# Patient Record
Sex: Male | Born: 1935
Health system: Southern US, Community
[De-identification: ages and names within clinical notes are randomized; demographics above are authoritative.]

## PROBLEM LIST (undated history)

## (undated) DIAGNOSIS — R627 Adult failure to thrive: Secondary | ICD-10-CM

## (undated) DIAGNOSIS — F419 Anxiety disorder, unspecified: Secondary | ICD-10-CM

## (undated) DIAGNOSIS — I471 Supraventricular tachycardia, unspecified: Secondary | ICD-10-CM

## (undated) DIAGNOSIS — E870 Hyperosmolality and hypernatremia: Secondary | ICD-10-CM

## (undated) DIAGNOSIS — N139 Obstructive and reflux uropathy, unspecified: Secondary | ICD-10-CM

## (undated) DIAGNOSIS — Z978 Presence of other specified devices: Secondary | ICD-10-CM

## (undated) DIAGNOSIS — N390 Urinary tract infection, site not specified: Secondary | ICD-10-CM

## (undated) DIAGNOSIS — C801 Malignant (primary) neoplasm, unspecified: Secondary | ICD-10-CM

## (undated) DIAGNOSIS — R6521 Severe sepsis with septic shock: Secondary | ICD-10-CM

## (undated) DIAGNOSIS — R319 Hematuria, unspecified: Secondary | ICD-10-CM

## (undated) DIAGNOSIS — K219 Gastro-esophageal reflux disease without esophagitis: Secondary | ICD-10-CM

## (undated) DIAGNOSIS — F0390 Unspecified dementia without behavioral disturbance: Secondary | ICD-10-CM

## (undated) DIAGNOSIS — I509 Heart failure, unspecified: Secondary | ICD-10-CM

## (undated) DIAGNOSIS — E871 Hypo-osmolality and hyponatremia: Secondary | ICD-10-CM

## (undated) DIAGNOSIS — E46 Unspecified protein-calorie malnutrition: Secondary | ICD-10-CM

## (undated) DIAGNOSIS — R1310 Dysphagia, unspecified: Secondary | ICD-10-CM

## (undated) DIAGNOSIS — R5381 Other malaise: Secondary | ICD-10-CM

## (undated) DIAGNOSIS — F32A Depression, unspecified: Secondary | ICD-10-CM

## (undated) DIAGNOSIS — D649 Anemia, unspecified: Secondary | ICD-10-CM

## (undated) DIAGNOSIS — N368 Other specified disorders of urethra: Secondary | ICD-10-CM

## (undated) DIAGNOSIS — N189 Chronic kidney disease, unspecified: Secondary | ICD-10-CM

## (undated) HISTORY — PX: TONSILECTOMY, ADENOIDECTOMY, BILATERAL MYRINGOTOMY AND TUBES: SHX2538

---

## 2016-06-28 DIAGNOSIS — R030 Elevated blood-pressure reading, without diagnosis of hypertension: Secondary | ICD-10-CM | POA: Diagnosis not present

## 2016-06-28 DIAGNOSIS — E538 Deficiency of other specified B group vitamins: Secondary | ICD-10-CM | POA: Diagnosis not present

## 2016-06-28 DIAGNOSIS — Z6834 Body mass index (BMI) 34.0-34.9, adult: Secondary | ICD-10-CM | POA: Diagnosis not present

## 2016-06-28 DIAGNOSIS — R3915 Urgency of urination: Secondary | ICD-10-CM | POA: Diagnosis not present

## 2016-06-28 DIAGNOSIS — E782 Mixed hyperlipidemia: Secondary | ICD-10-CM | POA: Diagnosis not present

## 2016-10-18 DIAGNOSIS — I10 Essential (primary) hypertension: Secondary | ICD-10-CM | POA: Diagnosis not present

## 2016-10-18 DIAGNOSIS — Z23 Encounter for immunization: Secondary | ICD-10-CM | POA: Diagnosis not present

## 2016-10-18 DIAGNOSIS — Z6834 Body mass index (BMI) 34.0-34.9, adult: Secondary | ICD-10-CM | POA: Diagnosis not present

## 2016-10-18 DIAGNOSIS — E6609 Other obesity due to excess calories: Secondary | ICD-10-CM | POA: Diagnosis not present

## 2018-01-08 DIAGNOSIS — E875 Hyperkalemia: Secondary | ICD-10-CM | POA: Diagnosis present

## 2018-01-08 DIAGNOSIS — E7800 Pure hypercholesterolemia, unspecified: Secondary | ICD-10-CM | POA: Diagnosis present

## 2018-01-08 DIAGNOSIS — R338 Other retention of urine: Secondary | ICD-10-CM | POA: Diagnosis present

## 2018-01-08 DIAGNOSIS — R05 Cough: Secondary | ICD-10-CM | POA: Diagnosis not present

## 2018-01-08 DIAGNOSIS — R339 Retention of urine, unspecified: Secondary | ICD-10-CM | POA: Diagnosis not present

## 2018-01-08 DIAGNOSIS — N320 Bladder-neck obstruction: Secondary | ICD-10-CM | POA: Diagnosis present

## 2018-01-08 DIAGNOSIS — E872 Acidosis: Secondary | ICD-10-CM | POA: Diagnosis present

## 2018-01-08 DIAGNOSIS — R600 Localized edema: Secondary | ICD-10-CM | POA: Diagnosis not present

## 2018-01-08 DIAGNOSIS — G9349 Other encephalopathy: Secondary | ICD-10-CM | POA: Diagnosis present

## 2018-01-08 DIAGNOSIS — R410 Disorientation, unspecified: Secondary | ICD-10-CM | POA: Diagnosis not present

## 2018-01-08 DIAGNOSIS — N179 Acute kidney failure, unspecified: Secondary | ICD-10-CM | POA: Diagnosis not present

## 2018-01-08 DIAGNOSIS — R32 Unspecified urinary incontinence: Secondary | ICD-10-CM | POA: Diagnosis present

## 2018-01-08 DIAGNOSIS — R41841 Cognitive communication deficit: Secondary | ICD-10-CM | POA: Diagnosis not present

## 2018-01-08 DIAGNOSIS — M6281 Muscle weakness (generalized): Secondary | ICD-10-CM | POA: Diagnosis not present

## 2018-01-08 DIAGNOSIS — R5383 Other fatigue: Secondary | ICD-10-CM | POA: Diagnosis not present

## 2018-01-08 DIAGNOSIS — N401 Enlarged prostate with lower urinary tract symptoms: Secondary | ICD-10-CM | POA: Diagnosis present

## 2018-01-08 DIAGNOSIS — E538 Deficiency of other specified B group vitamins: Secondary | ICD-10-CM | POA: Diagnosis not present

## 2018-01-08 DIAGNOSIS — E876 Hypokalemia: Secondary | ICD-10-CM | POA: Diagnosis present

## 2018-01-08 DIAGNOSIS — G9340 Encephalopathy, unspecified: Secondary | ICD-10-CM | POA: Diagnosis not present

## 2018-01-08 DIAGNOSIS — Z9660 Presence of unspecified orthopedic joint implant: Secondary | ICD-10-CM | POA: Diagnosis present

## 2018-01-08 DIAGNOSIS — D649 Anemia, unspecified: Secondary | ICD-10-CM | POA: Diagnosis present

## 2018-01-08 DIAGNOSIS — I10 Essential (primary) hypertension: Secondary | ICD-10-CM | POA: Diagnosis present

## 2018-01-08 DIAGNOSIS — R2689 Other abnormalities of gait and mobility: Secondary | ICD-10-CM | POA: Diagnosis not present

## 2018-01-08 DIAGNOSIS — N400 Enlarged prostate without lower urinary tract symptoms: Secondary | ICD-10-CM | POA: Diagnosis not present

## 2018-01-08 DIAGNOSIS — R278 Other lack of coordination: Secondary | ICD-10-CM | POA: Diagnosis not present

## 2018-01-08 DIAGNOSIS — N1330 Unspecified hydronephrosis: Secondary | ICD-10-CM | POA: Diagnosis present

## 2018-01-08 DIAGNOSIS — E785 Hyperlipidemia, unspecified: Secondary | ICD-10-CM | POA: Diagnosis not present

## 2018-01-08 DIAGNOSIS — D72829 Elevated white blood cell count, unspecified: Secondary | ICD-10-CM | POA: Diagnosis not present

## 2018-01-08 DIAGNOSIS — R4182 Altered mental status, unspecified: Secondary | ICD-10-CM | POA: Diagnosis not present

## 2018-01-08 DIAGNOSIS — R944 Abnormal results of kidney function studies: Secondary | ICD-10-CM | POA: Diagnosis not present

## 2018-01-12 DIAGNOSIS — R5383 Other fatigue: Secondary | ICD-10-CM | POA: Diagnosis not present

## 2018-01-12 DIAGNOSIS — N481 Balanitis: Secondary | ICD-10-CM | POA: Diagnosis not present

## 2018-01-12 DIAGNOSIS — M6281 Muscle weakness (generalized): Secondary | ICD-10-CM | POA: Diagnosis not present

## 2018-01-12 DIAGNOSIS — N472 Paraphimosis: Secondary | ICD-10-CM | POA: Diagnosis not present

## 2018-01-12 DIAGNOSIS — R41841 Cognitive communication deficit: Secondary | ICD-10-CM | POA: Diagnosis not present

## 2018-01-12 DIAGNOSIS — D649 Anemia, unspecified: Secondary | ICD-10-CM | POA: Diagnosis not present

## 2018-01-12 DIAGNOSIS — R2689 Other abnormalities of gait and mobility: Secondary | ICD-10-CM | POA: Diagnosis not present

## 2018-01-12 DIAGNOSIS — N320 Bladder-neck obstruction: Secondary | ICD-10-CM | POA: Diagnosis not present

## 2018-01-12 DIAGNOSIS — Z79899 Other long term (current) drug therapy: Secondary | ICD-10-CM | POA: Diagnosis not present

## 2018-01-12 DIAGNOSIS — E872 Acidosis: Secondary | ICD-10-CM | POA: Diagnosis not present

## 2018-01-12 DIAGNOSIS — N390 Urinary tract infection, site not specified: Secondary | ICD-10-CM | POA: Diagnosis not present

## 2018-01-12 DIAGNOSIS — N19 Unspecified kidney failure: Secondary | ICD-10-CM | POA: Diagnosis not present

## 2018-01-12 DIAGNOSIS — N179 Acute kidney failure, unspecified: Secondary | ICD-10-CM | POA: Diagnosis not present

## 2018-01-12 DIAGNOSIS — H2703 Aphakia, bilateral: Secondary | ICD-10-CM | POA: Diagnosis not present

## 2018-01-12 DIAGNOSIS — T83511A Infection and inflammatory reaction due to indwelling urethral catheter, initial encounter: Secondary | ICD-10-CM | POA: Diagnosis not present

## 2018-01-12 DIAGNOSIS — N139 Obstructive and reflux uropathy, unspecified: Secondary | ICD-10-CM | POA: Diagnosis not present

## 2018-01-12 DIAGNOSIS — R338 Other retention of urine: Secondary | ICD-10-CM | POA: Diagnosis not present

## 2018-01-12 DIAGNOSIS — R5381 Other malaise: Secondary | ICD-10-CM | POA: Diagnosis not present

## 2018-01-12 DIAGNOSIS — M79674 Pain in right toe(s): Secondary | ICD-10-CM | POA: Diagnosis not present

## 2018-01-12 DIAGNOSIS — N401 Enlarged prostate with lower urinary tract symptoms: Secondary | ICD-10-CM | POA: Diagnosis not present

## 2018-01-12 DIAGNOSIS — E7800 Pure hypercholesterolemia, unspecified: Secondary | ICD-10-CM | POA: Diagnosis not present

## 2018-01-12 DIAGNOSIS — M21372 Foot drop, left foot: Secondary | ICD-10-CM | POA: Diagnosis not present

## 2018-01-12 DIAGNOSIS — R8290 Unspecified abnormal findings in urine: Secondary | ICD-10-CM | POA: Diagnosis not present

## 2018-01-12 DIAGNOSIS — R339 Retention of urine, unspecified: Secondary | ICD-10-CM | POA: Diagnosis not present

## 2018-01-12 DIAGNOSIS — M79675 Pain in left toe(s): Secondary | ICD-10-CM | POA: Diagnosis not present

## 2018-01-12 DIAGNOSIS — R531 Weakness: Secondary | ICD-10-CM | POA: Diagnosis not present

## 2018-01-12 DIAGNOSIS — R410 Disorientation, unspecified: Secondary | ICD-10-CM | POA: Diagnosis not present

## 2018-01-12 DIAGNOSIS — Z125 Encounter for screening for malignant neoplasm of prostate: Secondary | ICD-10-CM | POA: Diagnosis not present

## 2018-01-12 DIAGNOSIS — R944 Abnormal results of kidney function studies: Secondary | ICD-10-CM | POA: Diagnosis not present

## 2018-01-12 DIAGNOSIS — R278 Other lack of coordination: Secondary | ICD-10-CM | POA: Diagnosis not present

## 2018-01-12 DIAGNOSIS — N400 Enlarged prostate without lower urinary tract symptoms: Secondary | ICD-10-CM | POA: Diagnosis not present

## 2018-01-12 DIAGNOSIS — B351 Tinea unguium: Secondary | ICD-10-CM | POA: Diagnosis not present

## 2018-01-12 DIAGNOSIS — N1330 Unspecified hydronephrosis: Secondary | ICD-10-CM | POA: Diagnosis not present

## 2018-01-12 DIAGNOSIS — E876 Hypokalemia: Secondary | ICD-10-CM | POA: Diagnosis not present

## 2018-01-12 DIAGNOSIS — I10 Essential (primary) hypertension: Secondary | ICD-10-CM | POA: Diagnosis not present

## 2018-01-15 DIAGNOSIS — R5381 Other malaise: Secondary | ICD-10-CM | POA: Diagnosis not present

## 2018-01-15 DIAGNOSIS — N320 Bladder-neck obstruction: Secondary | ICD-10-CM | POA: Diagnosis not present

## 2018-01-15 DIAGNOSIS — D649 Anemia, unspecified: Secondary | ICD-10-CM | POA: Diagnosis not present

## 2018-01-15 DIAGNOSIS — N400 Enlarged prostate without lower urinary tract symptoms: Secondary | ICD-10-CM | POA: Diagnosis not present

## 2018-01-18 DIAGNOSIS — M79674 Pain in right toe(s): Secondary | ICD-10-CM | POA: Diagnosis not present

## 2018-01-18 DIAGNOSIS — M21372 Foot drop, left foot: Secondary | ICD-10-CM | POA: Diagnosis not present

## 2018-01-18 DIAGNOSIS — B351 Tinea unguium: Secondary | ICD-10-CM | POA: Diagnosis not present

## 2018-01-18 DIAGNOSIS — M79675 Pain in left toe(s): Secondary | ICD-10-CM | POA: Diagnosis not present

## 2018-01-19 DIAGNOSIS — H2703 Aphakia, bilateral: Secondary | ICD-10-CM | POA: Diagnosis not present

## 2018-01-22 DIAGNOSIS — N401 Enlarged prostate with lower urinary tract symptoms: Secondary | ICD-10-CM | POA: Diagnosis not present

## 2018-01-22 DIAGNOSIS — Z79899 Other long term (current) drug therapy: Secondary | ICD-10-CM | POA: Diagnosis not present

## 2018-01-22 DIAGNOSIS — R339 Retention of urine, unspecified: Secondary | ICD-10-CM | POA: Diagnosis not present

## 2018-01-22 DIAGNOSIS — N179 Acute kidney failure, unspecified: Secondary | ICD-10-CM | POA: Diagnosis not present

## 2018-01-26 DIAGNOSIS — N390 Urinary tract infection, site not specified: Secondary | ICD-10-CM | POA: Diagnosis not present

## 2018-01-26 DIAGNOSIS — N19 Unspecified kidney failure: Secondary | ICD-10-CM | POA: Diagnosis not present

## 2018-01-26 DIAGNOSIS — T83511A Infection and inflammatory reaction due to indwelling urethral catheter, initial encounter: Secondary | ICD-10-CM | POA: Diagnosis not present

## 2018-01-26 DIAGNOSIS — N139 Obstructive and reflux uropathy, unspecified: Secondary | ICD-10-CM | POA: Diagnosis not present

## 2018-01-26 DIAGNOSIS — R531 Weakness: Secondary | ICD-10-CM | POA: Diagnosis not present

## 2018-01-31 DIAGNOSIS — R339 Retention of urine, unspecified: Secondary | ICD-10-CM | POA: Diagnosis not present

## 2018-01-31 DIAGNOSIS — N472 Paraphimosis: Secondary | ICD-10-CM | POA: Diagnosis not present

## 2018-01-31 DIAGNOSIS — R8290 Unspecified abnormal findings in urine: Secondary | ICD-10-CM | POA: Diagnosis not present

## 2018-01-31 DIAGNOSIS — N481 Balanitis: Secondary | ICD-10-CM | POA: Diagnosis not present

## 2018-02-14 DIAGNOSIS — N390 Urinary tract infection, site not specified: Secondary | ICD-10-CM | POA: Diagnosis not present

## 2018-03-06 DIAGNOSIS — R339 Retention of urine, unspecified: Secondary | ICD-10-CM | POA: Diagnosis not present

## 2018-03-06 DIAGNOSIS — Z125 Encounter for screening for malignant neoplasm of prostate: Secondary | ICD-10-CM | POA: Diagnosis not present

## 2018-03-06 DIAGNOSIS — N401 Enlarged prostate with lower urinary tract symptoms: Secondary | ICD-10-CM | POA: Diagnosis not present

## 2018-03-09 DIAGNOSIS — Z79899 Other long term (current) drug therapy: Secondary | ICD-10-CM | POA: Diagnosis not present

## 2018-03-14 DIAGNOSIS — R600 Localized edema: Secondary | ICD-10-CM | POA: Diagnosis not present

## 2018-04-02 DIAGNOSIS — C61 Malignant neoplasm of prostate: Secondary | ICD-10-CM | POA: Diagnosis not present

## 2018-04-02 DIAGNOSIS — R339 Retention of urine, unspecified: Secondary | ICD-10-CM | POA: Diagnosis not present

## 2018-04-02 DIAGNOSIS — R972 Elevated prostate specific antigen [PSA]: Secondary | ICD-10-CM | POA: Diagnosis not present

## 2018-04-02 DIAGNOSIS — Z743 Need for continuous supervision: Secondary | ICD-10-CM | POA: Diagnosis not present

## 2018-04-02 DIAGNOSIS — R279 Unspecified lack of coordination: Secondary | ICD-10-CM | POA: Diagnosis not present

## 2018-04-11 DIAGNOSIS — R339 Retention of urine, unspecified: Secondary | ICD-10-CM | POA: Diagnosis not present

## 2018-04-11 DIAGNOSIS — I700 Atherosclerosis of aorta: Secondary | ICD-10-CM | POA: Diagnosis not present

## 2018-04-11 DIAGNOSIS — N3289 Other specified disorders of bladder: Secondary | ICD-10-CM | POA: Diagnosis not present

## 2018-04-11 DIAGNOSIS — C61 Malignant neoplasm of prostate: Secondary | ICD-10-CM | POA: Diagnosis not present

## 2018-04-19 DIAGNOSIS — C61 Malignant neoplasm of prostate: Secondary | ICD-10-CM | POA: Diagnosis not present

## 2018-04-20 DIAGNOSIS — C61 Malignant neoplasm of prostate: Secondary | ICD-10-CM | POA: Diagnosis not present

## 2018-04-20 DIAGNOSIS — R600 Localized edema: Secondary | ICD-10-CM | POA: Diagnosis not present

## 2018-05-09 DIAGNOSIS — R339 Retention of urine, unspecified: Secondary | ICD-10-CM | POA: Diagnosis not present

## 2018-05-09 DIAGNOSIS — N390 Urinary tract infection, site not specified: Secondary | ICD-10-CM | POA: Diagnosis not present

## 2018-05-09 DIAGNOSIS — C61 Malignant neoplasm of prostate: Secondary | ICD-10-CM | POA: Diagnosis not present

## 2018-05-10 DIAGNOSIS — N390 Urinary tract infection, site not specified: Secondary | ICD-10-CM | POA: Diagnosis not present

## 2018-05-24 DIAGNOSIS — R339 Retention of urine, unspecified: Secondary | ICD-10-CM | POA: Diagnosis not present

## 2018-05-24 DIAGNOSIS — C61 Malignant neoplasm of prostate: Secondary | ICD-10-CM | POA: Diagnosis not present

## 2018-05-29 DIAGNOSIS — R339 Retention of urine, unspecified: Secondary | ICD-10-CM | POA: Diagnosis not present

## 2018-05-29 DIAGNOSIS — I10 Essential (primary) hypertension: Secondary | ICD-10-CM | POA: Diagnosis not present

## 2018-05-29 DIAGNOSIS — Z8546 Personal history of malignant neoplasm of prostate: Secondary | ICD-10-CM | POA: Diagnosis not present

## 2018-05-29 DIAGNOSIS — R338 Other retention of urine: Secondary | ICD-10-CM | POA: Diagnosis not present

## 2018-05-29 DIAGNOSIS — Z79899 Other long term (current) drug therapy: Secondary | ICD-10-CM | POA: Diagnosis not present

## 2018-05-29 DIAGNOSIS — E785 Hyperlipidemia, unspecified: Secondary | ICD-10-CM | POA: Diagnosis not present

## 2018-05-29 DIAGNOSIS — C61 Malignant neoplasm of prostate: Secondary | ICD-10-CM | POA: Diagnosis not present

## 2018-05-29 DIAGNOSIS — N401 Enlarged prostate with lower urinary tract symptoms: Secondary | ICD-10-CM | POA: Diagnosis not present

## 2018-05-29 DIAGNOSIS — E876 Hypokalemia: Secondary | ICD-10-CM | POA: Diagnosis not present

## 2018-05-30 DIAGNOSIS — E785 Hyperlipidemia, unspecified: Secondary | ICD-10-CM | POA: Diagnosis not present

## 2018-05-30 DIAGNOSIS — E876 Hypokalemia: Secondary | ICD-10-CM | POA: Diagnosis not present

## 2018-05-30 DIAGNOSIS — R339 Retention of urine, unspecified: Secondary | ICD-10-CM | POA: Diagnosis not present

## 2018-05-30 DIAGNOSIS — N401 Enlarged prostate with lower urinary tract symptoms: Secondary | ICD-10-CM | POA: Diagnosis not present

## 2018-05-30 DIAGNOSIS — I10 Essential (primary) hypertension: Secondary | ICD-10-CM | POA: Diagnosis not present

## 2018-05-30 DIAGNOSIS — C61 Malignant neoplasm of prostate: Secondary | ICD-10-CM | POA: Diagnosis not present

## 2018-05-30 DIAGNOSIS — Z01818 Encounter for other preprocedural examination: Secondary | ICD-10-CM | POA: Diagnosis not present

## 2018-06-04 DIAGNOSIS — Z79899 Other long term (current) drug therapy: Secondary | ICD-10-CM | POA: Diagnosis not present

## 2018-06-07 DIAGNOSIS — C61 Malignant neoplasm of prostate: Secondary | ICD-10-CM | POA: Diagnosis not present

## 2018-06-07 DIAGNOSIS — M25562 Pain in left knee: Secondary | ICD-10-CM | POA: Diagnosis not present

## 2018-06-07 DIAGNOSIS — F419 Anxiety disorder, unspecified: Secondary | ICD-10-CM | POA: Diagnosis not present

## 2018-06-07 DIAGNOSIS — R339 Retention of urine, unspecified: Secondary | ICD-10-CM | POA: Diagnosis not present

## 2018-06-20 DIAGNOSIS — N390 Urinary tract infection, site not specified: Secondary | ICD-10-CM | POA: Diagnosis not present

## 2018-06-21 DIAGNOSIS — Z79899 Other long term (current) drug therapy: Secondary | ICD-10-CM | POA: Diagnosis not present

## 2018-07-12 DIAGNOSIS — E785 Hyperlipidemia, unspecified: Secondary | ICD-10-CM | POA: Diagnosis not present

## 2018-07-27 DIAGNOSIS — R600 Localized edema: Secondary | ICD-10-CM | POA: Diagnosis not present

## 2018-08-01 DIAGNOSIS — R001 Bradycardia, unspecified: Secondary | ICD-10-CM | POA: Diagnosis not present

## 2018-08-01 DIAGNOSIS — I129 Hypertensive chronic kidney disease with stage 1 through stage 4 chronic kidney disease, or unspecified chronic kidney disease: Secondary | ICD-10-CM | POA: Diagnosis present

## 2018-08-01 DIAGNOSIS — N189 Chronic kidney disease, unspecified: Secondary | ICD-10-CM | POA: Diagnosis present

## 2018-08-01 DIAGNOSIS — A419 Sepsis, unspecified organism: Secondary | ICD-10-CM | POA: Diagnosis not present

## 2018-08-01 DIAGNOSIS — N320 Bladder-neck obstruction: Secondary | ICD-10-CM | POA: Diagnosis not present

## 2018-08-01 DIAGNOSIS — N390 Urinary tract infection, site not specified: Secondary | ICD-10-CM | POA: Diagnosis present

## 2018-08-01 DIAGNOSIS — Z8546 Personal history of malignant neoplasm of prostate: Secondary | ICD-10-CM | POA: Diagnosis not present

## 2018-08-01 DIAGNOSIS — G9341 Metabolic encephalopathy: Secondary | ICD-10-CM | POA: Diagnosis present

## 2018-08-01 DIAGNOSIS — R55 Syncope and collapse: Secondary | ICD-10-CM | POA: Diagnosis not present

## 2018-08-01 DIAGNOSIS — J984 Other disorders of lung: Secondary | ICD-10-CM | POA: Diagnosis not present

## 2018-08-01 DIAGNOSIS — F8081 Childhood onset fluency disorder: Secondary | ICD-10-CM | POA: Diagnosis present

## 2018-08-01 DIAGNOSIS — R4182 Altered mental status, unspecified: Secondary | ICD-10-CM | POA: Diagnosis not present

## 2018-08-01 DIAGNOSIS — R0989 Other specified symptoms and signs involving the circulatory and respiratory systems: Secondary | ICD-10-CM | POA: Diagnosis not present

## 2018-08-01 DIAGNOSIS — E785 Hyperlipidemia, unspecified: Secondary | ICD-10-CM | POA: Diagnosis present

## 2018-08-01 DIAGNOSIS — D649 Anemia, unspecified: Secondary | ICD-10-CM | POA: Diagnosis present

## 2018-08-01 DIAGNOSIS — I700 Atherosclerosis of aorta: Secondary | ICD-10-CM | POA: Diagnosis present

## 2018-08-01 DIAGNOSIS — A4159 Other Gram-negative sepsis: Secondary | ICD-10-CM | POA: Diagnosis present

## 2018-08-01 DIAGNOSIS — R0902 Hypoxemia: Secondary | ICD-10-CM | POA: Diagnosis not present

## 2018-08-01 DIAGNOSIS — Z960 Presence of urogenital implants: Secondary | ICD-10-CM | POA: Diagnosis not present

## 2018-08-01 DIAGNOSIS — T83511A Infection and inflammatory reaction due to indwelling urethral catheter, initial encounter: Secondary | ICD-10-CM | POA: Diagnosis present

## 2018-08-01 DIAGNOSIS — I959 Hypotension, unspecified: Secondary | ICD-10-CM | POA: Diagnosis not present

## 2018-08-10 DIAGNOSIS — A419 Sepsis, unspecified organism: Secondary | ICD-10-CM | POA: Diagnosis not present

## 2018-08-13 DIAGNOSIS — R972 Elevated prostate specific antigen [PSA]: Secondary | ICD-10-CM | POA: Diagnosis not present

## 2018-08-13 DIAGNOSIS — C61 Malignant neoplasm of prostate: Secondary | ICD-10-CM | POA: Diagnosis not present

## 2018-08-13 DIAGNOSIS — R339 Retention of urine, unspecified: Secondary | ICD-10-CM | POA: Diagnosis not present

## 2018-09-05 DIAGNOSIS — B351 Tinea unguium: Secondary | ICD-10-CM | POA: Diagnosis not present

## 2018-09-05 DIAGNOSIS — M79675 Pain in left toe(s): Secondary | ICD-10-CM | POA: Diagnosis not present

## 2018-09-05 DIAGNOSIS — M79674 Pain in right toe(s): Secondary | ICD-10-CM | POA: Diagnosis not present

## 2018-09-06 DIAGNOSIS — C61 Malignant neoplasm of prostate: Secondary | ICD-10-CM | POA: Diagnosis not present

## 2018-09-06 DIAGNOSIS — R600 Localized edema: Secondary | ICD-10-CM | POA: Diagnosis not present

## 2018-09-28 DIAGNOSIS — R600 Localized edema: Secondary | ICD-10-CM | POA: Diagnosis not present

## 2018-09-28 DIAGNOSIS — C61 Malignant neoplasm of prostate: Secondary | ICD-10-CM | POA: Diagnosis not present

## 2018-11-08 DIAGNOSIS — R600 Localized edema: Secondary | ICD-10-CM | POA: Diagnosis not present

## 2018-11-08 DIAGNOSIS — C61 Malignant neoplasm of prostate: Secondary | ICD-10-CM | POA: Diagnosis not present

## 2018-11-12 DIAGNOSIS — C61 Malignant neoplasm of prostate: Secondary | ICD-10-CM | POA: Diagnosis not present

## 2018-11-12 DIAGNOSIS — R339 Retention of urine, unspecified: Secondary | ICD-10-CM | POA: Diagnosis not present

## 2018-11-26 DIAGNOSIS — C61 Malignant neoplasm of prostate: Secondary | ICD-10-CM | POA: Diagnosis not present

## 2018-11-29 DIAGNOSIS — M79674 Pain in right toe(s): Secondary | ICD-10-CM | POA: Diagnosis not present

## 2018-11-29 DIAGNOSIS — Z510 Encounter for antineoplastic radiation therapy: Secondary | ICD-10-CM | POA: Diagnosis not present

## 2018-11-29 DIAGNOSIS — B351 Tinea unguium: Secondary | ICD-10-CM | POA: Diagnosis not present

## 2018-11-29 DIAGNOSIS — M79675 Pain in left toe(s): Secondary | ICD-10-CM | POA: Diagnosis not present

## 2018-11-29 DIAGNOSIS — C61 Malignant neoplasm of prostate: Secondary | ICD-10-CM | POA: Diagnosis not present

## 2018-12-13 DIAGNOSIS — C61 Malignant neoplasm of prostate: Secondary | ICD-10-CM | POA: Diagnosis not present

## 2018-12-13 DIAGNOSIS — R600 Localized edema: Secondary | ICD-10-CM | POA: Diagnosis not present

## 2018-12-17 DIAGNOSIS — Z79899 Other long term (current) drug therapy: Secondary | ICD-10-CM | POA: Diagnosis not present

## 2019-01-11 DIAGNOSIS — Z79899 Other long term (current) drug therapy: Secondary | ICD-10-CM | POA: Diagnosis not present

## 2019-01-11 DIAGNOSIS — N183 Chronic kidney disease, stage 3 (moderate): Secondary | ICD-10-CM | POA: Diagnosis not present

## 2019-01-17 DIAGNOSIS — C61 Malignant neoplasm of prostate: Secondary | ICD-10-CM | POA: Diagnosis not present

## 2019-01-17 DIAGNOSIS — R600 Localized edema: Secondary | ICD-10-CM | POA: Diagnosis not present

## 2019-03-13 DIAGNOSIS — Z20828 Contact with and (suspected) exposure to other viral communicable diseases: Secondary | ICD-10-CM | POA: Diagnosis not present

## 2019-03-13 DIAGNOSIS — Z1383 Encounter for screening for respiratory disorder NEC: Secondary | ICD-10-CM | POA: Diagnosis not present

## 2019-04-07 DIAGNOSIS — Z03818 Encounter for observation for suspected exposure to other biological agents ruled out: Secondary | ICD-10-CM | POA: Diagnosis not present

## 2019-04-12 DIAGNOSIS — Z20828 Contact with and (suspected) exposure to other viral communicable diseases: Secondary | ICD-10-CM | POA: Diagnosis not present

## 2019-04-19 DIAGNOSIS — Z20828 Contact with and (suspected) exposure to other viral communicable diseases: Secondary | ICD-10-CM | POA: Diagnosis not present

## 2019-04-29 DIAGNOSIS — R2689 Other abnormalities of gait and mobility: Secondary | ICD-10-CM | POA: Diagnosis not present

## 2019-04-29 DIAGNOSIS — R41841 Cognitive communication deficit: Secondary | ICD-10-CM | POA: Diagnosis not present

## 2019-04-29 DIAGNOSIS — I959 Hypotension, unspecified: Secondary | ICD-10-CM | POA: Diagnosis not present

## 2019-04-29 DIAGNOSIS — R338 Other retention of urine: Secondary | ICD-10-CM | POA: Diagnosis not present

## 2019-04-29 DIAGNOSIS — Z79899 Other long term (current) drug therapy: Secondary | ICD-10-CM | POA: Diagnosis not present

## 2019-04-29 DIAGNOSIS — N189 Chronic kidney disease, unspecified: Secondary | ICD-10-CM | POA: Diagnosis not present

## 2019-04-29 DIAGNOSIS — N139 Obstructive and reflux uropathy, unspecified: Secondary | ICD-10-CM | POA: Diagnosis not present

## 2019-04-29 DIAGNOSIS — Z7401 Bed confinement status: Secondary | ICD-10-CM | POA: Diagnosis not present

## 2019-04-29 DIAGNOSIS — M6281 Muscle weakness (generalized): Secondary | ICD-10-CM | POA: Diagnosis not present

## 2019-04-29 DIAGNOSIS — M2550 Pain in unspecified joint: Secondary | ICD-10-CM | POA: Diagnosis not present

## 2019-04-29 DIAGNOSIS — Z9181 History of falling: Secondary | ICD-10-CM | POA: Diagnosis not present

## 2019-04-29 DIAGNOSIS — J1289 Other viral pneumonia: Secondary | ICD-10-CM | POA: Diagnosis not present

## 2019-04-29 DIAGNOSIS — U071 COVID-19: Secondary | ICD-10-CM | POA: Diagnosis not present

## 2019-04-29 DIAGNOSIS — R4182 Altered mental status, unspecified: Secondary | ICD-10-CM | POA: Diagnosis not present

## 2019-04-29 DIAGNOSIS — N183 Chronic kidney disease, stage 3 (moderate): Secondary | ICD-10-CM | POA: Diagnosis not present

## 2019-04-29 DIAGNOSIS — R0989 Other specified symptoms and signs involving the circulatory and respiratory systems: Secondary | ICD-10-CM | POA: Diagnosis not present

## 2019-04-29 DIAGNOSIS — R404 Transient alteration of awareness: Secondary | ICD-10-CM | POA: Diagnosis not present

## 2019-04-29 DIAGNOSIS — Z960 Presence of urogenital implants: Secondary | ICD-10-CM | POA: Diagnosis not present

## 2019-04-29 DIAGNOSIS — I509 Heart failure, unspecified: Secondary | ICD-10-CM | POA: Diagnosis not present

## 2019-04-29 DIAGNOSIS — R0902 Hypoxemia: Secondary | ICD-10-CM | POA: Diagnosis not present

## 2019-04-29 DIAGNOSIS — Z466 Encounter for fitting and adjustment of urinary device: Secondary | ICD-10-CM | POA: Diagnosis not present

## 2019-04-29 DIAGNOSIS — E785 Hyperlipidemia, unspecified: Secondary | ICD-10-CM | POA: Diagnosis not present

## 2019-04-29 DIAGNOSIS — C61 Malignant neoplasm of prostate: Secondary | ICD-10-CM | POA: Diagnosis not present

## 2019-04-29 DIAGNOSIS — I130 Hypertensive heart and chronic kidney disease with heart failure and stage 1 through stage 4 chronic kidney disease, or unspecified chronic kidney disease: Secondary | ICD-10-CM | POA: Diagnosis not present

## 2019-04-29 DIAGNOSIS — N184 Chronic kidney disease, stage 4 (severe): Secondary | ICD-10-CM | POA: Diagnosis not present

## 2019-04-29 DIAGNOSIS — N390 Urinary tract infection, site not specified: Secondary | ICD-10-CM | POA: Diagnosis not present

## 2019-04-29 DIAGNOSIS — R2681 Unsteadiness on feet: Secondary | ICD-10-CM | POA: Diagnosis not present

## 2019-05-02 DIAGNOSIS — U071 COVID-19: Secondary | ICD-10-CM | POA: Diagnosis not present

## 2019-05-02 DIAGNOSIS — C61 Malignant neoplasm of prostate: Secondary | ICD-10-CM | POA: Diagnosis not present

## 2019-05-02 DIAGNOSIS — I509 Heart failure, unspecified: Secondary | ICD-10-CM | POA: Diagnosis not present

## 2019-05-02 DIAGNOSIS — N183 Chronic kidney disease, stage 3 (moderate): Secondary | ICD-10-CM | POA: Diagnosis not present

## 2019-05-02 DIAGNOSIS — N184 Chronic kidney disease, stage 4 (severe): Secondary | ICD-10-CM | POA: Diagnosis not present

## 2019-05-04 DIAGNOSIS — I130 Hypertensive heart and chronic kidney disease with heart failure and stage 1 through stage 4 chronic kidney disease, or unspecified chronic kidney disease: Secondary | ICD-10-CM | POA: Diagnosis not present

## 2019-05-04 DIAGNOSIS — I509 Heart failure, unspecified: Secondary | ICD-10-CM | POA: Diagnosis not present

## 2019-05-04 DIAGNOSIS — U071 COVID-19: Secondary | ICD-10-CM | POA: Diagnosis not present

## 2019-05-04 DIAGNOSIS — E785 Hyperlipidemia, unspecified: Secondary | ICD-10-CM | POA: Diagnosis not present

## 2019-05-04 DIAGNOSIS — J1289 Other viral pneumonia: Secondary | ICD-10-CM | POA: Diagnosis not present

## 2019-05-04 DIAGNOSIS — N189 Chronic kidney disease, unspecified: Secondary | ICD-10-CM | POA: Diagnosis not present

## 2019-05-07 DIAGNOSIS — J1289 Other viral pneumonia: Secondary | ICD-10-CM | POA: Diagnosis not present

## 2019-05-07 DIAGNOSIS — R0902 Hypoxemia: Secondary | ICD-10-CM | POA: Diagnosis not present

## 2019-05-07 DIAGNOSIS — R4182 Altered mental status, unspecified: Secondary | ICD-10-CM | POA: Diagnosis not present

## 2019-05-07 DIAGNOSIS — U071 COVID-19: Secondary | ICD-10-CM | POA: Diagnosis not present

## 2019-05-16 ENCOUNTER — Other Ambulatory Visit: Payer: Self-pay | Admitting: *Deleted

## 2019-05-16 NOTE — Patient Outreach (Signed)
Made aware by [facility] UM RN that member is currently receiving rehab therapy at [facility].   Member assessed for potential [facility] Care Management needs as a benefit of [place] [employer].  [facility] UM RN indicates that Mrs. Gautreau is a long term resident at [facility] and will likely transition back to long term care after skilled therapy has concluded.  Will plan to sign off for now since disposition plan is for long term care.    Marthenia Rolling, MSN-Ed, RN,BSN [place] Acute Care Coordinator [phone] [facility]) [phone]  (Toll free office)

## 2019-06-11 DIAGNOSIS — M6281 Muscle weakness (generalized): Secondary | ICD-10-CM | POA: Diagnosis not present

## 2019-06-11 DIAGNOSIS — N183 Chronic kidney disease, stage 3 (moderate): Secondary | ICD-10-CM | POA: Diagnosis not present

## 2019-06-11 DIAGNOSIS — N139 Obstructive and reflux uropathy, unspecified: Secondary | ICD-10-CM | POA: Diagnosis not present

## 2019-06-11 DIAGNOSIS — Z466 Encounter for fitting and adjustment of urinary device: Secondary | ICD-10-CM | POA: Diagnosis not present

## 2019-06-11 DIAGNOSIS — I509 Heart failure, unspecified: Secondary | ICD-10-CM | POA: Diagnosis not present

## 2019-06-11 DIAGNOSIS — Z960 Presence of urogenital implants: Secondary | ICD-10-CM | POA: Diagnosis not present

## 2019-06-11 DIAGNOSIS — R2689 Other abnormalities of gait and mobility: Secondary | ICD-10-CM | POA: Diagnosis not present

## 2019-06-11 DIAGNOSIS — R5383 Other fatigue: Secondary | ICD-10-CM | POA: Diagnosis not present

## 2019-06-11 DIAGNOSIS — N390 Urinary tract infection, site not specified: Secondary | ICD-10-CM | POA: Diagnosis not present

## 2019-06-11 DIAGNOSIS — R338 Other retention of urine: Secondary | ICD-10-CM | POA: Diagnosis not present

## 2019-06-11 DIAGNOSIS — C61 Malignant neoplasm of prostate: Secondary | ICD-10-CM | POA: Diagnosis not present

## 2019-06-11 DIAGNOSIS — R41841 Cognitive communication deficit: Secondary | ICD-10-CM | POA: Diagnosis not present

## 2019-06-11 DIAGNOSIS — R2681 Unsteadiness on feet: Secondary | ICD-10-CM | POA: Diagnosis not present

## 2019-06-11 DIAGNOSIS — Z9181 History of falling: Secondary | ICD-10-CM | POA: Diagnosis not present

## 2019-06-11 DIAGNOSIS — Z741 Need for assistance with personal care: Secondary | ICD-10-CM | POA: Diagnosis not present

## 2019-06-13 ENCOUNTER — Other Ambulatory Visit: Payer: Self-pay

## 2019-06-13 DIAGNOSIS — R41841 Cognitive communication deficit: Secondary | ICD-10-CM | POA: Diagnosis not present

## 2019-06-13 DIAGNOSIS — M6281 Muscle weakness (generalized): Secondary | ICD-10-CM | POA: Diagnosis not present

## 2019-06-13 DIAGNOSIS — Z9181 History of falling: Secondary | ICD-10-CM | POA: Diagnosis not present

## 2019-06-13 DIAGNOSIS — R2681 Unsteadiness on feet: Secondary | ICD-10-CM | POA: Diagnosis not present

## 2019-06-13 DIAGNOSIS — R2689 Other abnormalities of gait and mobility: Secondary | ICD-10-CM | POA: Diagnosis not present

## 2019-06-13 DIAGNOSIS — N390 Urinary tract infection, site not specified: Secondary | ICD-10-CM | POA: Diagnosis not present

## 2019-06-17 DIAGNOSIS — Z9181 History of falling: Secondary | ICD-10-CM | POA: Diagnosis not present

## 2019-06-17 DIAGNOSIS — Z960 Presence of urogenital implants: Secondary | ICD-10-CM | POA: Diagnosis not present

## 2019-06-17 DIAGNOSIS — C61 Malignant neoplasm of prostate: Secondary | ICD-10-CM | POA: Diagnosis not present

## 2019-06-17 DIAGNOSIS — R338 Other retention of urine: Secondary | ICD-10-CM | POA: Diagnosis not present

## 2019-06-17 DIAGNOSIS — R2681 Unsteadiness on feet: Secondary | ICD-10-CM | POA: Diagnosis not present

## 2019-06-17 DIAGNOSIS — R41841 Cognitive communication deficit: Secondary | ICD-10-CM | POA: Diagnosis not present

## 2019-06-17 DIAGNOSIS — N139 Obstructive and reflux uropathy, unspecified: Secondary | ICD-10-CM | POA: Diagnosis not present

## 2019-06-17 DIAGNOSIS — I509 Heart failure, unspecified: Secondary | ICD-10-CM | POA: Diagnosis not present

## 2019-06-17 DIAGNOSIS — N183 Chronic kidney disease, stage 3 (moderate): Secondary | ICD-10-CM | POA: Diagnosis not present

## 2019-06-17 DIAGNOSIS — R2689 Other abnormalities of gait and mobility: Secondary | ICD-10-CM | POA: Diagnosis not present

## 2019-06-17 DIAGNOSIS — Z741 Need for assistance with personal care: Secondary | ICD-10-CM | POA: Diagnosis not present

## 2019-06-17 DIAGNOSIS — R5383 Other fatigue: Secondary | ICD-10-CM | POA: Diagnosis not present

## 2019-06-17 DIAGNOSIS — Z466 Encounter for fitting and adjustment of urinary device: Secondary | ICD-10-CM | POA: Diagnosis not present

## 2019-06-17 DIAGNOSIS — M6281 Muscle weakness (generalized): Secondary | ICD-10-CM | POA: Diagnosis not present

## 2019-06-18 DIAGNOSIS — Z79899 Other long term (current) drug therapy: Secondary | ICD-10-CM | POA: Diagnosis not present

## 2019-06-19 DIAGNOSIS — R2689 Other abnormalities of gait and mobility: Secondary | ICD-10-CM | POA: Diagnosis not present

## 2019-06-19 DIAGNOSIS — R2681 Unsteadiness on feet: Secondary | ICD-10-CM | POA: Diagnosis not present

## 2019-06-19 DIAGNOSIS — R41841 Cognitive communication deficit: Secondary | ICD-10-CM | POA: Diagnosis not present

## 2019-06-19 DIAGNOSIS — Z9181 History of falling: Secondary | ICD-10-CM | POA: Diagnosis not present

## 2019-06-19 DIAGNOSIS — Z741 Need for assistance with personal care: Secondary | ICD-10-CM | POA: Diagnosis not present

## 2019-06-19 DIAGNOSIS — M6281 Muscle weakness (generalized): Secondary | ICD-10-CM | POA: Diagnosis not present

## 2019-06-21 DIAGNOSIS — M6281 Muscle weakness (generalized): Secondary | ICD-10-CM | POA: Diagnosis not present

## 2019-06-21 DIAGNOSIS — Z741 Need for assistance with personal care: Secondary | ICD-10-CM | POA: Diagnosis not present

## 2019-06-21 DIAGNOSIS — R2681 Unsteadiness on feet: Secondary | ICD-10-CM | POA: Diagnosis not present

## 2019-06-21 DIAGNOSIS — R2689 Other abnormalities of gait and mobility: Secondary | ICD-10-CM | POA: Diagnosis not present

## 2019-06-21 DIAGNOSIS — Z9181 History of falling: Secondary | ICD-10-CM | POA: Diagnosis not present

## 2019-06-21 DIAGNOSIS — R41841 Cognitive communication deficit: Secondary | ICD-10-CM | POA: Diagnosis not present

## 2019-06-24 DIAGNOSIS — M6281 Muscle weakness (generalized): Secondary | ICD-10-CM | POA: Diagnosis not present

## 2019-06-24 DIAGNOSIS — Z741 Need for assistance with personal care: Secondary | ICD-10-CM | POA: Diagnosis not present

## 2019-06-24 DIAGNOSIS — R2681 Unsteadiness on feet: Secondary | ICD-10-CM | POA: Diagnosis not present

## 2019-06-24 DIAGNOSIS — R41841 Cognitive communication deficit: Secondary | ICD-10-CM | POA: Diagnosis not present

## 2019-06-24 DIAGNOSIS — R2689 Other abnormalities of gait and mobility: Secondary | ICD-10-CM | POA: Diagnosis not present

## 2019-06-24 DIAGNOSIS — Z9181 History of falling: Secondary | ICD-10-CM | POA: Diagnosis not present

## 2019-06-25 DIAGNOSIS — Z741 Need for assistance with personal care: Secondary | ICD-10-CM | POA: Diagnosis not present

## 2019-06-25 DIAGNOSIS — R2681 Unsteadiness on feet: Secondary | ICD-10-CM | POA: Diagnosis not present

## 2019-06-25 DIAGNOSIS — M6281 Muscle weakness (generalized): Secondary | ICD-10-CM | POA: Diagnosis not present

## 2019-06-25 DIAGNOSIS — R41841 Cognitive communication deficit: Secondary | ICD-10-CM | POA: Diagnosis not present

## 2019-06-25 DIAGNOSIS — Z9181 History of falling: Secondary | ICD-10-CM | POA: Diagnosis not present

## 2019-06-25 DIAGNOSIS — R2689 Other abnormalities of gait and mobility: Secondary | ICD-10-CM | POA: Diagnosis not present

## 2019-06-26 DIAGNOSIS — Z741 Need for assistance with personal care: Secondary | ICD-10-CM | POA: Diagnosis not present

## 2019-06-26 DIAGNOSIS — R2689 Other abnormalities of gait and mobility: Secondary | ICD-10-CM | POA: Diagnosis not present

## 2019-06-26 DIAGNOSIS — R2681 Unsteadiness on feet: Secondary | ICD-10-CM | POA: Diagnosis not present

## 2019-06-26 DIAGNOSIS — U071 COVID-19: Secondary | ICD-10-CM | POA: Diagnosis not present

## 2019-06-26 DIAGNOSIS — R41841 Cognitive communication deficit: Secondary | ICD-10-CM | POA: Diagnosis not present

## 2019-06-26 DIAGNOSIS — C61 Malignant neoplasm of prostate: Secondary | ICD-10-CM | POA: Diagnosis not present

## 2019-06-26 DIAGNOSIS — M6281 Muscle weakness (generalized): Secondary | ICD-10-CM | POA: Diagnosis not present

## 2019-06-26 DIAGNOSIS — R339 Retention of urine, unspecified: Secondary | ICD-10-CM | POA: Diagnosis not present

## 2019-06-26 DIAGNOSIS — Z9181 History of falling: Secondary | ICD-10-CM | POA: Diagnosis not present

## 2019-07-01 DIAGNOSIS — Z741 Need for assistance with personal care: Secondary | ICD-10-CM | POA: Diagnosis not present

## 2019-07-01 DIAGNOSIS — Z9181 History of falling: Secondary | ICD-10-CM | POA: Diagnosis not present

## 2019-07-01 DIAGNOSIS — M6281 Muscle weakness (generalized): Secondary | ICD-10-CM | POA: Diagnosis not present

## 2019-07-01 DIAGNOSIS — R2681 Unsteadiness on feet: Secondary | ICD-10-CM | POA: Diagnosis not present

## 2019-07-01 DIAGNOSIS — R41841 Cognitive communication deficit: Secondary | ICD-10-CM | POA: Diagnosis not present

## 2019-07-01 DIAGNOSIS — R2689 Other abnormalities of gait and mobility: Secondary | ICD-10-CM | POA: Diagnosis not present

## 2019-07-02 DIAGNOSIS — M6281 Muscle weakness (generalized): Secondary | ICD-10-CM | POA: Diagnosis not present

## 2019-07-02 DIAGNOSIS — Z741 Need for assistance with personal care: Secondary | ICD-10-CM | POA: Diagnosis not present

## 2019-07-02 DIAGNOSIS — R2689 Other abnormalities of gait and mobility: Secondary | ICD-10-CM | POA: Diagnosis not present

## 2019-07-02 DIAGNOSIS — R41841 Cognitive communication deficit: Secondary | ICD-10-CM | POA: Diagnosis not present

## 2019-07-02 DIAGNOSIS — Z9181 History of falling: Secondary | ICD-10-CM | POA: Diagnosis not present

## 2019-07-02 DIAGNOSIS — R2681 Unsteadiness on feet: Secondary | ICD-10-CM | POA: Diagnosis not present

## 2019-07-15 DIAGNOSIS — C61 Malignant neoplasm of prostate: Secondary | ICD-10-CM | POA: Diagnosis not present

## 2019-08-26 DIAGNOSIS — Z20828 Contact with and (suspected) exposure to other viral communicable diseases: Secondary | ICD-10-CM | POA: Diagnosis not present

## 2019-09-02 DIAGNOSIS — Z20828 Contact with and (suspected) exposure to other viral communicable diseases: Secondary | ICD-10-CM | POA: Diagnosis not present

## 2019-09-09 DIAGNOSIS — Z20828 Contact with and (suspected) exposure to other viral communicable diseases: Secondary | ICD-10-CM | POA: Diagnosis not present

## 2019-09-17 DIAGNOSIS — N390 Urinary tract infection, site not specified: Secondary | ICD-10-CM | POA: Diagnosis not present

## 2019-09-23 DIAGNOSIS — Z20828 Contact with and (suspected) exposure to other viral communicable diseases: Secondary | ICD-10-CM | POA: Diagnosis not present

## 2019-10-03 DIAGNOSIS — A419 Sepsis, unspecified organism: Secondary | ICD-10-CM | POA: Diagnosis not present

## 2019-10-03 DIAGNOSIS — E785 Hyperlipidemia, unspecified: Secondary | ICD-10-CM | POA: Diagnosis present

## 2019-10-03 DIAGNOSIS — R7989 Other specified abnormal findings of blood chemistry: Secondary | ICD-10-CM | POA: Diagnosis not present

## 2019-10-03 DIAGNOSIS — Z8546 Personal history of malignant neoplasm of prostate: Secondary | ICD-10-CM | POA: Diagnosis not present

## 2019-10-03 DIAGNOSIS — D62 Acute posthemorrhagic anemia: Secondary | ICD-10-CM | POA: Diagnosis present

## 2019-10-03 DIAGNOSIS — R0600 Dyspnea, unspecified: Secondary | ICD-10-CM | POA: Diagnosis not present

## 2019-10-03 DIAGNOSIS — R0902 Hypoxemia: Secondary | ICD-10-CM | POA: Diagnosis not present

## 2019-10-03 DIAGNOSIS — I509 Heart failure, unspecified: Secondary | ICD-10-CM | POA: Diagnosis present

## 2019-10-03 DIAGNOSIS — R58 Hemorrhage, not elsewhere classified: Secondary | ICD-10-CM | POA: Diagnosis not present

## 2019-10-03 DIAGNOSIS — Z79899 Other long term (current) drug therapy: Secondary | ICD-10-CM | POA: Diagnosis not present

## 2019-10-03 DIAGNOSIS — R509 Fever, unspecified: Secondary | ICD-10-CM | POA: Diagnosis not present

## 2019-10-03 DIAGNOSIS — F0390 Unspecified dementia without behavioral disturbance: Secondary | ICD-10-CM | POA: Diagnosis present

## 2019-10-03 DIAGNOSIS — Q541 Hypospadias, penile: Secondary | ICD-10-CM | POA: Diagnosis not present

## 2019-10-03 DIAGNOSIS — N390 Urinary tract infection, site not specified: Secondary | ICD-10-CM | POA: Diagnosis present

## 2019-10-03 DIAGNOSIS — R319 Hematuria, unspecified: Secondary | ICD-10-CM | POA: Diagnosis not present

## 2019-10-03 DIAGNOSIS — Z1624 Resistance to multiple antibiotics: Secondary | ICD-10-CM | POA: Diagnosis present

## 2019-10-03 DIAGNOSIS — I272 Pulmonary hypertension, unspecified: Secondary | ICD-10-CM | POA: Diagnosis present

## 2019-10-03 DIAGNOSIS — Z8619 Personal history of other infectious and parasitic diseases: Secondary | ICD-10-CM | POA: Diagnosis not present

## 2019-10-03 DIAGNOSIS — I959 Hypotension, unspecified: Secondary | ICD-10-CM | POA: Diagnosis not present

## 2019-10-03 DIAGNOSIS — Z6829 Body mass index (BMI) 29.0-29.9, adult: Secondary | ICD-10-CM | POA: Diagnosis not present

## 2019-10-03 DIAGNOSIS — I248 Other forms of acute ischemic heart disease: Secondary | ICD-10-CM | POA: Diagnosis present

## 2019-10-03 DIAGNOSIS — N401 Enlarged prostate with lower urinary tract symptoms: Secondary | ICD-10-CM | POA: Diagnosis present

## 2019-10-03 DIAGNOSIS — R404 Transient alteration of awareness: Secondary | ICD-10-CM | POA: Diagnosis not present

## 2019-10-03 DIAGNOSIS — I130 Hypertensive heart and chronic kidney disease with heart failure and stage 1 through stage 4 chronic kidney disease, or unspecified chronic kidney disease: Secondary | ICD-10-CM | POA: Diagnosis present

## 2019-10-03 DIAGNOSIS — T8383XA Hemorrhage of genitourinary prosthetic devices, implants and grafts, initial encounter: Secondary | ICD-10-CM | POA: Diagnosis present

## 2019-10-03 DIAGNOSIS — N183 Chronic kidney disease, stage 3 unspecified: Secondary | ICD-10-CM | POA: Diagnosis present

## 2019-10-03 DIAGNOSIS — Z8545 Personal history of malignant neoplasm of unspecified male genital organ: Secondary | ICD-10-CM | POA: Diagnosis not present

## 2019-10-03 DIAGNOSIS — R6521 Severe sepsis with septic shock: Secondary | ICD-10-CM | POA: Diagnosis present

## 2019-10-03 DIAGNOSIS — N189 Chronic kidney disease, unspecified: Secondary | ICD-10-CM | POA: Diagnosis not present

## 2019-10-03 DIAGNOSIS — T83028A Displacement of other indwelling urethral catheter, initial encounter: Secondary | ICD-10-CM | POA: Diagnosis present

## 2019-10-03 DIAGNOSIS — A4159 Other Gram-negative sepsis: Secondary | ICD-10-CM | POA: Diagnosis present

## 2019-10-03 DIAGNOSIS — Z960 Presence of urogenital implants: Secondary | ICD-10-CM | POA: Diagnosis not present

## 2019-10-09 DIAGNOSIS — Z79899 Other long term (current) drug therapy: Secondary | ICD-10-CM | POA: Diagnosis not present

## 2019-10-09 DIAGNOSIS — N390 Urinary tract infection, site not specified: Secondary | ICD-10-CM | POA: Diagnosis not present

## 2019-10-09 DIAGNOSIS — Z8546 Personal history of malignant neoplasm of prostate: Secondary | ICD-10-CM | POA: Diagnosis not present

## 2019-10-09 DIAGNOSIS — A419 Sepsis, unspecified organism: Secondary | ICD-10-CM | POA: Diagnosis not present

## 2019-10-09 DIAGNOSIS — R6521 Severe sepsis with septic shock: Secondary | ICD-10-CM | POA: Diagnosis not present

## 2019-10-09 DIAGNOSIS — Z960 Presence of urogenital implants: Secondary | ICD-10-CM | POA: Diagnosis not present

## 2019-10-09 DIAGNOSIS — F0390 Unspecified dementia without behavioral disturbance: Secondary | ICD-10-CM | POA: Diagnosis not present

## 2019-10-09 DIAGNOSIS — R319 Hematuria, unspecified: Secondary | ICD-10-CM | POA: Diagnosis not present

## 2019-10-09 DIAGNOSIS — Z8619 Personal history of other infectious and parasitic diseases: Secondary | ICD-10-CM | POA: Diagnosis not present

## 2019-10-09 DIAGNOSIS — I509 Heart failure, unspecified: Secondary | ICD-10-CM | POA: Diagnosis not present

## 2019-10-09 DIAGNOSIS — R262 Difficulty in walking, not elsewhere classified: Secondary | ICD-10-CM | POA: Diagnosis not present

## 2019-10-09 DIAGNOSIS — D649 Anemia, unspecified: Secondary | ICD-10-CM | POA: Diagnosis not present

## 2019-10-09 DIAGNOSIS — R7989 Other specified abnormal findings of blood chemistry: Secondary | ICD-10-CM | POA: Diagnosis not present

## 2019-10-09 DIAGNOSIS — D62 Acute posthemorrhagic anemia: Secondary | ICD-10-CM | POA: Diagnosis not present

## 2019-10-09 DIAGNOSIS — Z8545 Personal history of malignant neoplasm of unspecified male genital organ: Secondary | ICD-10-CM | POA: Diagnosis not present

## 2019-10-09 DIAGNOSIS — N183 Chronic kidney disease, stage 3 unspecified: Secondary | ICD-10-CM | POA: Diagnosis not present

## 2019-10-09 DIAGNOSIS — N189 Chronic kidney disease, unspecified: Secondary | ICD-10-CM | POA: Diagnosis not present

## 2019-10-14 DIAGNOSIS — D649 Anemia, unspecified: Secondary | ICD-10-CM | POA: Diagnosis not present

## 2019-10-14 DIAGNOSIS — R262 Difficulty in walking, not elsewhere classified: Secondary | ICD-10-CM | POA: Diagnosis not present

## 2019-10-14 DIAGNOSIS — N390 Urinary tract infection, site not specified: Secondary | ICD-10-CM | POA: Diagnosis not present

## 2019-10-14 DIAGNOSIS — N183 Chronic kidney disease, stage 3 unspecified: Secondary | ICD-10-CM | POA: Diagnosis not present

## 2019-10-17 DIAGNOSIS — Z20828 Contact with and (suspected) exposure to other viral communicable diseases: Secondary | ICD-10-CM | POA: Diagnosis not present

## 2021-06-09 DIAGNOSIS — A419 Sepsis, unspecified organism: Secondary | ICD-10-CM

## 2021-08-17 ENCOUNTER — Encounter (HOSPITAL_COMMUNITY): Payer: Self-pay

## 2021-08-17 ENCOUNTER — Emergency Department (HOSPITAL_COMMUNITY): Payer: Medicare Other

## 2021-08-17 ENCOUNTER — Inpatient Hospital Stay (HOSPITAL_COMMUNITY)
Admission: EM | Admit: 2021-08-17 | Discharge: 2021-09-06 | DRG: 662 | Disposition: A | Discharge status: Rehabilitation Facility | Payer: Medicare Other | Source: Skilled Nursing Facility | Attending: Internal Medicine | Admitting: Internal Medicine

## 2021-08-17 ENCOUNTER — Other Ambulatory Visit: Payer: Self-pay

## 2021-08-17 DIAGNOSIS — R0602 Shortness of breath: Secondary | ICD-10-CM

## 2021-08-17 DIAGNOSIS — I130 Hypertensive heart and chronic kidney disease with heart failure and stage 1 through stage 4 chronic kidney disease, or unspecified chronic kidney disease: Secondary | ICD-10-CM | POA: Diagnosis present

## 2021-08-17 DIAGNOSIS — N179 Acute kidney failure, unspecified: Secondary | ICD-10-CM | POA: Diagnosis present

## 2021-08-17 DIAGNOSIS — D539 Nutritional anemia, unspecified: Secondary | ICD-10-CM | POA: Diagnosis present

## 2021-08-17 DIAGNOSIS — Z9981 Dependence on supplemental oxygen: Secondary | ICD-10-CM

## 2021-08-17 DIAGNOSIS — Z7401 Bed confinement status: Secondary | ICD-10-CM

## 2021-08-17 DIAGNOSIS — N136 Pyonephrosis: Secondary | ICD-10-CM | POA: Diagnosis present

## 2021-08-17 DIAGNOSIS — N189 Chronic kidney disease, unspecified: Secondary | ICD-10-CM

## 2021-08-17 DIAGNOSIS — T83518A Infection and inflammatory reaction due to other urinary catheter, initial encounter: Secondary | ICD-10-CM | POA: Diagnosis not present

## 2021-08-17 DIAGNOSIS — Z419 Encounter for procedure for purposes other than remedying health state, unspecified: Secondary | ICD-10-CM

## 2021-08-17 DIAGNOSIS — N320 Bladder-neck obstruction: Secondary | ICD-10-CM | POA: Diagnosis present

## 2021-08-17 DIAGNOSIS — I471 Supraventricular tachycardia, unspecified: Secondary | ICD-10-CM | POA: Clinically undetermined

## 2021-08-17 DIAGNOSIS — R0902 Hypoxemia: Secondary | ICD-10-CM

## 2021-08-17 DIAGNOSIS — E870 Hyperosmolality and hypernatremia: Secondary | ICD-10-CM | POA: Diagnosis present

## 2021-08-17 DIAGNOSIS — R578 Other shock: Secondary | ICD-10-CM | POA: Diagnosis present

## 2021-08-17 DIAGNOSIS — Z6827 Body mass index (BMI) 27.0-27.9, adult: Secondary | ICD-10-CM

## 2021-08-17 DIAGNOSIS — D696 Thrombocytopenia, unspecified: Secondary | ICD-10-CM | POA: Diagnosis present

## 2021-08-17 DIAGNOSIS — E538 Deficiency of other specified B group vitamins: Secondary | ICD-10-CM | POA: Diagnosis present

## 2021-08-17 DIAGNOSIS — F0390 Unspecified dementia without behavioral disturbance: Secondary | ICD-10-CM | POA: Diagnosis present

## 2021-08-17 DIAGNOSIS — C61 Malignant neoplasm of prostate: Secondary | ICD-10-CM | POA: Diagnosis present

## 2021-08-17 DIAGNOSIS — Z781 Physical restraint status: Secondary | ICD-10-CM

## 2021-08-17 DIAGNOSIS — N1831 Chronic kidney disease, stage 3a: Secondary | ICD-10-CM | POA: Diagnosis present

## 2021-08-17 DIAGNOSIS — Z8616 Personal history of COVID-19: Secondary | ICD-10-CM

## 2021-08-17 DIAGNOSIS — D62 Acute posthemorrhagic anemia: Secondary | ICD-10-CM | POA: Diagnosis present

## 2021-08-17 DIAGNOSIS — E876 Hypokalemia: Secondary | ICD-10-CM | POA: Diagnosis not present

## 2021-08-17 DIAGNOSIS — N1832 Chronic kidney disease, stage 3b: Secondary | ICD-10-CM | POA: Diagnosis present

## 2021-08-17 DIAGNOSIS — J189 Pneumonia, unspecified organism: Secondary | ICD-10-CM | POA: Diagnosis present

## 2021-08-17 DIAGNOSIS — R627 Adult failure to thrive: Secondary | ICD-10-CM | POA: Diagnosis present

## 2021-08-17 DIAGNOSIS — N390 Urinary tract infection, site not specified: Secondary | ICD-10-CM | POA: Diagnosis present

## 2021-08-17 DIAGNOSIS — R310 Gross hematuria: Secondary | ICD-10-CM | POA: Diagnosis present

## 2021-08-17 DIAGNOSIS — D494 Neoplasm of unspecified behavior of bladder: Secondary | ICD-10-CM | POA: Diagnosis present

## 2021-08-17 DIAGNOSIS — Y846 Urinary catheterization as the cause of abnormal reaction of the patient, or of later complication, without mention of misadventure at the time of the procedure: Secondary | ICD-10-CM | POA: Diagnosis present

## 2021-08-17 DIAGNOSIS — R1310 Dysphagia, unspecified: Secondary | ICD-10-CM

## 2021-08-17 DIAGNOSIS — J9601 Acute respiratory failure with hypoxia: Secondary | ICD-10-CM | POA: Diagnosis present

## 2021-08-17 DIAGNOSIS — R54 Age-related physical debility: Secondary | ICD-10-CM | POA: Diagnosis present

## 2021-08-17 DIAGNOSIS — Z923 Personal history of irradiation: Secondary | ICD-10-CM

## 2021-08-17 DIAGNOSIS — D638 Anemia in other chronic diseases classified elsewhere: Secondary | ICD-10-CM | POA: Diagnosis present

## 2021-08-17 DIAGNOSIS — N135 Crossing vessel and stricture of ureter without hydronephrosis: Secondary | ICD-10-CM | POA: Diagnosis present

## 2021-08-17 DIAGNOSIS — A419 Sepsis, unspecified organism: Secondary | ICD-10-CM | POA: Diagnosis present

## 2021-08-17 DIAGNOSIS — Z0189 Encounter for other specified special examinations: Secondary | ICD-10-CM

## 2021-08-17 DIAGNOSIS — C7951 Secondary malignant neoplasm of bone: Secondary | ICD-10-CM | POA: Diagnosis present

## 2021-08-17 DIAGNOSIS — J159 Unspecified bacterial pneumonia: Secondary | ICD-10-CM | POA: Diagnosis present

## 2021-08-17 DIAGNOSIS — R069 Unspecified abnormalities of breathing: Secondary | ICD-10-CM

## 2021-08-17 DIAGNOSIS — E872 Acidosis, unspecified: Secondary | ICD-10-CM | POA: Diagnosis present

## 2021-08-17 DIAGNOSIS — N368 Other specified disorders of urethra: Secondary | ICD-10-CM | POA: Diagnosis present

## 2021-08-17 DIAGNOSIS — I714 Abdominal aortic aneurysm, without rupture, unspecified: Secondary | ICD-10-CM | POA: Diagnosis present

## 2021-08-17 DIAGNOSIS — E440 Moderate protein-calorie malnutrition: Secondary | ICD-10-CM | POA: Diagnosis present

## 2021-08-17 DIAGNOSIS — D631 Anemia in chronic kidney disease: Secondary | ICD-10-CM | POA: Diagnosis present

## 2021-08-17 DIAGNOSIS — R6520 Severe sepsis without septic shock: Secondary | ICD-10-CM | POA: Diagnosis present

## 2021-08-17 DIAGNOSIS — Z823 Family history of stroke: Secondary | ICD-10-CM

## 2021-08-17 DIAGNOSIS — Z4659 Encounter for fitting and adjustment of other gastrointestinal appliance and device: Secondary | ICD-10-CM

## 2021-08-17 DIAGNOSIS — I5033 Acute on chronic diastolic (congestive) heart failure: Secondary | ICD-10-CM | POA: Diagnosis present

## 2021-08-17 LAB — BASIC METABOLIC PANEL
Anion gap: 13 (ref 5–15)
BUN: 79 mg/dL — ABNORMAL HIGH (ref 8–23)
CO2: 17 mmol/L — ABNORMAL LOW (ref 22–32)
Calcium: 9 mg/dL (ref 8.9–10.3)
Chloride: 110 mmol/L (ref 98–111)
Creatinine, Ser: 5.8 mg/dL — ABNORMAL HIGH (ref 0.61–1.24)
GFR, Estimated: 9 mL/min — ABNORMAL LOW (ref >60)
Glucose, Bld: 148 mg/dL — ABNORMAL HIGH (ref 70–99)
Potassium: 4.3 mmol/L (ref 3.5–5.1)
Sodium: 140 mmol/L (ref 135–145)

## 2021-08-17 LAB — CBC WITH DIFFERENTIAL/PLATELET
Abs Immature Granulocytes: 0.07 10*3/uL (ref 0.00–0.07)
Basophils Absolute: 0 10*3/uL (ref 0.0–0.1)
Basophils Relative: 0 %
Eosinophils Absolute: 0.3 10*3/uL (ref 0.0–0.5)
Eosinophils Relative: 3 %
HCT: 27.6 % — ABNORMAL LOW (ref 39.0–52.0)
Hemoglobin: 8.6 g/dL — ABNORMAL LOW (ref 13.0–17.0)
Immature Granulocytes: 1 %
Lymphocytes Relative: 23 %
Lymphs Abs: 2.7 10*3/uL (ref 0.7–4.0)
MCH: 31.2 pg (ref 26.0–34.0)
MCHC: 31.2 g/dL (ref 30.0–36.0)
MCV: 100 fL (ref 80.0–100.0)
Monocytes Absolute: 0.6 10*3/uL (ref 0.1–1.0)
Monocytes Relative: 6 %
Neutro Abs: 7.7 10*3/uL (ref 1.7–7.7)
Neutrophils Relative %: 67 %
Platelets: 131 10*3/uL — ABNORMAL LOW (ref 150–400)
RBC: 2.76 MIL/uL — ABNORMAL LOW (ref 4.22–5.81)
RDW: 17.2 % — ABNORMAL HIGH (ref 11.5–15.5)
WBC: 11.4 10*3/uL — ABNORMAL HIGH (ref 4.0–10.5)
nRBC: 0 % (ref 0.0–0.2)

## 2021-08-17 MED ORDER — SODIUM CHLORIDE 0.9 % IV BOLUS
500.0000 mL | Freq: Once | INTRAVENOUS | Status: AC
  Administered 2021-08-17: 500 mL via INTRAVENOUS

## 2021-08-17 NOTE — ED Provider Notes (Signed)
[facility] EMERGENCY DEPARTMENT Provider Note   CSN: [id number] Arrival date & time: 08/17/21  1415     History Chief Complaint  Patient presents with   Acute Renal Failure   Abnormal Lab    LEVEL 5 CAVEAT 2/2 DEMENTIA  Anthony Dudley is a 87 y.o. male.  87 y/o male presents from [place] for elevated creatinine.  Patient reportedly with upward trending of creatinine since 07/09/21. Creatinine was 2.59 which went to 5.75 on 08/09/21 >> 6.18 on 08/16/21. Sent to the ED due to concern that patient required initiation of dialysis. He apparently had a drop in his Hgb to 6.6 on 08/10/21 and was transfused with 2 units PRBCs at [facility]. It is unclear if he was admitted and whether his trending creatinine was addressed in some way. The patient today has no complaints and denies pain. He has a history of unspecified heart failure, CKD stage 3, chronic indwelling Foley, dementia. Bedbound status at baseline.  The history is provided by the patient. No language interpreter was used.  Abnormal Lab     History reviewed. No pertinent past medical history.  There are no problems to display for this patient.   History reviewed. No pertinent surgical history.     No family history on file.     Home Medications Prior to Admission medications   Not on File    Allergies    Patient has no allergy information on record.  Review of Systems   Review of Systems  Unable to perform ROS: Dementia    Physical Exam Updated Vital Signs BP 124/76   Pulse (!) 109   Temp 99.5 F (37.5 C) (Rectal)   Resp (!) 27   SpO2 98%   Physical Exam Vitals and nursing note reviewed.  Constitutional:      Appearance: He is not toxic-appearing or diaphoretic.     Comments: Chronically ill appearing, in NAD  HENT:     Head: Normocephalic.     Right Ear: External ear normal.     Left Ear: External ear normal.  Eyes:     Conjunctiva/sclera:  Conjunctivae normal.     Comments: Teardrop pupil on the right, nonreactive. Pupil 82mm on the left, reactive to light.  Cardiovascular:     Rate and Rhythm: Regular rhythm. Tachycardia present.     Pulses: Normal pulses.  Pulmonary:     Effort: No respiratory distress.     Breath sounds: No stridor. No wheezing.     Comments: Respirations even and unlabored Abdominal:     Palpations: Abdomen is soft.     Tenderness: There is no abdominal tenderness.     Comments: Abdomen soft, nondistended, nontender  Genitourinary:    Comments: Chronic foley catheter Musculoskeletal:     Comments: No BLE edema  Neurological:     Mental Status: He is alert.     Comments: Alert and answers questions, but hard to understand due to absent dentition and articulation. Speech is not slurred. Bedbound at baseline.    ED Results / Procedures / Treatments   Labs (all labs ordered are listed, but only abnormal results are displayed) Labs Reviewed  CBC WITH DIFFERENTIAL/PLATELET - Abnormal; Notable for the following components:      Result Value   WBC 11.4 (*)    RBC 2.76 (*)    Hemoglobin 8.6 (*)    HCT 27.6 (*)    RDW 17.2 (*)    Platelets 131 (*)  All other components within normal limits  URINALYSIS, ROUTINE W REFLEX MICROSCOPIC - Abnormal; Notable for the following components:   APPearance CLOUDY (*)    Hgb urine dipstick LARGE (*)    Protein, ur 100 (*)    Leukocytes,Ua LARGE (*)    WBC, UA >50 (*)    Bacteria, UA MANY (*)    Non Squamous Epithelial 0-5 (*)    All other components within normal limits  BASIC METABOLIC PANEL - Abnormal; Notable for the following components:   CO2 17 (*)    Glucose, Bld 148 (*)    BUN 79 (*)    Creatinine, Ser 5.80 (*)    GFR, Estimated 9 (*)    All other components within normal limits  RESP PANEL BY RT-PCR (FLU A&B, COVID) [id number]  URINE CULTURE  TYPE AND SCREEN  ABO/RH    EKG None  Radiology US Renal  Result Date: 08/18/2021 CLINICAL DATA:   Renal failure EXAM: RENAL / URINARY TRACT ULTRASOUND COMPLETE COMPARISON:  06/06/2021 FINDINGS: Right Kidney: Renal measurements: 10.2 x 5.3 x 5.3 cm. = volume: 150 mL. Moderate hydronephrosis is noted. This has progressed slightly in the interval from the prior CT. Previously seen right renal calculus is not well appreciated on today's exam. Left Kidney: Renal measurements: 9.8 x 5.5 x 4.5 cm. = volume: 128 mL. Moderate left-sided hydronephrosis is noted as well stable in appearance from prior CT. Previously seen renal calculus is not well appreciated on today's exam. Bladder: Foley catheter is noted within the bladder. Other: None. IMPRESSION: Bilateral hydronephrosis left slightly greater than right similar to that seen on prior CT examination. Previously noted renal calculi bilaterally are not well appreciated on today's exam. Electronically Signed   By: Inez Catalina M.D.   On: 08/18/2021 00:30   DG Chest Port 1 View  Result Date: 08/17/2021 CLINICAL DATA:  Shortness of breath EXAM: PORTABLE CHEST 1 VIEW COMPARISON:  06/06/2021, CT 06/06/2021 FINDINGS: Chronic scarring in the left upper lobe. No acute airspace disease or pleural effusion. Stable cardiomediastinal silhouette with aortic atherosclerosis. Patchy bilateral skeletal sclerosis consistent with osseous metastatic disease. IMPRESSION: 1. Scarring in the left upper lobe.  No acute airspace disease. 2. Diffuse rib sclerosis consistent with skeletal metastatic disease Electronically Signed   By: Donavan Foil M.D.   On: 08/17/2021 23:41    Procedures Procedures   Medications Ordered in ED Medications  ceFEPIme (MAXIPIME) 1 g in sodium chloride 0.9 % 100 mL IVPB (has no administration in time range)  sodium chloride 0.9 % bolus 500 mL (0 mLs Intravenous Stopped 08/18/21 0037)    ED Course  I have reviewed the triage vital signs and the nursing notes.  Pertinent labs & imaging results that were available during my care of the patient were  reviewed by me and considered in my medical decision making (see chart for details).  Clinical Course as of 08/18/21 0117  Wed Aug 18, 2021  0105 Case discussed with Dr. Marlowe Sax who will admit [KH]    Clinical Course User Index [KH] Beverely Pace   MDM Rules/Calculators/A&P                           87 year old male presents to the emergency department for evaluation of worsening renal function.  His creatinine has trended from 2.59 at the end of August to 5.8 on assessment today.  Noted to have bilateral hydronephrosis which appears chronic since July.  He  has a chronic indwelling Foley secondary to history of metastatic prostate cancer.  While likely degree of chronic colonization on urinalysis, the patient has been tachycardic as well as borderline tachypneic.  He has a leukocytosis of 11.4.  Started on cefepime for coverage of presumed associated UTI.  Will be admitted to the hospitalist service for continued management.   Final Clinical Impression(s) / ED Diagnoses Final diagnoses:  Acute renal failure superimposed on chronic kidney disease, unspecified CKD stage, unspecified acute renal failure type [facility])    Rx / DC Orders ED Discharge Orders     None        Antonietta Breach, PA-C 08/18/21 0134    Drenda Freeze, MD 08/18/21 [phone]

## 2021-08-17 NOTE — ED Notes (Signed)
Pt

## 2021-08-17 NOTE — ED Triage Notes (Signed)
Pt from [place] home with ems, pt sent here due to worsening kidney function, requesting dialysis.  Per notes: 8/26 creatinine 2.59 9/26 creatinine 5.75 10/3 creatinine 6.18  Pt hgb was 6.6 on 9-27, he was taken to [place] hospital and given 2Units RBCs, today hgb 9.0  Pt is bedbound and has chronic foley catheter

## 2021-08-17 NOTE — ED Provider Notes (Signed)
Emergency Medicine Provider Triage Evaluation Note  Anthony Dudley , a 87 y.o. male  was evaluated in triage.  Pt sent from home due to worsening renal function. Dementia and difficulty to assess.  Review of Systems  Positive:  Negative: Pain  Physical Exam  BP 127/77   Pulse 93   Temp 97.9 F (36.6 C) (Oral)   Resp (!) 25   SpO2 97%  Gen:   Awake, no distress   Resp:  Normal effort  MSK:   Moves extremities without difficulty  Other:    Medical Decision Making  Medically screening exam initiated at 3:06 PM.  Appropriate orders placed.  Anthony Dudley was informed that the remainder of the evaluation will be completed by another provider, this initial triage assessment does not replace that evaluation, and the importance of remaining in the ED until their evaluation is complete.  Patient is ill, denies pain, cannot tell me if he still makes urine.    Anthony Hammock, PA-C 08/17/21 1509    Anthony Gibbs, DO 08/18/21 5009

## 2021-08-18 ENCOUNTER — Inpatient Hospital Stay (HOSPITAL_COMMUNITY): Payer: Medicare Other

## 2021-08-18 ENCOUNTER — Encounter (HOSPITAL_COMMUNITY): Payer: Self-pay | Admitting: Internal Medicine

## 2021-08-18 ENCOUNTER — Encounter (HOSPITAL_COMMUNITY)
Admission: EM | Disposition: A | Discharge status: Rehabilitation Facility | Payer: Self-pay | Source: Skilled Nursing Facility | Attending: Family Medicine

## 2021-08-18 ENCOUNTER — Inpatient Hospital Stay (HOSPITAL_COMMUNITY): Payer: Medicare Other | Admitting: Certified Registered Nurse Anesthetist

## 2021-08-18 ENCOUNTER — Emergency Department (HOSPITAL_COMMUNITY): Payer: Medicare Other

## 2021-08-18 DIAGNOSIS — R578 Other shock: Secondary | ICD-10-CM | POA: Diagnosis present

## 2021-08-18 DIAGNOSIS — E870 Hyperosmolality and hypernatremia: Secondary | ICD-10-CM | POA: Diagnosis not present

## 2021-08-18 DIAGNOSIS — N189 Chronic kidney disease, unspecified: Secondary | ICD-10-CM | POA: Diagnosis not present

## 2021-08-18 DIAGNOSIS — Z515 Encounter for palliative care: Secondary | ICD-10-CM | POA: Diagnosis not present

## 2021-08-18 DIAGNOSIS — N179 Acute kidney failure, unspecified: Secondary | ICD-10-CM | POA: Diagnosis present

## 2021-08-18 DIAGNOSIS — I130 Hypertensive heart and chronic kidney disease with heart failure and stage 1 through stage 4 chronic kidney disease, or unspecified chronic kidney disease: Secondary | ICD-10-CM | POA: Diagnosis present

## 2021-08-18 DIAGNOSIS — N136 Pyonephrosis: Secondary | ICD-10-CM | POA: Diagnosis present

## 2021-08-18 DIAGNOSIS — Y846 Urinary catheterization as the cause of abnormal reaction of the patient, or of later complication, without mention of misadventure at the time of the procedure: Secondary | ICD-10-CM | POA: Diagnosis present

## 2021-08-18 DIAGNOSIS — T83511A Infection and inflammatory reaction due to indwelling urethral catheter, initial encounter: Secondary | ICD-10-CM | POA: Diagnosis not present

## 2021-08-18 DIAGNOSIS — Z7189 Other specified counseling: Secondary | ICD-10-CM | POA: Diagnosis not present

## 2021-08-18 DIAGNOSIS — E872 Acidosis, unspecified: Secondary | ICD-10-CM | POA: Diagnosis present

## 2021-08-18 DIAGNOSIS — C7951 Secondary malignant neoplasm of bone: Secondary | ICD-10-CM | POA: Diagnosis present

## 2021-08-18 DIAGNOSIS — R6520 Severe sepsis without septic shock: Secondary | ICD-10-CM | POA: Diagnosis present

## 2021-08-18 DIAGNOSIS — N390 Urinary tract infection, site not specified: Secondary | ICD-10-CM | POA: Diagnosis present

## 2021-08-18 DIAGNOSIS — Z0189 Encounter for other specified special examinations: Secondary | ICD-10-CM | POA: Diagnosis not present

## 2021-08-18 DIAGNOSIS — N1831 Chronic kidney disease, stage 3a: Secondary | ICD-10-CM | POA: Diagnosis present

## 2021-08-18 DIAGNOSIS — I471 Supraventricular tachycardia: Secondary | ICD-10-CM | POA: Diagnosis not present

## 2021-08-18 DIAGNOSIS — D62 Acute posthemorrhagic anemia: Secondary | ICD-10-CM | POA: Diagnosis present

## 2021-08-18 DIAGNOSIS — N170 Acute kidney failure with tubular necrosis: Secondary | ICD-10-CM | POA: Diagnosis not present

## 2021-08-18 DIAGNOSIS — A419 Sepsis, unspecified organism: Secondary | ICD-10-CM | POA: Diagnosis present

## 2021-08-18 DIAGNOSIS — T83518A Infection and inflammatory reaction due to other urinary catheter, initial encounter: Secondary | ICD-10-CM | POA: Diagnosis present

## 2021-08-18 DIAGNOSIS — C61 Malignant neoplasm of prostate: Secondary | ICD-10-CM | POA: Diagnosis present

## 2021-08-18 DIAGNOSIS — N135 Crossing vessel and stricture of ureter without hydronephrosis: Secondary | ICD-10-CM | POA: Diagnosis not present

## 2021-08-18 DIAGNOSIS — I5033 Acute on chronic diastolic (congestive) heart failure: Secondary | ICD-10-CM | POA: Diagnosis present

## 2021-08-18 DIAGNOSIS — J9601 Acute respiratory failure with hypoxia: Secondary | ICD-10-CM | POA: Diagnosis present

## 2021-08-18 DIAGNOSIS — I472 Ventricular tachycardia, unspecified: Secondary | ICD-10-CM | POA: Diagnosis not present

## 2021-08-18 DIAGNOSIS — E538 Deficiency of other specified B group vitamins: Secondary | ICD-10-CM | POA: Diagnosis present

## 2021-08-18 DIAGNOSIS — R627 Adult failure to thrive: Secondary | ICD-10-CM | POA: Diagnosis not present

## 2021-08-18 DIAGNOSIS — I9589 Other hypotension: Secondary | ICD-10-CM | POA: Diagnosis not present

## 2021-08-18 DIAGNOSIS — E440 Moderate protein-calorie malnutrition: Secondary | ICD-10-CM | POA: Diagnosis present

## 2021-08-18 DIAGNOSIS — R6521 Severe sepsis with septic shock: Secondary | ICD-10-CM | POA: Diagnosis not present

## 2021-08-18 DIAGNOSIS — F0390 Unspecified dementia without behavioral disturbance: Secondary | ICD-10-CM | POA: Diagnosis present

## 2021-08-18 DIAGNOSIS — T83510D Infection and inflammatory reaction due to cystostomy catheter, subsequent encounter: Secondary | ICD-10-CM | POA: Diagnosis not present

## 2021-08-18 DIAGNOSIS — Z8616 Personal history of COVID-19: Secondary | ICD-10-CM | POA: Diagnosis not present

## 2021-08-18 DIAGNOSIS — N183 Chronic kidney disease, stage 3 unspecified: Secondary | ICD-10-CM | POA: Diagnosis not present

## 2021-08-18 DIAGNOSIS — J159 Unspecified bacterial pneumonia: Secondary | ICD-10-CM | POA: Diagnosis present

## 2021-08-18 DIAGNOSIS — E876 Hypokalemia: Secondary | ICD-10-CM | POA: Diagnosis not present

## 2021-08-18 DIAGNOSIS — N1832 Chronic kidney disease, stage 3b: Secondary | ICD-10-CM | POA: Diagnosis present

## 2021-08-18 DIAGNOSIS — Z4659 Encounter for fitting and adjustment of other gastrointestinal appliance and device: Secondary | ICD-10-CM | POA: Diagnosis not present

## 2021-08-18 DIAGNOSIS — D631 Anemia in chronic kidney disease: Secondary | ICD-10-CM | POA: Diagnosis present

## 2021-08-18 DIAGNOSIS — D696 Thrombocytopenia, unspecified: Secondary | ICD-10-CM | POA: Diagnosis present

## 2021-08-18 HISTORY — PX: CYSTOSCOPY WITH RETROGRADE PYELOGRAM, URETEROSCOPY AND STENT PLACEMENT: SHX5789

## 2021-08-18 LAB — RESP PANEL BY RT-PCR (FLU A&B, COVID) ARPGX2
Influenza A by PCR: NEGATIVE
Influenza B by PCR: NEGATIVE
SARS Coronavirus 2 by RT PCR: NEGATIVE

## 2021-08-18 LAB — IRON AND TIBC
Iron: 39 ug/dL — ABNORMAL LOW (ref 45–182)
Saturation Ratios: 19 % (ref 17.9–39.5)
TIBC: 206 ug/dL — ABNORMAL LOW (ref 250–450)
UIBC: 167 ug/dL

## 2021-08-18 LAB — URINALYSIS, ROUTINE W REFLEX MICROSCOPIC
Bilirubin Urine: NEGATIVE
Glucose, UA: NEGATIVE mg/dL
Ketones, ur: NEGATIVE mg/dL
Nitrite: NEGATIVE
Protein, ur: 100 mg/dL — AB
Specific Gravity, Urine: 1.009 (ref 1.005–1.030)
WBC, UA: >50 WBC/hpf — ABNORMAL HIGH (ref 0–5)
pH: 6 (ref 5.0–8.0)

## 2021-08-18 LAB — COMPREHENSIVE METABOLIC PANEL
ALT: 13 U/L (ref 0–44)
AST: 20 U/L (ref 15–41)
Albumin: 2.7 g/dL — ABNORMAL LOW (ref 3.5–5.0)
Alkaline Phosphatase: 398 U/L — ABNORMAL HIGH (ref 38–126)
Anion gap: 15 (ref 5–15)
BUN: 79 mg/dL — ABNORMAL HIGH (ref 8–23)
CO2: 14 mmol/L — ABNORMAL LOW (ref 22–32)
Calcium: 9.2 mg/dL (ref 8.9–10.3)
Chloride: 110 mmol/L (ref 98–111)
Creatinine, Ser: 5.76 mg/dL — ABNORMAL HIGH (ref 0.61–1.24)
GFR, Estimated: 9 mL/min — ABNORMAL LOW (ref >60)
Glucose, Bld: 127 mg/dL — ABNORMAL HIGH (ref 70–99)
Potassium: 4.5 mmol/L (ref 3.5–5.1)
Sodium: 139 mmol/L (ref 135–145)
Total Bilirubin: 0.4 mg/dL (ref 0.3–1.2)
Total Protein: 7.5 g/dL (ref 6.5–8.1)

## 2021-08-18 LAB — RETICULOCYTES
Immature Retic Fract: 17.8 % — ABNORMAL HIGH (ref 2.3–15.9)
RBC.: 2.69 MIL/uL — ABNORMAL LOW (ref 4.22–5.81)
Retic Count, Absolute: 36.9 10*3/uL (ref 19.0–186.0)
Retic Ct Pct: 1.4 % (ref 0.4–3.1)

## 2021-08-18 LAB — PROTIME-INR
INR: 1.3 — ABNORMAL HIGH (ref 0.8–1.2)
Prothrombin Time: 16.4 seconds — ABNORMAL HIGH (ref 11.4–15.2)

## 2021-08-18 LAB — CBC
HCT: 27.1 % — ABNORMAL LOW (ref 39.0–52.0)
Hemoglobin: 8.5 g/dL — ABNORMAL LOW (ref 13.0–17.0)
MCH: 31.4 pg (ref 26.0–34.0)
MCHC: 31.4 g/dL (ref 30.0–36.0)
MCV: 100 fL (ref 80.0–100.0)
Platelets: 127 10*3/uL — ABNORMAL LOW (ref 150–400)
RBC: 2.71 MIL/uL — ABNORMAL LOW (ref 4.22–5.81)
RDW: 17.2 % — ABNORMAL HIGH (ref 11.5–15.5)
WBC: 11.7 10*3/uL — ABNORMAL HIGH (ref 4.0–10.5)
nRBC: 0 % (ref 0.0–0.2)

## 2021-08-18 LAB — URINE CULTURE

## 2021-08-18 LAB — FERRITIN: Ferritin: 800 ng/mL — ABNORMAL HIGH (ref 24–336)

## 2021-08-18 LAB — ABO/RH: ABO/RH(D): O POS

## 2021-08-18 LAB — CORTISOL-AM, BLOOD: Cortisol - AM: 16.1 ug/dL (ref 6.7–22.6)

## 2021-08-18 LAB — PROCALCITONIN: Procalcitonin: 39.5 ng/mL

## 2021-08-18 LAB — LACTIC ACID, PLASMA
Lactic Acid, Venous: 1 mmol/L (ref 0.5–1.9)
Lactic Acid, Venous: 1.1 mmol/L (ref 0.5–1.9)

## 2021-08-18 LAB — FOLATE: Folate: 7.2 ng/mL (ref >5.9)

## 2021-08-18 LAB — VITAMIN B12: Vitamin B-12: 5327 pg/mL — ABNORMAL HIGH (ref 180–914)

## 2021-08-18 SURGERY — CYSTOURETEROSCOPY, WITH RETROGRADE PYELOGRAM AND STENT INSERTION
Anesthesia: General | Site: Bladder | Laterality: Bilateral

## 2021-08-18 MED ORDER — OXYBUTYNIN CHLORIDE ER 5 MG PO TB24
5.0000 mg | ORAL_TABLET | Freq: Every day | ORAL | Status: DC
  Administered 2021-08-20 – 2021-09-01 (×6): 5 mg via ORAL
  Filled 2021-08-18 (×16): qty 1

## 2021-08-18 MED ORDER — SODIUM CHLORIDE 0.9 % IV SOLN: INTRAVENOUS | Status: DC

## 2021-08-18 MED ORDER — ONDANSETRON HCL 4 MG/2ML IJ SOLN
INTRAMUSCULAR | Status: AC
  Filled 2021-08-18: qty 2

## 2021-08-18 MED ORDER — ALBUTEROL SULFATE (2.5 MG/3ML) 0.083% IN NEBU
INHALATION_SOLUTION | RESPIRATORY_TRACT | Status: AC
  Filled 2021-08-18: qty 3

## 2021-08-18 MED ORDER — LACTATED RINGERS IV SOLN
INTRAVENOUS | Status: DC | PRN
Start: 2021-08-18 — End: 2021-08-18

## 2021-08-18 MED ORDER — FENTANYL CITRATE (PF) 250 MCG/5ML IJ SOLN
INTRAMUSCULAR | Status: DC | PRN
  Administered 2021-08-18 (×2): 50 ug via INTRAVENOUS

## 2021-08-18 MED ORDER — ACETAMINOPHEN 650 MG RE SUPP
650.0000 mg | Freq: Four times a day (QID) | RECTAL | Status: DC | PRN
Start: 2021-08-18 — End: 2021-08-22

## 2021-08-18 MED ORDER — SODIUM CHLORIDE 0.9 % IR SOLN: 3000.0000 mL | Status: DC

## 2021-08-18 MED ORDER — LIDOCAINE HCL URETHRAL/MUCOSAL 2 % EX GEL
CUTANEOUS | Status: AC
  Filled 2021-08-18: qty 11

## 2021-08-18 MED ORDER — ROCURONIUM BROMIDE 10 MG/ML (PF) SYRINGE
PREFILLED_SYRINGE | INTRAVENOUS | Status: DC | PRN
  Administered 2021-08-18: 30 mg via INTRAVENOUS

## 2021-08-18 MED ORDER — SODIUM CHLORIDE 0.9 % IV SOLN
1.0000 g | INTRAVENOUS | Status: DC
  Administered 2021-08-19: 1 g via INTRAVENOUS
  Filled 2021-08-18 (×3): qty 1

## 2021-08-18 MED ORDER — SODIUM BICARBONATE 650 MG PO TABS: 650.0000 mg | ORAL_TABLET | Freq: Every day | ORAL | Status: DC

## 2021-08-18 MED ORDER — LIDOCAINE 2% (20 MG/ML) 5 ML SYRINGE
INTRAMUSCULAR | Status: DC | PRN
Start: 2021-08-18 — End: 2021-08-18
  Administered 2021-08-18: 60 mg via INTRAVENOUS

## 2021-08-18 MED ORDER — FENTANYL CITRATE (PF) 100 MCG/2ML IJ SOLN
INTRAMUSCULAR | Status: AC
  Filled 2021-08-18: qty 2

## 2021-08-18 MED ORDER — FENTANYL CITRATE (PF) 100 MCG/2ML IJ SOLN
25.0000 ug | INTRAMUSCULAR | Status: DC | PRN
  Administered 2021-08-18: 50 ug via INTRAVENOUS
  Administered 2021-08-18 (×2): 25 ug via INTRAVENOUS

## 2021-08-18 MED ORDER — DEXAMETHASONE SODIUM PHOSPHATE 10 MG/ML IJ SOLN
INTRAMUSCULAR | Status: DC | PRN
  Administered 2021-08-18: 5 mg via INTRAVENOUS

## 2021-08-18 MED ORDER — STERILE WATER FOR IRRIGATION IR SOLN
Status: DC | PRN
  Administered 2021-08-18: 3000 mL

## 2021-08-18 MED ORDER — ONDANSETRON HCL 4 MG/2ML IJ SOLN
INTRAMUSCULAR | Status: DC | PRN
  Administered 2021-08-18: 4 mg via INTRAVENOUS

## 2021-08-18 MED ORDER — SODIUM CHLORIDE 0.9 % IR SOLN
Status: DC | PRN
  Administered 2021-08-18: 3000 mL via INTRAVESICAL

## 2021-08-18 MED ORDER — STERILE WATER FOR IRRIGATION IR SOLN
Status: DC | PRN
  Administered 2021-08-18: 1000 mL

## 2021-08-18 MED ORDER — ALBUTEROL SULFATE (2.5 MG/3ML) 0.083% IN NEBU
2.5000 mg | INHALATION_SOLUTION | Freq: Once | RESPIRATORY_TRACT | Status: AC
  Administered 2021-08-18: 2.5 mg via RESPIRATORY_TRACT

## 2021-08-18 MED ORDER — LIDOCAINE 2% (20 MG/ML) 5 ML SYRINGE
INTRAMUSCULAR | Status: AC
  Filled 2021-08-18: qty 5

## 2021-08-18 MED ORDER — SUGAMMADEX SODIUM 200 MG/2ML IV SOLN
INTRAVENOUS | Status: DC | PRN
Start: 2021-08-18 — End: 2021-08-18
  Administered 2021-08-18: 160 mg via INTRAVENOUS

## 2021-08-18 MED ORDER — SUCCINYLCHOLINE 20MG/ML (10ML) SYRINGE FOR MEDFUSION PUMP - OPTIME
INTRAMUSCULAR | Status: DC | PRN
  Administered 2021-08-18: 140 mg via INTRAVENOUS

## 2021-08-18 MED ORDER — METOPROLOL TARTRATE 5 MG/5ML IV SOLN
2.5000 mg | INTRAVENOUS | Status: DC | PRN
  Administered 2021-08-18: 2.5 mg via INTRAVENOUS
  Filled 2021-08-18 (×2): qty 5

## 2021-08-18 MED ORDER — PROPOFOL 10 MG/ML IV BOLUS
INTRAVENOUS | Status: DC | PRN
  Administered 2021-08-18: 100 mg via INTRAVENOUS

## 2021-08-18 MED ORDER — STERILE WATER FOR INJECTION IV SOLN
INTRAVENOUS | Status: DC
  Filled 2021-08-18 (×4): qty 1000

## 2021-08-18 MED ORDER — TAMSULOSIN HCL 0.4 MG PO CAPS
0.4000 mg | ORAL_CAPSULE | Freq: Every day | ORAL | Status: DC
  Administered 2021-08-20: 0.4 mg via ORAL
  Filled 2021-08-18: qty 1

## 2021-08-18 MED ORDER — FENTANYL CITRATE (PF) 250 MCG/5ML IJ SOLN
INTRAMUSCULAR | Status: AC
  Filled 2021-08-18: qty 5

## 2021-08-18 MED ORDER — ACETAMINOPHEN 325 MG PO TABS: 650.0000 mg | ORAL_TABLET | Freq: Four times a day (QID) | ORAL | Status: DC | PRN

## 2021-08-18 MED ORDER — SODIUM CHLORIDE 0.9 % IV BOLUS
500.0000 mL | Freq: Once | INTRAVENOUS | Status: AC
  Administered 2021-08-18: 500 mL via INTRAVENOUS

## 2021-08-18 MED ORDER — SODIUM CHLORIDE 0.9 % IV SOLN
1.0000 g | Freq: Once | INTRAVENOUS | Status: AC
  Administered 2021-08-18: 1 g via INTRAVENOUS
  Filled 2021-08-18: qty 1

## 2021-08-18 MED ORDER — METHYLENE BLUE 0.5 % INJ SOLN
INTRAVENOUS | Status: AC
  Filled 2021-08-18: qty 10

## 2021-08-18 MED ORDER — IOPAMIDOL (ISOVUE-300) INJECTION 61%
INTRAVENOUS | Status: DC | PRN
  Administered 2021-08-18: 100 mL via URETHRAL

## 2021-08-18 MED ORDER — LACTATED RINGERS IV BOLUS
500.0000 mL | Freq: Once | INTRAVENOUS | Status: AC
  Administered 2021-08-18: 500 mL via INTRAVENOUS

## 2021-08-18 MED ORDER — SODIUM CHLORIDE 0.9 % IR SOLN
Status: DC | PRN
  Administered 2021-08-18: 3000 mL

## 2021-08-18 SURGICAL SUPPLY — 20 items
BAG COUNTER SPONGE SURGICOUNT (BAG) IMPLANT
BAG URO CATCHER STRL LF (MISCELLANEOUS) ×2 IMPLANT
CATH HEMATURIA 20FR (CATHETERS) ×2 IMPLANT
CATH INTERMIT  6FR 70CM (CATHETERS) ×2 IMPLANT
ELECT REM PT RETURN 9FT ADLT (ELECTROSURGICAL)
ELECTRODE REM PT RTRN 9FT ADLT (ELECTROSURGICAL) IMPLANT
FIBER LASER TRAC TIP (UROLOGICAL SUPPLIES) IMPLANT
GLOVE SURG ENC TEXT LTX SZ7.5 (GLOVE) ×2 IMPLANT
GLOVE SURG POLYISO LF SZ6.5 (GLOVE) ×2 IMPLANT
GOWN STRL REUS W/ TWL LRG LVL3 (GOWN DISPOSABLE) ×2 IMPLANT
GOWN STRL REUS W/TWL LRG LVL3 (GOWN DISPOSABLE) ×2
GUIDEWIRE ANG ZIPWIRE 038X150 (WIRE) IMPLANT
GUIDEWIRE STR DUAL SENSOR (WIRE) ×2 IMPLANT
MANIFOLD NEPTUNE II (INSTRUMENTS) ×2 IMPLANT
PACK CYSTO (CUSTOM PROCEDURE TRAY) ×2 IMPLANT
SHEATH URETERAL 12FRX35CM (MISCELLANEOUS) IMPLANT
SOL PREP POV-IOD 4OZ 10% (MISCELLANEOUS) ×2 IMPLANT
TRAY FOL W/BAG SLVR 16FR STRL (SET/KITS/TRAYS/PACK) ×1 IMPLANT
TRAY FOLEY W/BAG SLVR 16FR LF (SET/KITS/TRAYS/PACK) ×1
TUBE CONNECTING 12X1/4 (SUCTIONS) ×2 IMPLANT

## 2021-08-18 NOTE — ED Notes (Signed)
Pt continuing to pull off monitors. Pt educated on importance of keeping monitoring cords on

## 2021-08-18 NOTE — Progress Notes (Signed)
Pharmacy Antibiotic Note  Anthony Dudley is a 87 y.o. male admitted on 08/17/2021 with sepsis and UTI.  Pharmacy has been consulted for cefepime dosing.  Pt w/ AKI on CKD.  Plan: Cefepime 1g IV Q24H.  Temp (24hrs), Avg:98.4 F (36.9 C), Min:97.8 F (36.6 C), Max:99.5 F (37.5 C)  Recent Labs  Lab 08/17/21 1504  WBC 11.4*  CREATININE 5.80*     Allergies  Allergen Reactions   Solanum Lycopersicum [Tomato]     Patient reports he is not allergic to tomato and he eats them all the time    Thank you for allowing pharmacy to be a part of this patient's care.  Wynona Neat, PharmD, BCPS  08/18/2021 2:53 AM

## 2021-08-18 NOTE — ED Notes (Signed)
Pt to US.

## 2021-08-18 NOTE — ED Notes (Signed)
Provider updated on newest set of VS, 12 lead done.  Pt is pulling and fidgeting with lines, calling out to go home and trying to get out of bed

## 2021-08-18 NOTE — Anesthesia Procedure Notes (Signed)
Procedure Name: Intubation Date/Time: 08/18/2021 10:19 PM Performed by: Rande Brunt, CRNA Pre-anesthesia Checklist: Patient identified, Emergency Drugs available, Suction available and Patient being monitored Patient Re-evaluated:Patient Re-evaluated prior to induction Oxygen Delivery Method: Circle System Utilized Preoxygenation: Pre-oxygenation with 100% oxygen Induction Type: IV induction, Rapid sequence and Cricoid Pressure applied Ventilation: Mask ventilation without difficulty Laryngoscope Size: Mac and 4 Grade View: Grade II Tube type: Oral Tube size: 7.5 mm Number of attempts: 1 Airway Equipment and Method: Stylet Placement Confirmation: ETT inserted through vocal cords under direct vision, positive ETCO2 and breath sounds checked- equal and bilateral Secured at: 23 cm Tube secured with: Tape Dental Injury: Teeth and Oropharynx as per pre-operative assessment

## 2021-08-18 NOTE — ED Notes (Signed)
Pt begging for sip of water, diet order in - PT given sip of water and choked/coughed after drinking. This RN failed pt on swallow screen and notified MD Rathrore

## 2021-08-18 NOTE — ED Notes (Signed)
Pt brief and bedding changed. New IV placed and mitts placed back onto hands. IV securely tapped and ace bandage placed around it

## 2021-08-18 NOTE — Progress Notes (Signed)
Consent obtained from pt's brother, Gabriel Carina, via phone call with this RN and Robbie Louis, RN.

## 2021-08-18 NOTE — ED Notes (Signed)
Lunch ordered 

## 2021-08-18 NOTE — Anesthesia Preprocedure Evaluation (Addendum)
Anesthesia Evaluation  Patient identified by MRN, date of birth, ID band Patient awake    Reviewed: Allergy & Precautions, NPO status , Patient's Chart, lab work & pertinent test results  Airway Mallampati: II  TM Distance: >3 FB     Dental   Pulmonary neg pulmonary ROS,    breath sounds clear to auscultation       Cardiovascular negative cardio ROS   Rhythm:Regular Rate:Normal     Neuro/Psych negative neurological ROS     GI/Hepatic negative GI ROS, Neg liver ROS,   Endo/Other    Renal/GU Renal disease     Musculoskeletal   Abdominal   Peds  Hematology   Anesthesia Other Findings   Reproductive/Obstetrics                             Anesthesia Physical Anesthesia Plan  ASA: 3  Anesthesia Plan: General   Post-op Pain Management:    Induction: Intravenous  PONV Risk Score and Plan: 2 and Ondansetron, Dexamethasone and Midazolam  Airway Management Planned: Oral ETT  Additional Equipment:   Intra-op Plan:   Post-operative Plan: Extubation in OR  Informed Consent: I have reviewed the patients History and Physical, chart, labs and discussed the procedure including the risks, benefits and alternatives for the proposed anesthesia with the patient or authorized representative who has indicated his/her understanding and acceptance.    Discussed DNR with power of attorney and Suspend DNR.   Dental advisory given  Plan Discussed with: CRNA and Anesthesiologist  Anesthesia Plan Comments:        Anesthesia Quick Evaluation

## 2021-08-18 NOTE — ED Notes (Signed)
Pt pulled out IV in the Rt AC. RN notified.

## 2021-08-18 NOTE — Consult Note (Signed)
Urology Consult Note   Requesting Attending Physician:  Elodia Florence., * Service Providing Consult: Urology  Consulting Attending: Raynelle Bring, MD   Reason for Consult:  Metastatic Prostate Cancer, Hydronephrosis, AKI  HPI: Anthony Dudley is seen in consultation for reasons noted above at the request of Elodia Florence., * for evaluation of bilateral hydronephrosis and AKI in the setting of metastatic prostate cancer.  This is a 87 y.o. male with Hx of dementia, HFpEF, HTN, CKDIII, recurrent MDR UTI's, chronic indwelling foley, and metastatic prostate cancer. He presented from his nursing facility w/ an Acute on Chronic kidney injury. He has had an increasing Cr level since 07/09/21 (2.5 at that time) with it's peak being 6.1 on 08/16/21. He has also has a history of chronic anemia and received 2 units of pRBC's at [facility] in late 07/2021.   It seems that he was previously followed by Radiation Oncology at [facility] for his prostate cancer and had undergone some radiation treatments (although not all of them due to transportation difficulties) and hormonal therapy. It is unclear, but not likely that he is on any current therapy.   He is only oriented to self and cannot give a clear history; however, he appears comfortable and denies pain.  Currently AF. Tachcardic (110s) but other vitals stable.  Cr 5.76 (baseline 2.5), essentially stable overnight Leukocytosis of 11.6, stable overnight Lactate normal (1.1) UA w/ large LE, negative nitrites, many bacteria, and > 50 WBC's. Ucx and Bcx's currently pending.   He was started on Cefepime in the ED.   RUS was performed demonstrating bilateral hydronephrosis (L > R) but overall similar to prior CT scan with some possible worsening on the R.    Past Medical History: History reviewed. No pertinent past medical history.  Past Surgical History:  History reviewed. No pertinent surgical history.  Medication: Current  Facility-Administered Medications  Medication Dose Route Frequency Provider Last Rate Last Admin   acetaminophen (TYLENOL) tablet 650 mg  650 mg Oral Q6H PRN Shela Leff, MD       Or   acetaminophen (TYLENOL) suppository 650 mg  650 mg Rectal Q6H PRN Shela Leff, MD       [START ON 08/19/2021] ceFEPIme (MAXIPIME) 1 g in sodium chloride 0.9 % 100 mL IVPB  1 g Intravenous Q24H Bryk, Veronda P, RPH       metoprolol tartrate (LOPRESSOR) injection 2.5 mg  2.5 mg Intravenous Q5 min PRN Elodia Florence., MD       oxybutynin (DITROPAN-XL) 24 hr tablet 5 mg  5 mg Oral QHS Shela Leff, MD       sodium bicarbonate 150 mEq in sterile water 1,150 mL infusion   Intravenous Continuous Elodia Florence., MD 100 mL/hr at 08/18/21 1111 New Bag at 08/18/21 1111   tamsulosin (FLOMAX) capsule 0.4 mg  0.4 mg Oral Daily Shela Leff, MD       Current Outpatient Medications  Medication Sig Dispense Refill   Cholecalciferol (VITAMIN D-3) 125 MCG (5000 UT) TABS Take 5,000 Units by mouth daily.     ipratropium-albuterol (DUONEB) 0.5-2.5 (3) MG/3ML SOLN Take 3 mLs by nebulization every 4 (four) hours as needed (shortness of breath).     metoprolol tartrate (LOPRESSOR) 25 MG tablet Take 12.5 mg by mouth 2 (two) times daily.     Multiple Vitamins-Iron (MULTI-VITAMIN/IRON) TABS Take 1 tablet by mouth in the morning and at bedtime.     niacinamide 500 MG tablet  Take 500 mg by mouth daily.     Nutritional Supplements (BOOST BREEZE PO) Take 1 Bottle by mouth 3 (three) times daily.     OXYGEN Inhale into the lungs See admin instructions. Put on oxygen if O2 is less than 90% every shift     sodium bicarbonate 650 MG tablet Take 650 mg by mouth daily.     tamsulosin (FLOMAX) 0.4 MG CAPS capsule Take 0.4 mg by mouth daily.     tolterodine (DETROL) 1 MG tablet Take 1 mg by mouth 2 (two) times daily.     vitamin B-12 (CYANOCOBALAMIN) 1000 MCG tablet Take 2,000 mcg by mouth daily.       Allergies: Allergies  Allergen Reactions   Solanum Lycopersicum [Tomato]     Patient reports he is not allergic to tomato and he eats them all the time    Social History:    Family History No family history on file.  Review of Systems 10 systems were reviewed and are negative except as noted specifically in the HPI.  Objective   Vital signs in last 24 hours: BP 120/83   Pulse (!) 157   Temp 99.5 F (37.5 C) (Rectal)   Resp (!) 24   SpO2 93%   Physical Exam General: NAD, pleasantly demented, oriented to self only, resting HEENT: /AT, EOMI, MMM Pulmonary: Normal work of breathing Cardiovascular: adequate peripheral perfusion Abdomen: Soft, NTTP, nondistended. GU: Foley catheter in place draining cloudy yellow urine, no CVA tenderness Extremities: warm and well perfused Neuro: lying in bed, bedbound  Most Recent Labs: Lab Results  Component Value Date   WBC 11.7 (H) 08/18/2021   HGB 8.5 (L) 08/18/2021   HCT 27.1 (L) 08/18/2021   PLT 127 (L) 08/18/2021    Lab Results  Component Value Date   NA 139 08/18/2021   K 4.5 08/18/2021   CL 110 08/18/2021   CO2 14 (L) 08/18/2021   BUN 79 (H) 08/18/2021   CREATININE 5.76 (H) 08/18/2021   CALCIUM 9.2 08/18/2021    Lab Results  Component Value Date   INR 1.3 (H) 08/18/2021    IMAGING: US Renal  Result Date: 08/18/2021 CLINICAL DATA:  Renal failure EXAM: RENAL / URINARY TRACT ULTRASOUND COMPLETE COMPARISON:  06/06/2021 FINDINGS: Right Kidney: Renal measurements: 10.2 x 5.3 x 5.3 cm. = volume: 150 mL. Moderate hydronephrosis is noted. This has progressed slightly in the interval from the prior CT. Previously seen right renal calculus is not well appreciated on today's exam. Left Kidney: Renal measurements: 9.8 x 5.5 x 4.5 cm. = volume: 128 mL. Moderate left-sided hydronephrosis is noted as well stable in appearance from prior CT. Previously seen renal calculus is not well appreciated on today's exam. Bladder:  Foley catheter is noted within the bladder. Other: None. IMPRESSION: Bilateral hydronephrosis left slightly greater than right similar to that seen on prior CT examination. Previously noted renal calculi bilaterally are not well appreciated on today's exam. Electronically Signed   By: Inez Catalina M.D.   On: 08/18/2021 00:30   DG Chest Port 1 View  Result Date: 08/17/2021 CLINICAL DATA:  Shortness of breath EXAM: PORTABLE CHEST 1 VIEW COMPARISON:  06/06/2021, CT 06/06/2021 FINDINGS: Chronic scarring in the left upper lobe. No acute airspace disease or pleural effusion. Stable cardiomediastinal silhouette with aortic atherosclerosis. Patchy bilateral skeletal sclerosis consistent with osseous metastatic disease. IMPRESSION: 1. Scarring in the left upper lobe.  No acute airspace disease. 2. Diffuse rib sclerosis consistent with skeletal metastatic disease Electronically  Signed   By: Donavan Foil M.D.   On: 08/17/2021 23:41    ------  Assessment:  87 y.o. male with Hx of dementia, HFpEF, HTN, CKDIII, recurrent MDR UTI's, chronic indwelling foley, and metastatic prostate cancer who is currently admitted for acute on chronic kidney injury, UTI, and possible malignant obstruction of his ureters.  We will attempt to contact the patient's brother to gain any additional information and discuss goals of care.  The patient does have urine passing into his Foley bag, so he does not have complete urinary obstruction present. Regardless, given his AKI and possible infection he may require urinary tract decompression with either ureteral stents or percutaneous nephrostomy tubes.   We would only proceed with this if it was consistent with his and his families goals of care.    Recommendations: - Continue indwelling Foley catheter - Continue trending Cr - Will call the brother to gain additional insights on patients history and goals of care   Thank you for this consult. Please contact the urology consult  pager with any further questions/concerns.  Reola Mosher, MD [facility] Urology Resident, [facility]

## 2021-08-18 NOTE — Transfer of Care (Signed)
Immediate Anesthesia Transfer of Care Note  Patient: Anthony Dudley  Procedure(s) Performed: CYSTOSCOPY WITH RETROGRADE PYELOGRAM,  AND BILATERAL STENTS (Bilateral)  Patient Location: PACU  Anesthesia Type:General  Level of Consciousness: drowsy  Airway & Oxygen Therapy: Patient Spontanous Breathing and Patient connected to face mask oxygen  Post-op Assessment: Report given to RN and Post -op Vital signs reviewed and stable  Post vital signs: Reviewed and stable  Last Vitals:  Vitals Value Taken Time  BP 127/65 08/18/21 2308  Temp    Pulse 107 08/18/21 2309  Resp 17 08/18/21 2309  SpO2 100 % 08/18/21 2309  Vitals shown include unvalidated device data.  Last Pain:  Vitals:   08/18/21 2105  TempSrc: Oral  PainSc: 0-No pain         Complications: No notable events documented.

## 2021-08-18 NOTE — Progress Notes (Signed)
Patient ID: Anthony Dudley, male   DOB: [birth year], 87 y.o.   MRN: [id number]   Attempted bilateral ureteral stent placement was unsuccessful due to extensive local tumor growth likely related to his prostate cancer.  He began to have bleeding from his tumor growth at his bladder trigone.  Catheter was replaced and he will be started on gentle CBI.  He was also noted to have serve urethral erosion.  He will need to undergo bilateral nephrostomy tube placement tomorrow (or sooner if he becomes febrile) with plan to internalize antegrade stents.  I have updated his brother, Suezanne Jacquet.  Bilateral hydronephrosis/AKI/sepsis: Continue antibiotic therapy/supportive care.  Attempt at retrograde ureteral stent placement unsuccessful due to local tumor growth.  Will need bilateral nephrostomy tube placement by IR with subsequent antegrade stent placement. Hematuria: Wean CBI overnight. Metastatic prostate cancer: PSA and testosterone pending.  Will then consider restarting androgen deprivation and additional imaging to stage his prostate cancer to determine appropriate treatment course. Urethral erosion:  Will consider SP tube placement eventually likely at time of next stent change.

## 2021-08-18 NOTE — ED Notes (Signed)
Pt pulled out another IV and was able to removed mitts and all monitoring again. Cleaned and changed.

## 2021-08-18 NOTE — ED Notes (Signed)
Pt pulled out his IV and pulled off all monitoring. New IV placed, pt cleaned and mitts placed on pt.  New warm blankets applied.

## 2021-08-18 NOTE — Op Note (Signed)
Preoperative diagnosis:  1.  Bilateral hydronephrosis 2.  Acute kidney injury 3.  Sepsis 4.  Metastatic prostate cancer  Postoperative diagnosis:  1.  Bilateral hydronephrosis 2.  Acute kidney injury 3.  Sepsis 4.  Metastatic prostate cancer  Procedures: 1.  Cystoscopy 2.  Fulguration of bladder  Surgeon: Pryor Curia MD  Anesthesia: General  Complications: None  EBL: 25 cc  Specimens: None  Indication: Anthony Dudley is an 87 year old gentleman with a history of metastatic prostate cancer that has not been recently treated.  He presented tonight with evidence of sepsis and was also found to have bilateral hydronephrosis along with an acute kidney injury.  I did have a long discussion with his power of attorney, his brother, Anthony Dudley.  It was decided to proceed with aggressive treatment of his acute kidney injury and sepsis.  We therefore discussed proceeding with attempted bilateral ureteral stent placement understanding that he may have local tumor growth that would prevent this from being possible.  After reviewing options, he gave informed consent and we reviewed the potential risks and complications.  Description of procedure: The patient was taken to the operating room and a general anesthetic was administered.  He was given preoperative antibiotics, placed in the dorsolithotomy position, and prepped and draped in the usual sterile fashion.  Next, a preoperative timeout was performed.  Cystourethroscopy was then performed.  He was noted to have severe urethral erosion to the mid penile urethra.  He was noted to have evidence of stricture disease although the 22 French cystoscope was able to easily bypassed these areas.  Inspection of the bladder revealed local tumor growth within the trigone of the bladder.  No bladder tumors or other abnormalities were identified.  His ureteral orifice ease were not able to be easily identified.  Indigocarmine was not available in the  operating room.  I therefore attempted to look for his ureteral orifices with gentle probing using a sensor guidewire.  However, he began to have significant bleeding likely due to local tumor growth.  The attempt at ureteral stent placement was therefore abandoned.  A Bugbee electrode was used to fulgurate the bleeding areas and a three-way hematuria catheter was placed.  He was begun on gentle continuous bladder irrigation.

## 2021-08-18 NOTE — H&P (Signed)
History and Physical    Anthony Dudley ZOX:[id number] DOB: 1933-07-24 DOA: 08/17/2021  PCP: [name], MD Patient coming from: Nursing home  Chief Complaint: Worsening kidney function  HPI: Anthony Dudley is a 87 y.o. male with medical history significant of prostate cancer, CKD stage III, chronic indwelling Foley catheter, MDR UTI, dementia, bedbound at baseline, chronic diastolic CHF, hypertension, hyperlipidemia COVID infection in June 2020. He was sent to the ED from his nursing home for evaluation of worsening kidney function.  His creatinine was 2.5 on 07/09/21, 5.7 on 08/09/21, and now 6.1 on 08/16/21.  His hemoglobin was 6.6 on 08/10/21 and he received 2 units PRBCs at [facility], hemoglobin 9.0 at his facility on 08/16/21.  In the ED, patient was tachycardic.  Labs notable for WBC 11.4.  Hemoglobin 8.6.  Platelet count 131k.  Bicarb 17.  BUN 79, creatinine 5.8.  UA with large amount of leukocytes, greater than 50 WBCs, and many bacteria. COVID and influenza PCR negative.  Chest x-ray without signs of pneumonia.  Showing patchy bilateral skeletal sclerosis consistent with osseous metastatic disease.  Renal ultrasound showing bilateral moderate hydronephrosis.  Right hydronephrosis has progressed slightly in the interval from prior CT, left hydronephrosis stable compared to prior CT.  No renal calculi seen.  Patient was given a 500 cc fluid bolus.  Patient has dementia, oriented to self only.  Not able to give any history.  Review of Systems:  All systems reviewed and apart from history of presenting illness, are negative.  Past medical history: See HPI.  Past surgical history: Joint replacement in 2005, tonsillectomy  Family history: Father (stroke), sister (cancer and heart disease)  Social history: No tobacco or alcohol use.  Allergies: Tomato  Prior to Admission medications   Not on File    Physical Exam: Vitals:   08/18/21 0000 08/18/21 0030 08/18/21 0031  08/18/21 0045  BP: 117/70 (!) 96/53 (!) 90/54 124/76  Pulse: (!) 109 (!) 107 (!) 110 (!) 109  Resp: (!) 24 20 19  (!) 27  Temp:      TempSrc:      SpO2: 99% 98% 98% 98%    Physical Exam Constitutional:      General: He is not in acute distress. HENT:     Head: Normocephalic and atraumatic.     Mouth/Throat:     Mouth: Mucous membranes are dry.  Eyes:     Extraocular Movements: Extraocular movements intact.     Conjunctiva/sclera: Conjunctivae normal.  Cardiovascular:     Rate and Rhythm: Regular rhythm. Tachycardia present.     Pulses: Normal pulses.  Pulmonary:     Effort: No respiratory distress.     Breath sounds: Normal breath sounds. No wheezing or rales.     Comments: Mildly tachypneic Abdominal:     General: Bowel sounds are normal. There is no distension.     Palpations: Abdomen is soft.     Tenderness: There is no abdominal tenderness.  Musculoskeletal:        General: No swelling or tenderness.     Cervical back: Normal range of motion and neck supple.  Skin:    General: Skin is warm and dry.  Neurological:     General: No focal deficit present.     Mental Status: He is alert and oriented to person, place, and time.     Labs on Admission: I have personally reviewed following labs and imaging studies  CBC: Recent Labs  Lab 08/17/21 1504  WBC 11.4*  NEUTROABS 7.7  HGB 8.6*  HCT 27.6*  MCV 100.0  PLT 536*   Basic Metabolic Panel: Recent Labs  Lab 08/17/21 1504  NA 140  K 4.3  CL 110  CO2 17*  GLUCOSE 148*  BUN 79*  CREATININE 5.80*  CALCIUM 9.0   GFR: CrCl cannot be calculated (Unknown ideal weight.). Liver Function Tests: No results for input(s): AST, ALT, ALKPHOS, BILITOT, PROT, ALBUMIN in the last 168 hours. No results for input(s): LIPASE, AMYLASE in the last 168 hours. No results for input(s): AMMONIA in the last 168 hours. Coagulation Profile: No results for input(s): INR, PROTIME in the last 168 hours. Cardiac Enzymes: No results  for input(s): CKTOTAL, CKMB, CKMBINDEX, TROPONINI in the last 168 hours. BNP (last 3 results) No results for input(s): PROBNP in the last 8760 hours. HbA1C: No results for input(s): HGBA1C in the last 72 hours. CBG: No results for input(s): GLUCAP in the last 168 hours. Lipid Profile: No results for input(s): CHOL, HDL, LDLCALC, TRIG, CHOLHDL, LDLDIRECT in the last 72 hours. Thyroid Function Tests: No results for input(s): TSH, T4TOTAL, FREET4, T3FREE, THYROIDAB in the last 72 hours. Anemia Panel: No results for input(s): VITAMINB12, FOLATE, FERRITIN, TIBC, IRON, RETICCTPCT in the last 72 hours. Urine analysis:    Component Value Date/Time   COLORURINE YELLOW 08/17/2021 0034   APPEARANCEUR CLOUDY (A) 08/17/2021 0034   LABSPEC 1.009 08/17/2021 0034   PHURINE 6.0 08/17/2021 0034   GLUCOSEU NEGATIVE 08/17/2021 0034   HGBUR LARGE (A) 08/17/2021 0034   BILIRUBINUR NEGATIVE 08/17/2021 0034   KETONESUR NEGATIVE 08/17/2021 0034   PROTEINUR 100 (A) 08/17/2021 0034   NITRITE NEGATIVE 08/17/2021 0034   LEUKOCYTESUR LARGE (A) 08/17/2021 0034    Radiological Exams on Admission: US Renal  Result Date: 08/18/2021 CLINICAL DATA:  Renal failure EXAM: RENAL / URINARY TRACT ULTRASOUND COMPLETE COMPARISON:  06/06/2021 FINDINGS: Right Kidney: Renal measurements: 10.2 x 5.3 x 5.3 cm. = volume: 150 mL. Moderate hydronephrosis is noted. This has progressed slightly in the interval from the prior CT. Previously seen right renal calculus is not well appreciated on today's exam. Left Kidney: Renal measurements: 9.8 x 5.5 x 4.5 cm. = volume: 128 mL. Moderate left-sided hydronephrosis is noted as well stable in appearance from prior CT. Previously seen renal calculus is not well appreciated on today's exam. Bladder: Foley catheter is noted within the bladder. Other: None. IMPRESSION: Bilateral hydronephrosis left slightly greater than right similar to that seen on prior CT examination. Previously noted renal  calculi bilaterally are not well appreciated on today's exam. Electronically Signed   By: Inez Catalina M.D.   On: 08/18/2021 00:30   DG Chest Port 1 View  Result Date: 08/17/2021 CLINICAL DATA:  Shortness of breath EXAM: PORTABLE CHEST 1 VIEW COMPARISON:  06/06/2021, CT 06/06/2021 FINDINGS: Chronic scarring in the left upper lobe. No acute airspace disease or pleural effusion. Stable cardiomediastinal silhouette with aortic atherosclerosis. Patchy bilateral skeletal sclerosis consistent with osseous metastatic disease. IMPRESSION: 1. Scarring in the left upper lobe.  No acute airspace disease. 2. Diffuse rib sclerosis consistent with skeletal metastatic disease Electronically Signed   By: Donavan Foil M.D.   On: 08/17/2021 23:41    Assessment/Plan Principal Problem:   Acute-on-chronic kidney injury ([place]) Active Problems:   Metabolic acidosis   Sepsis ([place])   UTI (urinary tract infection) due to urinary indwelling catheter ([place])   Prostate cancer ([place])   AKI on CKD stage III Metabolic acidosis Per review of care everywhere, creatinine  was 1.1 on 10/09/2019.  Labs done at nursing home concerning for progressive renal failure. Creatinine was 2.5 on 07/09/21, 5.7 on 08/09/21, and 6.1 on 08/16/21 on blood work done at nursing home.  Labs done here showing BUN 79, creatinine 5.8.  Bicarb 17.  Renal ultrasound showing bilateral moderate hydronephrosis.  Right hydronephrosis has progressed slightly in the interval from prior CT, left hydronephrosis stable compared to prior CT.  No renal calculi seen.  Hydronephrosis likely related to his prostate cancer, has a chronic indwelling Foley catheter. -IV fluid hydration.  Continue bicarb supplementation.  Repeat BMP.  Avoid nephrotoxic agents/contrast.  Likely not a good candidate for dialysis given advanced age, dementia, and comorbidities.  He is making urine.  No fluid overload.  Consult nephrology in a.m. Consider getting palliative care involved.  Sepsis  secondary to CAUTI Tachycardic with heart rate in the 110s but intermittently going up to the 150s, sinus rhythm.  Blood pressure stable.  Mildly tachypneic.  Labs showing mild leukocytosis.  Has a chronic indwelling Foley catheter and UA with evidence of pyuria and bacteriuria.  Per chart, previous urine culture grew MDR Proteus mirabilis, sensitive to cefepime. -Start cefepime.  Patient was given a 500 cc fluid bolus in the ED.  Does have bilateral hydronephrosis but overall stable compared to prior imaging.  Appears dehydrated with dry mucous membranes and significantly tachycardic.  Give additional 500 cc normal saline bolus and continue maintenance IVF.  Urine culture pending.  Stat lactic acid x2.  Blood culture x2.  Replace Foley catheter.  History of prostate cancer He has a chronic indwelling Foley catheter in place.  Receives Flomax and Detrol at his nursing home.  Followed by rad-onc and urology at [facility].  Chest x-ray done at this time showing findings consistent with osseous metastatic disease. -Will need close outpatient follow-up with rad-onc and urology.   Chronic diastolic CHF Appears dry on exam. -Being given IV fluid for significant tachycardia/sepsis.  Monitor volume status closely.  Hypertension -Hold antihypertensives at this time given concern for sepsis.  Acute on chronic anemia Hemoglobin was in the 9-10 range in 2020.  Reportedly 6.6 on 08/10/2021 and received blood transfusions at [facility].  Hemoglobin 9.0 on 08/16/2021 at his facility.  At present, hemoglobin is 8.6 with MCV 100.0. -Anemia panel and FOBT  Mild thrombocytopenia Possibly due to sepsis. -Continue to monitor  DVT prophylaxis: SCDs at this time, FOBT pending Code Status: Full code Family Communication: No family available at this time. Disposition Plan: Status is: Inpatient  Remains inpatient appropriate because:Inpatient level of care appropriate due to severity of illness  Dispo: The patient  is from: Home              Anticipated d/c is to: Home              Patient currently is not medically stable to d/c.   Difficult to place patient No  Level of care: Level of care: Telemetry Medical  The medical decision making on this patient was of high complexity and the patient is at high risk for clinical deterioration, therefore this is a level 3 visit.  Shela Leff MD [facility]  If 7PM-7AM, please contact night-coverage [email or web address]  08/18/2021, 2:11 AM

## 2021-08-18 NOTE — ED Notes (Signed)
MT Rathore made aware that pt is tachycardic and tachypnic \ - MD notified this RN to maintain maintenance fluids right now

## 2021-08-18 NOTE — Evaluation (Signed)
Clinical/Bedside Swallow Evaluation Patient Details  Name: Anthony Dudley MRN: [id number] Date of Birth: 11-08-1933  Today's Date: 08/18/2021 Time: SLP Start Time (ACUTE ONLY): 0910 SLP Stop Time (ACUTE ONLY): 0940 SLP Time Calculation (min) (ACUTE ONLY): 30 min  Past Medical History: History reviewed. No pertinent past medical history. Past Surgical History: History reviewed. No pertinent surgical history. HPI:  87yo male admitted from nursing home 08/17/21 with worsening kidney function. PMH: prostate cancer, CKD3, chronic indwelling Foley catheter, MDR UTI, dementia, bedbound at baseline, dCHF, HTN, HLD, Cvid (04/2019). CXR = no acute airspace disease    Assessment / Plan / Recommendation  Clinical Impression  Pt was seen at bedside to assess swallow function and safety. Pt was awake and cooperative. Speech difficult to understand due to dysarthria. Pt's lower denture was in the cup. Upper denture was removed and placed in cup with cleaner. Oral care was completed with suction. Pt's oral cavity appeared as if upper denture had not been removed, and oral care had not been completed in quite some time. Pt presents with generalized oral motor weakness, but no obvious asymmetry or weakness was noted. Pt accepted trials of ice chips, thin liquid and puree. Timely oral prep and clearing noted. No overt s/s aspiration on any texture given. Pt was unable to demonstrate ability to bite/chew graham cracker. Mentation appears to be the primary obstacle for adequate PO intake.   Recommend puree diet and thin liquids with meds crushed in puree. RN and MD informed of results and recommendations. Safe swallow precautions posted at [facility]. SLP will follow to assess diet tolerance and determine readiness to advance textures.  SLP Visit Diagnosis: Dysphagia, unspecified (R13.10)    Aspiration Risk  Mild aspiration risk;Moderate aspiration risk    Diet Recommendation Dysphagia 1 (Puree);Thin liquid   Liquid  Administration via: Straw Medication Administration: Crushed with puree Supervision: Full supervision/cueing for compensatory strategies Compensations: Slow rate;Small sips/bites;Minimize environmental distractions Postural Changes: Seated upright at 90 degrees;Remain upright for at least 30 minutes after po intake    Other  Recommendations Oral Care Recommendations: Oral care BID;Staff/trained caregiver to provide oral care Other Recommendations: Have oral suction available    Recommendations for follow up therapy are one component of a multi-disciplinary discharge planning process, led by the attending physician.  Recommendations may be updated based on patient status, additional functional criteria and insurance authorization.  Follow up Recommendations 24 hour supervision/assistance;Skilled Nursing facility      Frequency and Duration min 1 x/week  1 week;2 weeks       Prognosis Prognosis for Safe Diet Advancement: Fair Barriers to Reach Goals: Cognitive deficits      Swallow Study   General HPI: 87yo male admitted from nursing home 08/17/21 with worsening kidney function. PMH: prostate cancer, CKD3, chronic indwelling Foley catheter, MDR UTI, dementia, bedbound at baseline, dCHF, HTN, HLD, Cvid (04/2019). CXR = no acute airspace disease    Oral/Motor/Sensory Function Overall Oral Motor/Sensory Function: Generalized oral weakness   Ice Chips Ice chips: Within functional limits Presentation: Spoon   Thin Liquid Thin Liquid: Within functional limits Presentation: Straw    Nectar Thick Nectar Thick Liquid: Not tested   Honey Thick Honey Thick Liquid: Not tested   Puree Puree: Within functional limits Presentation: Spoon   Solid     Solid: Impaired Oral Phase Impairments: Poor awareness of bolus     Anthony Dudley B. Anthony Dudley, [facility], [place] Speech Language Pathologist Office: [phone]  Shonna Chock 08/18/2021,1:24 PM

## 2021-08-18 NOTE — Progress Notes (Addendum)
PROGRESS NOTE    Anthony Dudley  [name]:[id number] DOB: 12/03/1932 DOA: 08/17/2021 PCP: Anthony Shivers, MD   Chief Complaint  Patient presents with   Acute Renal Failure   Abnormal Lab   Brief Narrative:  87 yo with hx of metastatic prostate cancer, chronic indwelling foley, CKD stage III, hx MDR UTI, dementia, chronic bedbound status, HFpEF, HTN and multiple other medical problems presenting with AKI on CKD from the nursing home.    Creatinine reportedly 2.5 on 8/26.   Cr reportedly 6.1 on 10/3.  He was recently transfused with Anthony Dudley HB of 6.6 at [facility] on 08/10/21.    He's been admitted with sepsis 2/2 UTI and AKI on CKD.    Urology consult pending due to imaging concerning for bilateral hydro.   Assessment & Plan:   Principal Problem:   Acute-on-chronic kidney injury ([place]) Active Problems:   Metabolic acidosis   Sepsis ([place])   UTI (urinary tract infection) due to urinary indwelling catheter ([facility])   Prostate cancer ([place])  Goals of care Discussed with brother.  Pt full code, but he'd like to know if anything were to happen.  Discussed Anthony Dudley not Anthony Dudley great dialysis candidate and his brother seems to understand this.  Will continue medical care, follow urology c/s.  Sepsis 2/2 CAUTI Tachypnea, tachycardia, leukocytosis and UA concerning for UTI - meets criteria for sepsis Prior cx with MDR proteus sensitive to cefepime Continue cefepime Follow blood and urine cultures UA with large LE, many bacteria, 21-50 RBC, >50 WBC Foley exchanged in ED Urology consulted in setting of below  AKI on CKD III  Bilateral Hydronephrosis  Metabolic Acidosis Creatinine 2.5 on 8/26 reportedly 6.1 on 10/3 reportedly Creatinine 5.8 on 10/5 Renal US with bilateral hydro L>R Urology c/s, appreciate recs IV bicarb  Hx of Prostate Cancer Diffuse rib sclerosis c/w metastatic disease Urology c/s Follow PSA per urology  HFpEF Stable, not overloaded, follow  Acute on  Chronic Anemia Hb 6.6 on 9/27, s/p 2 units pRBC 09/2019 hb 9.4 Labs c/w AOCD with iron def  Thrombocytopenia follow  DVT prophylaxis: SCD Code Status: full Family Communication: none at bedside - brother over phone Disposition:   Status is: Inpatient  Remains inpatient appropriate because:Inpatient level of care appropriate due to severity of illness  Dispo: The patient is from: Home              Anticipated d/c is to: Home              Patient currently is not medically stable to d/c.   Difficult to place patient No  Consultants:  urology  Procedures:  none  Antimicrobials:  Anti-infectives (From admission, onward)    Start     Dose/Rate Route Frequency Ordered Stop   08/19/21 0000  ceFEPIme (MAXIPIME) 1 g in sodium chloride 0.9 % 100 mL IVPB        1 g 200 mL/hr over 30 Minutes Intravenous Every 24 hours 08/18/21 0254     08/18/21 0115  ceFEPIme (MAXIPIME) 1 g in sodium chloride 0.9 % 100 mL IVPB        1 g 200 mL/hr over 30 Minutes Intravenous  Once 08/18/21 0111 08/18/21 0453          Subjective: Says his name Denies pain  Objective: Vitals:   08/18/21 0645 08/18/21 0734 08/18/21 0820 08/18/21 0830  BP: 114/83 120/71 132/82 129/85  Pulse: (!) 120 (!) 121 (!) 121 (!) 132  Resp: (!) 23 [name]  37 (!) 31 (!) 37  Temp:      TempSrc:      SpO2: 94% 96% 100% 98%   No intake or output data in the 24 hours ending 08/18/21 1104 There were no vitals filed for this visit.  Examination:  General exam: delirious, chronically ill appearing Respiratory system: tachypneic, CTAB Cardiovascular system: tachycardic, regular Gastrointestinal system: s/nt/nd Foley catheter in place Central nervous system: Alert and oriented x1. No focal neurological deficits. Extremities: no LEE  Data Reviewed: I have personally reviewed following labs and imaging studies  CBC: Recent Labs  Lab 08/17/21 1504 08/18/21 0301  WBC 11.4* 11.7*  NEUTROABS 7.7  --   HGB 8.6* 8.5*   HCT 27.6* 27.1*  MCV 100.0 100.0  PLT 131* 127*    Basic Metabolic Panel: Recent Labs  Lab 08/17/21 1504 08/18/21 0301  NA 140 139  K 4.3 4.5  CL 110 110  CO2 17* 14*  GLUCOSE 148* 127*  BUN 79* 79*  CREATININE 5.80* 5.76*  CALCIUM 9.0 9.2    GFR: CrCl cannot be calculated (Unknown ideal weight.).  Liver Function Tests: Recent Labs  Lab 08/18/21 0301  AST 20  ALT 13  ALKPHOS 398*  BILITOT 0.4  PROT 7.5  ALBUMIN 2.7*    CBG: No results for input(s): GLUCAP in the last 168 hours.   Recent Results (from the past 240 hour(s))  Resp Panel by RT-PCR (Flu Anthony Dudley&B, Covid) Nasopharyngeal Swab     Status: None   Collection Time: 08/17/21 11:19 PM   Specimen: Nasopharyngeal Swab; Nasopharyngeal(NP) swabs in vial transport medium  Result Value Ref Range Status   SARS Coronavirus 2 by RT PCR NEGATIVE NEGATIVE Final    Comment: (NOTE) SARS-CoV-2 target nucleic acids are NOT DETECTED.  The SARS-CoV-2 RNA is generally detectable in upper respiratory specimens during the acute phase of infection. The lowest concentration of SARS-CoV-2 viral copies this assay can detect is 138 copies/mL. Anthony Dudley negative result does not preclude SARS-Cov-2 infection and should not be used as the sole basis for treatment or other patient management decisions. Anthony Dudley negative result may occur with  improper specimen collection/handling, submission of specimen other than nasopharyngeal swab, presence of viral mutation(s) within the areas targeted by this assay, and inadequate number of viral copies(<138 copies/mL). Anthony Dudley negative result must be combined with clinical observations, patient history, and epidemiological information. The expected result is Negative.  Fact Sheet for Patients:  EntrepreneurPulse.com.au  Fact Sheet for Healthcare Providers:  IncredibleEmployment.be  This test is no t yet approved or cleared by the [place] FDA and  has been authorized  for detection and/or diagnosis of SARS-CoV-2 by FDA under an Emergency Use Authorization (EUA). This EUA will remain  in effect (meaning this test can be used) for the duration of the COVID-19 declaration under Section 564(b)(1) of the Act, 21 U.S.C.section 360bbb-3(b)(1), unless the authorization is terminated  or revoked sooner.       Influenza Zyonna Vardaman by PCR NEGATIVE NEGATIVE Final   Influenza B by PCR NEGATIVE NEGATIVE Final    Comment: (NOTE) The Xpert Xpress SARS-CoV-2/FLU/RSV plus assay is intended as an aid in the diagnosis of influenza from Nasopharyngeal swab specimens and should not be used as Jakylah Bassinger sole basis for treatment. Nasal washings and aspirates are unacceptable for Xpert Xpress SARS-CoV-2/FLU/RSV testing.  Fact Sheet for Patients: EntrepreneurPulse.com.au  Fact Sheet for Healthcare Providers: IncredibleEmployment.be  This test is not yet approved or cleared by the [place] and has been authorized  for detection and/or diagnosis of SARS-CoV-2 by FDA under an Emergency Use Authorization (EUA). This EUA will remain in effect (meaning this test can be used) for the duration of the COVID-19 declaration under Section 564(b)(1) of the Act, 21 U.S.C. section 360bbb-3(b)(1), unless the authorization is terminated or revoked.  Performed at [facility], [place] [address] [place], [place] [address]          Radiology Studies: US Renal  Result Date: 08/18/2021 CLINICAL DATA:  Renal failure EXAM: RENAL / URINARY TRACT ULTRASOUND COMPLETE COMPARISON:  06/06/2021 FINDINGS: Right Kidney: Renal measurements: 10.2 x 5.3 x 5.3 cm. = volume: 150 mL. Moderate hydronephrosis is noted. This has progressed slightly in the interval from the prior CT. Previously seen right renal calculus is not well appreciated on today's exam. Left Kidney: Renal measurements: 9.8 x 5.5 x 4.5 cm. = volume: 128 mL. Moderate left-sided hydronephrosis is noted  as well stable in appearance from prior CT. Previously seen renal calculus is not well appreciated on today's exam. Bladder: Foley catheter is noted within the bladder. Other: None. IMPRESSION: Bilateral hydronephrosis left slightly greater than right similar to that seen on prior CT examination. Previously noted renal calculi bilaterally are not well appreciated on today's exam. Electronically Signed   By: Inez Catalina M.D.   On: 08/18/2021 00:30   DG Chest Port 1 View  Result Date: 08/17/2021 CLINICAL DATA:  Shortness of breath EXAM: PORTABLE CHEST 1 VIEW COMPARISON:  06/06/2021, CT 06/06/2021 FINDINGS: Chronic scarring in the left upper lobe. No acute airspace disease or pleural effusion. Stable cardiomediastinal silhouette with aortic atherosclerosis. Patchy bilateral skeletal sclerosis consistent with osseous metastatic disease. IMPRESSION: 1. Scarring in the left upper lobe.  No acute airspace disease. 2. Diffuse rib sclerosis consistent with skeletal metastatic disease Electronically Signed   By: Donavan Foil M.D.   On: 08/17/2021 23:41        Scheduled Meds:  oxybutynin  5 mg Oral QHS   tamsulosin  0.4 mg Oral Daily   Continuous Infusions:  [START ON 08/19/2021] ceFEPime (MAXIPIME) IV      sodium bicarbonate (isotonic) infusion in sterile water       LOS: 0 days    Time spent: over 30 min 40 min critical care with AKI on CKD, tachypnea, tachycardia - c/w sepsis 2/2 UTI    Fayrene Helper, MD [facility]   To contact the attending provider between 7A-7P or the covering provider during after hours 7P-7A, please log into the web site [email or web address] and access using universal [place] password for that web site. If you do not have the password, please call the hospital operator.  08/18/2021, 11:04 AM

## 2021-08-19 ENCOUNTER — Encounter (HOSPITAL_COMMUNITY): Payer: Self-pay | Admitting: Urology

## 2021-08-19 ENCOUNTER — Inpatient Hospital Stay (HOSPITAL_COMMUNITY): Payer: Medicare Other

## 2021-08-19 DIAGNOSIS — N189 Chronic kidney disease, unspecified: Secondary | ICD-10-CM | POA: Diagnosis not present

## 2021-08-19 DIAGNOSIS — N179 Acute kidney failure, unspecified: Secondary | ICD-10-CM | POA: Diagnosis not present

## 2021-08-19 HISTORY — PX: IR NEPHROSTOMY PLACEMENT RIGHT: IMG6064

## 2021-08-19 HISTORY — PX: IR NEPHROSTOMY PLACEMENT LEFT: IMG6063

## 2021-08-19 LAB — CBC WITH DIFFERENTIAL/PLATELET
Abs Immature Granulocytes: 0.15 10*3/uL — ABNORMAL HIGH (ref 0.00–0.07)
Basophils Absolute: 0 10*3/uL (ref 0.0–0.1)
Basophils Relative: 0 %
Eosinophils Absolute: 0 10*3/uL (ref 0.0–0.5)
Eosinophils Relative: 0 %
HCT: 24 % — ABNORMAL LOW (ref 39.0–52.0)
Hemoglobin: 7.7 g/dL — ABNORMAL LOW (ref 13.0–17.0)
Immature Granulocytes: 2 %
Lymphocytes Relative: 10 %
Lymphs Abs: 1 10*3/uL (ref 0.7–4.0)
MCH: 31.8 pg (ref 26.0–34.0)
MCHC: 32.1 g/dL (ref 30.0–36.0)
MCV: 99.2 fL (ref 80.0–100.0)
Monocytes Absolute: 0.1 10*3/uL (ref 0.1–1.0)
Monocytes Relative: 2 %
Neutro Abs: 8.1 10*3/uL — ABNORMAL HIGH (ref 1.7–7.7)
Neutrophils Relative %: 86 %
Platelets: 120 10*3/uL — ABNORMAL LOW (ref 150–400)
RBC: 2.42 MIL/uL — ABNORMAL LOW (ref 4.22–5.81)
RDW: 17.1 % — ABNORMAL HIGH (ref 11.5–15.5)
WBC: 9.4 10*3/uL (ref 4.0–10.5)
nRBC: 0 % (ref 0.0–0.2)

## 2021-08-19 LAB — MRSA NEXT GEN BY PCR, NASAL: MRSA by PCR Next Gen: DETECTED — AB

## 2021-08-19 LAB — COMPREHENSIVE METABOLIC PANEL
ALT: 13 U/L (ref 0–44)
AST: 14 U/L — ABNORMAL LOW (ref 15–41)
Albumin: 2.4 g/dL — ABNORMAL LOW (ref 3.5–5.0)
Alkaline Phosphatase: 357 U/L — ABNORMAL HIGH (ref 38–126)
Anion gap: 13 (ref 5–15)
BUN: 71 mg/dL — ABNORMAL HIGH (ref 8–23)
CO2: 18 mmol/L — ABNORMAL LOW (ref 22–32)
Calcium: 9 mg/dL (ref 8.9–10.3)
Chloride: 114 mmol/L — ABNORMAL HIGH (ref 98–111)
Creatinine, Ser: 5.72 mg/dL — ABNORMAL HIGH (ref 0.61–1.24)
GFR, Estimated: 9 mL/min — ABNORMAL LOW (ref >60)
Glucose, Bld: 140 mg/dL — ABNORMAL HIGH (ref 70–99)
Potassium: 4.4 mmol/L (ref 3.5–5.1)
Sodium: 145 mmol/L (ref 135–145)
Total Bilirubin: 0.8 mg/dL (ref 0.3–1.2)
Total Protein: 6.6 g/dL (ref 6.5–8.1)

## 2021-08-19 LAB — HEMOGLOBIN AND HEMATOCRIT, BLOOD
HCT: 26.8 % — ABNORMAL LOW (ref 39.0–52.0)
Hemoglobin: 8.1 g/dL — ABNORMAL LOW (ref 13.0–17.0)

## 2021-08-19 LAB — CREATININE, SERUM
Creatinine, Ser: 6.62 mg/dL — ABNORMAL HIGH (ref 0.61–1.24)
GFR, Estimated: 8 mL/min — ABNORMAL LOW (ref >60)

## 2021-08-19 LAB — PSA: Prostatic Specific Antigen: 2268 ng/mL — ABNORMAL HIGH (ref 0.00–4.00)

## 2021-08-19 LAB — PHOSPHORUS: Phosphorus: 4.5 mg/dL (ref 2.5–4.6)

## 2021-08-19 LAB — MAGNESIUM: Magnesium: 1.8 mg/dL (ref 1.7–2.4)

## 2021-08-19 MED ORDER — CEFAZOLIN SODIUM-DEXTROSE 2-4 GM/100ML-% IV SOLN
INTRAVENOUS | Status: AC
  Administered 2021-08-19: 2 g via INTRAVENOUS
  Filled 2021-08-19: qty 100

## 2021-08-19 MED ORDER — LORAZEPAM 2 MG/ML IJ SOLN
INTRAMUSCULAR | Status: AC
  Filled 2021-08-19: qty 1

## 2021-08-19 MED ORDER — CHLORHEXIDINE GLUCONATE CLOTH 2 % EX PADS
6.0000 | MEDICATED_PAD | Freq: Every day | CUTANEOUS | Status: DC
  Administered 2021-08-19 – 2021-09-06 (×17): 6 via TOPICAL

## 2021-08-19 MED ORDER — CHLORHEXIDINE GLUCONATE CLOTH 2 % EX PADS
6.0000 | MEDICATED_PAD | Freq: Every day | CUTANEOUS | Status: AC
  Administered 2021-08-23: 6 via TOPICAL

## 2021-08-19 MED ORDER — MUPIROCIN 2 % EX OINT
1.0000 "application " | TOPICAL_OINTMENT | Freq: Two times a day (BID) | CUTANEOUS | Status: AC
  Administered 2021-08-19 – 2021-08-24 (×10): 1 via NASAL
  Filled 2021-08-19: qty 22

## 2021-08-19 MED ORDER — VANCOMYCIN HCL 1500 MG/300ML IV SOLN
1500.0000 mg | Freq: Once | INTRAVENOUS | Status: AC
  Administered 2021-08-19: 1500 mg via INTRAVENOUS
  Filled 2021-08-19: qty 300

## 2021-08-19 MED ORDER — VANCOMYCIN VARIABLE DOSE PER UNSTABLE RENAL FUNCTION (PHARMACIST DOSING): Status: DC

## 2021-08-19 MED ORDER — IOHEXOL 350 MG/ML SOLN
100.0000 mL | Freq: Once | INTRAVENOUS | Status: AC | PRN
  Administered 2021-08-19: 40 mL via INTRAVENOUS

## 2021-08-19 MED ORDER — LORAZEPAM 2 MG/ML IJ SOLN
0.5000 mg | Freq: Once | INTRAMUSCULAR | Status: AC
  Administered 2021-08-19: 0.5 mg via INTRAVENOUS
  Filled 2021-08-19: qty 1

## 2021-08-19 MED ORDER — LIDOCAINE HCL 1 % IJ SOLN
INTRAMUSCULAR | Status: AC
  Filled 2021-08-19: qty 20

## 2021-08-19 MED ORDER — LACTATED RINGERS IV BOLUS
1000.0000 mL | Freq: Once | INTRAVENOUS | Status: AC
  Administered 2021-08-19: 1000 mL via INTRAVENOUS

## 2021-08-19 MED ORDER — DEGARELIX ACETATE(240 MG DOSE) 120 MG/VIAL ~~LOC~~ SOLR
240.0000 mg | Freq: Once | SUBCUTANEOUS | Status: AC
  Administered 2021-08-19: 240 mg via SUBCUTANEOUS
  Filled 2021-08-19: qty 6

## 2021-08-19 MED ORDER — SODIUM CHLORIDE 0.9 % IV SOLN
2.0000 g | INTRAVENOUS | Status: DC
  Administered 2021-08-19: 2 g via INTRAVENOUS
  Filled 2021-08-19: qty 2

## 2021-08-19 MED ORDER — FENTANYL CITRATE (PF) 100 MCG/2ML IJ SOLN
INTRAMUSCULAR | Status: AC
  Filled 2021-08-19: qty 2

## 2021-08-19 MED ORDER — LIDOCAINE HCL (PF) 1 % IJ SOLN
INTRAMUSCULAR | Status: DC | PRN
  Administered 2021-08-19: 10 mL

## 2021-08-19 MED ORDER — METOPROLOL TARTRATE 5 MG/5ML IV SOLN
5.0000 mg | INTRAVENOUS | Status: DC | PRN
  Administered 2021-08-19: 5 mg via INTRAVENOUS
  Filled 2021-08-19 (×2): qty 5

## 2021-08-19 MED ORDER — CEFAZOLIN SODIUM-DEXTROSE 2-4 GM/100ML-% IV SOLN
2.0000 g | INTRAVENOUS | Status: AC
  Filled 2021-08-19: qty 100

## 2021-08-19 MED ORDER — MIDAZOLAM HCL 2 MG/2ML IJ SOLN
INTRAMUSCULAR | Status: AC
  Filled 2021-08-19: qty 2

## 2021-08-19 MED ORDER — MIDAZOLAM HCL 2 MG/2ML IJ SOLN
INTRAMUSCULAR | Status: DC | PRN
  Administered 2021-08-19 – 2021-08-31 (×4): .5 mg via INTRAVENOUS

## 2021-08-19 NOTE — Procedures (Signed)
Pre Procedure Dx: Hydronephrosis Post Procedural Dx: Same  Successful [place] and fluoroscopic guided placement of bilateral 10 Fr PCNs with ends coiled and locked within the renal pelvis. Both PCNs connected to gravity bags.  EBL: Minimal Complications: None immediate.  Ronny Bacon, MD Pager #: [phone]

## 2021-08-19 NOTE — Progress Notes (Signed)
Patient ID: Anthony Dudley, male   DOB: Feb 26, 1933, 87 y.o.   MRN: [id number]  1 Day Post-Op Subjective: Pt not very responsive this morning consistent with baseline dementia.  Confusion overnight according to nursing.  Objective: Vital signs in last 24 hours: Temp:  [97.2 F (36.2 C)-98 F (36.7 C)] 97.2 F (36.2 C) (10/06 0010) Pulse Rate:  [107-157] 121 (10/06 0010) Resp:  [17-37] 23 (10/06 0413) BP: (95-134)/(39-85) 129/75 (10/06 0300) SpO2:  [91 %-100 %] 92 % (10/06 0300) Weight:  [80.1 kg] 80.1 kg (10/06 0036)  Intake/Output from previous day: 10/05 0701 - 10/06 0700 In: 2346.5 [I.V.:2246.5; IV Piggyback:100] Out: 8546 [EVOJJ:0093; Blood:50] Intake/Output this shift: No intake/output data recorded.  Physical Exam:  General: Alert and oriented GU: Catheter indwelling with mostly clear urine on mild CBI drip  Lab Results: Recent Labs    08/17/21 1504 08/18/21 0301 08/19/21 0221  HGB 8.6* 8.5* 7.7*  HCT 27.6* 27.1* 24.0*   BMET Recent Labs    08/18/21 0301 08/19/21 0221  NA 139 145  K 4.5 4.4  CL 110 114*  CO2 14* 18*  GLUCOSE 127* 140*  BUN 79* 71*  CREATININE 5.76* 5.72*  CALCIUM 9.2 9.0   PSA 2,268  Studies/Results: US Renal  Result Date: 08/18/2021 CLINICAL DATA:  Renal failure EXAM: RENAL / URINARY TRACT ULTRASOUND COMPLETE COMPARISON:  06/06/2021 FINDINGS: Right Kidney: Renal measurements: 10.2 x 5.3 x 5.3 cm. = volume: 150 mL. Moderate hydronephrosis is noted. This has progressed slightly in the interval from the prior CT. Previously seen right renal calculus is not well appreciated on today's exam. Left Kidney: Renal measurements: 9.8 x 5.5 x 4.5 cm. = volume: 128 mL. Moderate left-sided hydronephrosis is noted as well stable in appearance from prior CT. Previously seen renal calculus is not well appreciated on today's exam. Bladder: Foley catheter is noted within the bladder. Other: None. IMPRESSION: Bilateral hydronephrosis left slightly greater  than right similar to that seen on prior CT examination. Previously noted renal calculi bilaterally are not well appreciated on today's exam. Electronically Signed   By: Inez Catalina M.D.   On: 08/18/2021 00:30   DG Chest Port 1 View  Result Date: 08/17/2021 CLINICAL DATA:  Shortness of breath EXAM: PORTABLE CHEST 1 VIEW COMPARISON:  06/06/2021, CT 06/06/2021 FINDINGS: Chronic scarring in the left upper lobe. No acute airspace disease or pleural effusion. Stable cardiomediastinal silhouette with aortic atherosclerosis. Patchy bilateral skeletal sclerosis consistent with osseous metastatic disease. IMPRESSION: 1. Scarring in the left upper lobe.  No acute airspace disease. 2. Diffuse rib sclerosis consistent with skeletal metastatic disease Electronically Signed   By: Donavan Foil M.D.   On: 08/17/2021 23:41    Assessment/Plan: Bilateral hydronephrosis/AKI/sepsis: Continue antibiotic therapy/supportive care.  Attempt at retrograde ureteral stent placement unsuccessful due to significant local tumor growth.  Will need bilateral nephrostomy tube placement by IR with subsequent antegrade stent placement. Hematuria: Due to irritation of local tumor growth in bladder during procedure. Wean CBI. Metastatic prostate cancer: PSA significantly elevated consistent with probable widespread metastatic prostate cancer.  Will begin ADT with degarelix 240 mg today to start androgen deprivation assuming testosterone is not castrate.  He will likely need treatment escalation pending further staging studies which we can consider soon after nephrostomy tubes are placed to address more acute issues. Urethral erosion/urinary retention:  Continue Foley for now.  Does not need to continue tamsulosin.  Will consider SP tube placement eventually (likely at time of next stent change when he  gets general anesthesia).   LOS: 1 day   Dutch Gray 08/19/2021, 7:08 AM

## 2021-08-19 NOTE — Progress Notes (Addendum)
Pt woke up agitated after surgery. Pt pulled out an IV, pulled off O2 sensor/tele leads. Pt tachypneic in the 140s due to agitation. Mittens replaced. IV switched to other arm. MD paged.

## 2021-08-19 NOTE — Progress Notes (Signed)
   08/19/21 2100  Assess: MEWS Score  Pulse Rate (!) 116  ECG Heart Rate (!) 115  Resp (!) 24  SpO2 95 %  O2 Device Nasal Cannula  O2 Flow Rate (L/min) 5 L/min  Assess: MEWS Score  MEWS Temp 0  MEWS Systolic 0  MEWS Pulse 2  MEWS RR 1  MEWS LOC 1  MEWS Score 4  MEWS Score Color Red  Assess: if the MEWS score is Yellow or Red  Were vital signs taken at a resting state? No  Focused Assessment No change from prior assessment  Early Detection of Sepsis Score *See Row Information* Low  MEWS guidelines implemented *See Row Information* No, other (Comment) (breathing better and HR better)  Document  Patient Outcome Stabilized after interventions  Progress note created (see row info) Yes

## 2021-08-19 NOTE — Anesthesia Postprocedure Evaluation (Signed)
Anesthesia Post Note  Patient: Anthony Dudley  Procedure(s) Performed: 1.  Cystoscopy 2.  Fulguration of bladder (Bilateral: Bladder)     Patient location during evaluation: PACU Anesthesia Type: General Level of consciousness: awake Pain management: pain level controlled Vital Signs Assessment: post-procedure vital signs reviewed and stable Respiratory status: spontaneous breathing Cardiovascular status: stable Postop Assessment: no apparent nausea or vomiting Anesthetic complications: no   No notable events documented.  Last Vitals:  Vitals:   08/19/21 0355 08/19/21 0413  BP:    Pulse:    Resp: (!) 26 (!) 23  Temp:    SpO2:      Last Pain:  Vitals:   08/18/21 2105  TempSrc: Oral  PainSc: 0-No pain                 [name]

## 2021-08-19 NOTE — Progress Notes (Signed)
Pt's son, Rowe Warman, is the 2nd contact for this patient (per POA). His contact info has been added to the emergency contacts.

## 2021-08-19 NOTE — Progress Notes (Addendum)
Pharmacy Antibiotic Note  Anthony Dudley is a 87 y.o. male admitted on 08/17/2021 with AKI on CKD III, metabolic acidosis, sepsis, UTI (indwelling catheter), prostate cancer. Pharmacy has been consulted for vancomycin dosing for pneumonia.  WBC 9.4 (down from 11.4 on admission), afebrile; Scr 5.72 (~stable since admission, but not back to baseline; per provider note, Scr was 2.5 in late August 2022), CrCl ~10.7 ml/min; renal U/S: bilateral hydronephrosis; pt in radiology for bilateral nephrostomy tubes this afternoon)  Pt is currently receiving cefepime 1 gm IV Q 24 hrs for UTI; per provider notes, pt has prior cx with MDR Proteus susceptible to cefepime  Plan: Vancomycin 1500 mg IV X 1; given pt's poor renal function, will check vancomycin random level with AM labs prior to ordering further vancomycin doses With sepsis diagnosis, increase cefepime to 2 gm IV Q 24 hrs Monitor WBC, temp, clinical course, cultures, renal function (monitor closely after nephrostomy tubes placed), vancomycin levels  Weight: 80.1 kg (176 lb 9.4 oz)  Temp (24hrs), Avg:97.8 F (36.6 C), Min:97.2 F (36.2 C), Max:98.2 F (36.8 C)  Recent Labs  Lab 08/17/21 1504 08/18/21 0301 08/18/21 0629 08/19/21 0221  WBC 11.4* 11.7*  --  9.4  CREATININE 5.80* 5.76*  --  5.72*  LATICACIDVEN  --  1.0 1.1  --     CrCl cannot be calculated (Unknown ideal weight.).    Allergies  Allergen Reactions   Solanum Lycopersicum [Tomato]     Patient reports he is not allergic to tomato and he eats them all the time    Antimicrobials this admission: Cefepime 10/5 >> Cefazolin X 1 for procedure 10/6 Vancomycin 10/6 >>  Microbiology results: 10/4 COVID, flu A, flu B: negative 10/5 Bld cx X 2: NGTD  Thank you for allowing pharmacy to be a part of this patient's care.  Gillermina Hu, PharmD, BCPS, [facility] Clinical Pharmacist 08/19/2021 3:27 PM

## 2021-08-19 NOTE — Progress Notes (Addendum)
PROGRESS NOTE    Anthony Dudley  QAS:[id number] DOB: 1933/03/04 DOA: 08/17/2021 PCP: [name], MD   Chief Complaint  Patient presents with   Acute Renal Failure   Abnormal Lab   Brief Narrative:  87 yo with hx of metastatic prostate cancer, chronic indwelling foley, CKD stage III, hx MDR UTI, dementia, chronic bedbound status, HFpEF, HTN and multiple other medical problems presenting with AKI on CKD from the nursing home.    Creatinine reportedly 2.5 on 8/26.   Cr reportedly 6.1 on 10/3.  He was recently transfused with Oakleigh Hesketh HB of 6.6 at [facility] on 08/10/21.    He's been admitted with sepsis 2/2 UTI and AKI on CKD.    Urology consult pending due to imaging concerning for bilateral hydro.   Assessment & Plan:   Principal Problem:   Acute-on-chronic kidney injury ([place]) Active Problems:   Metabolic acidosis   Sepsis ([place])   UTI (urinary tract infection) due to urinary indwelling catheter ([facility]CC)   Prostate cancer ([facility])  Goals of care Discussed with brother 10/5.  Pt full code, but he'd like to know if anything were to happen.  Discussed [name] not Ashantae Pangallo great dialysis candidate and his brother seems to understand this.  Will continue medical care, follow urology c/s.  Acute Hypoxic Respiratory Failure CXR is c/w pneumonia Suspect with his renal failure and hx HFpEF and exam with edema, this may actually represent edema Stop IVF Abx broadened to include vanc/cefepime Hopefully will have post obstructive diuresis after perc tubes placed  Sepsis 2/2 CAUTI Tachypnea, tachycardia, leukocytosis and UA concerning for UTI - meets criteria for sepsis Prior cx with MDR proteus sensitive to cefepime Continue cefepime Follow blood (Ngx1) and urine cultures (multiple species) UA with large LE, many bacteria, 21-50 RBC, >50 WBC Foley exchanged in ED Urology consulted in setting of below  AKI on CKD III  Bilateral Hydronephrosis  Metabolic Acidosis Creatinine  2.5 on 8/26 reportedly 6.1 on 10/3 reportedly Creatinine 5.72 today CT with moderate bilateral hydro Renal US with bilateral hydro L>R Urology c/s, appreciate recs - planning bilateral nephrostomy tubes (failed retrograde ureteral stent placement) -> hopefully this PM  Hx of Prostate Cancer Diffuse rib sclerosis c/w metastatic disease Urology c/s - degarelix per urology PSA elevated, follow testosterone Will need treatment escalation pending further staging studies  Urethral Erosion  Urinary Retention Foley for now, per urology  Hematuria  Due to procedure, irritation of local tumor in bladder CBI per urology   HFpEF Developing LE edema Stop IVF  Acute on Chronic Anemia Hb 6.6 on 9/27, s/p 2 units pRBC 09/2019 hb 9.4 Labs c/w AOCD with iron def Trended down mildly to 7.7 today, repeat this afternoon   Thrombocytopenia follow  DVT prophylaxis: SCD Code Status: full Family Communication: none at bedside - brother over phone 10/5 Disposition:   Status is: Inpatient  Remains inpatient appropriate because:Inpatient level of care appropriate due to severity of illness  Dispo: The patient is from: Home              Anticipated d/c is to: Home              Patient currently is not medically stable to d/c.   Difficult to place patient No  Consultants:  urology  Procedures:  none  Antimicrobials:  Anti-infectives (From admission, onward)    Start     Dose/Rate Route Frequency Ordered Stop   08/19/21 1545  ceFAZolin (ANCEF) IVPB 2g/100 mL premix  2 g 200 mL/hr over 30 Minutes Intravenous To Radiology 08/19/21 1452 08/20/21 1545   08/19/21 0000  ceFEPIme (MAXIPIME) 1 g in sodium chloride 0.9 % 100 mL IVPB        1 g 200 mL/hr over 30 Minutes Intravenous Every 24 hours 08/18/21 0254     08/18/21 0115  ceFEPIme (MAXIPIME) 1 g in sodium chloride 0.9 % 100 mL IVPB        1 g 200 mL/hr over 30 Minutes Intravenous  Once 08/18/21 0111 08/18/21 0453           Subjective: Doesn't say anything to me today, looks sleepy  Objective: Vitals:   08/19/21 0413 08/19/21 0833 08/19/21 1100 08/19/21 1500  BP:  132/67 (!) 118/49 (!) 118/42  Pulse:  (!) 111 96 63  Resp: (!) 23 (!) 27 (!) 21 20  Temp:  98.2 F (36.8 C)    TempSrc:  Oral    SpO2:   91% 93%  Weight:        Intake/Output Summary (Last 24 hours) at 08/19/2021 1511 Last data filed at 08/19/2021 1118 Gross per 24 hour  Intake 3116.57 ml  Output 8075 ml  Net -4958.43 ml   Filed Weights   08/19/21 0036  Weight: 80.1 kg    Examination:  General: No acute distress, sleepy today Cardiovascular: RRR Lungs: diminished, no appreciable crackles Abdomen: Soft, nontender, nondistended Neurological: sleepy today, doesn't say much Skin: Warm and dry. No rashes or lesions. Extremities: bilateral lower extremity edema   Data Reviewed: I have personally reviewed following labs and imaging studies  CBC: Recent Labs  Lab 08/17/21 1504 08/18/21 0301 08/19/21 0221  WBC 11.4* 11.7* 9.4  NEUTROABS 7.7  --  8.1*  HGB 8.6* 8.5* 7.7*  HCT 27.6* 27.1* 24.0*  MCV 100.0 100.0 99.2  PLT 131* 127* 120*    Basic Metabolic Panel: Recent Labs  Lab 08/17/21 1504 08/18/21 0301 08/19/21 0221  NA 140 139 145  K 4.3 4.5 4.4  CL 110 110 114*  CO2 17* 14* 18*  GLUCOSE 148* 127* 140*  BUN 79* 79* 71*  CREATININE 5.80* 5.76* 5.72*  CALCIUM 9.0 9.2 9.0  MG  --   --  1.8  PHOS  --   --  4.5    GFR: CrCl cannot be calculated (Unknown ideal weight.).  Liver Function Tests: Recent Labs  Lab 08/18/21 0301 08/19/21 0221  AST 20 14*  ALT 13 13  ALKPHOS 398* 357*  BILITOT 0.4 0.8  PROT 7.5 6.6  ALBUMIN 2.7* 2.4*    CBG: No results for input(s): GLUCAP in the last 168 hours.   Recent Results (from the past 240 hour(s))  Urine Culture     Status: Abnormal   Collection Time: 08/17/21 11:16 PM   Specimen: Urine, Catheterized  Result Value Ref Range Status   Specimen Description  URINE, CATHETERIZED  Final   Special Requests   Final    NONE Performed at [facility], [address], [place], [place] [address]    Culture MULTIPLE SPECIES PRESENT, SUGGEST RECOLLECTION (Jaedin Regina)  Final   Report Status 08/18/2021 FINAL  Final  Resp Panel by RT-PCR (Flu Smayan Hackbart&B, Covid) Nasopharyngeal Swab     Status: None   Collection Time: 08/17/21 11:19 PM   Specimen: Nasopharyngeal Swab; Nasopharyngeal(NP) swabs in vial transport medium  Result Value Ref Range Status   SARS Coronavirus 2 by RT PCR NEGATIVE NEGATIVE Final    Comment: (NOTE) SARS-CoV-2 target nucleic  acids are NOT DETECTED.  The SARS-CoV-2 RNA is generally detectable in upper respiratory specimens during the acute phase of infection. The lowest concentration of SARS-CoV-2 viral copies this assay can detect is 138 copies/mL. Donnie Gedeon negative result does not preclude SARS-Cov-2 infection and should not be used as the sole basis for treatment or other patient management decisions. Toriana Sponsel negative result may occur with  improper specimen collection/handling, submission of specimen other than nasopharyngeal swab, presence of viral mutation(s) within the areas targeted by this assay, and inadequate number of viral copies(<138 copies/mL). Jarryd Gratz negative result must be combined with clinical observations, patient history, and epidemiological information. The expected result is Negative.  Fact Sheet for Patients:  EntrepreneurPulse.com.au  Fact Sheet for Healthcare Providers:  IncredibleEmployment.be  This test is no t yet approved or cleared by the [place] FDA and  has been authorized for detection and/or diagnosis of SARS-CoV-2 by FDA under an Emergency Use Authorization (EUA). This EUA will remain  in effect (meaning this test can be used) for the duration of the COVID-19 declaration under Section 564(b)(1) of the Act, 21 U.S.C.section 360bbb-3(b)(1), unless the authorization is terminated   or revoked sooner.       Influenza Fauna Neuner by PCR NEGATIVE NEGATIVE Final   Influenza B by PCR NEGATIVE NEGATIVE Final    Comment: (NOTE) The Xpert Xpress SARS-CoV-2/FLU/RSV plus assay is intended as an aid in the diagnosis of influenza from Nasopharyngeal swab specimens and should not be used as Kayli Beal sole basis for treatment. Nasal washings and aspirates are unacceptable for Xpert Xpress SARS-CoV-2/FLU/RSV testing.  Fact Sheet for Patients: EntrepreneurPulse.com.au  Fact Sheet for Healthcare Providers: IncredibleEmployment.be  This test is not yet approved or cleared by the [place] FDA and has been authorized for detection and/or diagnosis of SARS-CoV-2 by FDA under an Emergency Use Authorization (EUA). This EUA will remain in effect (meaning this test can be used) for the duration of the COVID-19 declaration under Section 564(b)(1) of the Act, 21 U.S.C. section 360bbb-3(b)(1), unless the authorization is terminated or revoked.  Performed at [facility], [place] [address], [place], [place] [address]   Culture, blood (routine x 2)     Status: None (Preliminary result)   Collection Time: 08/18/21  2:55 AM   Specimen: BLOOD  Result Value Ref Range Status   Specimen Description BLOOD RIGHT ANTECUBITAL  Final   Special Requests   Final    BOTTLES DRAWN AEROBIC AND ANAEROBIC Blood Culture adequate volume   Culture   Final    NO GROWTH 1 DAY Performed at [facility], [place] [address], [place], [place] [address]    Report Status PENDING  Incomplete  Culture, blood (routine x 2)     Status: None (Preliminary result)   Collection Time: 08/18/21  3:01 AM   Specimen: BLOOD  Result Value Ref Range Status   Specimen Description BLOOD BLOOD RIGHT WRIST  Final   Special Requests   Final    BOTTLES DRAWN AEROBIC AND ANAEROBIC Blood Culture adequate volume   Culture   Final    NO GROWTH 1 DAY Performed at [facility], [place]  [address] [place], [place] [address]    Report Status PENDING  Incomplete         Radiology Studies: CT ABDOMEN PELVIS WO CONTRAST  Result Date: 08/19/2021 CLINICAL DATA:  Acute renal failure.  Bilateral hydronephrosis. EXAM: CT ABDOMEN AND PELVIS WITHOUT CONTRAST TECHNIQUE: Multidetector CT imaging of the abdomen and pelvis  was performed following the standard protocol without IV contrast. COMPARISON:  12/07/2020 FINDINGS: Lower chest: Trace bilateral pleural effusions. Hepatobiliary: No focal liver abnormality is seen. No gallstones, gallbladder wall thickening, or biliary dilatation. Pancreas: Unremarkable. No pancreatic ductal dilatation or surrounding inflammatory changes. Spleen: Normal in size without focal abnormality. Adrenals/Urinary Tract: Adrenal glands are unremarkable. Moderate bilateral hydronephrosis and hydroureter. 6 mm nonobstructing calculus of the lower pole of the right kidney. 8 mm nonobstructing calculus at the lower pole of the left kidney. 4 mm calculus within the dependent portion of the bladder. The bladder is collapsed around Estus Krakowski Foley catheter balloon which limits evaluation. Stomach/Bowel: No bowel dilatation to indicate ileus or obstruction. Extensive sigmoid and descending colon diverticulosis without evidence of acute diverticulitis. Vascular/Lymphatic: Atherosclerotic calcifications seen throughout the abdominal aorta without aneurysm. No enlarged abdominal pelvic lymph nodes. Reproductive: Calcifications noted within the prostate. Other: No abdominal wall hernia or abnormality. No abdominopelvic ascites. Musculoskeletal: Diffuse osteoblastic metastatic disease again seen. Unchanged moderate to severe compression deformity of the L1 vertebral body. IMPRESSION: Moderate bilateral hydronephrosis and hydroureter. Bilateral nonobstructing renal calculi. Electronically Signed   By: Miachel Roux M.D.   On: 08/19/2021 13:43   DG Cystogram  Result Date: 08/19/2021 CLINICAL DATA:   Surgery, elective. EXAM: CYSTOGRAM TECHNIQUE: Kyrstin Campillo single intraprocedural fluoroscopic image from reported cystoscopy and bladder fulguration is submitted. FLUOROSCOPY TIME:  Fluoroscopy Time:  14 seconds COMPARISON:  Renal ultrasound 08/18/2021. CT abdomen/pelvis 06/06/2021. FINDINGS: Jammal Sarr single intraprocedural fluoroscopic image of the pelvis from reported cystoscopy and bladder fulguration is submitted. On the provided image, Earl Losee cystoscope projects in the region of the pelvis. Correlate with the procedural history. IMPRESSION: Single intraprocedural fluoroscopic image of the pelvis from reported cystoscopy and bladder fulguration. Please correlate with the procedural history. Electronically Signed   By: Kellie Simmering D.O.   On: 08/19/2021 09:36   US Renal  Result Date: 08/18/2021 CLINICAL DATA:  Renal failure EXAM: RENAL / URINARY TRACT ULTRASOUND COMPLETE COMPARISON:  06/06/2021 FINDINGS: Right Kidney: Renal measurements: 10.2 x 5.3 x 5.3 cm. = volume: 150 mL. Moderate hydronephrosis is noted. This has progressed slightly in the interval from the prior CT. Previously seen right renal calculus is not well appreciated on today's exam. Left Kidney: Renal measurements: 9.8 x 5.5 x 4.5 cm. = volume: 128 mL. Moderate left-sided hydronephrosis is noted as well stable in appearance from prior CT. Previously seen renal calculus is not well appreciated on today's exam. Bladder: Foley catheter is noted within the bladder. Other: None. IMPRESSION: Bilateral hydronephrosis left slightly greater than right similar to that seen on prior CT examination. Previously noted renal calculi bilaterally are not well appreciated on today's exam. Electronically Signed   By: Inez Catalina M.D.   On: 08/18/2021 00:30   DG CHEST PORT 1 VIEW  Result Date: 08/19/2021 CLINICAL DATA:  Hypoxia. EXAM: PORTABLE CHEST 1 VIEW COMPARISON:  08/17/2021 FINDINGS: The cardiomediastinal silhouette is unchanged with normal heart size. Aortic  atherosclerosis is noted. Lung volumes are low there are increasing patchy airspace opacities throughout the left lung with milder airspace opacities on the right. There is persistent elevation of the left hemidiaphragm. No large pleural effusion or pneumothorax is identified although the patient's chin partially obscures the lung apices. Widespread sclerotic bone metastases are again noted. IMPRESSION: Increasing left greater than right lung airspace opacities concerning for pneumonia. Electronically Signed   By: Logan Bores M.D.   On: 08/19/2021 14:58   DG Chest Port 1 View  Result Date: 08/17/2021  CLINICAL DATA:  Shortness of breath EXAM: PORTABLE CHEST 1 VIEW COMPARISON:  06/06/2021, CT 06/06/2021 FINDINGS: Chronic scarring in the left upper lobe. No acute airspace disease or pleural effusion. Stable cardiomediastinal silhouette with aortic atherosclerosis. Patchy bilateral skeletal sclerosis consistent with osseous metastatic disease. IMPRESSION: 1. Scarring in the left upper lobe.  No acute airspace disease. 2. Diffuse rib sclerosis consistent with skeletal metastatic disease Electronically Signed   By: Donavan Foil M.D.   On: 08/17/2021 23:41        Scheduled Meds:  Chlorhexidine Gluconate Cloth  6 each Topical Daily   degarelix  240 mg Subcutaneous Once   LORazepam  0.5 mg Intravenous Once   oxybutynin  5 mg Oral QHS   tamsulosin  0.4 mg Oral Daily   Continuous Infusions:   ceFAZolin (ANCEF) IV     ceFEPime (MAXIPIME) IV Stopped (08/19/21 0209)    sodium bicarbonate (isotonic) infusion in sterile water 100 mL/hr at 08/19/21 1409   sodium chloride irrigation       LOS: 1 day    Time spent: over 30 min 40 min critical care with AKI on CKD, tachypnea, tachycardia - c/w sepsis 2/2 UTI    Fayrene Helper, MD [facility]   To contact the attending provider between 7A-7P or the covering provider during after hours 7P-7A, please log into the web site [email or web address] and  access using universal [place] password for that web site. If you do not have the password, please call the hospital operator.  08/19/2021, 3:11 PM

## 2021-08-19 NOTE — Progress Notes (Signed)
   08/19/21 2000  Assess: MEWS Score  BP 112/80  Pulse Rate (!) 125  ECG Heart Rate (!) 125  Resp (!) 27  SpO2 95 %  O2 Device Nasal Cannula  O2 Flow Rate (L/min) 5 L/min  Assess: MEWS Score  MEWS Temp 0  MEWS Systolic 0  MEWS Pulse 2  MEWS RR 2  MEWS LOC 1  MEWS Score 5  MEWS Score Color Red  Assess: if the MEWS score is Yellow or Red  Were vital signs taken at a resting state? No  Focused Assessment No change from prior assessment  Early Detection of Sepsis Score *See Row Information* Low  MEWS guidelines implemented *See Row Information* No, other (Comment) (pt has had HR and R up, pt was just given Ativan to help calm him down)  Document  Patient Outcome Not stable and remains on department  Progress note created (see row info) Yes

## 2021-08-19 NOTE — Progress Notes (Signed)
SLP Cancellation Note  Patient Details Name: Anthony Dudley MRN: [id number] DOB: 1933/02/14   Cancelled treatment:       Reason Eval/Treat Not Completed: Patient at procedure or test/unavailable (Pt is currently NPO for procedure. SLP will follow up on subsequent date.)  Quinterious Walraven I. Hardin Negus, [place], [place] Office number [phone] Pager [place] 08/19/2021, 3:20 PM

## 2021-08-19 NOTE — Sedation Documentation (Signed)
Patient was placed in the prone position for placement of bilateral nephrostomy tubes.  Patient was tachycardic with in the prone position but able to maintain his oxygen on 6L at 96%.  Once patient place back in his bed, patient heart rate stabilized back in to the 120's.

## 2021-08-19 NOTE — Consult Note (Signed)
Chief Complaint: Patient was seen in consultation today for bilateral percutaneous nephrostomies Chief Complaint  Patient presents with   Acute Renal Failure   Abnormal Lab    Referring Physician(s): Borden,L  Supervising Physician: Mir, Sharen Heck  [facility]  History of Present Illness: Anthony Dudley is an 87 y.o. male with history of dementia, hematuria and metastatic prostate cancer with urethral erosion/urinary retention and Foley catheter in place who presents now with bilateral hydronephrosis, acute kidney injury and sepsis.  Patient underwent unsuccessful ureteral stenting during cystoscopy yesterday secondary to local tumor growth.  Request now received from urology for bilateral percutaneous nephrostomies.  Current labs include WBC 9.4, hemoglobin 7.7, platelets 120k, creatinine 5.72, PT 16.4, INR 1.3.  Patient is afebrile however O2 sats are in the upper 80s on 5 L nasal cannula.  Chest x-ray pending.  He is on IV Maxipime.  History reviewed. No pertinent past medical history.  Past Surgical History:  Procedure Laterality Date   CYSTOSCOPY WITH RETROGRADE PYELOGRAM, URETEROSCOPY AND STENT PLACEMENT Bilateral 08/18/2021   Procedure: 1.  Cystoscopy 2.  Fulguration of bladder;  Surgeon: Raynelle Bring, MD;  Location: [facility];  Service: Urology;  Laterality: Bilateral;    Allergies: Solanum lycopersicum [tomato]  Medications: Prior to Admission medications   Medication Sig Start Date End Date Taking? Authorizing Provider  Cholecalciferol (VITAMIN D-3) 125 MCG (5000 UT) TABS Take 5,000 Units by mouth daily.   Yes [provider]  ipratropium-albuterol (DUONEB) 0.5-2.5 (3) MG/3ML SOLN Take 3 mLs by nebulization every 4 (four) hours as needed (shortness of breath).   Yes [provider]  metoprolol tartrate (LOPRESSOR) 25 MG tablet Take 12.5 mg by mouth 2 (two) times daily.   Yes [provider]  Multiple Vitamins-Iron  (MULTI-VITAMIN/IRON) TABS Take 1 tablet by mouth in the morning and at bedtime.   Yes [provider]  niacinamide 500 MG tablet Take 500 mg by mouth daily.   Yes [provider]  Nutritional Supplements (BOOST BREEZE PO) Take 1 Bottle by mouth 3 (three) times daily.   Yes [provider]  OXYGEN Inhale into the lungs See admin instructions. Put on oxygen if O2 is less than 90% every shift   Yes [provider]  sodium bicarbonate 650 MG tablet Take 650 mg by mouth daily.   Yes [provider]  tamsulosin (FLOMAX) 0.4 MG CAPS capsule Take 0.4 mg by mouth daily.   Yes [provider]  tolterodine (DETROL) 1 MG tablet Take 1 mg by mouth 2 (two) times daily.   Yes [provider]  vitamin B-12 (CYANOCOBALAMIN) 1000 MCG tablet Take 2,000 mcg by mouth daily.   Yes [provider]     History reviewed. No pertinent family history.  Social History   Socioeconomic History   Marital status: Single    Spouse name: Not on file   Number of children: Not on file   Years of education: Not on file   Highest education level: Not on file  Occupational History   Not on file  Tobacco Use   Smoking status: Not on file   Smokeless tobacco: Not on file  Substance and Sexual Activity   Alcohol use: Not on file   Drug use: Not on file   Sexual activity: Not on file  Other Topics Concern   Not on file  Social History Narrative   Not on file   Social Determinants of Health   Financial Resource Strain: Not  on file  Food Insecurity: Not on file  Transportation Needs: Not on file  Physical Activity: Not on file  Stress: Not on file  Social Connections: Not on file      Review of Systems pt sleeping with hx dementia; unable to obtain; nursing reports no fevers, sig pain,N/V  Vital Signs: BP (!) 118/49 (BP Location: Right Arm)   Pulse 96   Temp 98.2 F (36.8 C) (Oral)   Resp (!) 21   Wt 176 lb 9.4 oz (80.1 kg)   SpO2 91%    Physical Exam pt with eyes closed, asleep, arousable; in wrist mittens; chest - sl dim BS bases; heart- RRR; abd- soft,+BS,NT; no LE edema, SCD's in place  Imaging: DG Cystogram  Result Date: 08/19/2021 CLINICAL DATA:  Surgery, elective. EXAM: CYSTOGRAM TECHNIQUE: A single intraprocedural fluoroscopic image from reported cystoscopy and bladder fulguration is submitted. FLUOROSCOPY TIME:  Fluoroscopy Time:  14 seconds COMPARISON:  Renal ultrasound 08/18/2021. CT abdomen/pelvis 06/06/2021. FINDINGS: A single intraprocedural fluoroscopic image of the pelvis from reported cystoscopy and bladder fulguration is submitted. On the provided image, a cystoscope projects in the region of the pelvis. Correlate with the procedural history. IMPRESSION: Single intraprocedural fluoroscopic image of the pelvis from reported cystoscopy and bladder fulguration. Please correlate with the procedural history. Electronically Signed   By: Kellie Simmering D.O.   On: 08/19/2021 09:36   US Renal  Result Date: 08/18/2021 CLINICAL DATA:  Renal failure EXAM: RENAL / URINARY TRACT ULTRASOUND COMPLETE COMPARISON:  06/06/2021 FINDINGS: Right Kidney: Renal measurements: 10.2 x 5.3 x 5.3 cm. = volume: 150 mL. Moderate hydronephrosis is noted. This has progressed slightly in the interval from the prior CT. Previously seen right renal calculus is not well appreciated on today's exam. Left Kidney: Renal measurements: 9.8 x 5.5 x 4.5 cm. = volume: 128 mL. Moderate left-sided hydronephrosis is noted as well stable in appearance from prior CT. Previously seen renal calculus is not well appreciated on today's exam. Bladder: Foley catheter is noted within the bladder. Other: None. IMPRESSION: Bilateral hydronephrosis left slightly greater than right similar to that seen on prior CT examination. Previously noted renal calculi bilaterally are not well appreciated on today's exam. Electronically Signed   By: Inez Catalina M.D.   On: 08/18/2021 00:30    DG Chest Port 1 View  Result Date: 08/17/2021 CLINICAL DATA:  Shortness of breath EXAM: PORTABLE CHEST 1 VIEW COMPARISON:  06/06/2021, CT 06/06/2021 FINDINGS: Chronic scarring in the left upper lobe. No acute airspace disease or pleural effusion. Stable cardiomediastinal silhouette with aortic atherosclerosis. Patchy bilateral skeletal sclerosis consistent with osseous metastatic disease. IMPRESSION: 1. Scarring in the left upper lobe.  No acute airspace disease. 2. Diffuse rib sclerosis consistent with skeletal metastatic disease Electronically Signed   By: Donavan Foil M.D.   On: 08/17/2021 23:41    Labs:  CBC: Recent Labs    08/17/21 1504 08/18/21 0301 08/19/21 0221  WBC 11.4* 11.7* 9.4  HGB 8.6* 8.5* 7.7*  HCT 27.6* 27.1* 24.0*  PLT 131* 127* 120*    COAGS: Recent Labs    08/18/21 0301  INR 1.3*    BMP: Recent Labs    08/17/21 1504 08/18/21 0301 08/19/21 0221  NA 140 139 145  K 4.3 4.5 4.4  CL 110 110 114*  CO2 17* 14* 18*  GLUCOSE 148* 127* 140*  BUN 79* 79* 71*  CALCIUM 9.0 9.2 9.0  CREATININE 5.80* 5.76* 5.72*  GFRNONAA 9* 9* 9*  LIVER FUNCTION TESTS: Recent Labs    08/18/21 0301 08/19/21 0221  BILITOT 0.4 0.8  AST 20 14*  ALT 13 13  ALKPHOS 398* 357*  PROT 7.5 6.6  ALBUMIN 2.7* 2.4*    TUMOR MARKERS: No results for input(s): AFPTM, CEA, CA199, CHROMGRNA in the last 8760 hours.  Assessment and Plan: 87 y.o. male with history of dementia, hematuria and metastatic prostate cancer with urethral erosion/urinary retention and Foley catheter in place who presents now with bilateral hydronephrosis, acute kidney injury and sepsis.  Patient underwent unsuccessful ureteral stenting during cystoscopy yesterday secondary to local tumor growth.  Request now received from urology for bilateral percutaneous nephrostomies.  Current labs include WBC 9.4, hemoglobin 7.7, platelets 120k, creatinine 5.72, PT 16.4, INR 1.3.  Patient is afebrile however O2 sats are  in the upper 80s on 5 L nasal cannula.  Chest x-ray pending.  He is on IV Maxipime.  Imaging studies have been reviewed by Dr. Dwaine Gale.Risks and benefits of bilateral PCN placement was discussed with the patient's brother Anthony Dudley including, but not limited to, infection, bleeding, significant bleeding causing loss or decrease in renal function or damage to adjacent structures.   All of the patient's questions were answered, patient is agreeable to proceed.  Consent signed and in chart.  Procedure tent scheduled for later this afternoon    Thank you for this interesting consult.  I greatly enjoyed meeting Anthony Dudley and look forward to participating in their care.  A copy of this report was sent to the requesting provider on this date.  Electronically Signed: D. Rowe Robert, PA-C 08/19/2021, 1:28 PM   I spent a total of   25 minutes  in face to face in clinical consultation, greater than 50% of which was counseling/coordinating care for bilateral percutaneous nephrostomies

## 2021-08-20 ENCOUNTER — Other Ambulatory Visit (HOSPITAL_COMMUNITY): Payer: Medicare Other

## 2021-08-20 ENCOUNTER — Inpatient Hospital Stay (HOSPITAL_COMMUNITY): Payer: Medicare Other

## 2021-08-20 DIAGNOSIS — A419 Sepsis, unspecified organism: Secondary | ICD-10-CM | POA: Diagnosis not present

## 2021-08-20 DIAGNOSIS — N183 Chronic kidney disease, stage 3 unspecified: Secondary | ICD-10-CM | POA: Diagnosis not present

## 2021-08-20 DIAGNOSIS — Z9289 Personal history of other medical treatment: Secondary | ICD-10-CM

## 2021-08-20 DIAGNOSIS — R6521 Severe sepsis with septic shock: Secondary | ICD-10-CM | POA: Diagnosis not present

## 2021-08-20 DIAGNOSIS — N179 Acute kidney failure, unspecified: Secondary | ICD-10-CM | POA: Diagnosis not present

## 2021-08-20 HISTORY — DX: Personal history of other medical treatment: Z92.89

## 2021-08-20 LAB — BPAM RBC
Blood Product Expiration Date: 202211012359
Unit Type and Rh: 5100

## 2021-08-20 LAB — COMPREHENSIVE METABOLIC PANEL
ALT: 7 U/L (ref 0–44)
AST: 24 U/L (ref 15–41)
Albumin: 2.2 g/dL — ABNORMAL LOW (ref 3.5–5.0)
Alkaline Phosphatase: 294 U/L — ABNORMAL HIGH (ref 38–126)
Anion gap: 17 — ABNORMAL HIGH (ref 5–15)
BUN: 78 mg/dL — ABNORMAL HIGH (ref 8–23)
CO2: 17 mmol/L — ABNORMAL LOW (ref 22–32)
Calcium: 8.3 mg/dL — ABNORMAL LOW (ref 8.9–10.3)
Chloride: 112 mmol/L — ABNORMAL HIGH (ref 98–111)
Creatinine, Ser: 6.35 mg/dL — ABNORMAL HIGH (ref 0.61–1.24)
GFR, Estimated: 8 mL/min — ABNORMAL LOW (ref >60)
Glucose, Bld: 111 mg/dL — ABNORMAL HIGH (ref 70–99)
Potassium: 4.8 mmol/L (ref 3.5–5.1)
Sodium: 146 mmol/L — ABNORMAL HIGH (ref 135–145)
Total Bilirubin: 0.9 mg/dL (ref 0.3–1.2)
Total Protein: 6 g/dL — ABNORMAL LOW (ref 6.5–8.1)

## 2021-08-20 LAB — CBC WITH DIFFERENTIAL/PLATELET
Abs Immature Granulocytes: 0.13 10*3/uL — ABNORMAL HIGH (ref 0.00–0.07)
Basophils Absolute: 0 10*3/uL (ref 0.0–0.1)
Basophils Relative: 0 %
Eosinophils Absolute: 0 10*3/uL (ref 0.0–0.5)
Eosinophils Relative: 0 %
HCT: 20.5 % — ABNORMAL LOW (ref 39.0–52.0)
Hemoglobin: 6.6 g/dL — CL (ref 13.0–17.0)
Immature Granulocytes: 1 %
Lymphocytes Relative: 4 %
Lymphs Abs: 0.7 10*3/uL (ref 0.7–4.0)
MCH: 32.7 pg (ref 26.0–34.0)
MCHC: 32.2 g/dL (ref 30.0–36.0)
MCV: 101.5 fL — ABNORMAL HIGH (ref 80.0–100.0)
Monocytes Absolute: 0.6 10*3/uL (ref 0.1–1.0)
Monocytes Relative: 3 %
Neutro Abs: 15.8 10*3/uL — ABNORMAL HIGH (ref 1.7–7.7)
Neutrophils Relative %: 92 %
Platelets: 129 10*3/uL — ABNORMAL LOW (ref 150–400)
RBC: 2.02 MIL/uL — ABNORMAL LOW (ref 4.22–5.81)
RDW: 17.3 % — ABNORMAL HIGH (ref 11.5–15.5)
WBC: 17.2 10*3/uL — ABNORMAL HIGH (ref 4.0–10.5)
nRBC: 0.1 % (ref 0.0–0.2)

## 2021-08-20 LAB — HEMOGLOBIN AND HEMATOCRIT, BLOOD
HCT: 22.5 % — ABNORMAL LOW (ref 39.0–52.0)
HCT: 25.3 % — ABNORMAL LOW (ref 39.0–52.0)
Hemoglobin: 7.1 g/dL — ABNORMAL LOW (ref 13.0–17.0)
Hemoglobin: 8.4 g/dL — ABNORMAL LOW (ref 13.0–17.0)

## 2021-08-20 LAB — TYPE AND SCREEN
ABO/RH(D): O POS
Antibody Screen: NEGATIVE
Unit division: 0

## 2021-08-20 LAB — PREPARE RBC (CROSSMATCH)

## 2021-08-20 LAB — PHOSPHORUS: Phosphorus: 4.2 mg/dL (ref 2.5–4.6)

## 2021-08-20 LAB — MAGNESIUM: Magnesium: 1.6 mg/dL — ABNORMAL LOW (ref 1.7–2.4)

## 2021-08-20 LAB — LACTIC ACID, PLASMA
Lactic Acid, Venous: 1 mmol/L (ref 0.5–1.9)
Lactic Acid, Venous: 0.9 mmol/L (ref 0.5–1.9)

## 2021-08-20 LAB — TSH: TSH: 1.418 u[IU]/mL (ref 0.350–4.500)

## 2021-08-20 LAB — TESTOSTERONE: Testosterone: 39 ng/dL — ABNORMAL LOW (ref 264–916)

## 2021-08-20 MED ORDER — ACETAMINOPHEN 325 MG RE SUPP
650.0000 mg | Freq: Once | RECTAL | Status: AC
  Administered 2021-08-20: 650 mg via RECTAL
  Filled 2021-08-20: qty 2

## 2021-08-20 MED ORDER — MAGNESIUM OXIDE -MG SUPPLEMENT 400 (240 MG) MG PO TABS
400.0000 mg | ORAL_TABLET | Freq: Two times a day (BID) | ORAL | Status: DC
  Administered 2021-08-20 – 2021-09-06 (×27): 400 mg via ORAL
  Filled 2021-08-20 (×27): qty 1

## 2021-08-20 MED ORDER — AMIODARONE HCL IN DEXTROSE 360-4.14 MG/200ML-% IV SOLN
60.0000 mg/h | INTRAVENOUS | Status: AC
  Administered 2021-08-20 (×2): 60 mg/h via INTRAVENOUS
  Filled 2021-08-20: qty 200

## 2021-08-20 MED ORDER — PHENYLEPHRINE HCL-NACL 20-0.9 MG/250ML-% IV SOLN
0.0000 ug/min | INTRAVENOUS | Status: DC
  Filled 2021-08-20: qty 250

## 2021-08-20 MED ORDER — AMIODARONE HCL IN DEXTROSE 360-4.14 MG/200ML-% IV SOLN
30.0000 mg/h | INTRAVENOUS | Status: DC
  Administered 2021-08-20 – 2021-08-24 (×10): 30 mg/h via INTRAVENOUS
  Filled 2021-08-20 (×10): qty 200

## 2021-08-20 MED ORDER — FOOD THICKENER (SIMPLYTHICK HONEY)
1.0000 | ORAL | Status: DC | PRN
  Filled 2021-08-20: qty 1

## 2021-08-20 MED ORDER — LACTATED RINGERS IV BOLUS: 500.0000 mL | Freq: Once | INTRAVENOUS | Status: DC

## 2021-08-20 MED ORDER — LACTATED RINGERS IV SOLN: INTRAVENOUS | Status: AC

## 2021-08-20 MED ORDER — FUROSEMIDE 10 MG/ML IJ SOLN
20.0000 mg | Freq: Once | INTRAMUSCULAR | Status: AC
  Administered 2021-08-20: 20 mg via INTRAVENOUS
  Filled 2021-08-20: qty 2

## 2021-08-20 MED ORDER — SODIUM CHLORIDE 0.9% IV SOLUTION: Freq: Once | INTRAVENOUS | Status: DC

## 2021-08-20 MED ORDER — DIPHENHYDRAMINE HCL 50 MG/ML IJ SOLN
25.0000 mg | Freq: Once | INTRAMUSCULAR | Status: AC
  Administered 2021-08-20: 25 mg via INTRAVENOUS
  Filled 2021-08-20: qty 1

## 2021-08-20 MED ORDER — ACETAMINOPHEN 325 MG PO TABS
650.0000 mg | ORAL_TABLET | Freq: Once | ORAL | Status: DC
  Filled 2021-08-20: qty 2

## 2021-08-20 MED ORDER — FENTANYL CITRATE PF 50 MCG/ML IJ SOSY
12.5000 ug | PREFILLED_SYRINGE | INTRAMUSCULAR | Status: DC | PRN
Start: 2021-08-20 — End: 2021-09-06
  Administered 2021-08-20 – 2021-08-21 (×3): 12.5 ug via INTRAVENOUS
  Filled 2021-08-20 (×3): qty 1

## 2021-08-20 MED ORDER — AMIODARONE LOAD VIA INFUSION
150.0000 mg | Freq: Once | INTRAVENOUS | Status: AC
  Administered 2021-08-20: 150 mg via INTRAVENOUS
  Filled 2021-08-20: qty 83.34

## 2021-08-20 MED ORDER — SODIUM CHLORIDE 0.9 % IV SOLN
1.0000 g | INTRAVENOUS | Status: DC
  Administered 2021-08-20 – 2021-08-21 (×2): 1 g via INTRAVENOUS
  Filled 2021-08-20 (×3): qty 1

## 2021-08-20 MED ORDER — LORAZEPAM 2 MG/ML IJ SOLN
0.5000 mg | Freq: Four times a day (QID) | INTRAMUSCULAR | Status: DC | PRN
  Administered 2021-08-20 – 2021-09-05 (×4): 0.5 mg via INTRAVENOUS
  Filled 2021-08-20 (×4): qty 1

## 2021-08-20 NOTE — Progress Notes (Addendum)
Notified by RN that pt has developed tachycardia in 150-170 range. EKG obtained and shows SVT. BP was lower earlier in evening but with IVF bolus the BP has improved. Pt agitated and was given ativan at beginning of shift.  Started on Amiodarone for SVT.  Discussed with Cardiology, Dr. Terri Skains, who will see in consultation.   Addendum: Shortly after starting amiodarone, BP dropped. Given more IVF bolus and amiodarone stopped.  Discussed with cardiology who feels SVT secondary to AKI and sepsis  Consult PCCM. Pt started on neosynephrine for pressor support.

## 2021-08-20 NOTE — Progress Notes (Signed)
   08/20/21 0345  Assess: MEWS Score  BP (!) 88/46  O2 Device Nasal Cannula  O2 Flow Rate (L/min) 4 L/min  Assess: MEWS Score  MEWS Temp 0  MEWS Systolic 1  MEWS Pulse 3  MEWS RR 2  MEWS LOC 0  MEWS Score 6  MEWS Score Color Red  Assess: if the MEWS score is Yellow or Red  Were vital signs taken at a resting state? Yes  Focused Assessment Change from prior assessment (see assessment flowsheet)  Early Detection of Sepsis Score *See Row Information* Medium  MEWS guidelines implemented *See Row Information* No, previously red, continue vital signs every 4 hours (improved BP)  Document  Patient Outcome Stabilized after interventions  Progress note created (see row info) Yes

## 2021-08-20 NOTE — Plan of Care (Signed)
  Problem: Clinical Measurements: Goal: Ability to maintain clinical measurements within normal limits will improve Outcome: Progressing   Problem: Clinical Measurements: Goal: Diagnostic test results will improve Outcome: Progressing   Problem: Clinical Measurements: Goal: Respiratory complications will improve Outcome: Progressing   Problem: Clinical Measurements: Goal: Cardiovascular complication will be avoided Outcome: Progressing   Problem: Activity: Goal: Risk for activity intolerance will decrease Outcome: Progressing   Problem: Nutrition: Goal: Adequate nutrition will be maintained Outcome: Progressing   Problem: Coping: Goal: Level of anxiety will decrease Outcome: Progressing   Problem: Elimination: Goal: Will not experience complications related to bowel motility Outcome: Progressing   Problem: Elimination: Goal: Will not experience complications related to urinary retention Outcome: Progressing   Problem: Pain Managment: Goal: General experience of comfort will improve Outcome: Progressing   Problem: Safety: Goal: Ability to remain free from injury will improve Outcome: Progressing   Problem: Skin Integrity: Goal: Risk for impaired skin integrity will decrease Outcome: Progressing   Problem: Fluid Volume: Goal: Compliance with measures to maintain balanced fluid volume will improve Outcome: Progressing

## 2021-08-20 NOTE — Progress Notes (Signed)
Pt is now sustaining in the 160-170's, ECG confirmed SVT, BP has been an issue all night, MD aware.  It is now 100/79, but before in the 80's-90's.  Pt is agitated at times, given Ativan at the start of the shift.  Pt is on CBI and has 2 nephrostomy tubes.  Thanks Arvella Nigh RN.

## 2021-08-20 NOTE — Progress Notes (Signed)
Pt brother called.  Said he had received a voicemail from someone on the 4th floor and was attempting to return the call.  I'm not sure what it was regarding.  He's in a remote area and service not great.  But standing by to be able to talk if anyone needs him.

## 2021-08-20 NOTE — Progress Notes (Signed)
Patient ID: Anthony Dudley, male   DOB: 10-Dec-1932, 87 y.o.   MRN: [id number]  2 Days Post-Op Subjective: Pt s/p bilateral nephrostomy tube placement yesterday.  Hgb dropped overnight and he developed intermittently SVT with hemodynamic instability.  Hgb 6.6 and he is to receive blood transfusion.    Objective: Vital signs in last 24 hours: Temp:  [98.2 F (36.8 C)-99.2 F (37.3 C)] 98.9 F (37.2 C) (10/07 0724) Pulse Rate:  [63-166] 106 (10/07 0724) Resp:  [17-30] 20 (10/07 0724) BP: (62-132)/(35-81) 104/55 (10/07 0724) SpO2:  [90 %-97 %] 95 % (10/07 0724) Weight:  [81.3 kg-83.2 kg] 83.2 kg (10/07 0700)  Intake/Output from previous day: 10/06 0701 - 10/07 0700 In: 913.7 [I.V.:810.1; IV Piggyback:103.6] Out: 40981 [Urine:11115] Intake/Output this shift: No intake/output data recorded.  Physical Exam:  General: Alert and oriented GU: Bilateral nephrostomy tubes with large amount of UOP, red tinged but draining well, Urethral catheter with minimal output, clear  Lab Results: Recent Labs    08/19/21 0221 08/19/21 1811 08/20/21 0308  HGB 7.7* 8.1* 6.6*  HCT 24.0* 26.8* 20.5*   BMET Recent Labs    08/19/21 0221 08/19/21 2219 08/20/21 0308  NA 145  --  146*  K 4.4  --  4.8  CL 114*  --  112*  CO2 18*  --  17*  GLUCOSE 140*  --  111*  BUN 71*  --  78*  CREATININE 5.72* 6.62* 6.35*  CALCIUM 9.0  --  8.3*     Studies/Results: CT ABDOMEN PELVIS WO CONTRAST  Result Date: 08/19/2021 CLINICAL DATA:  Acute renal failure.  Bilateral hydronephrosis. EXAM: CT ABDOMEN AND PELVIS WITHOUT CONTRAST TECHNIQUE: Multidetector CT imaging of the abdomen and pelvis was performed following the standard protocol without IV contrast. COMPARISON:  12/07/2020 FINDINGS: Lower chest: Trace bilateral pleural effusions. Hepatobiliary: No focal liver abnormality is seen. No gallstones, gallbladder wall thickening, or biliary dilatation. Pancreas: Unremarkable. No pancreatic ductal dilatation or  surrounding inflammatory changes. Spleen: Normal in size without focal abnormality. Adrenals/Urinary Tract: Adrenal glands are unremarkable. Moderate bilateral hydronephrosis and hydroureter. 6 mm nonobstructing calculus of the lower pole of the right kidney. 8 mm nonobstructing calculus at the lower pole of the left kidney. 4 mm calculus within the dependent portion of the bladder. The bladder is collapsed around a Foley catheter balloon which limits evaluation. Stomach/Bowel: No bowel dilatation to indicate ileus or obstruction. Extensive sigmoid and descending colon diverticulosis without evidence of acute diverticulitis. Vascular/Lymphatic: Atherosclerotic calcifications seen throughout the abdominal aorta without aneurysm. No enlarged abdominal pelvic lymph nodes. Reproductive: Calcifications noted within the prostate. Other: No abdominal wall hernia or abnormality. No abdominopelvic ascites. Musculoskeletal: Diffuse osteoblastic metastatic disease again seen. Unchanged moderate to severe compression deformity of the L1 vertebral body. IMPRESSION: Moderate bilateral hydronephrosis and hydroureter. Bilateral nonobstructing renal calculi. Electronically Signed   By: Miachel Roux M.D.   On: 08/19/2021 13:43   DG Cystogram  Result Date: 08/19/2021 CLINICAL DATA:  Surgery, elective. EXAM: CYSTOGRAM TECHNIQUE: A single intraprocedural fluoroscopic image from reported cystoscopy and bladder fulguration is submitted. FLUOROSCOPY TIME:  Fluoroscopy Time:  14 seconds COMPARISON:  Renal ultrasound 08/18/2021. CT abdomen/pelvis 06/06/2021. FINDINGS: A single intraprocedural fluoroscopic image of the pelvis from reported cystoscopy and bladder fulguration is submitted. On the provided image, a cystoscope projects in the region of the pelvis. Correlate with the procedural history. IMPRESSION: Single intraprocedural fluoroscopic image of the pelvis from reported cystoscopy and bladder fulguration. Please correlate with  the procedural history. Electronically Signed  By: Kellie Simmering D.O.   On: 08/19/2021 09:36   DG CHEST PORT 1 VIEW  Result Date: 08/19/2021 CLINICAL DATA:  Hypoxia. EXAM: PORTABLE CHEST 1 VIEW COMPARISON:  08/17/2021 FINDINGS: The cardiomediastinal silhouette is unchanged with normal heart size. Aortic atherosclerosis is noted. Lung volumes are low there are increasing patchy airspace opacities throughout the left lung with milder airspace opacities on the right. There is persistent elevation of the left hemidiaphragm. No large pleural effusion or pneumothorax is identified although the patient's chin partially obscures the lung apices. Widespread sclerotic bone metastases are again noted. IMPRESSION: Increasing left greater than right lung airspace opacities concerning for pneumonia. Electronically Signed   By: Logan Bores M.D.   On: 08/19/2021 14:58    Assessment/Plan: Bilateral hydronephrosis/AKI/sepsis: Continue antibiotic therapy/supportive care. S/P bilateral nephrostomy tube placement yesterday.  Renal function not improved but very likely due to pre-renal causes secondary to hypotension from SVT/bleeding.  Continue nephrostomy drainage with hopes to internalize to antegrade ureteral stents possibly early next week once stable. Hematuria: Resolved from bladder.  Will stop CBI.  Hematuria in nephrostomy tubes related to placement and should resolve.  Agree with transfusion and close hemodynamic monitoring.  Expect this to resolve with supportive care. Metastatic prostate cancer: PSA significantly elevated consistent with probable widespread metastatic prostate cancer. Testosterone 39. CT scan yesterday without lymphadenopathy but with extensive sclerotic bone metastases.  Will consider bone scan once acute issues stabilized to further assess extent of disease.  Will need treatment escalation eventually with androgen receptor blockade/androgen synthesis inhibition as outpatient. Urethral  erosion/urinary retention:  Continue Foley for now.  Does not need to continue tamsulosin.  Will consider SP tube placement eventually (likely at time of next stent change when he gets general anesthesia).  Will likely have minimal output from urethral catheter with nephrostomy tubes in place.     LOS: 2 days   Dutch Gray 08/20/2021, 7:54 AM

## 2021-08-20 NOTE — Progress Notes (Addendum)
ON-CALL CARDIOLOGY 08/20/21  Patient's name: Anthony Dudley.   MRN: [id number].    DOB: 1932-12-23 Primary care provider: [name], MD. Primary cardiologist: NA  Interaction regarding this patient's care today: Called by hospitalist Dr. Tonie Griffith this morning for consult regarding SVT.   Review of his chart notes that he comes from nursing facility w/ chronic indwelling catheter and  acute kidney injury with bilateral hydronephrosis underwent bilateral nephrostomy tubes during this hospitalization.    He has history of prostate cancer w/ mets as well and hx of MDR UTI in the past.   Earlier in the evening he was noted to tachycardia and hypertension. Started on IVF and responded to it. However, since is HR remained elevated EKG was performed and interpreted as SVT by our hospitalist colleagues (EKG has not crossed over to Epic to review - primary team aware).  Patient was started on IV amiodarone and cardiology was consulted for further recommendations.  Upon reviewing the chart I suspect that he may have underlying sepsis given his presentation, acute kidney injury, given his past history, and clinical trajectory.  Recommend continuing IV fluids, checking lactic acid and labs again, and consulting ICU for possible sepsis evaluation and management.  Recommendation discussed with attending physician.  Cardiology will follow and see him in consultation as well.  No charge & full consult forthcoming.   Rex Kras, [place], [facility]  Pager: [phone] Office: [phone]

## 2021-08-20 NOTE — Progress Notes (Signed)
Referring Physician(s): * No referring provider recorded for this case *  Supervising Physician: Mir, Sharen Heck  Patient Status:  [facility] - In-pt  Chief Complaint:  History of prostate cancer with chronic indwelling Foley, CKD, UTI, dementia, hypertension presented with urosepsis.  Patient found to have AKI on CKD with bilateral hydronephrosis.  Urology was unable to place retrograde ureteral stent. IR placed bilateral nephrostomy tubes for hydronephrosis 08/19/2021.  Subjective:  Patient observed to be resting in bed.  He reacts to touch but does not open his eyes or speak.  Due to history of dementia patient is poor historian.   -Right nephrostomy tube insertion site unremarkable with sutures and StatLock in place.  No redness, drainage or other signs of infection noted.  Right JP drain has 300 mL red-colored urine with 1145 mL documented in epic for the past 24 hours. Dressing has small amount of dried blood. -Left nephrostomy tube insertion site unremarkable with sutures and StatLock in place.  No redness, drainage or other signs of infection noted.  Left JP drain has approximately 50 mL red-colored urine with 425 mL documented in epic for the past 24 hours.  Dressing has small amount of dried blood.  Patient's vital signs are stable.   He is afebrile, white blood count 17.2. Creatinine has slightly decreased 6.35 from 6.65 yesterday.  BUN 78  GFR is 8. He is in no distress  Allergies: Solanum lycopersicum [tomato]  Medications: Prior to Admission medications   Medication Sig Start Date End Date Taking? Authorizing Provider  Cholecalciferol (VITAMIN D-3) 125 MCG (5000 UT) TABS Take 5,000 Units by mouth daily.   Yes [provider]  ipratropium-albuterol (DUONEB) 0.5-2.5 (3) MG/3ML SOLN Take 3 mLs by nebulization every 4 (four) hours as needed (shortness of breath).   Yes [provider]  metoprolol tartrate (LOPRESSOR) 25 MG tablet Take 12.5 mg by mouth 2 (two)  times daily.   Yes [provider]  Multiple Vitamins-Iron (MULTI-VITAMIN/IRON) TABS Take 1 tablet by mouth in the morning and at bedtime.   Yes [provider]  niacinamide 500 MG tablet Take 500 mg by mouth daily.   Yes [provider]  Nutritional Supplements (BOOST BREEZE PO) Take 1 Bottle by mouth 3 (three) times daily.   Yes [provider]  OXYGEN Inhale into the lungs See admin instructions. Put on oxygen if O2 is less than 90% every shift   Yes [provider]  sodium bicarbonate 650 MG tablet Take 650 mg by mouth daily.   Yes [provider]  tamsulosin (FLOMAX) 0.4 MG CAPS capsule Take 0.4 mg by mouth daily.   Yes [provider]  tolterodine (DETROL) 1 MG tablet Take 1 mg by mouth 2 (two) times daily.   Yes [provider]  vitamin B-12 (CYANOCOBALAMIN) 1000 MCG tablet Take 2,000 mcg by mouth daily.   Yes [provider]     Vital Signs: BP (!) 110/53 (BP Location: Left Arm)   Pulse 93   Temp 98 F (36.7 C) (Axillary)   Resp 20   Wt 183 lb 6.8 oz (83.2 kg)   SpO2 95%   Physical Exam Vitals reviewed.  Constitutional:      Appearance: He is ill-appearing.  HENT:     Head: Normocephalic and atraumatic.  Cardiovascular:     Rate and Rhythm: Normal rate and regular rhythm.  Pulmonary:     Effort: Pulmonary effort is normal.  Abdominal:     Tenderness: There  is no abdominal tenderness. There is no guarding.  Skin:    General: Skin is warm and dry.    Imaging: CT ABDOMEN PELVIS WO CONTRAST  Result Date: 08/19/2021 CLINICAL DATA:  Acute renal failure.  Bilateral hydronephrosis. EXAM: CT ABDOMEN AND PELVIS WITHOUT CONTRAST TECHNIQUE: Multidetector CT imaging of the abdomen and pelvis was performed following the standard protocol without IV contrast. COMPARISON:  12/07/2020 FINDINGS: Lower chest: Trace bilateral pleural effusions. Hepatobiliary: No focal liver abnormality is seen. No gallstones,  gallbladder wall thickening, or biliary dilatation. Pancreas: Unremarkable. No pancreatic ductal dilatation or surrounding inflammatory changes. Spleen: Normal in size without focal abnormality. Adrenals/Urinary Tract: Adrenal glands are unremarkable. Moderate bilateral hydronephrosis and hydroureter. 6 mm nonobstructing calculus of the lower pole of the right kidney. 8 mm nonobstructing calculus at the lower pole of the left kidney. 4 mm calculus within the dependent portion of the bladder. The bladder is collapsed around a Foley catheter balloon which limits evaluation. Stomach/Bowel: No bowel dilatation to indicate ileus or obstruction. Extensive sigmoid and descending colon diverticulosis without evidence of acute diverticulitis. Vascular/Lymphatic: Atherosclerotic calcifications seen throughout the abdominal aorta without aneurysm. No enlarged abdominal pelvic lymph nodes. Reproductive: Calcifications noted within the prostate. Other: No abdominal wall hernia or abnormality. No abdominopelvic ascites. Musculoskeletal: Diffuse osteoblastic metastatic disease again seen. Unchanged moderate to severe compression deformity of the L1 vertebral body. IMPRESSION: Moderate bilateral hydronephrosis and hydroureter. Bilateral nonobstructing renal calculi. Electronically Signed   By: Miachel Roux M.D.   On: 08/19/2021 13:43   DG Cystogram  Result Date: 08/19/2021 CLINICAL DATA:  Surgery, elective. EXAM: CYSTOGRAM TECHNIQUE: A single intraprocedural fluoroscopic image from reported cystoscopy and bladder fulguration is submitted. FLUOROSCOPY TIME:  Fluoroscopy Time:  14 seconds COMPARISON:  Renal ultrasound 08/18/2021. CT abdomen/pelvis 06/06/2021. FINDINGS: A single intraprocedural fluoroscopic image of the pelvis from reported cystoscopy and bladder fulguration is submitted. On the provided image, a cystoscope projects in the region of the pelvis. Correlate with the procedural history. IMPRESSION: Single  intraprocedural fluoroscopic image of the pelvis from reported cystoscopy and bladder fulguration. Please correlate with the procedural history. Electronically Signed   By: Kellie Simmering D.O.   On: 08/19/2021 09:36   US Renal  Result Date: 08/18/2021 CLINICAL DATA:  Renal failure EXAM: RENAL / URINARY TRACT ULTRASOUND COMPLETE COMPARISON:  06/06/2021 FINDINGS: Right Kidney: Renal measurements: 10.2 x 5.3 x 5.3 cm. = volume: 150 mL. Moderate hydronephrosis is noted. This has progressed slightly in the interval from the prior CT. Previously seen right renal calculus is not well appreciated on today's exam. Left Kidney: Renal measurements: 9.8 x 5.5 x 4.5 cm. = volume: 128 mL. Moderate left-sided hydronephrosis is noted as well stable in appearance from prior CT. Previously seen renal calculus is not well appreciated on today's exam. Bladder: Foley catheter is noted within the bladder. Other: None. IMPRESSION: Bilateral hydronephrosis left slightly greater than right similar to that seen on prior CT examination. Previously noted renal calculi bilaterally are not well appreciated on today's exam. Electronically Signed   By: Inez Catalina M.D.   On: 08/18/2021 00:30   DG CHEST PORT 1 VIEW  Result Date: 08/20/2021 CLINICAL DATA:  Hypoxia, respiratory failure, confusion. EXAM: PORTABLE CHEST 1 VIEW COMPARISON:  Chest radiograph 08/19/2021, CT abdomen pelvis 08/19/2021 FINDINGS: Diffuse hazy interstitial thickening and prominence of the perihilar vasculature. Unchanged retrocardiac left lower lobe opacity. No pneumothorax. Stable cardiomediastinal silhouette. Aortic calcifications. Numerous sclerotic osseous lesions, particularly within thoracolumbar spine, better appreciated on yesterday's cross-sectional imaging.  IMPRESSION: Mild pulmonary edema. Retrocardiac left lower lobe opacity may represent combination of pleural fluid and atelectasis, though pneumonia is not excluded. Electronically Signed   By: Ileana Roup  M.D.   On: 08/20/2021 09:32   DG CHEST PORT 1 VIEW  Result Date: 08/19/2021 CLINICAL DATA:  Hypoxia. EXAM: PORTABLE CHEST 1 VIEW COMPARISON:  08/17/2021 FINDINGS: The cardiomediastinal silhouette is unchanged with normal heart size. Aortic atherosclerosis is noted. Lung volumes are low there are increasing patchy airspace opacities throughout the left lung with milder airspace opacities on the right. There is persistent elevation of the left hemidiaphragm. No large pleural effusion or pneumothorax is identified although the patient's chin partially obscures the lung apices. Widespread sclerotic bone metastases are again noted. IMPRESSION: Increasing left greater than right lung airspace opacities concerning for pneumonia. Electronically Signed   By: Logan Bores M.D.   On: 08/19/2021 14:58   DG Chest Port 1 View  Result Date: 08/17/2021 CLINICAL DATA:  Shortness of breath EXAM: PORTABLE CHEST 1 VIEW COMPARISON:  06/06/2021, CT 06/06/2021 FINDINGS: Chronic scarring in the left upper lobe. No acute airspace disease or pleural effusion. Stable cardiomediastinal silhouette with aortic atherosclerosis. Patchy bilateral skeletal sclerosis consistent with osseous metastatic disease. IMPRESSION: 1. Scarring in the left upper lobe.  No acute airspace disease. 2. Diffuse rib sclerosis consistent with skeletal metastatic disease Electronically Signed   By: Donavan Foil M.D.   On: 08/17/2021 23:41   IR NEPHROSTOMY PLACEMENT LEFT  Result Date: 08/20/2021 INDICATION: History of metastatic prostate cancer, now with bilateral obstructive uropathy. Please perform placement of bilateral nephrostomy catheters for urinary diversion purposes. EXAM: ULTRASOUND AND FLUOROSCOPIC GUIDED PLACEMENT OF BILATERAL NEPHROSTOMY TUBES COMPARISON:  CT abdomen pelvis-08/19/2021 MEDICATIONS: Patient is currently admitted to the hospital receiving intravenous antibiotics.; The antibiotic was administered in an appropriate time frame prior  to skin puncture. ANESTHESIA/SEDATION: Moderate (conscious) sedation was employed during this procedure, administered by the interventional radiology RN. A total of Versed 0.5 mg was administered intravenously. Moderate Sedation Time: 25 minutes. The patient's level of consciousness and vital signs were monitored continuously by radiology nursing throughout the procedure under my direct supervision. CONTRAST:  40 mL Omnipaque 350-administered into both renal collecting systems. FLUOROSCOPY TIME:  1 minute, 48 seconds (1.3 mGy) COMPLICATIONS: None immediate. PROCEDURE: The procedure, risks, benefits, and alternatives were explained to the the patient's family, questions were encouraged and answered and informed consent was obtained. A timeout was performed prior to the initiation of the procedure. The operative sites were prepped and draped in the usual sterile fashion and a sterile drape was applied covering the operative field. A sterile gown and sterile gloves were used for the procedure. Local anesthesia was provided with 1% Lidocaine with epinephrine. Beginning with the left kidney, ultrasound was used to localize the left kidney. Under direct ultrasound guidance, a 20 gauge needle was advanced into the renal collecting system. An ultrasound image documentation was performed. Access within the collecting system was confirmed with the efflux of urine followed by limited contrast injection. Under intermittent fluoroscopic guidance, an 0.018 wire was advanced into the collecting system and the tract was dilated with an Accustick stent. Next, over a short Amplatz wire, the track was further dilated ultimately allowing placement of a 10-French percutaneous nephrostomy catheter with end coiled and locked within the renal pelvis. Contrast was injected and several spot fluoroscopic images were obtained in various obliquities. The contralateral procedure was repeated for the right-sided nephrostomy catheter however note,  an upper pole access  was acquired secondary to angulation of the right kidney with the inferior pole being located deep and medial with poor sonographic visualization, accentuated due to patient's medical instability and inability to lie prone on the fluoroscopy table. Ultimately, under ultrasound fluoroscopic guidance, a 10 French nephrostomy catheter was placed via a posterosuperior calyx with end coiled and locked within the right renal pelvis. Contrast was injected several spot fluoroscopic images were obtained in various obliquities Both catheters were secured at the skin entrance site within interrupted sutures and StatLock devices. Both nephrostomies were connected to gravity bags. Dressings were applied. The patient tolerated procedure well without immediate postprocedural complication. FINDINGS: Ultrasound scanning demonstrates a moderate dilated bilateral collecting systems. Under a combination of ultrasound and fluoroscopic guidance, a left-sided posterior inferior calix was targeted allowing placement of a 10-French percutaneous nephrostomy catheter with end coiled and locked within the renal pelvis. Contrast injection confirmed appropriate positioning. Under a combination of ultrasound and fluoroscopic guidance, a right-sided posterosuperior calyx was targeted (an inferior calyx was unable to be accessed secondary to angulation of the right kidney as well as patient's somewhat unstable status and inability to lie on the fluoroscopy table) allowing placement of a 10 French percutaneous nephrostomy catheter with end coiled and locked within the renal pelvis. Contrast injection confirmed appropriate positioning. IMPRESSION: Successful ultrasound and fluoroscopic guided placement of bilateral 10 French percutaneous nephrostomy catheters. Note, as above, the left-sided nephrostomy catheter is via a posteroinferior calyx while the right-sided nephrostomy catheter is via a posterosuperior caliceal access.  Electronically Signed   By: Sandi Mariscal M.D.   On: 08/20/2021 12:43   IR NEPHROSTOMY PLACEMENT RIGHT  Result Date: 08/20/2021 INDICATION: History of metastatic prostate cancer, now with bilateral obstructive uropathy. Please perform placement of bilateral nephrostomy catheters for urinary diversion purposes. EXAM: ULTRASOUND AND FLUOROSCOPIC GUIDED PLACEMENT OF BILATERAL NEPHROSTOMY TUBES COMPARISON:  CT abdomen pelvis-08/19/2021 MEDICATIONS: Patient is currently admitted to the hospital receiving intravenous antibiotics.; The antibiotic was administered in an appropriate time frame prior to skin puncture. ANESTHESIA/SEDATION: Moderate (conscious) sedation was employed during this procedure, administered by the interventional radiology RN. A total of Versed 0.5 mg was administered intravenously. Moderate Sedation Time: 25 minutes. The patient's level of consciousness and vital signs were monitored continuously by radiology nursing throughout the procedure under my direct supervision. CONTRAST:  40 mL Omnipaque 350-administered into both renal collecting systems. FLUOROSCOPY TIME:  1 minute, 48 seconds (1.3 mGy) COMPLICATIONS: None immediate. PROCEDURE: The procedure, risks, benefits, and alternatives were explained to the the patient's family, questions were encouraged and answered and informed consent was obtained. A timeout was performed prior to the initiation of the procedure. The operative sites were prepped and draped in the usual sterile fashion and a sterile drape was applied covering the operative field. A sterile gown and sterile gloves were used for the procedure. Local anesthesia was provided with 1% Lidocaine with epinephrine. Beginning with the left kidney, ultrasound was used to localize the left kidney. Under direct ultrasound guidance, a 20 gauge needle was advanced into the renal collecting system. An ultrasound image documentation was performed. Access within the collecting system was  confirmed with the efflux of urine followed by limited contrast injection. Under intermittent fluoroscopic guidance, an 0.018 wire was advanced into the collecting system and the tract was dilated with an Accustick stent. Next, over a short Amplatz wire, the track was further dilated ultimately allowing placement of a 10-French percutaneous nephrostomy catheter with end coiled and locked within the renal pelvis. Contrast  was injected and several spot fluoroscopic images were obtained in various obliquities. The contralateral procedure was repeated for the right-sided nephrostomy catheter however note, an upper pole access was acquired secondary to angulation of the right kidney with the inferior pole being located deep and medial with poor sonographic visualization, accentuated due to patient's medical instability and inability to lie prone on the fluoroscopy table. Ultimately, under ultrasound fluoroscopic guidance, a 10 French nephrostomy catheter was placed via a posterosuperior calyx with end coiled and locked within the right renal pelvis. Contrast was injected several spot fluoroscopic images were obtained in various obliquities Both catheters were secured at the skin entrance site within interrupted sutures and StatLock devices. Both nephrostomies were connected to gravity bags. Dressings were applied. The patient tolerated procedure well without immediate postprocedural complication. FINDINGS: Ultrasound scanning demonstrates a moderate dilated bilateral collecting systems. Under a combination of ultrasound and fluoroscopic guidance, a left-sided posterior inferior calix was targeted allowing placement of a 10-French percutaneous nephrostomy catheter with end coiled and locked within the renal pelvis. Contrast injection confirmed appropriate positioning. Under a combination of ultrasound and fluoroscopic guidance, a right-sided posterosuperior calyx was targeted (an inferior calyx was unable to be accessed  secondary to angulation of the right kidney as well as patient's somewhat unstable status and inability to lie on the fluoroscopy table) allowing placement of a 10 French percutaneous nephrostomy catheter with end coiled and locked within the renal pelvis. Contrast injection confirmed appropriate positioning. IMPRESSION: Successful ultrasound and fluoroscopic guided placement of bilateral 10 French percutaneous nephrostomy catheters. Note, as above, the left-sided nephrostomy catheter is via a posteroinferior calyx while the right-sided nephrostomy catheter is via a posterosuperior caliceal access. Electronically Signed   By: Sandi Mariscal M.D.   On: 08/20/2021 12:43    Labs:  CBC: Recent Labs    08/17/21 1504 08/18/21 0301 08/19/21 0221 08/19/21 1811 08/20/21 0308 08/20/21 1250  WBC 11.4* 11.7* 9.4  --  17.2*  --   HGB 8.6* 8.5* 7.7* 8.1* 6.6* 7.1*  HCT 27.6* 27.1* 24.0* 26.8* 20.5* 22.5*  PLT 131* 127* 120*  --  129*  --     COAGS: Recent Labs    08/18/21 0301  INR 1.3*    BMP: Recent Labs    08/17/21 1504 08/18/21 0301 08/19/21 0221 08/19/21 2219 08/20/21 0308  NA 140 139 145  --  146*  K 4.3 4.5 4.4  --  4.8  CL 110 110 114*  --  112*  CO2 17* 14* 18*  --  17*  GLUCOSE 148* 127* 140*  --  111*  BUN 79* 79* 71*  --  78*  CALCIUM 9.0 9.2 9.0  --  8.3*  CREATININE 5.80* 5.76* 5.72* 6.62* 6.35*  GFRNONAA 9* 9* 9* 8* 8*    LIVER FUNCTION TESTS: Recent Labs    08/18/21 0301 08/19/21 0221 08/20/21 0308  BILITOT 0.4 0.8 0.9  AST 20 14* 24  ALT 13 13 7   ALKPHOS 398* 357* 294*  PROT 7.5 6.6 6.0*  ALBUMIN 2.7* 2.4* 2.2*    Assessment and Plan: Patient observed to be resting in bed.  He reacts to touch but does not open his eyes or speak.  Due to history of dementia patient is poor historian.   -Right nephrostomy tube insertion site unremarkable with sutures and StatLock in place.  No redness, drainage or other signs of infection noted.  Right JP drain has 300 mL  red-colored urine with 1145 mL documented in epic for  the past 24 hours. Dressing has small amount of dried blood. -Left nephrostomy tube insertion site unremarkable with sutures and StatLock in place.  No redness, drainage or other signs of infection noted.  Left JP drain has approximately 50 mL red-colored urine with 425 mL documented in epic for the past 24 hours.  Dressing has small amount of dried blood.  Patient's vital signs are stable.   He is afebrile, white blood count 17.2. Creatinine has slightly decreased 6.35 from 6.65 yesterday.  BUN 78  GFR is 8. He is in no distress  Document bilateral nephrostomy tube output every shift. Keep dressings clean and dry, change as needed. Call IR for questions or concerns.   Electronically Signed: Tyson Alias, NP 08/20/2021, 1:54 PM   I spent a total of 15 Minutes at the the patient's bedside AND on the patient's hospital floor or unit, greater than 50% of which was counseling/coordinating care for bilateral nephrostomy tube placement.

## 2021-08-20 NOTE — Consult Note (Signed)
NAME:  Anthony Dudley, MRN:  [id number], DOB:  01-15-1933, LOS: 2 ADMISSION DATE:  08/17/2021, CONSULTATION DATE:  08/20/2021 REFERRING MD:  Harrold Donath MD, CHIEF COMPLAINT:   08/20/2021  History of Present Illness:  87 year old with prostate cancer, chronic indwelling Foley, CKD, MDR UTI, dementia, HFpEF, hypertension presenting with urosepsis, AKI on CKD, bilateral hydronephrosis.  Urology was consulted and attempted retrograde ureteral stent placement was unsuccessful and he had bilateral nephrostomy tubes placed by IR last night.  Rapid response called for SVT and he became hypotensive after amiodarone was initiated.  PCCM consulted  Pertinent  Medical History   Metastatic prostate cancer, chronic indwelling foley, CKD stage III, hx MDR UTI, dementia, chronic bedbound status, HFpEF, HTN and multiple other medical problems  Significant Hospital Events: Including procedures, antibiotic start and stop dates in addition to other pertinent events   10/4 Admit 10/5 Attempted bilateral ureteral stent placement was unsuccessful due to extensive local tumor growth likely related to his prostate cancer.  He began to have bleeding from his tumor growth at his bladder trigone.  Started on continuous bladder irrigation 10/6 Bilateral nephrostomy tubes by IR  Interim History / Subjective:    Objective   Blood pressure (!) 100/53, pulse (!) 142, temperature 99.2 F (37.3 C), temperature source Oral, resp. rate (!) 25, weight 81.3 kg, SpO2 95 %. PAP: (88)/(24) 88/24 CVP:  [0 mmHg] 0 mmHg      Intake/Output Summary (Last 24 hours) at 08/20/2021 0416 Last data filed at 08/20/2021 0301 Gross per 24 hour  Intake 873.67 ml  Output 11490 ml  Net -10616.33 ml   Filed Weights   08/19/21 0036 08/19/21 1538  Weight: 80.1 kg 81.3 kg    Examination: Blood pressure 114/67, pulse (!) 142, temperature 99.2 F (37.3 C), temperature source Oral, resp. rate (!) 25, weight 81.3 kg, SpO2 95 %. Gen:       No acute distress HEENT:  EOMI, sclera anicteric Neck:     No masses; no thyromegaly Lungs:    Clear to auscultation bilaterally; normal respiratory effort CV:         Regular rate and rhythm; no murmurs Abd:      Mild abdominal tenderness Ext:    No edema; adequate peripheral perfusion Skin:      Warm and dry; no rash Neuro: Somnolent, arousable.  Alert to questions  Resolved Hospital Problem list     Assessment & Plan:  SVT with hypotension History of HFpEF He had a brief episode of hypotension with amnio bolus which is responding to IV fluid Cardiology has been consulted, echo ordered Resume amiodarone without bolus  Sepsis, present on admission secondary to UTI Bilateral hydronephrosis s/p nephrostomy tube Continue vanco, cefepime.  He has prior history of MDR Proteus sensitive to cefepime Follow culture.  Check lactic acid  Acute hypoxic respiratory failure secondary to HAP Chest x-ray reviewed with bilateral pulm infiltrates Supplemental oxygen, follow chest x-ray Antibiotics as above  AKI on CKD Monitor urine output and creatinine  Blood loss anemia, hemorrhagic shock Hemoglobin down to 6.6 due to hematuria and bleeding from nephrostomy tubes Transfuse 1 unit PRBC  Metastatic prostate cancer with bone mets On hormone therapy as an outpatient  Does not need ICU at present. We will follow  Best Practice (right click and "Reselect all SmartList Selections" daily)   Diet/type: NPO DVT prophylaxis: SCD GI prophylaxis: N/A Lines: N/A Foley:  Yes, and it is still needed Code Status:  full code Last date of  multidisciplinary goals of care discussion []   Labs   CBC: Recent Labs  Lab 08/17/21 1504 08/18/21 0301 08/19/21 0221 08/19/21 1811 08/20/21 0308  WBC 11.4* 11.7* 9.4  --  17.2*  NEUTROABS 7.7  --  8.1*  --  15.8*  HGB 8.6* 8.5* 7.7* 8.1* 6.6*  HCT 27.6* 27.1* 24.0* 26.8* 20.5*  MCV 100.0 100.0 99.2  --  101.5*  PLT 131* 127* 120*  --  129*     Basic Metabolic Panel: Recent Labs  Lab 08/17/21 1504 08/18/21 0301 08/19/21 0221 08/19/21 2219  NA 140 139 145  --   K 4.3 4.5 4.4  --   CL 110 110 114*  --   CO2 17* 14* 18*  --   GLUCOSE 148* 127* 140*  --   BUN 79* 79* 71*  --   CREATININE 5.80* 5.76* 5.72* 6.62*  CALCIUM 9.0 9.2 9.0  --   MG  --   --  1.8  --   PHOS  --   --  4.5  --    GFR: CrCl cannot be calculated (Unknown ideal weight.). Recent Labs  Lab 08/17/21 1504 08/18/21 0301 08/18/21 0629 08/19/21 0221 08/20/21 0308  PROCALCITON  --  39.50  --   --   --   WBC 11.4* 11.7*  --  9.4 17.2*  LATICACIDVEN  --  1.0 1.1  --   --     Liver Function Tests: Recent Labs  Lab 08/18/21 0301 08/19/21 0221  AST 20 14*  ALT 13 13  ALKPHOS 398* 357*  BILITOT 0.4 0.8  PROT 7.5 6.6  ALBUMIN 2.7* 2.4*   No results for input(s): LIPASE, AMYLASE in the last 168 hours. No results for input(s): AMMONIA in the last 168 hours.  ABG No results found for: PHART, PCO2ART, PO2ART, HCO3, TCO2, ACIDBASEDEF, O2SAT   Coagulation Profile: Recent Labs  Lab 08/18/21 0301  INR 1.3*    Cardiac Enzymes: No results for input(s): CKTOTAL, CKMB, CKMBINDEX, TROPONINI in the last 168 hours.  HbA1C: No results found for: HGBA1C  CBG: No results for input(s): GLUCAP in the last 168 hours.  Review of Systems:   REVIEW OF SYSTEMS:   All negative; except for those that are bolded, which indicate positives.  Constitutional: weight loss, weight gain, night sweats, fevers, chills, fatigue, weakness.  HEENT: headaches, sore throat, sneezing, nasal congestion, post nasal drip, difficulty swallowing, tooth/dental problems, visual complaints, visual changes, ear aches. Neuro: difficulty with speech, weakness, numbness, ataxia. CV:  chest pain, orthopnea, PND, swelling in lower extremities, dizziness, palpitations, syncope.  Resp: cough, hemoptysis, dyspnea, wheezing. GI: heartburn, indigestion, abdominal pain, nausea, vomiting,  diarrhea, constipation, change in bowel habits, loss of appetite, hematemesis, melena, hematochezia.  GU: dysuria, change in color of urine, urgency or frequency, flank pain, hematuria. MSK: joint pain or swelling, decreased range of motion. Psych: change in mood or affect, depression, anxiety, suicidal ideations, homicidal ideations. Skin: rash, itching, bruising.   Past Medical History:  He,  has no past medical history on file.   Surgical History:   Past Surgical History:  Procedure Laterality Date   CYSTOSCOPY WITH RETROGRADE PYELOGRAM, URETEROSCOPY AND STENT PLACEMENT Bilateral 08/18/2021   Procedure: 1.  Cystoscopy 2.  Fulguration of bladder;  Surgeon: Raynelle Bring, MD;  Location: [facility];  Service: Urology;  Laterality: Bilateral;     Social History:      Family History:  His family history is not on file.   Allergies Allergies  Allergen  Reactions   Solanum Lycopersicum [Tomato]     Patient reports he is not allergic to tomato and he eats them all the time     Home Medications  Prior to Admission medications   Medication Sig Start Date End Date Taking? Authorizing Provider  Cholecalciferol (VITAMIN D-3) 125 MCG (5000 UT) TABS Take 5,000 Units by mouth daily.   Yes [provider]  ipratropium-albuterol (DUONEB) 0.5-2.5 (3) MG/3ML SOLN Take 3 mLs by nebulization every 4 (four) hours as needed (shortness of breath).   Yes [provider]  metoprolol tartrate (LOPRESSOR) 25 MG tablet Take 12.5 mg by mouth 2 (two) times daily.   Yes [provider]  Multiple Vitamins-Iron (MULTI-VITAMIN/IRON) TABS Take 1 tablet by mouth in the morning and at bedtime.   Yes [provider]  niacinamide 500 MG tablet Take 500 mg by mouth daily.   Yes [provider]  Nutritional Supplements (BOOST BREEZE PO) Take 1 Bottle by mouth 3 (three) times daily.   Yes [provider]  OXYGEN Inhale into the lungs See admin instructions. Put on oxygen  if O2 is less than 90% every shift   Yes [provider]  sodium bicarbonate 650 MG tablet Take 650 mg by mouth daily.   Yes [provider]  tamsulosin (FLOMAX) 0.4 MG CAPS capsule Take 0.4 mg by mouth daily.   Yes [provider]  tolterodine (DETROL) 1 MG tablet Take 1 mg by mouth 2 (two) times daily.   Yes [provider]  vitamin B-12 (CYANOCOBALAMIN) 1000 MCG tablet Take 2,000 mcg by mouth daily.   Yes [provider]     Critical care time: NA   Marshell Garfinkel MD [facility] See Amion for pager  If no response to pager , please call [phone] until 7pm After 7:00 pm call Elink  [phone] 08/20/2021, 4:31 AM

## 2021-08-20 NOTE — Progress Notes (Signed)
   08/20/21 0235  Assess: MEWS Score  BP 100/79  Assess: MEWS Score  MEWS Temp 0  MEWS Systolic 1  MEWS Pulse 2  MEWS RR 1  MEWS LOC 0  MEWS Score 4  MEWS Score Color Red  Assess: if the MEWS score is Yellow or Red  Were vital signs taken at a resting state? No  Focused Assessment No change from prior assessment  Early Detection of Sepsis Score *See Row Information* Medium  MEWS guidelines implemented *See Row Information* No, previously red, continue vital signs every 4 hours  Document  Patient Outcome Stabilized after interventions  Progress note created (see row info) Yes

## 2021-08-20 NOTE — Progress Notes (Signed)
Pharmacy Antibiotic Note  Anthony Dudley is a 87 y.o. male admitted on 08/17/2021 with AKI on CKD III, metabolic acidosis, sepsis, UTI (indwelling catheter), prostate cancer. Pharmacy has been consulted for Vancomycin  D#2 for pneumonia and Cefepime D# 3 for sepsis secondary to UTI; per provider notes, pt has prior cx with MDR Proteus susceptible to cefepime.   AKI,  Scr has trending up.  SCr 6.35 today, stable compared to 6.62 yesterday  Estimated CrCl is 8.4 ml/min.   Nephrology following.  S/p bilateral nephrostomy tube placement per IR done 10/6  Will reduce Cefepime to 1g q24h for CrCl <10 ml/min  for sepsis-UTI. Vancomycin 1500mg  x1 given 10/6 @1900  No standing vancomycin dose due to renal insufficiency Will check VR in AM 10/8 0500 to see where  vanc level is.  MRSA PCR: positive  Plan: Cefepime decreased to 1g IV Q24H Variable vancomycin dosing due to AKI. Will redose based on renal function and random vancomycin level when level <20.  Will check vancomycin random level in AM 10/8. Monitor WBC, temp, clinical course, cultures, renal function    Height: 5\' 9"  (175.3 cm) (unable to stand, approximate measurement lying in bed.) Weight: 83.2 kg (183 lb 6.8 oz) IBW/kg (Calculated) : 70.7  Temp (24hrs), Avg:98.3 F (36.8 C), Min:97.4 F (36.3 C), Max:99.2 F (37.3 C)  Recent Labs  Lab 08/17/21 1504 08/18/21 0301 08/18/21 0629 08/19/21 0221 08/19/21 2219 08/20/21 0308 08/20/21 0425 08/20/21 0635  WBC 11.4* 11.7*  --  9.4  --  17.2*  --   --   CREATININE 5.80* 5.76*  --  5.72* 6.62* 6.35*  --   --   LATICACIDVEN  --  1.0 1.1  --   --   --  1.0 0.9     Estimated Creatinine Clearance: 8.4 mL/min (A) (by C-G formula based on SCr of 6.35 mg/dL (H)).    Allergies  Allergen Reactions   Solanum Lycopersicum [Tomato]     Patient reports he is not allergic to tomato and he eats them all the time    Antimicrobials this admission: Cefepime 10/5 >> Cefazolin X 1 for procedure  10/6 Vancomycin 10/6 >>  Microbiology results: 1 10/5 BCx - no growth to date x2 days 10/5 UC - negative  10/6 MRSA PCR: positive  Thank you for allowing pharmacy to be a part of this patient's care.  Gillermina Hu, PharmD, BCPS, [facility] Clinical Pharmacist 08/20/2021 3:08 PM

## 2021-08-20 NOTE — Progress Notes (Signed)
Pt was started on Amio IV, pt had a bolus and 5 minutes into it BP started dropping in the 60's, HR went down to 140's.  Stopped the Amio, due to the low BP.  Called RR for support.  Once Amio was held, BP started creeping up and went back into the 70-80's, then 90's.  On call MD came up to see pt.  Thanks Arvella Nigh RN.

## 2021-08-20 NOTE — Progress Notes (Addendum)
PROGRESS NOTE    Anthony Dudley  UMP:[id number] DOB: 1933-07-13 DOA: 08/17/2021 PCP: [name], MD   Chief Complaint  Patient presents with   Acute Renal Failure   Abnormal Lab   Brief Narrative:  87 yo with hx of metastatic prostate cancer, chronic indwelling foley, CKD stage III, hx MDR UTI, dementia, chronic bedbound status, HFpEF, HTN and multiple other medical problems presenting with AKI on CKD from the nursing home.    Creatinine reportedly 2.5 on 8/26.   Cr reportedly 6.1 on 10/3.  He was recently transfused with Jaylenne Hamelin HB of 6.6 at [facility] on 08/10/21.    He's been admitted with sepsis 2/2 UTI and AKI on CKD.    Urology consult pending due to imaging concerning for bilateral hydro.  Assessment & Plan:   Principal Problem:   Acute-on-chronic kidney injury ([place]) Active Problems:   Metabolic acidosis   Sepsis ([place])   UTI (urinary tract infection) due to urinary indwelling catheter (HCC)   Prostate cancer (HCC)  Goals of care Discussed with brother 10/5.  Pt full code, but he'd like to know if anything were to happen.  Discussed Mr. Ottavio Norem not Tifany Hirsch great dialysis candidate and his brother seems to understand this.  Will continue medical care, follow urology c/s. Will reach out to brother again today, consider palliative involvement -> he notes not being quite ready yet to change code status.  SVT  hypotension Appreciate cardiology and PCCM assistance Started on amiodarone  Rate improved today, mildly tachy, sinus Appreciate cardiology recs - amiodarone for now, consider IVF Echo pending TSH  Acute Hypoxic Respiratory Failure CXR is c/w pneumonia CXR today with pulm edema, pneumonia not excluded Suspect pulm edema with renal failure/hx HFpEF Started on gentle IVF in setting of above and hypotension - end time set, getting additional volume with blood  Abx broadened to include vanc/cefepime Hopefully will have post obstructive diuresis after perc  tubes placed - follow I/o  Acute Blood Loss Anemia  Acute on chronic anemia 2/2 IVF, blood loss from hematuria 2 unit pRBC for today Follow post transfusion h/h Hb 6.6 on 9/27, s/p 2 units pRBC 09/2019 hb 9.4 Labs c/w AOCD with iron def  Sepsis 2/2 CAUTI  CAP  Leukocytosis Tachypnea, tachycardia, leukocytosis and UA concerning for UTI - meets criteria for sepsis Prior cx with MDR proteus sensitive to cefepime Continue cefepime.  Vanc added with concern for pneumonia above.   Follow blood (Ngx1) and urine cultures (multiple species) Now s/p perc nephrostomy tube placement UA with large LE, many bacteria, 21-50 RBC, >50 WBC Foley exchanged in ED Urology consulted in setting of below  AKI on CKD III  Bilateral Hydronephrosis  Metabolic Acidosis  Hypernatremia Creatinine 2.5 on 8/26 reportedly 6.1 on 10/3 reportedly Creatinine 6.35 today - worsened, follow  CT with moderate bilateral hydro Renal US with bilateral hydro L>R Urology c/s, appreciate recs  S/p bilateral nephrostomy tubes Will follow repeat labs  Hx of Prostate Cancer Diffuse rib sclerosis c/w metastatic disease Urology c/s - degarelix per urology PSA elevated - likely signifies widely metastatic cancer per urology, follow testosterone Will need treatment escalation pending further staging studies  Urethral Erosion  Urinary Retention Foley for now, per urology  Hematuria  Due to procedure, irritation of local tumor in bladder CBI per urology  HFpEF Developing LE edema Fluids, volume as above  Thrombocytopenia follow  DVT prophylaxis: SCD Code Status: full Family Communication: none at bedside - brother over phone 10/7 Disposition:  Status is: Inpatient  Remains inpatient appropriate because:Inpatient level of care appropriate due to severity of illness  Dispo: The patient is from: Home              Anticipated d/c is to: Home              Patient currently is not medically stable to d/c.    Difficult to place patient No  Consultants:  urology  Procedures:  none  Antimicrobials:  Anti-infectives (From admission, onward)    Start     Dose/Rate Route Frequency Ordered Stop   08/19/21 1800  ceFEPIme (MAXIPIME) 2 g in sodium chloride 0.9 % 100 mL IVPB        2 g 200 mL/hr over 30 Minutes Intravenous Every 24 hours 08/19/21 1553     08/19/21 1645  vancomycin (VANCOREADY) IVPB 1500 mg/300 mL        1,500 mg 150 mL/hr over 120 Minutes Intravenous  Once 08/19/21 1551 08/19/21 1911   08/19/21 1551  vancomycin variable dose per unstable renal function (pharmacist dosing)         Does not apply See admin instructions 08/19/21 1551     08/19/21 1545  ceFAZolin (ANCEF) IVPB 2g/100 mL premix        2 g 200 mL/hr over 30 Minutes Intravenous To Radiology 08/19/21 1452 08/19/21 1655   08/19/21 0000  ceFEPIme (MAXIPIME) 1 g in sodium chloride 0.9 % 100 mL IVPB  Status:  Discontinued        1 g 200 mL/hr over 30 Minutes Intravenous Every 24 hours 08/18/21 0254 08/19/21 1553   08/18/21 0115  ceFEPIme (MAXIPIME) 1 g in sodium chloride 0.9 % 100 mL IVPB        1 g 200 mL/hr over 30 Minutes Intravenous  Once 08/18/21 0111 08/18/21 0453          Subjective: Doesn't say anythign again today  Objective: Vitals:   08/20/21 0700 08/20/21 0724 08/20/21 0832 08/20/21 0853  BP:  (!) 104/55 (!) 92/43 (!) 111/59  Pulse:  (!) 106 92 (!) 102  Resp:  20 (!) 24 19  Temp:  98.9 F (37.2 C) 97.7 F (36.5 C) (!) 97.4 F (36.3 C)  TempSrc:  Axillary Axillary Axillary  SpO2:  95% 97% 98%  Weight: 83.2 kg       Intake/Output Summary (Last 24 hours) at 08/20/2021 1011 Last data filed at 08/20/2021 0700 Gross per 24 hour  Intake 318.49 ml  Output 8915 ml  Net -8596.51 ml   Filed Weights   08/19/21 0036 08/19/21 1538 08/20/21 0700  Weight: 80.1 kg 81.3 kg 83.2 kg    Examination:  General: No acute distress, sleepy today Cardiovascular: RRR Lungs: diminished, no appreciable  crackles Abdomen: Soft, nontender, nondistended Neurological: sleepy today, doesn't say much Skin: Warm and dry. No rashes or lesions. Extremities: bilateral lower extremity edema   Data Reviewed: I have personally reviewed following labs and imaging studies  CBC: Recent Labs  Lab 08/17/21 1504 08/18/21 0301 08/19/21 0221 08/19/21 1811 08/20/21 0308  WBC 11.4* 11.7* 9.4  --  17.2*  NEUTROABS 7.7  --  8.1*  --  15.8*  HGB 8.6* 8.5* 7.7* 8.1* 6.6*  HCT 27.6* 27.1* 24.0* 26.8* 20.5*  MCV 100.0 100.0 99.2  --  101.5*  PLT 131* 127* 120*  --  129*    Basic Metabolic Panel: Recent Labs  Lab 08/17/21 1504 08/18/21 0301 08/19/21 0221 08/19/21 2219 08/20/21 0308  NA  140 139 145  --  146*  K 4.3 4.5 4.4  --  4.8  CL 110 110 114*  --  112*  CO2 17* 14* 18*  --  17*  GLUCOSE 148* 127* 140*  --  111*  BUN 79* 79* 71*  --  78*  CREATININE 5.80* 5.76* 5.72* 6.62* 6.35*  CALCIUM 9.0 9.2 9.0  --  8.3*  MG  --   --  1.8  --  1.6*  PHOS  --   --  4.5  --  4.2    GFR: CrCl cannot be calculated (Unknown ideal weight.).  Liver Function Tests: Recent Labs  Lab 08/18/21 0301 08/19/21 0221 08/20/21 0308  AST 20 14* 24  ALT 13 13 7   ALKPHOS 398* 357* 294*  BILITOT 0.4 0.8 0.9  PROT 7.5 6.6 6.0*  ALBUMIN 2.7* 2.4* 2.2*    CBG: No results for input(s): GLUCAP in the last 168 hours.   Recent Results (from the past 240 hour(s))  Urine Culture     Status: Abnormal   Collection Time: 08/17/21 11:16 PM   Specimen: Urine, Catheterized  Result Value Ref Range Status   Specimen Description URINE, CATHETERIZED  Final   Special Requests   Final    NONE Performed at [facility], [address], [place], [place] [address]    Culture MULTIPLE SPECIES PRESENT, SUGGEST RECOLLECTION (Circe Chilton)  Final   Report Status 08/18/2021 FINAL  Final  Resp Panel by RT-PCR (Flu Madissen Wyse&B, Covid) Nasopharyngeal Swab     Status: None   Collection Time: 08/17/21 11:19 PM   Specimen: Nasopharyngeal  Swab; Nasopharyngeal(NP) swabs in vial transport medium  Result Value Ref Range Status   SARS Coronavirus 2 by RT PCR NEGATIVE NEGATIVE Final    Comment: (NOTE) SARS-CoV-2 target nucleic acids are NOT DETECTED.  The SARS-CoV-2 RNA is generally detectable in upper respiratory specimens during the acute phase of infection. The lowest concentration of SARS-CoV-2 viral copies this assay can detect is 138 copies/mL. Doralene Glanz negative result does not preclude SARS-Cov-2 infection and should not be used as the sole basis for treatment or other patient management decisions. Audrey Thull negative result may occur with  improper specimen collection/handling, submission of specimen other than nasopharyngeal swab, presence of viral mutation(s) within the areas targeted by this assay, and inadequate number of viral copies(<138 copies/mL). Ezekiel Menzer negative result must be combined with clinical observations, patient history, and epidemiological information. The expected result is Negative.  Fact Sheet for Patients:  EntrepreneurPulse.com.au  Fact Sheet for Healthcare Providers:  IncredibleEmployment.be  This test is no t yet approved or cleared by the [place] FDA and  has been authorized for detection and/or diagnosis of SARS-CoV-2 by FDA under an Emergency Use Authorization (EUA). This EUA will remain  in effect (meaning this test can be used) for the duration of the COVID-19 declaration under Section 564(b)(1) of the Act, 21 U.S.C.section 360bbb-3(b)(1), unless the authorization is terminated  or revoked sooner.       Influenza Haili Donofrio by PCR NEGATIVE NEGATIVE Final   Influenza B by PCR NEGATIVE NEGATIVE Final    Comment: (NOTE) The Xpert Xpress SARS-CoV-2/FLU/RSV plus assay is intended as an aid in the diagnosis of influenza from Nasopharyngeal swab specimens and should not be used as Kole Hilyard sole basis for treatment. Nasal washings and aspirates are unacceptable for Xpert Xpress  SARS-CoV-2/FLU/RSV testing.  Fact Sheet for Patients: EntrepreneurPulse.com.au  Fact Sheet for Healthcare Providers: IncredibleEmployment.be  This test is not yet  approved or cleared by the [place] and has been authorized for detection and/or diagnosis of SARS-CoV-2 by FDA under an Emergency Use Authorization (EUA). This EUA will remain in effect (meaning this test can be used) for the duration of the COVID-19 declaration under Section 564(b)(1) of the Act, 21 U.S.C. section 360bbb-3(b)(1), unless the authorization is terminated or revoked.  Performed at [facility], [place] [address], [place], [place] [address]   Culture, blood (routine x 2)     Status: None (Preliminary result)   Collection Time: 08/18/21  2:55 AM   Specimen: BLOOD  Result Value Ref Range Status   Specimen Description BLOOD RIGHT ANTECUBITAL  Final   Special Requests   Final    BOTTLES DRAWN AEROBIC AND ANAEROBIC Blood Culture adequate volume   Culture   Final    NO GROWTH 1 DAY Performed at [facility], [place] [address] [place], [place] [address]    Report Status PENDING  Incomplete  Culture, blood (routine x 2)     Status: None (Preliminary result)   Collection Time: 08/18/21  3:01 AM   Specimen: BLOOD  Result Value Ref Range Status   Specimen Description BLOOD BLOOD RIGHT WRIST  Final   Special Requests   Final    BOTTLES DRAWN AEROBIC AND ANAEROBIC Blood Culture adequate volume   Culture   Final    NO GROWTH 1 DAY Performed at [facility], [place] [address] [place], [place] [address]    Report Status PENDING  Incomplete  MRSA Next Gen by PCR, Nasal     Status: Abnormal   Collection Time: 08/19/21  3:50 PM   Specimen: Nasal Mucosa; Nasal Swab  Result Value Ref Range Status   MRSA by PCR Next Gen DETECTED (Itamar Mcgowan) NOT DETECTED Final    Comment: RESULT CALLED TO, READ BACK BY AND VERIFIED WITH: K DUFFY RN 1855 08/19/21 Hettie Roselli BROWNING (NOTE) The  GeneXpert MRSA Assay (FDA approved for NASAL specimens only), is one component of Eilish Mcdaniel comprehensive MRSA colonization surveillance program. It is not intended to diagnose MRSA infection nor to guide or monitor treatment for MRSA infections. Test performance is not FDA approved in patients less than 4 years old. Performed at [facility], [place] [address] [place], [place] [address]          Radiology Studies: CT ABDOMEN PELVIS WO CONTRAST  Result Date: 08/19/2021 CLINICAL DATA:  Acute renal failure.  Bilateral hydronephrosis. EXAM: CT ABDOMEN AND PELVIS WITHOUT CONTRAST TECHNIQUE: Multidetector CT imaging of the abdomen and pelvis was performed following the standard protocol without IV contrast. COMPARISON:  12/07/2020 FINDINGS: Lower chest: Trace bilateral pleural effusions. Hepatobiliary: No focal liver abnormality is seen. No gallstones, gallbladder wall thickening, or biliary dilatation. Pancreas: Unremarkable. No pancreatic ductal dilatation or surrounding inflammatory changes. Spleen: Normal in size without focal abnormality. Adrenals/Urinary Tract: Adrenal glands are unremarkable. Moderate bilateral hydronephrosis and hydroureter. 6 mm nonobstructing calculus of the lower pole of the right kidney. 8 mm nonobstructing calculus at the lower pole of the left kidney. 4 mm calculus within the dependent portion of the bladder. The bladder is collapsed around Elly Haffey Foley catheter balloon which limits evaluation. Stomach/Bowel: No bowel dilatation to indicate ileus or obstruction. Extensive sigmoid and descending colon diverticulosis without evidence of acute diverticulitis. Vascular/Lymphatic: Atherosclerotic calcifications seen throughout the abdominal aorta without aneurysm. No enlarged abdominal pelvic lymph nodes. Reproductive: Calcifications noted within the prostate. Other: No abdominal wall hernia or abnormality. No  abdominopelvic ascites. Musculoskeletal: Diffuse osteoblastic metastatic  disease again seen. Unchanged moderate to severe compression deformity of the L1 vertebral body. IMPRESSION: Moderate bilateral hydronephrosis and hydroureter. Bilateral nonobstructing renal calculi. Electronically Signed   By: Miachel Roux M.D.   On: 08/19/2021 13:43   DG Cystogram  Result Date: 08/19/2021 CLINICAL DATA:  Surgery, elective. EXAM: CYSTOGRAM TECHNIQUE: Tyaisha Cullom single intraprocedural fluoroscopic image from reported cystoscopy and bladder fulguration is submitted. FLUOROSCOPY TIME:  Fluoroscopy Time:  14 seconds COMPARISON:  Renal ultrasound 08/18/2021. CT abdomen/pelvis 06/06/2021. FINDINGS: Jarome Trull single intraprocedural fluoroscopic image of the pelvis from reported cystoscopy and bladder fulguration is submitted. On the provided image, Connell Bognar cystoscope projects in the region of the pelvis. Correlate with the procedural history. IMPRESSION: Single intraprocedural fluoroscopic image of the pelvis from reported cystoscopy and bladder fulguration. Please correlate with the procedural history. Electronically Signed   By: Kellie Simmering D.O.   On: 08/19/2021 09:36   DG CHEST PORT 1 VIEW  Result Date: 08/20/2021 CLINICAL DATA:  Hypoxia, respiratory failure, confusion. EXAM: PORTABLE CHEST 1 VIEW COMPARISON:  Chest radiograph 08/19/2021, CT abdomen pelvis 08/19/2021 FINDINGS: Diffuse hazy interstitial thickening and prominence of the perihilar vasculature. Unchanged retrocardiac left lower lobe opacity. No pneumothorax. Stable cardiomediastinal silhouette. Aortic calcifications. Numerous sclerotic osseous lesions, particularly within thoracolumbar spine, better appreciated on yesterday's cross-sectional imaging. IMPRESSION: Mild pulmonary edema. Retrocardiac left lower lobe opacity may represent combination of pleural fluid and atelectasis, though pneumonia is not excluded. Electronically Signed   By: Ileana Roup M.D.   On: 08/20/2021 09:32   DG CHEST PORT 1 VIEW  Result Date: 08/19/2021 CLINICAL DATA:   Hypoxia. EXAM: PORTABLE CHEST 1 VIEW COMPARISON:  08/17/2021 FINDINGS: The cardiomediastinal silhouette is unchanged with normal heart size. Aortic atherosclerosis is noted. Lung volumes are low there are increasing patchy airspace opacities throughout the left lung with milder airspace opacities on the right. There is persistent elevation of the left hemidiaphragm. No large pleural effusion or pneumothorax is identified although the patient's chin partially obscures the lung apices. Widespread sclerotic bone metastases are again noted. IMPRESSION: Increasing left greater than right lung airspace opacities concerning for pneumonia. Electronically Signed   By: Logan Bores M.D.   On: 08/19/2021 14:58        Scheduled Meds:  sodium chloride   Intravenous Once   sodium chloride   Intravenous Once   Chlorhexidine Gluconate Cloth  6 each Topical Daily   Chlorhexidine Gluconate Cloth  6 each Topical Q0600   mupirocin ointment  1 application Nasal BID   oxybutynin  5 mg Oral QHS   tamsulosin  0.4 mg Oral Daily   vancomycin variable dose per unstable renal function (pharmacist dosing)   Does not apply See admin instructions   Continuous Infusions:  amiodarone     ceFEPime (MAXIPIME) IV Stopped (08/19/21 1908)   lactated ringers     lactated ringers     sodium chloride irrigation       LOS: 2 days    Time spent: over 30 min 40 min critical care with AKI on CKD, tachypnea, tachycardia - c/w sepsis 2/2 UTI    Fayrene Helper, MD [facility]   To contact the attending provider between 7A-7P or the covering provider during after hours 7P-7A, please log into the web site [email or web address] and access using universal [place] password for that web site. If you do not have the password, please call the hospital operator.  08/20/2021, 10:11 AM

## 2021-08-20 NOTE — Progress Notes (Signed)
Speech Language Pathology Treatment: Dysphagia  Patient Details Name: Anthony Dudley MRN: [id number] DOB: May 12, 1933 Today's Date: 08/20/2021 Time: 1340-1405 SLP Time Calculation (min) (ACUTE ONLY): 25 min  Assessment / Plan / Recommendation Clinical Impression  Pt demonstrates intermittent lethargy, but will wake up with stimulation. When awake, he cries out for water, which he drinks quite impulsively with decreased awareness - eyes closed needs cues to accept straw. He had some hard coughing due to rapid intake, but with honey thick liquids pt was able to sips from a straw without coughing or signs of aspiration. Given waxing and waning mentation, recommend puree and honey thick liquids if he is ready to resume diet (discussed with RN and tried to message MD without response).   HPI HPI: 87yo male admitted from nursing home 08/17/21 with worsening kidney function. PMH: prostate cancer, CKD3, chronic indwelling Foley catheter, MDR UTI, dementia, bedbound at baseline, dCHF, HTN, HLD, Cvid (04/2019). CXR = no acute airspace disease      SLP Plan  Continue with current plan of care      Recommendations for follow up therapy are one component of a multi-disciplinary discharge planning process, led by the attending physician.  Recommendations may be updated based on patient status, additional functional criteria and insurance authorization.    Recommendations  Diet recommendations: Honey-thick liquid;Dysphagia 1 (puree) Liquids provided via: Cup;Straw Medication Administration: Crushed with puree Supervision: Full supervision/cueing for compensatory strategies;Trained caregiver to feed patient Compensations: Slow rate;Small sips/bites Postural Changes and/or Swallow Maneuvers: Seated upright 90 degrees                Follow up Recommendations: 24 hour supervision/assistance;Skilled Nursing facility Plan: Continue with current plan of care       GO                Nahla Lukin,  Katherene Ponto  08/20/2021, 2:21 PM

## 2021-08-20 NOTE — Progress Notes (Signed)
NAME:  Anthony Dudley, MRN:  [id number], DOB:  02-08-1933, LOS: 2 ADMISSION DATE:  08/17/2021, CONSULTATION DATE:  08/20/2021 REFERRING MD:  Harrold Donath MD, CHIEF COMPLAINT:   08/20/2021  History of Present Illness:  87 year old with prostate cancer, chronic indwelling Foley, CKD, MDR UTI, dementia, HFpEF, hypertension presenting with urosepsis, AKI on CKD, bilateral hydronephrosis.  Urology was consulted and attempted retrograde ureteral stent placement was unsuccessful and he had bilateral nephrostomy tubes placed by IR last night.  Rapid response called for SVT and he became hypotensive after amiodarone was initiated.  PCCM consulted  Pertinent  Medical History   Metastatic prostate cancer, chronic indwelling foley, CKD stage III, hx MDR UTI, dementia, chronic bedbound status, HFpEF, HTN and multiple other medical problems  Significant Hospital Events: Including procedures, antibiotic start and stop dates in addition to other pertinent events   10/4 Admit 10/5 Attempted bilateral ureteral stent placement was unsuccessful due to extensive local tumor growth likely related to his prostate cancer.  He began to have bleeding from his tumor growth at his bladder trigone.  Started on continuous bladder irrigation 10/6 Bilateral nephrostomy tubes by IR  Interim History / Subjective:  Being transfused for hemoglobin less than 7.  Mental status is somewhat declined.  Hemodynamically stable.  Receiving fluid boluses and Lasix.  Objective   Blood pressure (!) 111/59, pulse (!) 102, temperature (!) 97.4 F (36.3 C), temperature source Axillary, resp. rate 19, weight 83.2 kg, SpO2 98 %. PAP: (88)/(24) 88/24 CVP:  [0 mmHg] 0 mmHg      Intake/Output Summary (Last 24 hours) at 08/20/2021 0929 Last data filed at 08/20/2021 0700 Gross per 24 hour  Intake 913.67 ml  Output 8915 ml  Net -8001.33 ml   Filed Weights   08/19/21 0036 08/19/21 1538 08/20/21 0700  Weight: 80.1 kg 81.3 kg 83.2 kg     Examination: Blood pressure 114/67, pulse (!) 142, temperature 99.2 F (37.3 C), temperature source Oral, resp. rate (!) 25, weight 81.3 kg, SpO2 95 %. Gen:      No acute distress HEENT:  EOMI, sclera anicteric Neck:     No masses; no thyromegaly Lungs:    Clear to auscultation bilaterally; normal respiratory effort CV:         Regular rate and rhythm; no murmurs Abd:      Mild abdominal tenderness Ext:    No edema; adequate peripheral perfusion Skin:      Warm and dry; no rash Neuro: Somnolent, arousable.  Alert to questions  Resolved Hospital Problem list     Assessment & Plan:  SVT with hypotension History of HFpEF Appreciate cardiology's recommendation  Sepsis, present on admission secondary to UTI elevated procalcitonin elevated white count  Bilateral nephrostomy tubes in place chronic indwelling Foley Hemodynamically stable heart rate controlled blood pressure systolic greater than 993 Continue antimicrobial therapy      Acute hypoxic respiratory failure secondary to HAP Chest x-ray reviewed with bilateral pulm infiltrates  Oxygen and antibiotics    AKI on CKD Lab Results  Component Value Date   CREATININE 6.35 (H) 08/20/2021   CREATININE 6.62 (H) 08/19/2021   CREATININE 5.72 (H) 08/19/2021    Monitor urine output creatinine Bilateral nephrostomy tubes in place Per urology  Blood loss anemia, hemorrhagic shock  Recent Labs    08/19/21 1811 08/20/21 0308  HGB 8.1* 6.6*    Transfuse per protocol   Metastatic prostate cancer with bone mets Continue outpatient hormone therapy   Consider ongoing end-of-life discussions  Best Practice (right click and "Reselect all SmartList Selections" daily)   Diet/type: NPO DVT prophylaxis: SCD GI prophylaxis: N/A Lines: N/A Foley:  N/A Code Status:  full code Last date of multidisciplinary goals of care discussion [TBD]  Labs   CBC: Recent Labs  Lab 08/17/21 1504 08/18/21 0301 08/19/21 0221  08/19/21 1811 08/20/21 0308  WBC 11.4* 11.7* 9.4  --  17.2*  NEUTROABS 7.7  --  8.1*  --  15.8*  HGB 8.6* 8.5* 7.7* 8.1* 6.6*  HCT 27.6* 27.1* 24.0* 26.8* 20.5*  MCV 100.0 100.0 99.2  --  101.5*  PLT 131* 127* 120*  --  129*    Basic Metabolic Panel: Recent Labs  Lab 08/17/21 1504 08/18/21 0301 08/19/21 0221 08/19/21 2219 08/20/21 0308  NA 140 139 145  --  146*  K 4.3 4.5 4.4  --  4.8  CL 110 110 114*  --  112*  CO2 17* 14* 18*  --  17*  GLUCOSE 148* 127* 140*  --  111*  BUN 79* 79* 71*  --  78*  CREATININE 5.80* 5.76* 5.72* 6.62* 6.35*  CALCIUM 9.0 9.2 9.0  --  8.3*  MG  --   --  1.8  --  1.6*  PHOS  --   --  4.5  --  4.2   GFR: CrCl cannot be calculated (Unknown ideal weight.). Recent Labs  Lab 08/17/21 1504 08/18/21 0301 08/18/21 0629 08/19/21 0221 08/20/21 0308 08/20/21 0425 08/20/21 0635  PROCALCITON  --  39.50  --   --   --   --   --   WBC 11.4* 11.7*  --  9.4 17.2*  --   --   LATICACIDVEN  --  1.0 1.1  --   --  1.0 0.9    Liver Function Tests: Recent Labs  Lab 08/18/21 0301 08/19/21 0221 08/20/21 0308  AST 20 14* 24  ALT 13 13 7   ALKPHOS 398* 357* 294*  BILITOT 0.4 0.8 0.9  PROT 7.5 6.6 6.0*  ALBUMIN 2.7* 2.4* 2.2*   No results for input(s): LIPASE, AMYLASE in the last 168 hours. No results for input(s): AMMONIA in the last 168 hours.  ABG No results found for: PHART, PCO2ART, PO2ART, HCO3, TCO2, ACIDBASEDEF, O2SAT   Coagulation Profile: Recent Labs  Lab 08/18/21 0301  INR 1.3*    Cardiac Enzymes: No results for input(s): CKTOTAL, CKMB, CKMBINDEX, TROPONINI in the last 168 hours.  HbA1C: No results found for: HGBA1C  CBG: No results for input(s): GLUCAP in the last 168 hours.    Critical care time: NA   Richardson Landry Jaishawn Witzke ACNP Acute Care Nurse Practitioner [place] Please consult Amion 08/20/2021, 9:29 AM

## 2021-08-20 NOTE — Consult Note (Signed)
CARDIOLOGY CONSULT NOTE  Patient ID: Authur Cubit MRN: [id number] DOB/AGE: 85/24/1934 85 y.o.  Admit date: 08/17/2021 Referring Physician: [facility] physician Reason for Consultation:  Tachycardia  HPI:   87 year old Caucasian male with hypertension, hyperlipidemia, dementia, nursing home resident with chronic regarding Foley catheter, mostly bedbound, history of diastolic heart failure, history of prostate cancer, baseline CKD stage III, now admitted from nursing home with "worsening kidney function".  Cardiology consulted for tachycardia.  History is mostly obtained from chart and talking to the nursing staff.  Patient has dementia and is oriented to self only at baseline.  He is unable to give me any history and only responds with incomprehensible speech.  It appears that he has been admitted for over 24 hours now with working diagnosis being UTI with sepsis.  He also has anemia with further drop in hemoglobin today.  Cardiology consulted for tachycardia and associated hypotension.  He is currently on IV amiodarone with breakthrough episodes of slower heart rate, still a pulse of 100 bpm.  He is net -12 L for the hospital stay.  History reviewed. No pertinent past medical history.   Past Surgical History:  Procedure Laterality Date   CYSTOSCOPY WITH RETROGRADE PYELOGRAM, URETEROSCOPY AND STENT PLACEMENT Bilateral 08/18/2021   Procedure: 1.  Cystoscopy 2.  Fulguration of bladder;  Surgeon: Raynelle Bring, MD;  Location: [place];  Service: Urology;  Laterality: Bilateral;     History reviewed. No pertinent family history.   Social History: Social History   Socioeconomic History   Marital status: Single    Spouse name: Not on file   Number of children: Not on file   Years of education: Not on file   Highest education level: Not on file  Occupational History   Not on file  Tobacco Use   Smoking status: Not on file   Smokeless tobacco: Not on file  Substance and Sexual Activity    Alcohol use: Not on file   Drug use: Not on file   Sexual activity: Not on file  Other Topics Concern   Not on file  Social History Narrative   Not on file   Social Determinants of Health   Financial Resource Strain: Not on file  Food Insecurity: Not on file  Transportation Needs: Not on file  Physical Activity: Not on file  Stress: Not on file  Social Connections: Not on file  Intimate Partner Violence: Not on file     Medications Prior to Admission  Medication Sig Dispense Refill Last Dose   Cholecalciferol (VITAMIN D-3) 125 MCG (5000 UT) TABS Take 5,000 Units by mouth daily.   08/17/2021   ipratropium-albuterol (DUONEB) 0.5-2.5 (3) MG/3ML SOLN Take 3 mLs by nebulization every 4 (four) hours as needed (shortness of breath).   unk   metoprolol tartrate (LOPRESSOR) 25 MG tablet Take 12.5 mg by mouth 2 (two) times daily.   08/17/2021 at 0800   Multiple Vitamins-Iron (MULTI-VITAMIN/IRON) TABS Take 1 tablet by mouth in the morning and at bedtime.   08/17/2021   niacinamide 500 MG tablet Take 500 mg by mouth daily.   08/17/2021   Nutritional Supplements (BOOST BREEZE PO) Take 1 Bottle by mouth 3 (three) times daily.   08/17/2021   OXYGEN Inhale into the lungs See admin instructions. Put on oxygen if O2 is less than 90% every shift   08/17/2021   sodium bicarbonate 650 MG tablet Take 650 mg by mouth daily.   08/17/2021   tamsulosin (FLOMAX) 0.4 MG CAPS capsule Take  0.4 mg by mouth daily.   08/17/2021   tolterodine (DETROL) 1 MG tablet Take 1 mg by mouth 2 (two) times daily.   08/17/2021   vitamin B-12 (CYANOCOBALAMIN) 1000 MCG tablet Take 2,000 mcg by mouth daily.   08/17/2021    Review of Systems  Unable to perform ROS: Dementia     Physical Exam: Physical Exam Vitals and nursing note reviewed.  Constitutional:      General: He is not in acute distress.    Appearance: He is well-developed. He is ill-appearing.  HENT:     Head: Normocephalic and atraumatic.     Mouth/Throat:      Mouth: Mucous membranes are dry.  Eyes:     Conjunctiva/sclera: Conjunctivae normal.     Pupils: Pupils are equal, round, and reactive to light.  Neck:     Vascular: No JVD.  Cardiovascular:     Rate and Rhythm: Regular rhythm. Tachycardia present.     Pulses: Normal pulses and intact distal pulses.     Heart sounds: No murmur heard. Pulmonary:     Effort: Pulmonary effort is normal.     Breath sounds: Normal breath sounds. No wheezing or rales.  Abdominal:     General: Bowel sounds are normal.     Palpations: Abdomen is soft.     Tenderness: There is no rebound.  Musculoskeletal:        General: No tenderness. Normal range of motion.     Right lower leg: No edema.     Left lower leg: No edema.  Lymphadenopathy:     Cervical: No cervical adenopathy.  Skin:    General: Skin is warm and dry.  Neurological:     Mental Status: He is alert.     Cranial Nerves: No cranial nerve deficit.     Comments: Responds to verbal stimuli but mostly with incomprehensible speech     Labs:   Lab Results  Component Value Date   WBC 17.2 (H) 08/20/2021   HGB 6.6 (LL) 08/20/2021   HCT 20.5 (L) 08/20/2021   MCV 101.5 (H) 08/20/2021   PLT 129 (L) 08/20/2021    Recent Labs  Lab 08/20/21 0308  NA 146*  K 4.8  CL 112*  CO2 17*  BUN 78*  CREATININE 6.35*  CALCIUM 8.3*  PROT 6.0*  BILITOT 0.9  ALKPHOS 294*  ALT 7  AST 24  GLUCOSE 111*    Lipid Panel  No results found for: CHOL, TRIG, HDL, CHOLHDL, VLDL, LDLCALC  BNP (last 3 results) No results for input(s): BNP in the last 8760 hours.  HEMOGLOBIN A1C No results found for: HGBA1C, MPG  Cardiac Panel (last 3 results) No results for input(s): CKTOTAL, CKMB, RELINDX in the last 8760 hours.  Invalid input(s): TROPONINHS  No results found for: CKTOTAL, CKMB, CKMBINDEX   TSH No results for input(s): TSH in the last 8760 hours.    Radiology: CT ABDOMEN PELVIS WO CONTRAST  Result Date: 08/19/2021 CLINICAL DATA:  Acute  renal failure.  Bilateral hydronephrosis. EXAM: CT ABDOMEN AND PELVIS WITHOUT CONTRAST TECHNIQUE: Multidetector CT imaging of the abdomen and pelvis was performed following the standard protocol without IV contrast. COMPARISON:  12/07/2020 FINDINGS: Lower chest: Trace bilateral pleural effusions. Hepatobiliary: No focal liver abnormality is seen. No gallstones, gallbladder wall thickening, or biliary dilatation. Pancreas: Unremarkable. No pancreatic ductal dilatation or surrounding inflammatory changes. Spleen: Normal in size without focal abnormality. Adrenals/Urinary Tract: Adrenal glands are unremarkable. Moderate bilateral hydronephrosis and hydroureter. 6 mm  nonobstructing calculus of the lower pole of the right kidney. 8 mm nonobstructing calculus at the lower pole of the left kidney. 4 mm calculus within the dependent portion of the bladder. The bladder is collapsed around a Foley catheter balloon which limits evaluation. Stomach/Bowel: No bowel dilatation to indicate ileus or obstruction. Extensive sigmoid and descending colon diverticulosis without evidence of acute diverticulitis. Vascular/Lymphatic: Atherosclerotic calcifications seen throughout the abdominal aorta without aneurysm. No enlarged abdominal pelvic lymph nodes. Reproductive: Calcifications noted within the prostate. Other: No abdominal wall hernia or abnormality. No abdominopelvic ascites. Musculoskeletal: Diffuse osteoblastic metastatic disease again seen. Unchanged moderate to severe compression deformity of the L1 vertebral body. IMPRESSION: Moderate bilateral hydronephrosis and hydroureter. Bilateral nonobstructing renal calculi. Electronically Signed   By: Miachel Roux M.D.   On: 08/19/2021 13:43   DG Cystogram  Result Date: 08/19/2021 CLINICAL DATA:  Surgery, elective. EXAM: CYSTOGRAM TECHNIQUE: A single intraprocedural fluoroscopic image from reported cystoscopy and bladder fulguration is submitted. FLUOROSCOPY TIME:  Fluoroscopy  Time:  14 seconds COMPARISON:  Renal ultrasound 08/18/2021. CT abdomen/pelvis 06/06/2021. FINDINGS: A single intraprocedural fluoroscopic image of the pelvis from reported cystoscopy and bladder fulguration is submitted. On the provided image, a cystoscope projects in the region of the pelvis. Correlate with the procedural history. IMPRESSION: Single intraprocedural fluoroscopic image of the pelvis from reported cystoscopy and bladder fulguration. Please correlate with the procedural history. Electronically Signed   By: Kellie Simmering D.O.   On: 08/19/2021 09:36   DG CHEST PORT 1 VIEW  Result Date: 08/19/2021 CLINICAL DATA:  Hypoxia. EXAM: PORTABLE CHEST 1 VIEW COMPARISON:  08/17/2021 FINDINGS: The cardiomediastinal silhouette is unchanged with normal heart size. Aortic atherosclerosis is noted. Lung volumes are low there are increasing patchy airspace opacities throughout the left lung with milder airspace opacities on the right. There is persistent elevation of the left hemidiaphragm. No large pleural effusion or pneumothorax is identified although the patient's chin partially obscures the lung apices. Widespread sclerotic bone metastases are again noted. IMPRESSION: Increasing left greater than right lung airspace opacities concerning for pneumonia. Electronically Signed   By: Logan Bores M.D.   On: 08/19/2021 14:58    Scheduled Meds:  sodium chloride   Intravenous Once   sodium chloride   Intravenous Once   Chlorhexidine Gluconate Cloth  6 each Topical Daily   Chlorhexidine Gluconate Cloth  6 each Topical Q0600   mupirocin ointment  1 application Nasal BID   oxybutynin  5 mg Oral QHS   tamsulosin  0.4 mg Oral Daily   vancomycin variable dose per unstable renal function (pharmacist dosing)   Does not apply See admin instructions   Continuous Infusions:  amiodarone 60 mg/hr (08/20/21 0754)   Followed by   amiodarone     ceFEPime (MAXIPIME) IV Stopped (08/19/21 1908)   lactated ringers      lactated ringers     sodium chloride irrigation     PRN Meds:.acetaminophen **OR** acetaminophen, lidocaine (PF), metoprolol tartrate, midazolam  CARDIAC STUDIES:  Telemetry: Persistent SVT with brief intermittent period of sinus tachycardia around 110 bpm  EKG 08/20/2021: Sinus tachycardia 168 bpm  Echocardiogram: Not available   Assessment & Recommendations:  87 year old Caucasian male with hypertension, hyperlipidemia, dementia, nursing home resident with chronic regarding Foley catheter, mostly bedbound, history of diastolic heart failure, history of prostate cancer, baseline CKD stage III, now admitted from nursing home with "worsening kidney function".  Cardiology consulted for tachycardia.  Tachycardia: Combination of sinus tachycardia 110 bpm, along with SVT  around 130-140 bpm. Patient appears dry on exam.  Ongoing UTI with sepsis as well as anemia are all contributing to his tachycardia episodes. Continue management of primary etiology as you are doing. Consider IV hydration, as patient appears dehydrated. Okay to continue IV amiodarone for now.   If and when he stabilizes from his ongoing substance, we will readdress episodes of SVT. Echocardiogram may not be very useful when his resting heart rate is at 130 bpm.  This can be performed but rate is better controlled around 100 bpm.  Sepsis: Management as per primary team  AKI: Cr >6. Management as per primary team  Cardiology will follow the patient peripherally.  Please call us with any questions. Dr. Terri Skains will be available for any questions over the weekend.     Nigel Mormon, MD Pager: [phone] Office: [phone]

## 2021-08-20 NOTE — Significant Event (Signed)
Rapid Response Event Note   Reason for Call :  hypotension  Initial Focused Assessment:  On my arrival patient is laying in bed with his eyes closed. He responds to voice and is alert and oriented to self. Skin is warm to touch. Lung sounds were clear but diminished throughout.   VS: T 98.5 Oral, BP 65/36(45), HR 143, RR 24, Sats 90% on 4LNC Hgb came back 6.6. Awaiting lactic result.   Interventions:  NS bolus Place additional PIV Send lactic and type and screen VS recovered to HR 114, BP 106/63  Plan of Care:  Consult CCM Finish fluid bolus Restart amio Give RBC Flush nephrostomy tubes more frequently Notify RRT if further assistance is needed   MD Notified: Dr. Kipp Brood Call Time: 0315 Arrival Time: 0320 End Time: [id number]  Monna Fam, RN

## 2021-08-21 ENCOUNTER — Inpatient Hospital Stay (HOSPITAL_COMMUNITY): Payer: Medicare Other

## 2021-08-21 DIAGNOSIS — I9589 Other hypotension: Secondary | ICD-10-CM

## 2021-08-21 DIAGNOSIS — N183 Chronic kidney disease, stage 3 unspecified: Secondary | ICD-10-CM | POA: Diagnosis not present

## 2021-08-21 DIAGNOSIS — E872 Acidosis, unspecified: Secondary | ICD-10-CM | POA: Diagnosis not present

## 2021-08-21 DIAGNOSIS — I472 Ventricular tachycardia, unspecified: Secondary | ICD-10-CM

## 2021-08-21 DIAGNOSIS — C61 Malignant neoplasm of prostate: Secondary | ICD-10-CM

## 2021-08-21 DIAGNOSIS — N189 Chronic kidney disease, unspecified: Secondary | ICD-10-CM | POA: Diagnosis not present

## 2021-08-21 DIAGNOSIS — N179 Acute kidney failure, unspecified: Secondary | ICD-10-CM | POA: Diagnosis not present

## 2021-08-21 LAB — BPAM RBC
Blood Product Expiration Date: 202210132359
Blood Product Expiration Date: 202211022359
ISSUE DATE / TIME: 202210070820
ISSUE DATE / TIME: 202210071532
Unit Type and Rh: 5100
Unit Type and Rh: 5100

## 2021-08-21 LAB — TYPE AND SCREEN
ABO/RH(D): O POS
Antibody Screen: NEGATIVE
Unit division: 0
Unit division: 0

## 2021-08-21 LAB — VANCOMYCIN, RANDOM: Vancomycin Rm: 19

## 2021-08-21 LAB — BASIC METABOLIC PANEL
Anion gap: 16 — ABNORMAL HIGH (ref 5–15)
Anion gap: 17 — ABNORMAL HIGH (ref 5–15)
BUN: 73 mg/dL — ABNORMAL HIGH (ref 8–23)
BUN: 69 mg/dL — ABNORMAL HIGH (ref 8–23)
CO2: 17 mmol/L — ABNORMAL LOW (ref 22–32)
CO2: 17 mmol/L — ABNORMAL LOW (ref 22–32)
Calcium: 8.3 mg/dL — ABNORMAL LOW (ref 8.9–10.3)
Calcium: 8.4 mg/dL — ABNORMAL LOW (ref 8.9–10.3)
Chloride: 117 mmol/L — ABNORMAL HIGH (ref 98–111)
Chloride: 115 mmol/L — ABNORMAL HIGH (ref 98–111)
Creatinine, Ser: 5.75 mg/dL — ABNORMAL HIGH (ref 0.61–1.24)
Creatinine, Ser: 5.46 mg/dL — ABNORMAL HIGH (ref 0.61–1.24)
GFR, Estimated: 9 mL/min — ABNORMAL LOW (ref >60)
GFR, Estimated: 10 mL/min — ABNORMAL LOW (ref >60)
Glucose, Bld: 106 mg/dL — ABNORMAL HIGH (ref 70–99)
Glucose, Bld: 177 mg/dL — ABNORMAL HIGH (ref 70–99)
Potassium: 4 mmol/L (ref 3.5–5.1)
Potassium: 3.2 mmol/L — ABNORMAL LOW (ref 3.5–5.1)
Sodium: 150 mmol/L — ABNORMAL HIGH (ref 135–145)
Sodium: 149 mmol/L — ABNORMAL HIGH (ref 135–145)

## 2021-08-21 LAB — CBC WITH DIFFERENTIAL/PLATELET
Abs Immature Granulocytes: 0.15 10*3/uL — ABNORMAL HIGH (ref 0.00–0.07)
Basophils Absolute: 0 10*3/uL (ref 0.0–0.1)
Basophils Relative: 0 %
Eosinophils Absolute: 0.2 10*3/uL (ref 0.0–0.5)
Eosinophils Relative: 1 %
HCT: 25.6 % — ABNORMAL LOW (ref 39.0–52.0)
Hemoglobin: 8.4 g/dL — ABNORMAL LOW (ref 13.0–17.0)
Immature Granulocytes: 1 %
Lymphocytes Relative: 12 %
Lymphs Abs: 2 10*3/uL (ref 0.7–4.0)
MCH: 30.8 pg (ref 26.0–34.0)
MCHC: 32.8 g/dL (ref 30.0–36.0)
MCV: 93.8 fL (ref 80.0–100.0)
Monocytes Absolute: 0.5 10*3/uL (ref 0.1–1.0)
Monocytes Relative: 3 %
Neutro Abs: 13.9 10*3/uL — ABNORMAL HIGH (ref 1.7–7.7)
Neutrophils Relative %: 83 %
Platelets: 93 10*3/uL — ABNORMAL LOW (ref 150–400)
RBC: 2.73 MIL/uL — ABNORMAL LOW (ref 4.22–5.81)
RDW: 18.9 % — ABNORMAL HIGH (ref 11.5–15.5)
WBC: 16.7 10*3/uL — ABNORMAL HIGH (ref 4.0–10.5)
nRBC: 0.1 % (ref 0.0–0.2)

## 2021-08-21 LAB — COMPREHENSIVE METABOLIC PANEL
ALT: 7 U/L (ref 0–44)
AST: 17 U/L (ref 15–41)
Albumin: 2.2 g/dL — ABNORMAL LOW (ref 3.5–5.0)
Alkaline Phosphatase: 269 U/L — ABNORMAL HIGH (ref 38–126)
Anion gap: 16 — ABNORMAL HIGH (ref 5–15)
BUN: 73 mg/dL — ABNORMAL HIGH (ref 8–23)
CO2: 19 mmol/L — ABNORMAL LOW (ref 22–32)
Calcium: 8.4 mg/dL — ABNORMAL LOW (ref 8.9–10.3)
Chloride: 113 mmol/L — ABNORMAL HIGH (ref 98–111)
Creatinine, Ser: 5.98 mg/dL — ABNORMAL HIGH (ref 0.61–1.24)
GFR, Estimated: 9 mL/min — ABNORMAL LOW (ref >60)
Glucose, Bld: 99 mg/dL (ref 70–99)
Potassium: 3.7 mmol/L (ref 3.5–5.1)
Sodium: 148 mmol/L — ABNORMAL HIGH (ref 135–145)
Total Bilirubin: 0.9 mg/dL (ref 0.3–1.2)
Total Protein: 6 g/dL — ABNORMAL LOW (ref 6.5–8.1)

## 2021-08-21 LAB — ECHOCARDIOGRAM COMPLETE
Height: 69 in
S' Lateral: 2.6 cm
Weight: 2892.44 oz

## 2021-08-21 LAB — HEMOGLOBIN AND HEMATOCRIT, BLOOD
HCT: 25.2 % — ABNORMAL LOW (ref 39.0–52.0)
Hemoglobin: 8.2 g/dL — ABNORMAL LOW (ref 13.0–17.0)

## 2021-08-21 LAB — MAGNESIUM
Magnesium: 1.7 mg/dL (ref 1.7–2.4)
Magnesium: 1.8 mg/dL (ref 1.7–2.4)

## 2021-08-21 LAB — PHOSPHORUS: Phosphorus: 4.3 mg/dL (ref 2.5–4.6)

## 2021-08-21 MED ORDER — DEXTROSE 5 % IV SOLN: INTRAVENOUS | Status: AC

## 2021-08-21 MED ORDER — POTASSIUM CHLORIDE CRYS ER 20 MEQ PO TBCR
40.0000 meq | EXTENDED_RELEASE_TABLET | Freq: Once | ORAL | Status: AC
  Administered 2021-08-21: 40 meq via ORAL
  Filled 2021-08-21: qty 2

## 2021-08-21 MED ORDER — VANCOMYCIN HCL 1250 MG/250ML IV SOLN
1250.0000 mg | Freq: Once | INTRAVENOUS | Status: AC
  Administered 2021-08-21: 1250 mg via INTRAVENOUS
  Filled 2021-08-21 (×2): qty 250

## 2021-08-21 MED ORDER — DEXTROSE 5 % IV SOLN: INTRAVENOUS | Status: DC

## 2021-08-21 NOTE — Progress Notes (Addendum)
Pharmacy Antibiotic Note  Anthony Dudley is a 87 y.o. male admitted on 08/17/2021 with AKI on CKD III, metabolic acidosis, sepsis, UTI (indwelling catheter), prostate cancer. Pharmacy has been consulted for Vancomycin  D#2 for pneumonia and Cefepime D# 3 for sepsis secondary to UTI; per provider notes, pt has prior cx with MDR Proteus susceptible to cefepime.   AKI,  Scr has trending up, with most recent being 5.75. Estimated CrCl is 8.9 ml/min. S/p bilateral nephrostomy tube placement per IR done 10/6.  Urine output has increased to >3L yesterday and vancomycin random 10/8 returned at 19, indicating possible improvement in renal function. Will redose today and continue to dose vancomycin per levels. Patient is afebrile, plan to order CBC and vancomycin random tomorrow.  No standing vancomycin dose due to renal insufficiency Will check VR in AM 10/9 0500 to determine need to redose.  MRSA PCR: positive  Plan: Continue Cefepime at 1g IV Q24H Give 1250 mg of vancomycin today and recheck vancomycin random 10/9, redosing when level is <20 CBC 10/9 Monitor WBC, temp, clinical course, cultures, renal function    Height: 5\' 9"  (175.3 cm) (unable to stand, approximate measurement lying in bed.) Weight: 82 kg (180 lb 12.4 oz) IBW/kg (Calculated) : 70.7  Temp (24hrs), Avg:98.2 F (36.8 C), Min:97.4 F (36.3 C), Max:99.3 F (37.4 C)  Recent Labs  Lab 08/17/21 1504 08/18/21 0301 08/18/21 0629 08/19/21 0221 08/19/21 2219 08/20/21 0308 08/20/21 0425 08/20/21 0635 08/20/21 2340 08/21/21 0432  WBC 11.4* 11.7*  --  9.4  --  17.2*  --   --  16.7*  --   CREATININE 5.80* 5.76*  --  5.72* 6.62* 6.35*  --   --  5.98*  --   LATICACIDVEN  --  1.0 1.1  --   --   --  1.0 0.9  --   --   VANCORANDOM  --   --   --   --   --   --   --   --   --  19     Estimated Creatinine Clearance: 8.9 mL/min (A) (by C-G formula based on SCr of 5.98 mg/dL (H)).    Allergies  Allergen Reactions   Solanum Lycopersicum  [Tomato]     Patient reports he is not allergic to tomato and he eats them all the time    Antimicrobials this admission: Cefepime 10/5 >> Cefazolin X 1 for procedure 10/6 Vancomycin 10/6 >>  Microbiology results: 1 10/5 BCx - no growth to date x2 days 10/5 UC - negative  10/6 MRSA PCR: positive  Thank you for allowing pharmacy to participate in this patient's care.  Reatha Harps, PharmD PGY1 Pharmacy Resident 08/21/2021 7:32 AM Check [email or web address] for unit specific pharmacy number

## 2021-08-21 NOTE — Progress Notes (Signed)
   08/21/21 2317  Assess: MEWS Score  BP 122/65  Pulse Rate (!) 108  Resp (!) 24  SpO2 99 %  O2 Device Nasal Cannula  O2 Flow Rate (L/min) 4 L/min  Assess: MEWS Score  MEWS Temp 0  MEWS Systolic 0  MEWS Pulse 1  MEWS RR 1  MEWS LOC 0  MEWS Score 2  MEWS Score Color Yellow  Assess: if the MEWS score is Yellow or Red  Were vital signs taken at a resting state? Yes  Focused Assessment No change from prior assessment  Early Detection of Sepsis Score *See Row Information* Medium  MEWS guidelines implemented *See Row Information* No, previously yellow, continue vital signs every 4 hours  Document  Patient Outcome Stabilized after interventions  Progress note created (see row info) Yes

## 2021-08-21 NOTE — Progress Notes (Signed)
   08/21/21 1900 08/21/21 2032  Assess: MEWS Score  BP (!) 108/95 121/82  Pulse Rate (!) 114 88  Assess: MEWS Score  MEWS Temp 0 0  MEWS Systolic 0 0  MEWS Pulse 1 0  MEWS RR 1 1  MEWS LOC 1 1  MEWS Score 3 2  MEWS Score Color Yellow Yellow  Assess: if the MEWS score is Yellow or Red  Were vital signs taken at a resting state?  --  Yes  Focused Assessment  --  No change from prior assessment  Early Detection of Sepsis Score *See Row Information*  --  Medium  MEWS guidelines implemented *See Row Information*  --  No, previously yellow, continue vital signs every 4 hours  Document  Patient Outcome  --  Stabilized after interventions  Progress note created (see row info)  --  Yes

## 2021-08-21 NOTE — Progress Notes (Signed)
  Echocardiogram 2D Echocardiogram has been performed.  Anthony Dudley 08/21/2021, 1:39 PM

## 2021-08-21 NOTE — Progress Notes (Signed)
   08/21/21 2318  Assess: MEWS Score  BP 99/61  Pulse Rate (!) 111  Assess: MEWS Score  MEWS Temp 0  MEWS Systolic 1  MEWS Pulse 2  MEWS RR 1  MEWS LOC 0  MEWS Score 4  MEWS Score Color Red  Assess: if the MEWS score is Yellow or Red  Were vital signs taken at a resting state? Yes  Focused Assessment Change from prior assessment (see assessment flowsheet) (HR went up to 140's for a sec, then went back to his baseline on Amio drip)  Early Detection of Sepsis Score *See Row Information* Medium  MEWS guidelines implemented *See Row Information* No, other (Comment)  Document  Patient Outcome Stabilized after interventions  Progress note created (see row info) Yes

## 2021-08-21 NOTE — Progress Notes (Signed)
PROGRESS NOTE    Anthony Dudley  HQP:[id number] DOB: 1932/11/22 DOA: 08/17/2021 PCP: [name], MD   Chief Complaint  Patient presents with   Acute Renal Failure   Abnormal Lab   Brief Narrative:  87 yo with hx of metastatic prostate cancer, chronic indwelling foley, CKD stage III, hx MDR UTI, dementia, chronic bedbound status, HFpEF, HTN and multiple other medical problems presenting with AKI on CKD from the nursing home.    Creatinine reportedly 2.5 on 8/26.   Cr reportedly 6.1 on 10/3.  He was recently transfused with Anthony Dudley HB of 6.6 at [facility] on 08/10/21.    He's been admitted with sepsis 2/2 UTI and AKI on CKD.    Urology consult pending due to imaging concerning for bilateral hydro.  Assessment & Plan:   Principal Problem:   Acute-on-chronic kidney injury ([place]) Active Problems:   Metabolic acidosis   Sepsis ([place])   UTI (urinary tract infection) due to urinary indwelling catheter ([facility])   Prostate cancer ([facility])  Goals of care Discussed with brother 10/5.  Pt full code, but he'd like to know if anything were to happen.  Discussed [name] not Anthony Dudley great dialysis candidate and his brother seems to understand this.  Will continue medical care, follow urology c/s. Brother not ready for palliative involvement or change in code status at this time.  Will continue to follow based on [name]'s clinical status.  SVT  Hypotension Appreciate cardiology and PCCM assistance Continues with sinus tachy - in 110's at this time Started on amiodarone  Appreciate cardiology recs (10/7) - amiodarone for now, consider IVF Echo pending TSH wnl  Acute Hypoxic Respiratory Failure Severe multilobar bilateral bronchopneumonia noted Suspect compoent pulm edema with renal failure/hx HFpEF Hypotonic IVF at this time for hypernatremia Abx broadened to include vanc/cefepime Hopefully will have post obstructive diuresis after perc tubes placed - follow I/o ("since admit"  inaccurrate with CBI)  Acute Blood Loss Anemia  Acute on chronic anemia 2/2 IVF, blood loss from hematuria 2 unit pRBC Hb stable at this time, trend - still has some blood in L nephrostomy bag, right looks clearer Hb 6.6 on 9/27, s/p 2 units pRBC 09/2019 hb 9.4 Labs c/w AOCD with iron def  Sepsis 2/2 CAUTI  CAP  Leukocytosis Tachypnea, tachycardia, leukocytosis and UA concerning for UTI - meets criteria for sepsis Prior cx with MDR proteus sensitive to cefepime Continue cefepime.  Vanc added with concern for pneumonia above.  Plan for 5-7 days abx (follow clinically). Follow blood (Ngx2) and urine cultures (multiple species) Now s/p perc nephrostomy tube placement UA with large LE, many bacteria, 21-50 RBC, >50 WBC Foley exchanged in ED Urology consulted - now s/p perc nephrostomy tubes  AKI on CKD III  Bilateral Hydronephrosis  Metabolic Acidosis  Hypernatremia Creatinine 2.5 on 8/26 reportedly 6.1 on 10/3 reportedly Creatinine 5.98 - mild improvement, follow  CT with moderate bilateral hydro Renal US with bilateral hydro L>R Start hypotonic IVF, follow sodium Urology c/s, appreciate recs  S/p bilateral nephrostomy tubes hopes to internalize to antegrade ureteral stents early next week if stable per urology Will follow repeat labs  Hx of Prostate Cancer Diffuse rib sclerosis c/w metastatic disease Urology c/s - degarelix per urology PSA elevated - likely signifies widely metastatic cancer per urology, follow testosterone (39) Consider bone scan when stablized Will need treatment escalation pending further staging studies  Urethral Erosion  Urinary Retention Foley for now, per urology D/c flomax per urology  Hematuria  Due to procedure, irritation of local tumor in bladder CBI per urology -> resolved from bladder  HFpEF Fluids, volume as above  Thrombocytopenia follow  DVT prophylaxis: SCD Code Status: full Family Communication: none at bedside - brother  over phone 10/7 Disposition:   Status is: Inpatient  Remains inpatient appropriate because:Inpatient level of care appropriate due to severity of illness  Dispo: The patient is from: Home              Anticipated d/c is to: Home              Patient currently is not medically stable to d/c.   Difficult to place patient No  Consultants:  urology  Procedures:  none  Antimicrobials:  Anti-infectives (From admission, onward)    Start     Dose/Rate Route Frequency Ordered Stop   08/21/21 0830  vancomycin (VANCOREADY) IVPB 1250 mg/250 mL        1,250 mg 166.7 mL/hr over 90 Minutes Intravenous  Once 08/21/21 0733     08/20/21 1800  ceFEPIme (MAXIPIME) 1 g in sodium chloride 0.9 % 100 mL IVPB        1 g 200 mL/hr over 30 Minutes Intravenous Every 24 hours 08/20/21 1457     08/19/21 1800  ceFEPIme (MAXIPIME) 2 g in sodium chloride 0.9 % 100 mL IVPB  Status:  Discontinued        2 g 200 mL/hr over 30 Minutes Intravenous Every 24 hours 08/19/21 1553 08/20/21 1457   08/19/21 1645  vancomycin (VANCOREADY) IVPB 1500 mg/300 mL        1,500 mg 150 mL/hr over 120 Minutes Intravenous  Once 08/19/21 1551 08/19/21 1911   08/19/21 1551  vancomycin variable dose per unstable renal function (pharmacist dosing)         Does not apply See admin instructions 08/19/21 1551     08/19/21 1545  ceFAZolin (ANCEF) IVPB 2g/100 mL premix        2 g 200 mL/hr over 30 Minutes Intravenous To Radiology 08/19/21 1452 08/19/21 1655   08/19/21 0000  ceFEPIme (MAXIPIME) 1 g in sodium chloride 0.9 % 100 mL IVPB  Status:  Discontinued        1 g 200 mL/hr over 30 Minutes Intravenous Every 24 hours 08/18/21 0254 08/19/21 1553   08/18/21 0115  ceFEPIme (MAXIPIME) 1 g in sodium chloride 0.9 % 100 mL IVPB        1 g 200 mL/hr over 30 Minutes Intravenous  Once 08/18/21 0111 08/18/21 0453          Subjective: Moans mostly Follows commands inconsistently   Objective: Vitals:   08/21/21 0903 08/21/21 0930  08/21/21 1000 08/21/21 1046  BP:  108/67 (!) 116/50 104/70  Pulse: (!) 116  (!) 115 (!) 111  Resp: 20  (!) 35 (!) 23  Temp:  97.8 F (36.6 C)  98.1 F (36.7 C)  TempSrc:  Axillary  Oral  SpO2: 96% 97% 96% 97%  Weight:      Height:        Intake/Output Summary (Last 24 hours) at 08/21/2021 1117 Last data filed at 08/21/2021 0951 Gross per 24 hour  Intake 1201.19 ml  Output 3155 ml  Net -1953.81 ml   Filed Weights   08/19/21 1538 08/20/21 0700 08/21/21 0400  Weight: 81.3 kg 83.2 kg 82 kg    Examination:  General: No acute distress. Cardiovascular: tachy, ectopy  Lungs: mildly tachypneic, coarse Abdomen: Soft, nontender,  nondistended Neurological: lethargic, follows commands inconsistently. Skin: Warm and dry. No rashes or lesions. Extremities: No clubbing or cyanosis. No edema.    Data Reviewed: I have personally reviewed following labs and imaging studies  CBC: Recent Labs  Lab 08/17/21 1504 08/18/21 0301 08/19/21 0221 08/19/21 1811 08/20/21 0308 08/20/21 1250 08/20/21 2340  WBC 11.4* 11.7* 9.4  --  17.2*  --  16.7*  NEUTROABS 7.7  --  8.1*  --  15.8*  --  13.9*  HGB 8.6* 8.5* 7.7* 8.1* 6.6* 7.1* 8.4*  8.4*  HCT 27.6* 27.1* 24.0* 26.8* 20.5* 22.5* 25.6*  25.3*  MCV 100.0 100.0 99.2  --  101.5*  --  93.8  PLT 131* 127* 120*  --  129*  --  93*    Basic Metabolic Panel: Recent Labs  Lab 08/17/21 1504 08/18/21 0301 08/19/21 0221 08/19/21 2219 08/20/21 0308 08/20/21 2340  NA 140 139 145  --  146* 148*  K 4.3 4.5 4.4  --  4.8 3.7  CL 110 110 114*  --  112* 113*  CO2 17* 14* 18*  --  17* 19*  GLUCOSE 148* 127* 140*  --  111* 99  BUN 79* 79* 71*  --  78* 73*  CREATININE 5.80* 5.76* 5.72* 6.62* 6.35* 5.98*  CALCIUM 9.0 9.2 9.0  --  8.3* 8.4*  MG  --   --  1.8  --  1.6* 1.7  PHOS  --   --  4.5  --  4.2 4.3    GFR: Estimated Creatinine Clearance: 8.9 mL/min (Jerald Villalona) (by C-G formula based on SCr of 5.98 mg/dL (H)).  Liver Function Tests: Recent Labs   Lab 08/18/21 0301 08/19/21 0221 08/20/21 0308 08/20/21 2340  AST 20 14* 24 17  ALT 13 13 7 7   ALKPHOS 398* 357* 294* 269*  BILITOT 0.4 0.8 0.9 0.9  PROT 7.5 6.6 6.0* 6.0*  ALBUMIN 2.7* 2.4* 2.2* 2.2*    CBG: No results for input(s): GLUCAP in the last 168 hours.   Recent Results (from the past 240 hour(s))  Urine Culture     Status: Abnormal   Collection Time: 08/17/21 11:16 PM   Specimen: Urine, Catheterized  Result Value Ref Range Status   Specimen Description URINE, CATHETERIZED  Final   Special Requests   Final    NONE Performed at [facility], [address], [place], [place] [address]    Culture MULTIPLE SPECIES PRESENT, SUGGEST RECOLLECTION (Leotha Westermeyer)  Final   Report Status 08/18/2021 FINAL  Final  Resp Panel by RT-PCR (Flu Doak Mah&B, Covid) Nasopharyngeal Swab     Status: None   Collection Time: 08/17/21 11:19 PM   Specimen: Nasopharyngeal Swab; Nasopharyngeal(NP) swabs in vial transport medium  Result Value Ref Range Status   SARS Coronavirus 2 by RT PCR NEGATIVE NEGATIVE Final    Comment: (NOTE) SARS-CoV-2 target nucleic acids are NOT DETECTED.  The SARS-CoV-2 RNA is generally detectable in upper respiratory specimens during the acute phase of infection. The lowest concentration of SARS-CoV-2 viral copies this assay can detect is 138 copies/mL. Tamim Skog negative result does not preclude SARS-Cov-2 infection and should not be used as the sole basis for treatment or other patient management decisions. Valoree Agent negative result may occur with  improper specimen collection/handling, submission of specimen other than nasopharyngeal swab, presence of viral mutation(s) within the areas targeted by this assay, and inadequate number of viral copies(<138 copies/mL). Quamir Willemsen negative result must be combined with clinical observations, patient history, and epidemiological  information. The expected result is Negative.  Fact Sheet for Patients:  EntrepreneurPulse.com.au  Fact  Sheet for Healthcare Providers:  IncredibleEmployment.be  This test is no t yet approved or cleared by the [place] FDA and  has been authorized for detection and/or diagnosis of SARS-CoV-2 by FDA under an Emergency Use Authorization (EUA). This EUA will remain  in effect (meaning this test can be used) for the duration of the COVID-19 declaration under Section 564(b)(1) of the Act, 21 U.S.C.section 360bbb-3(b)(1), unless the authorization is terminated  or revoked sooner.       Influenza Donivin Wirt by PCR NEGATIVE NEGATIVE Final   Influenza B by PCR NEGATIVE NEGATIVE Final    Comment: (NOTE) The Xpert Xpress SARS-CoV-2/FLU/RSV plus assay is intended as an aid in the diagnosis of influenza from Nasopharyngeal swab specimens and should not be used as Aysel Gilchrest sole basis for treatment. Nasal washings and aspirates are unacceptable for Xpert Xpress SARS-CoV-2/FLU/RSV testing.  Fact Sheet for Patients: EntrepreneurPulse.com.au  Fact Sheet for Healthcare Providers: IncredibleEmployment.be  This test is not yet approved or cleared by the [place] FDA and has been authorized for detection and/or diagnosis of SARS-CoV-2 by FDA under an Emergency Use Authorization (EUA). This EUA will remain in effect (meaning this test can be used) for the duration of the COVID-19 declaration under Section 564(b)(1) of the Act, 21 U.S.C. section 360bbb-3(b)(1), unless the authorization is terminated or revoked.  Performed at [facility], [place] [address] [place], [place] [address]   Culture, blood (routine x 2)     Status: None (Preliminary result)   Collection Time: 08/18/21  2:55 AM   Specimen: BLOOD  Result Value Ref Range Status   Specimen Description BLOOD RIGHT ANTECUBITAL  Final   Special Requests   Final    BOTTLES DRAWN AEROBIC AND ANAEROBIC Blood Culture adequate volume   Culture   Final    NO GROWTH 2 DAYS Performed at [facility], [place] [address], [place], [place] [address]    Report Status PENDING  Incomplete  Culture, blood (routine x 2)     Status: None (Preliminary result)   Collection Time: 08/18/21  3:01 AM   Specimen: BLOOD  Result Value Ref Range Status   Specimen Description BLOOD BLOOD RIGHT WRIST  Final   Special Requests   Final    BOTTLES DRAWN AEROBIC AND ANAEROBIC Blood Culture adequate volume   Culture   Final    NO GROWTH 2 DAYS Performed at [facility], [place] [address], [place], [place] [address]    Report Status PENDING  Incomplete  MRSA Next Gen by PCR, Nasal     Status: Abnormal   Collection Time: 08/19/21  3:50 PM   Specimen: Nasal Mucosa; Nasal Swab  Result Value Ref Range Status   MRSA by PCR Next Gen DETECTED (Cristle Jared) NOT DETECTED Final    Comment: RESULT CALLED TO, READ BACK BY AND VERIFIED WITH: K DUFFY RN 1855 08/19/21 Benjamen Koelling BROWNING (NOTE) The GeneXpert MRSA Assay (FDA approved for NASAL specimens only), is one component of Karalee Hauter comprehensive MRSA colonization surveillance program. It is not intended to diagnose MRSA infection nor to guide or monitor treatment for MRSA infections. Test performance is not FDA approved in patients less than 17 years old. Performed at [facility], [place] [address], [place], [place] [address]          Radiology Studies: DG Chest Port 1 View  Result Date: 08/21/2021 CLINICAL  DATA:  87 year old male with history of abnormal respiration. EXAM: PORTABLE CHEST 1 VIEW COMPARISON:  Chest x-ray 08/20/2021. FINDINGS: Lung volumes are low. Diffuse interstitial prominence and patchy ill-defined and nodular opacities are noted throughout the lungs bilaterally. Small left pleural effusion. No definite right pleural effusion. No appreciable pneumothorax. Pulmonary vasculature does not appearing origin. Heart size is normal. The patient is rotated to the left on today's exam, resulting in distortion of the mediastinal contours and reduced diagnostic  sensitivity and specificity for mediastinal pathology. Atherosclerotic calcifications in the thoracic aorta. IMPRESSION: 1. Severe multilobar bilateral bronchopneumonia again noted. 2. Small left pleural effusion. 3. Aortic atherosclerosis. Electronically Signed   By: Vinnie Langton M.D.   On: 08/21/2021 10:25   DG CHEST PORT 1 VIEW  Result Date: 08/20/2021 CLINICAL DATA:  Hypoxia, respiratory failure, confusion. EXAM: PORTABLE CHEST 1 VIEW COMPARISON:  Chest radiograph 08/19/2021, CT abdomen pelvis 08/19/2021 FINDINGS: Diffuse hazy interstitial thickening and prominence of the perihilar vasculature. Unchanged retrocardiac left lower lobe opacity. No pneumothorax. Stable cardiomediastinal silhouette. Aortic calcifications. Numerous sclerotic osseous lesions, particularly within thoracolumbar spine, better appreciated on yesterday's cross-sectional imaging. IMPRESSION: Mild pulmonary edema. Retrocardiac left lower lobe opacity may represent combination of pleural fluid and atelectasis, though pneumonia is not excluded. Electronically Signed   By: Ileana Roup M.D.   On: 08/20/2021 09:32   DG CHEST PORT 1 VIEW  Result Date: 08/19/2021 CLINICAL DATA:  Hypoxia. EXAM: PORTABLE CHEST 1 VIEW COMPARISON:  08/17/2021 FINDINGS: The cardiomediastinal silhouette is unchanged with normal heart size. Aortic atherosclerosis is noted. Lung volumes are low there are increasing patchy airspace opacities throughout the left lung with milder airspace opacities on the right. There is persistent elevation of the left hemidiaphragm. No large pleural effusion or pneumothorax is identified although the patient's chin partially obscures the lung apices. Widespread sclerotic bone metastases are again noted. IMPRESSION: Increasing left greater than right lung airspace opacities concerning for pneumonia. Electronically Signed   By: Logan Bores M.D.   On: 08/19/2021 14:58   IR NEPHROSTOMY PLACEMENT LEFT  Result Date:  08/20/2021 INDICATION: History of metastatic prostate cancer, now with bilateral obstructive uropathy. Please perform placement of bilateral nephrostomy catheters for urinary diversion purposes. EXAM: ULTRASOUND AND FLUOROSCOPIC GUIDED PLACEMENT OF BILATERAL NEPHROSTOMY TUBES COMPARISON:  CT abdomen pelvis-08/19/2021 MEDICATIONS: Patient is currently admitted to the hospital receiving intravenous antibiotics.; The antibiotic was administered in an appropriate time frame prior to skin puncture. ANESTHESIA/SEDATION: Moderate (conscious) sedation was employed during this procedure, administered by the interventional radiology RN. Moranda Billiot total of Versed 0.5 mg was administered intravenously. Moderate Sedation Time: 25 minutes. The patient's level of consciousness and vital signs were monitored continuously by radiology nursing throughout the procedure under my direct supervision. CONTRAST:  40 mL Omnipaque 350-administered into both renal collecting systems. FLUOROSCOPY TIME:  1 minute, 48 seconds (1.3 mGy) COMPLICATIONS: None immediate. PROCEDURE: The procedure, risks, benefits, and alternatives were explained to the the patient's family, questions were encouraged and answered and informed consent was obtained. Jalei Shibley timeout was performed prior to the initiation of the procedure. The operative sites were prepped and draped in the usual sterile fashion and Gretel Cantu sterile drape was applied covering the operative field. Annita Ratliff sterile gown and sterile gloves were used for the procedure. Local anesthesia was provided with 1% Lidocaine with epinephrine. Beginning with the left kidney, ultrasound was used to localize the left kidney. Under direct ultrasound guidance, Maddix Heinz 20 gauge needle was advanced into the renal collecting system. An ultrasound image documentation  was performed. Access within the collecting system was confirmed with the efflux of urine followed by limited contrast injection. Under intermittent fluoroscopic guidance, an 0.018  wire was advanced into the collecting system and the tract was dilated with an Accustick stent. Next, over Sebastian Lurz short Amplatz wire, the track was further dilated ultimately allowing placement of Emari Demmer 10-French percutaneous nephrostomy catheter with end coiled and locked within the renal pelvis. Contrast was injected and several spot fluoroscopic images were obtained in various obliquities. The contralateral procedure was repeated for the right-sided nephrostomy catheter however note, an upper pole access was acquired secondary to angulation of the right kidney with the inferior pole being located deep and medial with poor sonographic visualization, accentuated due to patient's medical instability and inability to lie prone on the fluoroscopy table. Ultimately, under ultrasound fluoroscopic guidance, Alayzia Pavlock 10 French nephrostomy catheter was placed via Arrin Ishler posterosuperior calyx with end coiled and locked within the right renal pelvis. Contrast was injected several spot fluoroscopic images were obtained in various obliquities Both catheters were secured at the skin entrance site within interrupted sutures and StatLock devices. Both nephrostomies were connected to gravity bags. Dressings were applied. The patient tolerated procedure well without immediate postprocedural complication. FINDINGS: Ultrasound scanning demonstrates Miaa Latterell moderate dilated bilateral collecting systems. Under Gianni Mihalik combination of ultrasound and fluoroscopic guidance, Rolan Wrightsman left-sided posterior inferior calix was targeted allowing placement of Vladimir Lenhoff 10-French percutaneous nephrostomy catheter with end coiled and locked within the renal pelvis. Contrast injection confirmed appropriate positioning. Under Jakki Doughty combination of ultrasound and fluoroscopic guidance, Jesi Jurgens right-sided posterosuperior calyx was targeted (an inferior calyx was unable to be accessed secondary to angulation of the right kidney as well as patient's somewhat unstable status and inability to lie on the  fluoroscopy table) allowing placement of Lela Murfin 10 French percutaneous nephrostomy catheter with end coiled and locked within the renal pelvis. Contrast injection confirmed appropriate positioning. IMPRESSION: Successful ultrasound and fluoroscopic guided placement of bilateral 10 French percutaneous nephrostomy catheters. Note, as above, the left-sided nephrostomy catheter is via Rhiley Tarver posteroinferior calyx while the right-sided nephrostomy catheter is via Ashaunte Standley posterosuperior caliceal access. Electronically Signed   By: Sandi Mariscal M.D.   On: 08/20/2021 12:43   IR NEPHROSTOMY PLACEMENT RIGHT  Result Date: 08/20/2021 INDICATION: History of metastatic prostate cancer, now with bilateral obstructive uropathy. Please perform placement of bilateral nephrostomy catheters for urinary diversion purposes. EXAM: ULTRASOUND AND FLUOROSCOPIC GUIDED PLACEMENT OF BILATERAL NEPHROSTOMY TUBES COMPARISON:  CT abdomen pelvis-08/19/2021 MEDICATIONS: Patient is currently admitted to the hospital receiving intravenous antibiotics.; The antibiotic was administered in an appropriate time frame prior to skin puncture. ANESTHESIA/SEDATION: Moderate (conscious) sedation was employed during this procedure, administered by the interventional radiology RN. Aanvi Voyles total of Versed 0.5 mg was administered intravenously. Moderate Sedation Time: 25 minutes. The patient's level of consciousness and vital signs were monitored continuously by radiology nursing throughout the procedure under my direct supervision. CONTRAST:  40 mL Omnipaque 350-administered into both renal collecting systems. FLUOROSCOPY TIME:  1 minute, 48 seconds (1.3 mGy) COMPLICATIONS: None immediate. PROCEDURE: The procedure, risks, benefits, and alternatives were explained to the the patient's family, questions were encouraged and answered and informed consent was obtained. Edin Skarda timeout was performed prior to the initiation of the procedure. The operative sites were prepped and draped in the  usual sterile fashion and Aleece Loyd sterile drape was applied covering the operative field. Nicholson Starace sterile gown and sterile gloves were used for the procedure. Local anesthesia was provided with 1% Lidocaine with epinephrine. Beginning with the  left kidney, ultrasound was used to localize the left kidney. Under direct ultrasound guidance, Hudsyn Champine 20 gauge needle was advanced into the renal collecting system. An ultrasound image documentation was performed. Access within the collecting system was confirmed with the efflux of urine followed by limited contrast injection. Under intermittent fluoroscopic guidance, an 0.018 wire was advanced into the collecting system and the tract was dilated with an Accustick stent. Next, over Dariann Huckaba short Amplatz wire, the track was further dilated ultimately allowing placement of Libby Goehring 10-French percutaneous nephrostomy catheter with end coiled and locked within the renal pelvis. Contrast was injected and several spot fluoroscopic images were obtained in various obliquities. The contralateral procedure was repeated for the right-sided nephrostomy catheter however note, an upper pole access was acquired secondary to angulation of the right kidney with the inferior pole being located deep and medial with poor sonographic visualization, accentuated due to patient's medical instability and inability to lie prone on the fluoroscopy table. Ultimately, under ultrasound fluoroscopic guidance, Liston Thum 10 French nephrostomy catheter was placed via Achille Xiang posterosuperior calyx with end coiled and locked within the right renal pelvis. Contrast was injected several spot fluoroscopic images were obtained in various obliquities Both catheters were secured at the skin entrance site within interrupted sutures and StatLock devices. Both nephrostomies were connected to gravity bags. Dressings were applied. The patient tolerated procedure well without immediate postprocedural complication. FINDINGS: Ultrasound scanning demonstrates Zaidyn Claire  moderate dilated bilateral collecting systems. Under Mea Ozga combination of ultrasound and fluoroscopic guidance, Karyn Brull left-sided posterior inferior calix was targeted allowing placement of Beola Vasallo 10-French percutaneous nephrostomy catheter with end coiled and locked within the renal pelvis. Contrast injection confirmed appropriate positioning. Under Doyne Ellinger combination of ultrasound and fluoroscopic guidance, Laverne Hursey right-sided posterosuperior calyx was targeted (an inferior calyx was unable to be accessed secondary to angulation of the right kidney as well as patient's somewhat unstable status and inability to lie on the fluoroscopy table) allowing placement of Naftoli Penny 10 French percutaneous nephrostomy catheter with end coiled and locked within the renal pelvis. Contrast injection confirmed appropriate positioning. IMPRESSION: Successful ultrasound and fluoroscopic guided placement of bilateral 10 French percutaneous nephrostomy catheters. Note, as above, the left-sided nephrostomy catheter is via Susannah Carbin posteroinferior calyx while the right-sided nephrostomy catheter is via Sonny Poth posterosuperior caliceal access. Electronically Signed   By: Sandi Mariscal M.D.   On: 08/20/2021 12:43        Scheduled Meds:  sodium chloride   Intravenous Once   sodium chloride   Intravenous Once   Chlorhexidine Gluconate Cloth  6 each Topical Daily   Chlorhexidine Gluconate Cloth  6 each Topical Q0600   magnesium oxide  400 mg Oral BID   mupirocin ointment  1 application Nasal BID   oxybutynin  5 mg Oral QHS   tamsulosin  0.4 mg Oral Daily   vancomycin variable dose per unstable renal function (pharmacist dosing)   Does not apply See admin instructions   Continuous Infusions:  amiodarone 30 mg/hr (08/21/21 0702)   ceFEPime (MAXIPIME) IV 1 g (08/20/21 2008)   dextrose 50 mL/hr at 08/21/21 1037   lactated ringers     sodium chloride irrigation     vancomycin 1,250 mg (08/21/21 1040)     LOS: 3 days    Time spent: over 30 min   Fayrene Helper,  MD [facility]   To contact the attending provider between 7A-7P or the covering provider during after hours 7P-7A, please log into the web site [email or web address] and access using universal [place]  password for that web site. If you do not have the password, please call the hospital operator.  08/21/2021, 11:17 AM

## 2021-08-21 NOTE — Progress Notes (Signed)
Patient ID: Anthony Dudley, male   DOB: 1933-05-28, 87 y.o.   MRN: [id number]  3 Days Post-Op Subjective: Pt s/p bilateral nephrostomy tube placement 10/6  Hgb 8.4 post transfusion.  Cr  down slightly to 5.98.  He is non-communicative but doesn't appear in distress.    Objective: Vital signs in last 24 hours: Temp:  [97.5 F (36.4 C)-99.3 F (37.4 C)] 98.1 F (36.7 C) (10/08 1046) Pulse Rate:  [90-144] 111 (10/08 1046) Resp:  [18-35] 23 (10/08 1046) BP: (92-122)/(45-83) 104/70 (10/08 1046) SpO2:  [94 %-98 %] 96 % (10/08 1000) Weight:  [82 kg] 82 kg (10/08 0400)  Intake/Output from previous day: 10/07 0701 - 10/08 0700 In: 1376.2 [P.O.:120; I.V.:526.2; Blood:590; IV Piggyback:100] Out: 6195 [Urine:3320] Intake/Output this shift: Total I/O In: 110 [P.O.:60; I.V.:50] Out: 310 [Urine:310]  Physical Exam:  General: Sitting up but non-communicative. GU: Bilateral nephrostomy tubes with large amount of UOP, red tinged but draining well, Urethral catheter with minimal output, clear  Lab Results: Recent Labs    08/20/21 0308 08/20/21 1250 08/20/21 2340  HGB 6.6* 7.1* 8.4*  8.4*  HCT 20.5* 22.5* 25.6*  25.3*   BMET Recent Labs    08/20/21 0308 08/20/21 2340  NA 146* 148*  K 4.8 3.7  CL 112* 113*  CO2 17* 19*  GLUCOSE 111* 99  BUN 78* 73*  CREATININE 6.35* 5.98*  CALCIUM 8.3* 8.4*     Studies/Results: DG Chest Port 1 View  Result Date: 08/21/2021 CLINICAL DATA:  87 year old male with history of abnormal respiration. EXAM: PORTABLE CHEST 1 VIEW COMPARISON:  Chest x-ray 08/20/2021. FINDINGS: Lung volumes are low. Diffuse interstitial prominence and patchy ill-defined and nodular opacities are noted throughout the lungs bilaterally. Small left pleural effusion. No definite right pleural effusion. No appreciable pneumothorax. Pulmonary vasculature does not appearing origin. Heart size is normal. The patient is rotated to the left on today's exam, resulting in distortion of  the mediastinal contours and reduced diagnostic sensitivity and specificity for mediastinal pathology. Atherosclerotic calcifications in the thoracic aorta. IMPRESSION: 1. Severe multilobar bilateral bronchopneumonia again noted. 2. Small left pleural effusion. 3. Aortic atherosclerosis. Electronically Signed   By: Vinnie Langton M.D.   On: 08/21/2021 10:25   DG CHEST PORT 1 VIEW  Result Date: 08/20/2021 CLINICAL DATA:  Hypoxia, respiratory failure, confusion. EXAM: PORTABLE CHEST 1 VIEW COMPARISON:  Chest radiograph 08/19/2021, CT abdomen pelvis 08/19/2021 FINDINGS: Diffuse hazy interstitial thickening and prominence of the perihilar vasculature. Unchanged retrocardiac left lower lobe opacity. No pneumothorax. Stable cardiomediastinal silhouette. Aortic calcifications. Numerous sclerotic osseous lesions, particularly within thoracolumbar spine, better appreciated on yesterday's cross-sectional imaging. IMPRESSION: Mild pulmonary edema. Retrocardiac left lower lobe opacity may represent combination of pleural fluid and atelectasis, though pneumonia is not excluded. Electronically Signed   By: Ileana Roup M.D.   On: 08/20/2021 09:32   DG CHEST PORT 1 VIEW  Result Date: 08/19/2021 CLINICAL DATA:  Hypoxia. EXAM: PORTABLE CHEST 1 VIEW COMPARISON:  08/17/2021 FINDINGS: The cardiomediastinal silhouette is unchanged with normal heart size. Aortic atherosclerosis is noted. Lung volumes are low there are increasing patchy airspace opacities throughout the left lung with milder airspace opacities on the right. There is persistent elevation of the left hemidiaphragm. No large pleural effusion or pneumothorax is identified although the patient's chin partially obscures the lung apices. Widespread sclerotic bone metastases are again noted. IMPRESSION: Increasing left greater than right lung airspace opacities concerning for pneumonia. Electronically Signed   By: Logan Bores M.D.   On:  08/19/2021 14:58   IR  NEPHROSTOMY PLACEMENT LEFT  Result Date: 08/20/2021 INDICATION: History of metastatic prostate cancer, now with bilateral obstructive uropathy. Please perform placement of bilateral nephrostomy catheters for urinary diversion purposes. EXAM: ULTRASOUND AND FLUOROSCOPIC GUIDED PLACEMENT OF BILATERAL NEPHROSTOMY TUBES COMPARISON:  CT abdomen pelvis-08/19/2021 MEDICATIONS: Patient is currently admitted to the hospital receiving intravenous antibiotics.; The antibiotic was administered in an appropriate time frame prior to skin puncture. ANESTHESIA/SEDATION: Moderate (conscious) sedation was employed during this procedure, administered by the interventional radiology RN. A total of Versed 0.5 mg was administered intravenously. Moderate Sedation Time: 25 minutes. The patient's level of consciousness and vital signs were monitored continuously by radiology nursing throughout the procedure under my direct supervision. CONTRAST:  40 mL Omnipaque 350-administered into both renal collecting systems. FLUOROSCOPY TIME:  1 minute, 48 seconds (1.3 mGy) COMPLICATIONS: None immediate. PROCEDURE: The procedure, risks, benefits, and alternatives were explained to the the patient's family, questions were encouraged and answered and informed consent was obtained. A timeout was performed prior to the initiation of the procedure. The operative sites were prepped and draped in the usual sterile fashion and a sterile drape was applied covering the operative field. A sterile gown and sterile gloves were used for the procedure. Local anesthesia was provided with 1% Lidocaine with epinephrine. Beginning with the left kidney, ultrasound was used to localize the left kidney. Under direct ultrasound guidance, a 20 gauge needle was advanced into the renal collecting system. An ultrasound image documentation was performed. Access within the collecting system was confirmed with the efflux of urine followed by limited contrast injection. Under  intermittent fluoroscopic guidance, an 0.018 wire was advanced into the collecting system and the tract was dilated with an Accustick stent. Next, over a short Amplatz wire, the track was further dilated ultimately allowing placement of a 10-French percutaneous nephrostomy catheter with end coiled and locked within the renal pelvis. Contrast was injected and several spot fluoroscopic images were obtained in various obliquities. The contralateral procedure was repeated for the right-sided nephrostomy catheter however note, an upper pole access was acquired secondary to angulation of the right kidney with the inferior pole being located deep and medial with poor sonographic visualization, accentuated due to patient's medical instability and inability to lie prone on the fluoroscopy table. Ultimately, under ultrasound fluoroscopic guidance, a 10 French nephrostomy catheter was placed via a posterosuperior calyx with end coiled and locked within the right renal pelvis. Contrast was injected several spot fluoroscopic images were obtained in various obliquities Both catheters were secured at the skin entrance site within interrupted sutures and StatLock devices. Both nephrostomies were connected to gravity bags. Dressings were applied. The patient tolerated procedure well without immediate postprocedural complication. FINDINGS: Ultrasound scanning demonstrates a moderate dilated bilateral collecting systems. Under a combination of ultrasound and fluoroscopic guidance, a left-sided posterior inferior calix was targeted allowing placement of a 10-French percutaneous nephrostomy catheter with end coiled and locked within the renal pelvis. Contrast injection confirmed appropriate positioning. Under a combination of ultrasound and fluoroscopic guidance, a right-sided posterosuperior calyx was targeted (an inferior calyx was unable to be accessed secondary to angulation of the right kidney as well as patient's somewhat unstable  status and inability to lie on the fluoroscopy table) allowing placement of a 10 French percutaneous nephrostomy catheter with end coiled and locked within the renal pelvis. Contrast injection confirmed appropriate positioning. IMPRESSION: Successful ultrasound and fluoroscopic guided placement of bilateral 10 French percutaneous nephrostomy catheters. Note, as above, the left-sided nephrostomy  catheter is via a posteroinferior calyx while the right-sided nephrostomy catheter is via a posterosuperior caliceal access. Electronically Signed   By: Sandi Mariscal M.D.   On: 08/20/2021 12:43   IR NEPHROSTOMY PLACEMENT RIGHT  Result Date: 08/20/2021 INDICATION: History of metastatic prostate cancer, now with bilateral obstructive uropathy. Please perform placement of bilateral nephrostomy catheters for urinary diversion purposes. EXAM: ULTRASOUND AND FLUOROSCOPIC GUIDED PLACEMENT OF BILATERAL NEPHROSTOMY TUBES COMPARISON:  CT abdomen pelvis-08/19/2021 MEDICATIONS: Patient is currently admitted to the hospital receiving intravenous antibiotics.; The antibiotic was administered in an appropriate time frame prior to skin puncture. ANESTHESIA/SEDATION: Moderate (conscious) sedation was employed during this procedure, administered by the interventional radiology RN. A total of Versed 0.5 mg was administered intravenously. Moderate Sedation Time: 25 minutes. The patient's level of consciousness and vital signs were monitored continuously by radiology nursing throughout the procedure under my direct supervision. CONTRAST:  40 mL Omnipaque 350-administered into both renal collecting systems. FLUOROSCOPY TIME:  1 minute, 48 seconds (1.3 mGy) COMPLICATIONS: None immediate. PROCEDURE: The procedure, risks, benefits, and alternatives were explained to the the patient's family, questions were encouraged and answered and informed consent was obtained. A timeout was performed prior to the initiation of the procedure. The operative  sites were prepped and draped in the usual sterile fashion and a sterile drape was applied covering the operative field. A sterile gown and sterile gloves were used for the procedure. Local anesthesia was provided with 1% Lidocaine with epinephrine. Beginning with the left kidney, ultrasound was used to localize the left kidney. Under direct ultrasound guidance, a 20 gauge needle was advanced into the renal collecting system. An ultrasound image documentation was performed. Access within the collecting system was confirmed with the efflux of urine followed by limited contrast injection. Under intermittent fluoroscopic guidance, an 0.018 wire was advanced into the collecting system and the tract was dilated with an Accustick stent. Next, over a short Amplatz wire, the track was further dilated ultimately allowing placement of a 10-French percutaneous nephrostomy catheter with end coiled and locked within the renal pelvis. Contrast was injected and several spot fluoroscopic images were obtained in various obliquities. The contralateral procedure was repeated for the right-sided nephrostomy catheter however note, an upper pole access was acquired secondary to angulation of the right kidney with the inferior pole being located deep and medial with poor sonographic visualization, accentuated due to patient's medical instability and inability to lie prone on the fluoroscopy table. Ultimately, under ultrasound fluoroscopic guidance, a 10 French nephrostomy catheter was placed via a posterosuperior calyx with end coiled and locked within the right renal pelvis. Contrast was injected several spot fluoroscopic images were obtained in various obliquities Both catheters were secured at the skin entrance site within interrupted sutures and StatLock devices. Both nephrostomies were connected to gravity bags. Dressings were applied. The patient tolerated procedure well without immediate postprocedural complication. FINDINGS:  Ultrasound scanning demonstrates a moderate dilated bilateral collecting systems. Under a combination of ultrasound and fluoroscopic guidance, a left-sided posterior inferior calix was targeted allowing placement of a 10-French percutaneous nephrostomy catheter with end coiled and locked within the renal pelvis. Contrast injection confirmed appropriate positioning. Under a combination of ultrasound and fluoroscopic guidance, a right-sided posterosuperior calyx was targeted (an inferior calyx was unable to be accessed secondary to angulation of the right kidney as well as patient's somewhat unstable status and inability to lie on the fluoroscopy table) allowing placement of a 10 French percutaneous nephrostomy catheter with end coiled and locked within  the renal pelvis. Contrast injection confirmed appropriate positioning. IMPRESSION: Successful ultrasound and fluoroscopic guided placement of bilateral 10 French percutaneous nephrostomy catheters. Note, as above, the left-sided nephrostomy catheter is via a posteroinferior calyx while the right-sided nephrostomy catheter is via a posterosuperior caliceal access. Electronically Signed   By: Sandi Mariscal M.D.   On: 08/20/2021 12:43    Assessment/Plan: Bilateral hydronephrosis/AKI/sepsis: Continue antibiotic therapy/supportive care. S/P bilateral nephrostomy tube placement yesterday.  Renal function not improved but very likely due to pre-renal causes secondary to hypotension from SVT/bleeding.  Continue nephrostomy drainage with hopes to internalize to antegrade ureteral stents possibly early next week once stable.  Hematuria: Resolved from bladder.     3.Metastatic prostate cancer: PSA significantly elevated consistent with probable widespread metastatic prostate cancer. Testosterone 39. CT scan yesterday without lymphadenopathy but with extensive sclerotic bone metastases.  Will consider bone scan once acute issues stabilized to further assess extent of  disease.  Will need treatment escalation eventually with androgen receptor blockade/androgen synthesis inhibition as outpatient.  4. Urethral erosion/urinary retention:  Continue Foley for now.  Does not need to continue tamsulosin.  Will consider SP tube placement eventually (likely at time of next stent change when he gets general anesthesia).  Will likely have minimal output from urethral catheter with nephrostomy tubes in place.     LOS: 3 days   Irine Seal 08/21/2021, 10:49 AM   Patient ID: Anthony Dudley, male   DOB: 06-10-1933, 87 y.o.   MRN: [id number]

## 2021-08-21 NOTE — Progress Notes (Signed)
   08/21/21 1900  Assess: MEWS Score  Temp 98.1 F (36.7 C)  BP (!) 108/95  Pulse Rate (!) 114  ECG Heart Rate (!) 109  Resp (!) 25  SpO2 96 %  O2 Device Nasal Cannula  O2 Flow Rate (L/min) 4 L/min  Assess: MEWS Score  MEWS Temp 0  MEWS Systolic 0  MEWS Pulse 1  MEWS RR 1  MEWS LOC 1  MEWS Score 3  MEWS Score Color Yellow  Assess: if the MEWS score is Yellow or Red  Were vital signs taken at a resting state? Yes  Focused Assessment No change from prior assessment  Early Detection of Sepsis Score *See Row Information* Medium  MEWS guidelines implemented *See Row Information* No, previously yellow, continue vital signs every 4 hours  Document  Patient Outcome Stabilized after interventions  Progress note created (see row info) Yes

## 2021-08-21 NOTE — Progress Notes (Signed)
   NAME:  Anthony Dudley, MRN:  [id number], DOB:  1933-06-15, LOS: 3 ADMISSION DATE:  08/17/2021, CONSULTATION DATE:  08/20/2021 REFERRING MD:  Harrold Donath MD, CHIEF COMPLAINT:   08/20/2021  History of Present Illness:  87 year old with prostate cancer, chronic indwelling Foley, CKD, MDR UTI, dementia, HFpEF, hypertension presenting with urosepsis, AKI on CKD, bilateral hydronephrosis.  Urology was consulted and attempted retrograde ureteral stent placement was unsuccessful and he had bilateral nephrostomy tubes placed by IR last night.  Rapid response called for SVT and he became hypotensive after amiodarone was initiated.  PCCM consulted  Significant Hospital Events: Including procedures, antibiotic start and stop dates in addition to other pertinent events   10/4 Admit 10/5 Attempted bilateral ureteral stent placement was unsuccessful due to extensive local tumor growth likely related to his prostate cancer.  He began to have bleeding from his tumor growth at his bladder trigone.  Started on continuous bladder irrigation 10/6 Bilateral nephrostomy tubes by IR  Interim History / Subjective:  Hemoglobin remained stable after transfusion of 2 PRBCs Serum creatinine started improving after nephrostomy tubes  Objective   Blood pressure 99/60, pulse 99, temperature 98.3 F (36.8 C), temperature source Axillary, resp. rate 19, height 5\' 9"  (1.753 m), weight 82 kg, SpO2 99 %.        Intake/Output Summary (Last 24 hours) at 08/21/2021 1401 Last data filed at 08/21/2021 1342 Gross per 24 hour  Intake 1191.19 ml  Output 3290 ml  Net -2098.81 ml   Filed Weights   08/19/21 1538 08/20/21 0700 08/21/21 0400  Weight: 81.3 kg 83.2 kg 82 kg    Examination: Gen:   Elderly Caucasian male, lying on the bed HEENT:  EOMI, sclera anicteric, dry mucous membranes Neck:     No masses; no thyromegaly Lungs:    Clear to auscultation bilaterally; normal respiratory effort CV:         Regular rate and  rhythm; no murmurs Abd:      Mild abdominal tenderness Ext:    No edema; adequate peripheral perfusion Skin:      Warm and dry; no rash Neuro: Somnolent, arousable.,  Following simple commands  Resolved Hospital Problem list     Assessment & Plan:  SVT with hypotension, resolved Chronic HFpEF Cardiology is following Patient is on amiodarone Currently in sinus tachycardia with heart rate 100 - 110  Sepsis, present on admission secondary to complicated UTI with bilateral hydronephrosis S/p bilateral nephrostomy tubes by urology Hemodynamically stable heart rate controlled blood pressure systolic greater than 762 Continue antimicrobial therapy Follow-up cultures and adjust antibiotics accordingly  Acute hypoxic respiratory failure secondary to HAP X-ray chest confirmed bilateral multifocal infiltrates Continue oxygen via nasal cannula to maintain O2 sat 92% Continue antibiotics  AKI on CKD 3b Monitor urine output creatinine Serum creatinine is slightly better  Acute blood loss anemia s/p PRBC transfusion H&H is stable, closely monitor Transfuse if <8  Metastatic prostate cancer with bone mets Continue outpatient hormone therapy   Goals of care discussion.  PCCM will sign off please call with questions      Jacky Kindle MD [facility] See Amion for pager If no response to pager, please call [phone] until 7pm After 7pm, Please call E-link [phone]

## 2021-08-22 DIAGNOSIS — N179 Acute kidney failure, unspecified: Secondary | ICD-10-CM | POA: Diagnosis not present

## 2021-08-22 DIAGNOSIS — N183 Chronic kidney disease, stage 3 unspecified: Secondary | ICD-10-CM | POA: Diagnosis not present

## 2021-08-22 LAB — BASIC METABOLIC PANEL
Anion gap: 13 (ref 5–15)
Anion gap: 13 (ref 5–15)
BUN: 64 mg/dL — ABNORMAL HIGH (ref 8–23)
BUN: 58 mg/dL — ABNORMAL HIGH (ref 8–23)
CO2: 20 mmol/L — ABNORMAL LOW (ref 22–32)
CO2: 17 mmol/L — ABNORMAL LOW (ref 22–32)
Calcium: 8.7 mg/dL — ABNORMAL LOW (ref 8.9–10.3)
Calcium: 8.5 mg/dL — ABNORMAL LOW (ref 8.9–10.3)
Chloride: 114 mmol/L — ABNORMAL HIGH (ref 98–111)
Chloride: 114 mmol/L — ABNORMAL HIGH (ref 98–111)
Creatinine, Ser: 5.28 mg/dL — ABNORMAL HIGH (ref 0.61–1.24)
Creatinine, Ser: 4.65 mg/dL — ABNORMAL HIGH (ref 0.61–1.24)
GFR, Estimated: 10 mL/min — ABNORMAL LOW (ref >60)
GFR, Estimated: 12 mL/min — ABNORMAL LOW (ref >60)
Glucose, Bld: 140 mg/dL — ABNORMAL HIGH (ref 70–99)
Glucose, Bld: 132 mg/dL — ABNORMAL HIGH (ref 70–99)
Potassium: 3.1 mmol/L — ABNORMAL LOW (ref 3.5–5.1)
Potassium: 3.5 mmol/L (ref 3.5–5.1)
Sodium: 147 mmol/L — ABNORMAL HIGH (ref 135–145)
Sodium: 144 mmol/L (ref 135–145)

## 2021-08-22 LAB — CBC
HCT: 24.7 % — ABNORMAL LOW (ref 39.0–52.0)
Hemoglobin: 8 g/dL — ABNORMAL LOW (ref 13.0–17.0)
MCH: 30.2 pg (ref 26.0–34.0)
MCHC: 32.4 g/dL (ref 30.0–36.0)
MCV: 93.2 fL (ref 80.0–100.0)
Platelets: 88 10*3/uL — ABNORMAL LOW (ref 150–400)
RBC: 2.65 MIL/uL — ABNORMAL LOW (ref 4.22–5.81)
RDW: 18.9 % — ABNORMAL HIGH (ref 11.5–15.5)
WBC: 14.3 10*3/uL — ABNORMAL HIGH (ref 4.0–10.5)
nRBC: 0.1 % (ref 0.0–0.2)

## 2021-08-22 LAB — HEMOGLOBIN AND HEMATOCRIT, BLOOD
HCT: 26.3 % — ABNORMAL LOW (ref 39.0–52.0)
Hemoglobin: 8.5 g/dL — ABNORMAL LOW (ref 13.0–17.0)

## 2021-08-22 LAB — VANCOMYCIN, RANDOM: Vancomycin Rm: 35

## 2021-08-22 MED ORDER — SODIUM CHLORIDE 0.9 % IV SOLN
2.0000 g | INTRAVENOUS | Status: AC
  Administered 2021-08-22 – 2021-08-24 (×3): 2 g via INTRAVENOUS
  Filled 2021-08-22 (×3): qty 2

## 2021-08-22 MED ORDER — HEPARIN SODIUM (PORCINE) 5000 UNIT/ML IJ SOLN: 5000.0000 [IU] | Freq: Three times a day (TID) | INTRAMUSCULAR | Status: DC

## 2021-08-22 MED ORDER — ACETAMINOPHEN 500 MG PO TABS
1000.0000 mg | ORAL_TABLET | Freq: Three times a day (TID) | ORAL | Status: AC
Start: 2021-08-22 — End: 2021-08-25
  Administered 2021-08-22 – 2021-08-24 (×7): 1000 mg via ORAL
  Filled 2021-08-22 (×8): qty 2

## 2021-08-22 MED ORDER — ACETAMINOPHEN 325 MG PO TABS
650.0000 mg | ORAL_TABLET | Freq: Four times a day (QID) | ORAL | Status: DC | PRN
  Administered 2021-09-03 – 2021-09-06 (×4): 650 mg via ORAL
  Filled 2021-08-22 (×4): qty 2

## 2021-08-22 MED ORDER — POTASSIUM CHLORIDE 20 MEQ PO PACK
40.0000 meq | PACK | Freq: Once | ORAL | Status: AC
  Administered 2021-08-22: 40 meq via ORAL
  Filled 2021-08-22: qty 2

## 2021-08-22 NOTE — Progress Notes (Signed)
   08/22/21 0018  Assess: MEWS Score  Temp 98.2 F (36.8 C)  BP (!) 117/47  Pulse Rate (!) 105  Resp (!) 24  SpO2 99 %  O2 Device Nasal Cannula  O2 Flow Rate (L/min) 4 L/min  Assess: MEWS Score  MEWS Temp 0  MEWS Systolic 0  MEWS Pulse 1  MEWS RR 1  MEWS LOC 0  MEWS Score 2  MEWS Score Color Yellow  Assess: if the MEWS score is Yellow or Red  Were vital signs taken at a resting state? Yes  Focused Assessment No change from prior assessment  Early Detection of Sepsis Score *See Row Information* Medium  MEWS guidelines implemented *See Row Information* No, previously yellow, continue vital signs every 4 hours (HR back normal)  Document  Patient Outcome Stabilized after interventions  Progress note created (see row info) Yes

## 2021-08-22 NOTE — Progress Notes (Signed)
Pharmacy Antibiotic Note  Anthony Dudley is a 87 y.o. male admitted on 08/17/2021 with AKI on CKD III, metabolic acidosis, sepsis, UTI (indwelling catheter), prostate cancer. Pharmacy has been consulted for Vancomycin  D#2 for pneumonia and Cefepime D# 3 for sepsis secondary to UTI; per provider notes, pt has prior cx with MDR Proteus susceptible to cefepime.   AKI,  Scr has trended down, with most recent being 5.28. Estimated CrCl is 10 ml/min today s/p bilateral nephrostomy tube placement per IR done 10/6. Will adjust Cefepime for new CrCl of10. Urine output has increased to however vancomycin random 10/9 returned at 35. There is no need to redose today, will continue dosing per levels. Patient is afebrile and WBC trending down.  No standing vancomycin dose due to renal insufficiency Will check VR in AM 10/9 0500 to determine need to redose.  MRSA PCR: positive  Plan: Increase Cefepime to 2g IV Q24H Redose vancomycin when random level is <20 Monitor WBC, temp, clinical course, cultures, renal function    Height: 5\' 9"  (175.3 cm) (unable to stand, approximate measurement lying in bed.) Weight: 82.8 kg (182 lb 8.7 oz) IBW/kg (Calculated) : 70.7  Temp (24hrs), Avg:98.1 F (36.7 C), Min:97.8 F (36.6 C), Max:98.4 F (36.9 C)  Recent Labs  Lab 08/18/21 0301 08/18/21 0629 08/19/21 0221 08/19/21 2219 08/20/21 0308 08/20/21 0425 08/20/21 0635 08/20/21 2340 08/21/21 0432 08/21/21 1122 08/21/21 2010 08/22/21 0204  WBC 11.7*  --  9.4  --  17.2*  --   --  16.7*  --   --   --  14.3*  CREATININE 5.76*  --  5.72*   < > 6.35*  --   --  5.98*  --  5.75* 5.46* 5.28*  LATICACIDVEN 1.0 1.1  --   --   --  1.0 0.9  --   --   --   --   --   VANCORANDOM  --   --   --   --   --   --   --   --  19  --   --  35   < > = values in this interval not displayed.     Estimated Creatinine Clearance: 10 mL/min (A) (by C-G formula based on SCr of 5.28 mg/dL (H)).    Allergies  Allergen Reactions    Solanum Lycopersicum [Tomato]     Patient reports he is not allergic to tomato and he eats them all the time    Antimicrobials this admission: Cefepime 10/5 >> Cefazolin X 1 for procedure 10/6 Vancomycin 10/6 >>  Microbiology results: 1 10/5 BCx - no growth to date x2 days 10/5 UC - negative  10/6 MRSA PCR: positive  Thank you for allowing pharmacy to participate in this patient's care.  Reatha Harps, PharmD PGY1 Pharmacy Resident 08/22/2021 7:19 AM Check [email or web address] for unit specific pharmacy number

## 2021-08-22 NOTE — Progress Notes (Signed)
PROGRESS NOTE    Anthony Dudley  FXT:[id number] DOB: 24-Nov-1932 DOA: 08/17/2021 PCP: [name], MD   Chief Complaint  Patient presents with   Acute Renal Failure   Abnormal Lab   Brief Narrative:  87 yo with hx of metastatic prostate cancer, chronic indwelling foley, CKD stage III, hx MDR UTI, dementia, chronic bedbound status, HFpEF, HTN and multiple other medical problems presenting with AKI on CKD from the nursing home.    Creatinine reportedly 2.5 on 8/26.   Cr reportedly 6.1 on 10/3.  He was recently transfused with [place]ani Kidney HB of 6.6 at [facility] on 08/10/21.    He's been admitted with sepsis 2/2 UTI and AKI on CKD.    Urology consult pending due to imaging concerning for bilateral hydro.  Assessment & Plan:   Principal Problem:   Acute-on-chronic kidney injury ([place]) Active Problems:   Metabolic acidosis   Sepsis ([place])   UTI (urinary tract infection) due to urinary indwelling catheter (HCC)   Prostate cancer (HCC)  Goals of care Discussed with brother 10/5.  Pt full code, but he'd like to know if anything were to happen.  Discussed [name] not Jannelly Bergren great dialysis candidate and his brother seems to understand this.  Will continue medical care, follow urology c/s. Brother not ready for palliative involvement or change in code status at this time.  Will continue to follow based on [name]'s clinical status.  SVT  Hypotension Appreciate cardiology and PCCM assistance Currently in sinus rhythm  Started on amiodarone  Appreciate cardiology recs (10/7) - amiodarone for now, consider IVF Echo with EF 55-60% (see report)  TSH wnl  Acute Hypoxic Respiratory Failure Severe multilobar bilateral bronchopneumonia noted Suspect compoent pulm edema with renal failure/hx HFpEF Hypotonic IVF at this time for hypernatremia Abx broadened to include vanc/cefepime Hopefully will have post obstructive diuresis after perc tubes placed - follow I/o ("since admit"  inaccurrate with CBI)  Acute Blood Loss Anemia  Acute on chronic anemia 2/2 IVF, blood loss from hematuria S/p 2 unit pRBC -> trend Blood in L>R nephrostomy tubes - hold further heparin  Hb 6.6 on 9/27, s/p 2 units pRBC 09/2019 hb 9.4 Labs c/w AOCD with iron def  Sepsis 2/2 CAUTI  CAP  Leukocytosis Tachypnea, tachycardia, leukocytosis and UA concerning for UTI - meets criteria for sepsis Prior cx with MDR proteus sensitive to cefepime Continue cefepime.  Vanc added with concern for pneumonia above.  Plan for 5-7 days abx (follow clinically). Follow blood (Ngx2) and urine cultures (multiple species) Now s/p perc nephrostomy tube placement UA with large LE, many bacteria, 21-50 RBC, >50 WBC Foley exchanged in ED Urology consulted - now s/p perc nephrostomy tubes  AKI on CKD III  Bilateral Hydronephrosis  Metabolic Acidosis  Hypernatremia Creatinine 2.5 on 8/26 reportedly 6.1 on 10/3 reportedly Creatinine 5.98 - mild improvement, follow  CT with moderate bilateral hydro Renal US with bilateral hydro L>R Start hypotonic IVF, follow sodium - gradual improvemen Urology c/s, appreciate recs  S/p bilateral nephrostomy tubes hopes to internalize to antegrade ureteral stents early next week if stable per urology Will follow repeat labs  Hx of Prostate Cancer Diffuse rib sclerosis c/w metastatic disease Urology c/s - degarelix per urology PSA elevated - likely signifies widely metastatic cancer per urology, follow testosterone (39) Consider bone scan when stablized Will need treatment escalation pending further staging studies  Urethral Erosion  Urinary Retention Foley for now, per urology D/c flomax per urology  Hematuria  Due to procedure, irritation of local tumor in bladder CBI per urology -> resolved from bladder  HFpEF Fluids, volume as above  Thrombocytopenia follow  DVT prophylaxis: SCD Code Status: full Family Communication: none at bedside - brother over  phone 10/8  Disposition:   Status is: Inpatient  Remains inpatient appropriate because:Inpatient level of care appropriate due to severity of illness  Dispo: The patient is from: Home              Anticipated d/c is to: Home              Patient currently is not medically stable to d/c.   Difficult to place patient No  Consultants:  urology  Procedures:  none  Antimicrobials:  Anti-infectives (From admission, onward)    Start     Dose/Rate Route Frequency Ordered Stop   08/22/21 1800  ceFEPIme (MAXIPIME) 2 g in sodium chloride 0.9 % 100 mL IVPB        2 g 200 mL/hr over 30 Minutes Intravenous Every 24 hours 08/22/21 0727     08/21/21 0830  vancomycin (VANCOREADY) IVPB 1250 mg/250 mL        1,250 mg 166.7 mL/hr over 90 Minutes Intravenous  Once 08/21/21 0733 08/21/21 1842   08/20/21 1800  ceFEPIme (MAXIPIME) 1 g in sodium chloride 0.9 % 100 mL IVPB  Status:  Discontinued        1 g 200 mL/hr over 30 Minutes Intravenous Every 24 hours 08/20/21 1457 08/22/21 0727   08/19/21 1800  ceFEPIme (MAXIPIME) 2 g in sodium chloride 0.9 % 100 mL IVPB  Status:  Discontinued        2 g 200 mL/hr over 30 Minutes Intravenous Every 24 hours 08/19/21 1553 08/20/21 1457   08/19/21 1645  vancomycin (VANCOREADY) IVPB 1500 mg/300 mL        1,500 mg 150 mL/hr over 120 Minutes Intravenous  Once 08/19/21 1551 08/19/21 1911   08/19/21 1551  vancomycin variable dose per unstable renal function (pharmacist dosing)         Does not apply See admin instructions 08/19/21 1551     08/19/21 1545  ceFAZolin (ANCEF) IVPB 2g/100 mL premix        2 g 200 mL/hr over 30 Minutes Intravenous To Radiology 08/19/21 1452 08/19/21 1655   08/19/21 0000  ceFEPIme (MAXIPIME) 1 g in sodium chloride 0.9 % 100 mL IVPB  Status:  Discontinued        1 g 200 mL/hr over 30 Minutes Intravenous Every 24 hours 08/18/21 0254 08/19/21 1553   08/18/21 0115  ceFEPIme (MAXIPIME) 1 g in sodium chloride 0.9 % 100 mL IVPB        1 g 200  mL/hr over 30 Minutes Intravenous  Once 08/18/21 0111 08/18/21 0453          Subjective: C/o pain all over - better able to answer simple questions today  Objective: Vitals:   08/22/21 1046 08/22/21 1100 08/22/21 1120 08/22/21 1200  BP:  (!) 95/54  (!) 104/49  Pulse:  78 94 77  Resp:  (!) 23 (!) 25 (!) 22  Temp: 98.2 F (36.8 C)  97.9 F (36.6 C)   TempSrc: Oral  Axillary   SpO2:  100% 99% 99%  Weight:      Height:        Intake/Output Summary (Last 24 hours) at 08/22/2021 1408 Last data filed at 08/22/2021 1346 Gross per 24 hour  Intake 2844.83 ml  Output 2600 ml  Net 244.83 ml   Filed Weights   08/20/21 0700 08/21/21 0400 08/22/21 0018  Weight: 83.2 kg 82 kg 82.8 kg    Examination:  General: chronically ill appearing  Cardiovascular: RRR Lungs: unlabored Abdomen: S/nt/nd Neurological: disoriented, lethargic, easier to arouse and ask simple questions today - follows some simple commands annd answers simple questions - R eye with dysconjugate gaze (per discussion with brother, chronic) Extremities: No clubbing or cyanosis. No edema.   Data Reviewed: I have personally reviewed following labs and imaging studies  CBC: Recent Labs  Lab 08/17/21 1504 08/18/21 0301 08/19/21 0221 08/19/21 1811 08/20/21 0308 08/20/21 1250 08/20/21 2340 08/21/21 2010 08/22/21 0204  WBC 11.4* 11.7* 9.4  --  17.2*  --  16.7*  --  14.3*  NEUTROABS 7.7  --  8.1*  --  15.8*  --  13.9*  --   --   HGB 8.6* 8.5* 7.7*   < > 6.6* 7.1* 8.4*  8.4* 8.2* 8.0*  HCT 27.6* 27.1* 24.0*   < > 20.5* 22.5* 25.6*  25.3* 25.2* 24.7*  MCV 100.0 100.0 99.2  --  101.5*  --  93.8  --  93.2  PLT 131* 127* 120*  --  129*  --  93*  --  88*   < > = values in this interval not displayed.    Basic Metabolic Panel: Recent Labs  Lab 08/19/21 0221 08/19/21 2219 08/20/21 0308 08/20/21 2340 08/21/21 1122 08/21/21 2010 08/22/21 0204  NA 145  --  146* 148* 150* 149* 147*  K 4.4  --  4.8 3.7 4.0 3.2*  3.1*  CL 114*  --  112* 113* 117* 115* 114*  CO2 18*  --  17* 19* 17* 17* 20*  GLUCOSE 140*  --  111* 99 106* 177* 140*  BUN 71*  --  78* 73* 73* 69* 64*  CREATININE 5.72*   < > 6.35* 5.98* 5.75* 5.46* 5.28*  CALCIUM 9.0  --  8.3* 8.4* 8.3* 8.4* 8.7*  MG 1.8  --  1.6* 1.7  --  1.8  --   PHOS 4.5  --  4.2 4.3  --   --   --    < > = values in this interval not displayed.    GFR: Estimated Creatinine Clearance: 10 mL/min (Kanoelani Dobies) (by C-G formula based on SCr of 5.28 mg/dL (H)).  Liver Function Tests: Recent Labs  Lab 08/18/21 0301 08/19/21 0221 08/20/21 0308 08/20/21 2340  AST 20 14* 24 17  ALT 13 13 7 7   ALKPHOS 398* 357* 294* 269*  BILITOT 0.4 0.8 0.9 0.9  PROT 7.5 6.6 6.0* 6.0*  ALBUMIN 2.7* 2.4* 2.2* 2.2*    CBG: No results for input(s): GLUCAP in the last 168 hours.   Recent Results (from the past 240 hour(s))  Urine Culture     Status: Abnormal   Collection Time: 08/17/21 11:16 PM   Specimen: Urine, Catheterized  Result Value Ref Range Status   Specimen Description URINE, CATHETERIZED  Final   Special Requests   Final    NONE Performed at [facility], [address] [place], [place] [address]    Culture MULTIPLE SPECIES PRESENT, SUGGEST RECOLLECTION ([address])  Final   Report Status 08/18/2021 FINAL  Final  Resp Panel by RT-PCR (Flu Itzae Miralles&B, Covid) Nasopharyngeal Swab     Status: None   Collection Time: 08/17/21 11:19 PM   Specimen: Nasopharyngeal Swab; Nasopharyngeal(NP) swabs in vial transport medium  Result  Value Ref Range Status   SARS Coronavirus 2 by RT PCR NEGATIVE NEGATIVE Final    Comment: (NOTE) SARS-CoV-2 target nucleic acids are NOT DETECTED.  The SARS-CoV-2 RNA is generally detectable in upper respiratory specimens during the acute phase of infection. The lowest concentration of SARS-CoV-2 viral copies this assay can detect is 138 copies/mL. Jalynne Persico negative result does not preclude SARS-Cov-2 infection and should not be used as the sole basis for treatment  or other patient management decisions. Shawntrice Salle negative result may occur with  improper specimen collection/handling, submission of specimen other than nasopharyngeal swab, presence of viral mutation(s) within the areas targeted by this assay, and inadequate number of viral copies(<138 copies/mL). Autymn Omlor negative result must be combined with clinical observations, patient history, and epidemiological information. The expected result is Negative.  Fact Sheet for Patients:  EntrepreneurPulse.com.au  Fact Sheet for Healthcare Providers:  IncredibleEmployment.be  This test is no t yet approved or cleared by the [place] FDA and  has been authorized for detection and/or diagnosis of SARS-CoV-2 by FDA under an Emergency Use Authorization (EUA). This EUA will remain  in effect (meaning this test can be used) for the duration of the COVID-19 declaration under Section 564(b)(1) of the Act, 21 U.S.C.section 360bbb-3(b)(1), unless the authorization is terminated  or revoked sooner.       Influenza Yailin Biederman by PCR NEGATIVE NEGATIVE Final   Influenza B by PCR NEGATIVE NEGATIVE Final    Comment: (NOTE) The Xpert Xpress SARS-CoV-2/FLU/RSV plus assay is intended as an aid in the diagnosis of influenza from Nasopharyngeal swab specimens and should not be used as Taysen Bushart sole basis for treatment. Nasal washings and aspirates are unacceptable for Xpert Xpress SARS-CoV-2/FLU/RSV testing.  Fact Sheet for Patients: EntrepreneurPulse.com.au  Fact Sheet for Healthcare Providers: IncredibleEmployment.be  This test is not yet approved or cleared by the [place] FDA and has been authorized for detection and/or diagnosis of SARS-CoV-2 by FDA under an Emergency Use Authorization (EUA). This EUA will remain in effect (meaning this test can be used) for the duration of the COVID-19 declaration under Section 564(b)(1) of the Act, 21 U.S.C. section  360bbb-3(b)(1), unless the authorization is terminated or revoked.  Performed at [facility], [place] [address], [place], [place] [address]   Culture, blood (routine x 2)     Status: None (Preliminary result)   Collection Time: 08/18/21  2:55 AM   Specimen: BLOOD  Result Value Ref Range Status   Specimen Description BLOOD RIGHT ANTECUBITAL  Final   Special Requests   Final    BOTTLES DRAWN AEROBIC AND ANAEROBIC Blood Culture adequate volume   Culture   Final    NO GROWTH 4 DAYS Performed at [facility], [place] [address], [place], [place] [address]    Report Status PENDING  Incomplete  Culture, blood (routine x 2)     Status: None (Preliminary result)   Collection Time: 08/18/21  3:01 AM   Specimen: BLOOD  Result Value Ref Range Status   Specimen Description BLOOD BLOOD RIGHT WRIST  Final   Special Requests   Final    BOTTLES DRAWN AEROBIC AND ANAEROBIC Blood Culture adequate volume   Culture   Final    NO GROWTH 4 DAYS Performed at [facility], [place] [address], [place], [place] [address]    Report Status PENDING  Incomplete  MRSA Next Gen by PCR, Nasal     Status: Abnormal   Collection Time: 08/19/21  3:50 PM  Specimen: Nasal Mucosa; Nasal Swab  Result Value Ref Range Status   MRSA by PCR Next Gen DETECTED (Burlie Cajamarca) NOT DETECTED Final    Comment: RESULT CALLED TO, READ BACK BY AND VERIFIED WITH: K DUFFY RN 1855 08/19/21 Waverley Krempasky BROWNING (NOTE) The GeneXpert MRSA Assay (FDA approved for NASAL specimens only), is one component of Kina Shiffman comprehensive MRSA colonization surveillance program. It is not intended to diagnose MRSA infection nor to guide or monitor treatment for MRSA infections. Test performance is not FDA approved in patients less than 54 years old. Performed at [facility], [place] [address], [place], [place] [address]          Radiology Studies: DG Chest [place] 1 View  Result Date: 08/21/2021 CLINICAL DATA:  87 year old male with history of abnormal  respiration. EXAM: PORTABLE CHEST 1 VIEW COMPARISON:  Chest x-ray 08/20/2021. FINDINGS: Lung volumes are low. Diffuse interstitial prominence and patchy ill-defined and nodular opacities are noted throughout the lungs bilaterally. Small left pleural effusion. No definite right pleural effusion. No appreciable pneumothorax. Pulmonary vasculature does not appearing origin. Heart size is normal. The patient is rotated to the left on today's exam, resulting in distortion of the mediastinal contours and reduced diagnostic sensitivity and specificity for mediastinal pathology. Atherosclerotic calcifications in the thoracic aorta. IMPRESSION: 1. Severe multilobar bilateral bronchopneumonia again noted. 2. Small left pleural effusion. 3. Aortic atherosclerosis. Electronically Signed   By: Vinnie Langton M.D.   On: 08/21/2021 10:25   ECHOCARDIOGRAM COMPLETE  Result Date: 08/21/2021    ECHOCARDIOGRAM REPORT   Patient Name:   Anthony Dudley Date of Exam: 08/21/2021 Medical Rec #:  [id number]      Height:       69.0 in Accession #:    [id number]     Weight:       180.8 lb Date of Birth:  May 26, 1933      BSA:          1.979 m Patient Age:    87 years       BP:           122/78 mmHg Patient Gender: M              HR:           110 bpm. Exam Location:  Inpatient Procedure: 2D Echo, Color Doppler and Cardiac Doppler Indications:    Ventricular Tachycardia I47.2  History:        Patient has no prior history of Echocardiogram examinations.  Sonographer:    Bernadene Person RDCS Referring Phys: [id number] [place] Comments: Image acquisition challenging due to patient behavioral factors. and Image acquisition challenging due to uncooperative patient. IMPRESSIONS  1. Left ventricular ejection fraction, by estimation, is 55 to 60%. The left ventricle has normal function. The left ventricle has no regional wall motion abnormalities. There is mild left ventricular hypertrophy. Left ventricular diastolic parameters  are indeterminate.  2. Right ventricular systolic function was not well visualized. The right ventricular size is not well visualized. There is normal pulmonary artery systolic pressure.  3. The mitral valve is normal in structure. Trivial mitral valve regurgitation. No evidence of mitral stenosis.  4. The aortic valve was not well visualized. Aortic valve regurgitation is not visualized. Mild to moderate aortic valve sclerosis/calcification is present, without any evidence of aortic stenosis. FINDINGS  Left Ventricle: Left ventricular ejection fraction, by estimation, is 55 to 60%. The left ventricle has normal function. The left ventricle has no regional  wall motion abnormalities. The left ventricular internal cavity size was normal in size. There is  mild left ventricular hypertrophy. Left ventricular diastolic parameters are indeterminate. Right Ventricle: The right ventricular size is not well visualized. Right vetricular wall thickness was not well visualized. Right ventricular systolic function was not well visualized. There is normal pulmonary artery systolic pressure. The tricuspid regurgitant velocity is 2.36 m/s, and with an assumed right atrial pressure of 3 mmHg, the estimated right ventricular systolic pressure is 61.4 mmHg. Left Atrium: Left atrial size was normal in size. Right Atrium: Right atrial size was normal in size. Pericardium: There is no evidence of pericardial effusion. Mitral Valve: The mitral valve is normal in structure. Trivial mitral valve regurgitation. No evidence of mitral valve stenosis. Tricuspid Valve: The tricuspid valve is normal in structure. Tricuspid valve regurgitation is trivial. Aortic Valve: The aortic valve was not well visualized. Aortic valve regurgitation is not visualized. Mild to moderate aortic valve sclerosis/calcification is present, without any evidence of aortic stenosis. Pulmonic Valve: The pulmonic valve was not well visualized. Pulmonic valve regurgitation  is not visualized. Aorta: The aortic root and ascending aorta are structurally normal, with no evidence of dilitation. IAS/Shunts: The interatrial septum was not well visualized.  LEFT VENTRICLE PLAX 2D LVIDd:         3.60 cm LVIDs:         2.60 cm LV PW:         0.90 cm LV IVS:        1.10 cm LVOT diam:     2.20 cm LV SV:         71 LV SV Index:   36 LVOT Area:     3.80 cm  RIGHT VENTRICLE TAPSE (M-mode): 1.6 cm LEFT ATRIUM             Index        RIGHT ATRIUM           Index LA diam:        3.10 cm 1.57 cm/m   RA Area:     11.30 cm LA Vol (A2C):   30.6 ml 15.46 ml/m  RA Volume:   22.60 ml  11.42 ml/m LA Vol (A4C):   31.3 ml 15.81 ml/m LA Biplane Vol: 31.1 ml 15.71 ml/m  AORTIC VALVE LVOT Vmax:   111.00 cm/s LVOT Vmean:  71.000 cm/s LVOT VTI:    0.187 m  AORTA Ao Asc diam: 3.50 cm TRICUSPID VALVE TR Peak grad:   22.3 mmHg TR Vmax:        236.00 cm/s  SHUNTS Systemic VTI:  0.19 m Systemic Diam: 2.20 cm Mihai Croitoru MD Electronically signed by Sanda Klein MD Signature Date/Time: 08/21/2021/1:58:30 PM    Final         Scheduled Meds:  sodium chloride   Intravenous Once   sodium chloride   Intravenous Once   acetaminophen  1,000 mg Oral Q8H   Chlorhexidine Gluconate Cloth  6 each Topical Daily   Chlorhexidine Gluconate Cloth  6 each Topical Q0600   magnesium oxide  400 mg Oral BID   mupirocin ointment  1 application Nasal BID   oxybutynin  5 mg Oral QHS   vancomycin variable dose per unstable renal function (pharmacist dosing)   Does not apply See admin instructions   Continuous Infusions:  amiodarone 30 mg/hr (08/22/21 1000)   ceFEPime (MAXIPIME) IV     dextrose 100 mL/hr at 08/22/21 1121   lactated ringers  sodium chloride irrigation       LOS: 4 days    Time spent: over 30 min   Fayrene Helper, MD [facility]   To contact the attending provider between 7A-7P or the covering provider during after hours 7P-7A, please log into the web site [email or web address] and  access using universal [place] password for that web site. If you do not have the password, please call the hospital operator.  08/22/2021, 2:08 PM

## 2021-08-23 DIAGNOSIS — N183 Chronic kidney disease, stage 3 unspecified: Secondary | ICD-10-CM | POA: Diagnosis not present

## 2021-08-23 DIAGNOSIS — N179 Acute kidney failure, unspecified: Secondary | ICD-10-CM | POA: Diagnosis not present

## 2021-08-23 LAB — CBC WITH DIFFERENTIAL/PLATELET
Abs Immature Granulocytes: 0.1 10*3/uL — ABNORMAL HIGH (ref 0.00–0.07)
Basophils Absolute: 0 10*3/uL (ref 0.0–0.1)
Basophils Relative: 0 %
Eosinophils Absolute: 0.2 10*3/uL (ref 0.0–0.5)
Eosinophils Relative: 2 %
HCT: 25.2 % — ABNORMAL LOW (ref 39.0–52.0)
Hemoglobin: 8.1 g/dL — ABNORMAL LOW (ref 13.0–17.0)
Lymphocytes Relative: 18 %
Lymphs Abs: 2.2 10*3/uL (ref 0.7–4.0)
MCH: 30.8 pg (ref 26.0–34.0)
MCHC: 32.1 g/dL (ref 30.0–36.0)
MCV: 95.8 fL (ref 80.0–100.0)
Metamyelocytes Relative: 1 %
Monocytes Absolute: 0.1 10*3/uL (ref 0.1–1.0)
Monocytes Relative: 1 %
Neutro Abs: 9.4 10*3/uL — ABNORMAL HIGH (ref 1.7–7.7)
Neutrophils Relative %: 78 %
Platelets: 86 10*3/uL — ABNORMAL LOW (ref 150–400)
RBC: 2.63 MIL/uL — ABNORMAL LOW (ref 4.22–5.81)
RDW: 19.2 % — ABNORMAL HIGH (ref 11.5–15.5)
Smear Review: DECREASED
WBC: 12 10*3/uL — ABNORMAL HIGH (ref 4.0–10.5)
nRBC: 0 % (ref 0.0–0.2)

## 2021-08-23 LAB — COMPREHENSIVE METABOLIC PANEL
ALT: 6 U/L (ref 0–44)
AST: 15 U/L (ref 15–41)
Albumin: 2 g/dL — ABNORMAL LOW (ref 3.5–5.0)
Alkaline Phosphatase: 280 U/L — ABNORMAL HIGH (ref 38–126)
Anion gap: 11 (ref 5–15)
BUN: 54 mg/dL — ABNORMAL HIGH (ref 8–23)
CO2: 21 mmol/L — ABNORMAL LOW (ref 22–32)
Calcium: 8.7 mg/dL — ABNORMAL LOW (ref 8.9–10.3)
Chloride: 110 mmol/L (ref 98–111)
Creatinine, Ser: 4.34 mg/dL — ABNORMAL HIGH (ref 0.61–1.24)
GFR, Estimated: 13 mL/min — ABNORMAL LOW (ref >60)
Glucose, Bld: 110 mg/dL — ABNORMAL HIGH (ref 70–99)
Potassium: 3.6 mmol/L (ref 3.5–5.1)
Sodium: 142 mmol/L (ref 135–145)
Total Bilirubin: 0.7 mg/dL (ref 0.3–1.2)
Total Protein: 6.1 g/dL — ABNORMAL LOW (ref 6.5–8.1)

## 2021-08-23 LAB — CULTURE, BLOOD (ROUTINE X 2)
Culture: NO GROWTH
Culture: NO GROWTH
Special Requests: ADEQUATE
Special Requests: ADEQUATE

## 2021-08-23 LAB — MAGNESIUM: Magnesium: 1.6 mg/dL — ABNORMAL LOW (ref 1.7–2.4)

## 2021-08-23 LAB — PHOSPHORUS: Phosphorus: 3 mg/dL (ref 2.5–4.6)

## 2021-08-23 LAB — VANCOMYCIN, RANDOM: Vancomycin Rm: 22

## 2021-08-23 MED ORDER — POTASSIUM CHLORIDE 20 MEQ PO PACK
40.0000 meq | PACK | Freq: Once | ORAL | Status: AC
  Administered 2021-08-23: 40 meq via ORAL
  Filled 2021-08-23: qty 2

## 2021-08-23 MED ORDER — POTASSIUM CHLORIDE CRYS ER 20 MEQ PO TBCR: 40.0000 meq | EXTENDED_RELEASE_TABLET | Freq: Once | ORAL | Status: DC

## 2021-08-23 MED ORDER — VANCOMYCIN HCL IN DEXTROSE 1-5 GM/200ML-% IV SOLN
1000.0000 mg | Freq: Once | INTRAVENOUS | Status: AC
  Administered 2021-08-23: 1000 mg via INTRAVENOUS
  Filled 2021-08-23: qty 200

## 2021-08-23 NOTE — Progress Notes (Signed)
PROGRESS NOTE    Anthony Dudley  IRJ:[id number] DOB: January 04, 1933 DOA: 08/17/2021 PCP: Anthony Shivers, MD   Chief Complaint  Patient presents with   Acute Renal Failure   Abnormal Lab   Brief Narrative:  87 yo with hx of metastatic prostate cancer, chronic indwelling foley, CKD stage III, hx MDR UTI, dementia, chronic bedbound status, HFpEF, HTN and multiple other medical problems presenting with AKI on CKD from the nursing home.    Creatinine reportedly 2.5 on 8/26.   Cr reportedly 6.1 on 10/3.  He was recently transfused with Anthony Dudley HB of 6.6 at [facility] on 08/10/21.    He's been admitted with sepsis 2/2 UTI and AKI on CKD.    Urology consult pending due to imaging concerning for bilateral hydro.  Assessment & Plan:   Principal Problem:   Acute-on-chronic kidney injury ([place]) Active Problems:   Metabolic acidosis   Sepsis ([place])   UTI (urinary tract infection) due to urinary indwelling catheter (HCC)   Prostate cancer (HCC)  Goals of care Discussed with brother 10/5.  Pt full code, but he'd like to know if anything were to happen.  Discussed [name] not Anthony Dudley great dialysis candidate and his brother seems to understand this.  Will continue medical care, follow urology c/s. Brother not ready for palliative involvement or change in code status at this time.  Will continue to follow based on [name]'s clinical status.  SVT  Hypotension Appreciate cardiology and PCCM assistance Currently in sinus rhythm  Started on amiodarone  Appreciate cardiology recs (10/7) - amiodarone for now, consider IVF Echo with EF 55-60% (see report)  TSH wnl  Acute Hypoxic Respiratory Failure Severe multilobar bilateral bronchopneumonia noted Suspect compoent pulm edema with renal failure/hx HFpEF Hypotonic IVF at this time for hypernatremia Abx broadened to include vanc/cefepime Hopefully will have post obstructive diuresis after perc tubes placed - follow I/o ("since admit"  inaccurrate with CBI)  Acute Blood Loss Anemia  Acute on chronic anemia 2/2 IVF, blood loss from hematuria S/p 2 unit pRBC -> trend Blood in L>R nephrostomy tubes - hold further heparin  Hb 6.6 on 9/27, s/p 2 units pRBC 09/2019 hb 9.4 Labs c/w AOCD with iron def  Sepsis 2/2 CAUTI  CAP  Leukocytosis Tachypnea, tachycardia, leukocytosis and UA concerning for UTI - meets criteria for sepsis Prior cx with MDR proteus sensitive to cefepime Continue cefepime.  Vanc added with concern for pneumonia above.  Plan for 5-7 days abx (follow clinically - 5 days vanc, will plan for 7 days for UTI with cefepime). Follow blood (Ngx2) and urine cultures (multiple species) Now s/p perc nephrostomy tube placement UA with large LE, many bacteria, 21-50 RBC, >50 WBC Foley exchanged in ED Urology consulted - now s/p perc nephrostomy tubes  AKI on CKD III  Bilateral Hydronephrosis  Metabolic Acidosis  Hypernatremia Creatinine 2.5 on 8/26 reportedly 6.1 on 10/3 reportedly Creatinine improving after nephrostomy tube placement CT with moderate bilateral hydro Renal US with bilateral hydro L>R Start hypotonic IVF, follow sodium - continued improvement Urology c/s, appreciate recs  S/p bilateral nephrostomy tubes urology to discuss antegrade placement of ureteral stents once stable for that Will follow repeat labs  Hx of Prostate Cancer Diffuse rib sclerosis c/w metastatic disease Urology c/s - degarelix per urology PSA elevated - likely signifies widely metastatic cancer per urology, follow testosterone (39) Consider bone scan when stablized Will need treatment escalation pending further staging studies  Urethral Erosion  Urinary Retention Foley for  now, per urology D/c flomax per urology  Hematuria  Due to procedure, irritation of local tumor in bladder CBI per urology -> resolved from bladder  HFpEF Fluids, volume as above  Thrombocytopenia Mild downtrend, will continue to monitor -  likely in setting of infection above  DVT prophylaxis: SCD Code Status: full Family Communication: none at bedside - brother over phone 10/9 Disposition:   Status is: Inpatient  Remains inpatient appropriate because:Inpatient level of care appropriate due to severity of illness  Dispo: The patient is from: Home              Anticipated d/c is to: Home              Patient currently is not medically stable to d/c.   Difficult to place patient No  Consultants:  urology  Procedures:  none  Antimicrobials:  Anti-infectives (From admission, onward)    Start     Dose/Rate Route Frequency Ordered Stop   08/22/21 1800  ceFEPIme (MAXIPIME) 2 g in sodium chloride 0.9 % 100 mL IVPB        2 g 200 mL/hr over 30 Minutes Intravenous Every 24 hours 08/22/21 0727     08/21/21 0830  vancomycin (VANCOREADY) IVPB 1250 mg/250 mL        1,250 mg 166.7 mL/hr over 90 Minutes Intravenous  Once 08/21/21 0733 08/21/21 1842   08/20/21 1800  ceFEPIme (MAXIPIME) 1 g in sodium chloride 0.9 % 100 mL IVPB  Status:  Discontinued        1 g 200 mL/hr over 30 Minutes Intravenous Every 24 hours 08/20/21 1457 08/22/21 0727   08/19/21 1800  ceFEPIme (MAXIPIME) 2 g in sodium chloride 0.9 % 100 mL IVPB  Status:  Discontinued        2 g 200 mL/hr over 30 Minutes Intravenous Every 24 hours 08/19/21 1553 08/20/21 1457   08/19/21 1645  vancomycin (VANCOREADY) IVPB 1500 mg/300 mL        1,500 mg 150 mL/hr over 120 Minutes Intravenous  Once 08/19/21 1551 08/19/21 1911   08/19/21 1551  vancomycin variable dose per unstable renal function (pharmacist dosing)         Does not apply See admin instructions 08/19/21 1551     08/19/21 1545  ceFAZolin (ANCEF) IVPB 2g/100 mL premix        2 g 200 mL/hr over 30 Minutes Intravenous To Radiology 08/19/21 1452 08/19/21 1655   08/19/21 0000  ceFEPIme (MAXIPIME) 1 g in sodium chloride 0.9 % 100 mL IVPB  Status:  Discontinued        1 g 200 mL/hr over 30 Minutes Intravenous Every  24 hours 08/18/21 0254 08/19/21 1553   08/18/21 0115  ceFEPIme (MAXIPIME) 1 g in sodium chloride 0.9 % 100 mL IVPB        1 g 200 mL/hr over 30 Minutes Intravenous  Once 08/18/21 0111 08/18/21 0453          Subjective: No complaints today Confused as to why he's here  Objective: Vitals:   08/22/21 2345 08/23/21 0404 08/23/21 0405 08/23/21 0800  BP: 122/69 110/62 (!) 108/58   Pulse: 83 76 84   Resp: (!) 24 (!) 26 (!) 22   Temp: 98.1 F (36.7 C)  98.2 F (36.8 C) 98.5 F (36.9 C)  TempSrc: Oral  Oral Oral  SpO2: 100% 100% 99%   Weight: 84.8 kg     Height:        Intake/Output Summary (  Last 24 hours) at 08/23/2021 0927 Last data filed at 08/23/2021 0803 Gross per 24 hour  Intake 2670.45 ml  Output 2040 ml  Net 630.45 ml   Filed Weights   08/21/21 0400 08/22/21 0018 08/22/21 2345  Weight: 82 kg 82.8 kg 84.8 kg    Examination:  General: No acute distress. Cardiovascular: RRR Lungs: unlabored Abdomen: Soft, nontender, nondistended Neurological: confused as to why he's here, but pleasant, no focal deficits appreciated Skin: Warm and dry. No rashes or lesions. Extremities: No clubbing or cyanosis. No edema.  Data Reviewed: I have personally reviewed following labs and imaging studies  CBC: Recent Labs  Lab 08/17/21 1504 08/18/21 0301 08/19/21 0221 08/19/21 1811 08/20/21 0308 08/20/21 1250 08/20/21 2340 08/21/21 2010 08/22/21 0204 08/22/21 1641 08/23/21 0313  WBC 11.4*   < > 9.4  --  17.2*  --  16.7*  --  14.3*  --  12.0*  NEUTROABS 7.7  --  8.1*  --  15.8*  --  13.9*  --   --   --  9.4*  HGB 8.6*   < > 7.7*   < > 6.6*   < > 8.4*  8.4* 8.2* 8.0* 8.5* 8.1*  HCT 27.6*   < > 24.0*   < > 20.5*   < > 25.6*  25.3* 25.2* 24.7* 26.3* 25.2*  MCV 100.0   < > 99.2  --  101.5*  --  93.8  --  93.2  --  95.8  PLT 131*   < > 120*  --  129*  --  93*  --  88*  --  86*   < > = values in this interval not displayed.    Basic Metabolic Panel: Recent Labs  Lab  08/19/21 0221 08/19/21 2219 08/20/21 0308 08/20/21 2340 08/21/21 1122 08/21/21 2010 08/22/21 0204 08/22/21 1641 08/23/21 0313  NA 145  --  146* 148* 150* 149* 147* 144 142  K 4.4  --  4.8 3.7 4.0 3.2* 3.1* 3.5 3.6  CL 114*  --  112* 113* 117* 115* 114* 114* 110  CO2 18*  --  17* 19* 17* 17* 20* 17* 21*  GLUCOSE 140*  --  111* 99 106* 177* 140* 132* 110*  BUN 71*  --  78* 73* 73* 69* 64* 58* 54*  CREATININE 5.72*   < > 6.35* 5.98* 5.75* 5.46* 5.28* 4.65* 4.34*  CALCIUM 9.0  --  8.3* 8.4* 8.3* 8.4* 8.7* 8.5* 8.7*  MG 1.8  --  1.6* 1.7  --  1.8  --   --  1.6*  PHOS 4.5  --  4.2 4.3  --   --   --   --  3.0   < > = values in this interval not displayed.    GFR: Estimated Creatinine Clearance: 12.2 mL/min (Delrose Rohwer) (by C-G formula based on SCr of 4.34 mg/dL (H)).  Liver Function Tests: Recent Labs  Lab 08/18/21 0301 08/19/21 0221 08/20/21 0308 08/20/21 2340 08/23/21 0313  AST 20 14* 24 17 15   ALT 13 13 7 7 6   ALKPHOS 398* 357* 294* 269* 280*  BILITOT 0.4 0.8 0.9 0.9 0.7  PROT 7.5 6.6 6.0* 6.0* 6.1*  ALBUMIN 2.7* 2.4* 2.2* 2.2* 2.0*    CBG: No results for input(s): GLUCAP in the last 168 hours.   Recent Results (from the past 240 hour(s))  Urine Culture     Status: Abnormal   Collection Time: 08/17/21 11:16 PM   Specimen: Urine, Catheterized  Result  Value Ref Range Status   Specimen Description URINE, CATHETERIZED  Final   Special Requests   Final    NONE Performed at [facility], [place] [address], [place], [place] [address]    Culture MULTIPLE SPECIES PRESENT, SUGGEST RECOLLECTION (Ila Landowski)  Final   Report Status 08/18/2021 FINAL  Final  Resp Panel by RT-PCR (Flu [place] Bohac&B, Covid) Nasopharyngeal Swab     Status: None   Collection Time: 08/17/21 11:19 PM   Specimen: Nasopharyngeal Swab; Nasopharyngeal(NP) swabs in vial transport medium  Result Value Ref Range Status   SARS Coronavirus 2 by RT PCR NEGATIVE NEGATIVE Final    Comment: (NOTE) SARS-CoV-2 target nucleic acids  are NOT DETECTED.  The SARS-CoV-2 RNA is generally detectable in upper respiratory specimens during the acute phase of infection. The lowest concentration of SARS-CoV-2 viral copies this assay can detect is 138 copies/mL. Nohemi Nicklaus negative result does not preclude SARS-Cov-2 infection and should not be used as the sole basis for treatment or other patient management decisions. Tyshia Fenter negative result may occur with  improper specimen collection/handling, submission of specimen other than nasopharyngeal swab, presence of viral mutation(s) within the areas targeted by this assay, and inadequate number of viral copies(<138 copies/mL). Ruble Buttler negative result must be combined with clinical observations, patient history, and epidemiological information. The expected result is Negative.  Fact Sheet for Patients:  EntrepreneurPulse.com.au  Fact Sheet for Healthcare Providers:  IncredibleEmployment.be  This test is no t yet approved or cleared by the [place] FDA and  has been authorized for detection and/or diagnosis of SARS-CoV-2 by FDA under an Emergency Use Authorization (EUA). This EUA will remain  in effect (meaning this test can be used) for the duration of the COVID-19 declaration under Section 564(b)(1) of the Act, 21 U.S.C.section 360bbb-3(b)(1), unless the authorization is terminated  or revoked sooner.       Influenza Roch Quach by PCR NEGATIVE NEGATIVE Final   Influenza B by PCR NEGATIVE NEGATIVE Final    Comment: (NOTE) The Xpert Xpress SARS-CoV-2/FLU/RSV plus assay is intended as an aid in the diagnosis of influenza from Nasopharyngeal swab specimens and should not be used as Teegan Guinther sole basis for treatment. Nasal washings and aspirates are unacceptable for Xpert Xpress SARS-CoV-2/FLU/RSV testing.  Fact Sheet for Patients: EntrepreneurPulse.com.au  Fact Sheet for Healthcare Providers: IncredibleEmployment.be  This test is  not yet approved or cleared by the [place] FDA and has been authorized for detection and/or diagnosis of SARS-CoV-2 by FDA under an Emergency Use Authorization (EUA). This EUA will remain in effect (meaning this test can be used) for the duration of the COVID-19 declaration under Section 564(b)(1) of the Act, 21 U.S.C. section 360bbb-3(b)(1), unless the authorization is terminated or revoked.  Performed at [facility], [place] [address], [place], [place] [address]   Culture, blood (routine x 2)     Status: None   Collection Time: 08/18/21  2:55 AM   Specimen: BLOOD  Result Value Ref Range Status   Specimen Description BLOOD RIGHT ANTECUBITAL  Final   Special Requests   Final    BOTTLES DRAWN AEROBIC AND ANAEROBIC Blood Culture adequate volume   Culture   Final    NO GROWTH 5 DAYS Performed at [facility], [place] [address], [place], [place] [address]    Report Status 08/23/2021 FINAL  Final  Culture, blood (routine x 2)     Status: None   Collection Time: 08/18/21  3:01 AM   Specimen: BLOOD  Result Value Ref Range Status   Specimen Description BLOOD BLOOD RIGHT WRIST  Final   Special Requests   Final    BOTTLES DRAWN AEROBIC AND ANAEROBIC Blood Culture adequate volume   Culture   Final    NO GROWTH 5 DAYS Performed at [facility], [address] [place], [place] [address]    Report Status 08/23/2021 FINAL  Final  MRSA Next Gen by PCR, Nasal     Status: Abnormal   Collection Time: 08/19/21  3:50 PM   Specimen: Nasal Mucosa; Nasal Swab  Result Value Ref Range Status   MRSA by PCR Next Gen DETECTED (Jatasia Gundrum) NOT DETECTED Final    Comment: RESULT CALLED TO, READ BACK BY AND VERIFIED WITH: K DUFFY RN 1855 08/19/21 Jaqua Ching BROWNING (NOTE) The GeneXpert MRSA Assay (FDA approved for NASAL specimens only), is one component of Vola Beneke comprehensive MRSA colonization surveillance program. It is not intended to diagnose MRSA infection nor to guide or monitor treatment for MRSA  infections. Test performance is not FDA approved in patients less than 25 years old. Performed at [facility], [place] [address], [place], [place] [address]          Radiology Studies: ECHOCARDIOGRAM COMPLETE  Result Date: 08/21/2021    ECHOCARDIOGRAM REPORT   Patient Name:   ALPHEUS STIFF Date of Exam: 08/21/2021 Medical Rec #:  [id number]      Height:       69.0 in Accession #:    [id number]     Weight:       180.8 lb Date of Birth:  1933-09-04      BSA:          1.979 m Patient Age:    87 years       BP:           122/78 mmHg Patient Gender: M              HR:           110 bpm. Exam Location:  Inpatient Procedure: 2D Echo, Color Doppler and Cardiac Doppler Indications:    Ventricular Tachycardia I47.2  History:        Patient has no prior history of Echocardiogram examinations.  Sonographer:    Bernadene Person RDCS Referring Phys: [id number] [place] Comments: Image acquisition challenging due to patient behavioral factors. and Image acquisition challenging due to uncooperative patient. IMPRESSIONS  1. Left ventricular ejection fraction, by estimation, is 55 to 60%. The left ventricle has normal function. The left ventricle has no regional wall motion abnormalities. There is mild left ventricular hypertrophy. Left ventricular diastolic parameters are indeterminate.  2. Right ventricular systolic function was not well visualized. The right ventricular size is not well visualized. There is normal pulmonary artery systolic pressure.  3. The mitral valve is normal in structure. Trivial mitral valve regurgitation. No evidence of mitral stenosis.  4. The aortic valve was not well visualized. Aortic valve regurgitation is not visualized. Mild to moderate aortic valve sclerosis/calcification is present, without any evidence of aortic stenosis. FINDINGS  Left Ventricle: Left ventricular ejection fraction, by estimation, is 55 to 60%. The left ventricle has normal function. The left  ventricle has no regional wall motion abnormalities. The left ventricular internal cavity size was normal in size. There is  mild left ventricular hypertrophy. Left ventricular diastolic parameters are indeterminate. Right Ventricle: The right ventricular size is not well visualized. Right vetricular wall thickness was not well  visualized. Right ventricular systolic function was not well visualized. There is normal pulmonary artery systolic pressure. The tricuspid regurgitant velocity is 2.36 m/s, and with an assumed right atrial pressure of 3 mmHg, the estimated right ventricular systolic pressure is 87.8 mmHg. Left Atrium: Left atrial size was normal in size. Right Atrium: Right atrial size was normal in size. Pericardium: There is no evidence of pericardial effusion. Mitral Valve: The mitral valve is normal in structure. Trivial mitral valve regurgitation. No evidence of mitral valve stenosis. Tricuspid Valve: The tricuspid valve is normal in structure. Tricuspid valve regurgitation is trivial. Aortic Valve: The aortic valve was not well visualized. Aortic valve regurgitation is not visualized. Mild to moderate aortic valve sclerosis/calcification is present, without any evidence of aortic stenosis. Pulmonic Valve: The pulmonic valve was not well visualized. Pulmonic valve regurgitation is not visualized. Aorta: The aortic root and ascending aorta are structurally normal, with no evidence of dilitation. IAS/Shunts: The interatrial septum was not well visualized.  LEFT VENTRICLE PLAX 2D LVIDd:         3.60 cm LVIDs:         2.60 cm LV PW:         0.90 cm LV IVS:        1.10 cm LVOT diam:     2.20 cm LV SV:         71 LV SV Index:   36 LVOT Area:     3.80 cm  RIGHT VENTRICLE TAPSE (M-mode): 1.6 cm LEFT ATRIUM             Index        RIGHT ATRIUM           Index LA diam:        3.10 cm 1.57 cm/m   RA Area:     11.30 cm LA Vol (A2C):   30.6 ml 15.46 ml/m  RA Volume:   22.60 ml  11.42 ml/m LA Vol (A4C):   31.3  ml 15.81 ml/m LA Biplane Vol: 31.1 ml 15.71 ml/m  AORTIC VALVE LVOT Vmax:   111.00 cm/s LVOT Vmean:  71.000 cm/s LVOT VTI:    0.187 m  AORTA Ao Asc diam: 3.50 cm TRICUSPID VALVE TR Peak grad:   22.3 mmHg TR Vmax:        236.00 cm/s  SHUNTS Systemic VTI:  0.19 m Systemic Diam: 2.20 cm Mihai Croitoru MD Electronically signed by Sanda Klein MD Signature Date/Time: 08/21/2021/1:58:30 PM    Final         Scheduled Meds:  sodium chloride   Intravenous Once   sodium chloride   Intravenous Once   acetaminophen  1,000 mg Oral Q8H   Chlorhexidine Gluconate Cloth  6 each Topical Daily   Chlorhexidine Gluconate Cloth  6 each Topical Q0600   magnesium oxide  400 mg Oral BID   mupirocin ointment  1 application Nasal BID   oxybutynin  5 mg Oral QHS   vancomycin variable dose per unstable renal function (pharmacist dosing)   Does not apply See admin instructions   Continuous Infusions:  amiodarone 30 mg/hr (08/23/21 0203)   ceFEPime (MAXIPIME) IV 2 g (08/22/21 1821)   dextrose 100 mL/hr at 08/22/21 2317   lactated ringers     sodium chloride irrigation       LOS: 5 days    Time spent: over 30 min   Fayrene Helper, MD [facility]   To contact the attending provider between 7A-7P or the covering  provider during after hours 7P-7A, please log into the web site [email or web address] and access using universal [place] password for that web site. If you do not have the password, please call the hospital operator.  08/23/2021, 9:27 AM

## 2021-08-23 NOTE — Care Management Important Message (Signed)
Important Message  Patient Details  Name: Julies Carmickle MRN: [id number] Date of Birth: 1933-02-11   Medicare Important Message Given:  Yes     Shelda Altes 08/23/2021, 12:55 PM

## 2021-08-23 NOTE — Progress Notes (Signed)
Patient ID: Anthony Dudley, male   DOB: 1933/02/11, 87 y.o.   MRN: [id number]  5 Days Post-Op Subjective: No new changes over the weekend.  Objective: Vital signs in last 24 hours: Temp:  [97.6 F (36.4 C)-98.5 F (36.9 C)] 98.2 F (36.8 C) (10/10 0405) Pulse Rate:  [76-98] 84 (10/10 0405) Resp:  [20-28] 22 (10/10 0405) BP: (95-122)/(49-72) 108/58 (10/10 0405) SpO2:  [98 %-100 %] 99 % (10/10 0405) Weight:  [84.8 kg] 84.8 kg (10/09 2345)  Intake/Output from previous day: 10/09 0701 - 10/10 0700 In: 3229.8 [P.O.:560; I.V.:2669.8] Out: 1715 [Urine:1715] Intake/Output this shift: Total I/O In: 968.3 [P.O.:200; I.V.:768.3] Out: 475 [Urine:475]  Physical Exam:  General: Alert and oriented GU: Bilateral PCNs draining well  Lab Results: Recent Labs    08/22/21 0204 08/22/21 1641 08/23/21 0313  HGB 8.0* 8.5* 8.1*  HCT 24.7* 26.3* 25.2*   CBC Latest Ref Rng & Units 08/23/2021 08/22/2021 08/22/2021  WBC 4.0 - 10.5 K/uL 12.0(H) - 14.3(H)  Hemoglobin 13.0 - 17.0 g/dL 8.1(L) 8.5(L) 8.0(L)  Hematocrit 39.0 - 52.0 % 25.2(L) 26.3(L) 24.7(L)  Platelets 150 - 400 K/uL 86(L) - 88(L)     BMET Recent Labs    08/22/21 1641 08/23/21 0313  NA 144 142  K 3.5 3.6  CL 114* 110  CO2 17* 21*  GLUCOSE 132* 110*  BUN 58* 54*  CREATININE 4.65* 4.34*  CALCIUM 8.5* 8.7*     Studies/Results: ECHOCARDIOGRAM COMPLETE  Result Date: 08/21/2021    ECHOCARDIOGRAM REPORT   Patient Name:   Anthony Dudley Date of Exam: 08/21/2021 Medical Rec #:  [id number]      Height:       69.0 in Accession #:    [id number]     Weight:       180.8 lb Date of Birth:  02-24-1933      BSA:          1.979 m Patient Age:    33 years       BP:           122/78 mmHg Patient Gender: M              HR:           110 bpm. Exam Location:  Inpatient Procedure: 2D Echo, Color Doppler and Cardiac Doppler Indications:    Ventricular Tachycardia I47.2  History:        Patient has no prior history of Echocardiogram examinations.   Sonographer:    Bernadene Person RDCS Referring Phys: [id number] [place] Comments: Image acquisition challenging due to patient behavioral factors. and Image acquisition challenging due to uncooperative patient. IMPRESSIONS  1. Left ventricular ejection fraction, by estimation, is 55 to 60%. The left ventricle has normal function. The left ventricle has no regional wall motion abnormalities. There is mild left ventricular hypertrophy. Left ventricular diastolic parameters are indeterminate.  2. Right ventricular systolic function was not well visualized. The right ventricular size is not well visualized. There is normal pulmonary artery systolic pressure.  3. The mitral valve is normal in structure. Trivial mitral valve regurgitation. No evidence of mitral stenosis.  4. The aortic valve was not well visualized. Aortic valve regurgitation is not visualized. Mild to moderate aortic valve sclerosis/calcification is present, without any evidence of aortic stenosis. FINDINGS  Left Ventricle: Left ventricular ejection fraction, by estimation, is 55 to 60%. The left ventricle has normal function. The left ventricle has no regional wall motion abnormalities. The left ventricular  internal cavity size was normal in size. There is  mild left ventricular hypertrophy. Left ventricular diastolic parameters are indeterminate. Right Ventricle: The right ventricular size is not well visualized. Right vetricular wall thickness was not well visualized. Right ventricular systolic function was not well visualized. There is normal pulmonary artery systolic pressure. The tricuspid regurgitant velocity is 2.36 m/s, and with an assumed right atrial pressure of 3 mmHg, the estimated right ventricular systolic pressure is 37.1 mmHg. Left Atrium: Left atrial size was normal in size. Right Atrium: Right atrial size was normal in size. Pericardium: There is no evidence of pericardial effusion. Mitral Valve: The mitral valve is  normal in structure. Trivial mitral valve regurgitation. No evidence of mitral valve stenosis. Tricuspid Valve: The tricuspid valve is normal in structure. Tricuspid valve regurgitation is trivial. Aortic Valve: The aortic valve was not well visualized. Aortic valve regurgitation is not visualized. Mild to moderate aortic valve sclerosis/calcification is present, without any evidence of aortic stenosis. Pulmonic Valve: The pulmonic valve was not well visualized. Pulmonic valve regurgitation is not visualized. Aorta: The aortic root and ascending aorta are structurally normal, with no evidence of dilitation. IAS/Shunts: The interatrial septum was not well visualized.  LEFT VENTRICLE PLAX 2D LVIDd:         3.60 cm LVIDs:         2.60 cm LV PW:         0.90 cm LV IVS:        1.10 cm LVOT diam:     2.20 cm LV SV:         71 LV SV Index:   36 LVOT Area:     3.80 cm  RIGHT VENTRICLE TAPSE (M-mode): 1.6 cm LEFT ATRIUM             Index        RIGHT ATRIUM           Index LA diam:        3.10 cm 1.57 cm/m   RA Area:     11.30 cm LA Vol (A2C):   30.6 ml 15.46 ml/m  RA Volume:   22.60 ml  11.42 ml/m LA Vol (A4C):   31.3 ml 15.81 ml/m LA Biplane Vol: 31.1 ml 15.71 ml/m  AORTIC VALVE LVOT Vmax:   111.00 cm/s LVOT Vmean:  71.000 cm/s LVOT VTI:    0.187 m  AORTA Ao Asc diam: 3.50 cm TRICUSPID VALVE TR Peak grad:   22.3 mmHg TR Vmax:        236.00 cm/s  SHUNTS Systemic VTI:  0.19 m Systemic Diam: 2.20 cm Dani Gobble Croitoru MD Electronically signed by Sanda Klein MD Signature Date/Time: 08/21/2021/1:58:30 PM    Final     Assessment/Plan: Bilateral hydronephrosis/AKI/sepsis: Continue antibiotic therapy/supportive care.  Cultures have not been helpful but would treat empirically for pyelonephritis. S/P bilateral nephrostomy tube placement.  Renal function now improving.  Will ask IR to consider antegrade placement of ureteral stents once he is felt to be stable enough for that.  Metastatic prostate cancer: PSA significantly  elevated consistent with probable widespread metastatic prostate cancer. CT scan without lymphadenopathy but with extensive sclerotic bone metastases.  Will consider bone scan once acute issues stabilized to further assess extent of disease.  Will need treatment escalation eventually with androgen receptor blockade/androgen synthesis inhibition as outpatient.  Started ADT with degarelix on 10/6. Urethral erosion/urinary retention:  Continue Foley for now.  Will consider SP tube placement eventually (likely at time of next  stent change when he gets general anesthesia).  Will likely have minimal output from urethral catheter with nephrostomy tubes in place until antegrade stents placed.   LOS: 5 days   Dutch Gray 08/23/2021, 7:00 AM

## 2021-08-23 NOTE — Progress Notes (Signed)
Speech Language Pathology Treatment: Dysphagia  Patient Details Name: Anthony Dudley MRN: [id number] DOB: [birth year] Today's Date: 08/23/2021 Time: 2023-3435 SLP Time Calculation (min) (ACUTE ONLY): 14 min  Assessment / Plan / Recommendation Clinical Impression  Pt demonstrates improved arousal today but continues to be mildly agitated and restless, perseverating on mitts. SLP removed mitts during session and was able to encourage pt to take a few sips of water with no coughing today, but pt would not let SLP put in his dentures to attempt upgrades solids. Pt would not accept sufficient trials of thins to determine if improved mentation indicated readiness for upgrade. Pt to continue purees and honey thick liquids, SLP will f/u for further attempts.    HPI HPI: 87yo male admitted from nursing home 08/17/21 with worsening kidney function. PMH: prostate cancer, CKD3, chronic indwelling Foley catheter, MDR UTI, dementia, bedbound at baseline, dCHF, HTN, HLD, Cvid (04/2019). CXR = no acute airspace disease      SLP Plan  Continue with current plan of care      Recommendations for follow up therapy are one component of a multi-disciplinary discharge planning process, led by the attending physician.  Recommendations may be updated based on patient status, additional functional criteria and insurance authorization.    Recommendations  Diet recommendations: Dysphagia 1 (puree);Honey-thick liquid Liquids provided via: Cup;Straw Medication Administration: Crushed with puree Supervision: Full supervision/cueing for compensatory strategies;Trained caregiver to feed patient Compensations: Slow rate;Small sips/bites Postural Changes and/or Swallow Maneuvers: Seated upright 90 degrees                Oral Care Recommendations: Oral care BID Follow up Recommendations: 24 hour supervision/assistance;Skilled Nursing facility SLP Visit Diagnosis: Dysphagia, unspecified (R13.10) Plan: Continue with  current plan of care       GO                Lida Berkery, Katherene Ponto  08/23/2021, 12:06 PM

## 2021-08-23 NOTE — Progress Notes (Signed)
Referring Physician(s): * No referring provider recorded for this case *  Supervising Physician: Sandi Mariscal  Patient Status:  Anthony Dudley - In-pt  Chief Complaint:  History of prostate cancer with chronic indwelling Foley, CKD, UTI, dementia, hypertension presented with urosepsis.  Patient found to have AKI on CKD with bilateral hydronephrosis.  Urology was unable to place retrograde ureteral stent. IR placed bilateral nephrostomy tubes for hydronephrosis 08/19/2021.  Subjective: Patient observed to be resting in bed. Anthony Dudley is watching tv but does not respond when questioned.   Due to history of dementia patient is poor historian.   -Right nephrostomy tube insertion site unremarkable with sutures and StatLock in place.  No redness, drainage or other signs of infection noted.  Right JP drain has 450 mLclear, yellow urine with 1100 mL documented in epic for the past 24 hours. Dressing has small amount of dried blood. -Left nephrostomy tube insertion site unremarkable with sutures and StatLock in place.  No redness, drainage or other signs of infection noted.  Left JP drain has approximately 250 mL red-colored urine with 595 mL documented in epic for the past 24 hours.  Dressing has small amount of dried blood.  WBC 12.0  Afebrile  Creat 4.34  Anthony Dudley is in no distress. Patient's vital signs are stable.     Allergies: Solanum lycopersicum [tomato]  Medications: Prior to Admission medications   Medication Sig Start Date End Date Taking? Authorizing Provider  Cholecalciferol (VITAMIN D-3) 125 MCG (5000 UT) TABS Take 5,000 Units by mouth daily.   Yes [provider]  ipratropium-albuterol (DUONEB) 0.5-2.5 (3) MG/3ML SOLN Take 3 mLs by nebulization every 4 (four) hours as needed (shortness of breath).   Yes [provider]  metoprolol tartrate (LOPRESSOR) 25 MG tablet Take 12.5 mg by mouth 2 (two) times daily.   Yes [provider]  Multiple Vitamins-Iron  (MULTI-VITAMIN/IRON) TABS Take 1 tablet by mouth in the morning and at bedtime.   Yes [provider]  niacinamide 500 MG tablet Take 500 mg by mouth daily.   Yes [provider]  Nutritional Supplements (BOOST BREEZE PO) Take 1 Bottle by mouth 3 (three) times daily.   Yes [provider]  OXYGEN Inhale into the lungs See admin instructions. Put on oxygen if O2 is less than 90% every shift   Yes [provider]  sodium bicarbonate 650 MG tablet Take 650 mg by mouth daily.   Yes [provider]  tamsulosin (FLOMAX) 0.4 MG CAPS capsule Take 0.4 mg by mouth daily.   Yes [provider]  tolterodine (DETROL) 1 MG tablet Take 1 mg by mouth 2 (two) times daily.   Yes [provider]  vitamin B-12 (CYANOCOBALAMIN) 1000 MCG tablet Take 2,000 mcg by mouth daily.   Yes [provider]     Vital Signs: BP (!) 108/58   Pulse 84   Temp 98.5 F (36.9 C) (Oral)   Resp (!) 22   Ht 5\' 9"  (1.753 m) Comment: unable to stand, approximate measurement lying in bed.  Wt 186 lb 15.2 oz (84.8 kg)   SpO2 99%   BMI 27.61 kg/m   Physical Exam Constitutional:      Appearance: Anthony Dudley is ill-appearing.  HENT:     Head: Normocephalic and atraumatic.  Cardiovascular:     Rate and Rhythm: Normal rate.  Pulmonary:     Effort: Pulmonary effort is normal.  Genitourinary:    Comments: Foley catheter in place Bilateral nephrostomy tube  sites unremarkable, C/D/I Skin:    General: Skin is warm and dry.  Neurological:     Mental Status: Anthony Dudley is alert. Anthony Dudley is disoriented.    Imaging: DG Chest Port 1 View  Result Date: 08/21/2021 CLINICAL DATA:  87 year old male with history of abnormal respiration. EXAM: PORTABLE CHEST 1 VIEW COMPARISON:  Chest x-ray 08/20/2021. FINDINGS: Lung volumes are low. Diffuse interstitial prominence and patchy ill-defined and nodular opacities are noted throughout the lungs bilaterally. Small left pleural effusion. No  definite right pleural effusion. No appreciable pneumothorax. Pulmonary vasculature does not appearing origin. Heart size is normal. The patient is rotated to the left on today's exam, resulting in distortion of the mediastinal contours and reduced diagnostic sensitivity and specificity for mediastinal pathology. Atherosclerotic calcifications in the thoracic aorta. IMPRESSION: 1. Severe multilobar bilateral bronchopneumonia again noted. 2. Small left pleural effusion. 3. Aortic atherosclerosis. Electronically Signed   By: Vinnie Langton M.D.   On: 08/21/2021 10:25   DG CHEST PORT 1 VIEW  Result Date: 08/20/2021 CLINICAL DATA:  Hypoxia, respiratory failure, confusion. EXAM: PORTABLE CHEST 1 VIEW COMPARISON:  Chest radiograph 08/19/2021, CT abdomen pelvis 08/19/2021 FINDINGS: Diffuse hazy interstitial thickening and prominence of the perihilar vasculature. Unchanged retrocardiac left lower lobe opacity. No pneumothorax. Stable cardiomediastinal silhouette. Aortic calcifications. Numerous sclerotic osseous lesions, particularly within thoracolumbar spine, better appreciated on yesterday's cross-sectional imaging. IMPRESSION: Mild pulmonary edema. Retrocardiac left lower lobe opacity may represent combination of pleural fluid and atelectasis, though pneumonia is not excluded. Electronically Signed   By: Ileana Roup M.D.   On: 08/20/2021 09:32   ECHOCARDIOGRAM COMPLETE  Result Date: 08/21/2021    ECHOCARDIOGRAM REPORT   Patient Name:   Anthony Dudley Date of Exam: 08/21/2021 Medical Rec #:  [id number]      Height:       69.0 in Accession #:    [id number]     Weight:       180.8 lb Date of Birth:  Dec 22, 1932      BSA:          1.979 m Patient Age:    87 years       BP:           122/78 mmHg Patient Gender: M              HR:           110 bpm. Exam Location:  Inpatient Procedure: 2D Echo, Color Doppler and Cardiac Doppler Indications:    Ventricular Tachycardia I47.2  History:        Patient has no prior history  of Echocardiogram examinations.  Sonographer:    Bernadene Person RDCS Referring Phys: [id number] [place] Comments: Image acquisition challenging due to patient behavioral factors. and Image acquisition challenging due to uncooperative patient. IMPRESSIONS  1. Left ventricular ejection fraction, by estimation, is 55 to 60%. The left ventricle has normal function. The left ventricle has no regional wall motion abnormalities. There is mild left ventricular hypertrophy. Left ventricular diastolic parameters are indeterminate.  2. Right ventricular systolic function was not well visualized. The right ventricular size is not well visualized. There is normal pulmonary artery systolic pressure.  3. The mitral valve is normal in structure. Trivial mitral valve regurgitation. No evidence of mitral stenosis.  4. The aortic valve was not well visualized. Aortic valve regurgitation is not visualized. Mild to moderate aortic valve sclerosis/calcification is present, without any evidence of aortic stenosis. FINDINGS  Left Ventricle: Left ventricular ejection  fraction, by estimation, is 55 to 60%. The left ventricle has normal function. The left ventricle has no regional wall motion abnormalities. The left ventricular internal cavity size was normal in size. There is  mild left ventricular hypertrophy. Left ventricular diastolic parameters are indeterminate. Right Ventricle: The right ventricular size is not well visualized. Right vetricular wall thickness was not well visualized. Right ventricular systolic function was not well visualized. There is normal pulmonary artery systolic pressure. The tricuspid regurgitant velocity is 2.36 m/s, and with an assumed right atrial pressure of 3 mmHg, the estimated right ventricular systolic pressure is 16.1 mmHg. Left Atrium: Left atrial size was normal in size. Right Atrium: Right atrial size was normal in size. Pericardium: There is no evidence of pericardial effusion.  Mitral Valve: The mitral valve is normal in structure. Trivial mitral valve regurgitation. No evidence of mitral valve stenosis. Tricuspid Valve: The tricuspid valve is normal in structure. Tricuspid valve regurgitation is trivial. Aortic Valve: The aortic valve was not well visualized. Aortic valve regurgitation is not visualized. Mild to moderate aortic valve sclerosis/calcification is present, without any evidence of aortic stenosis. Pulmonic Valve: The pulmonic valve was not well visualized. Pulmonic valve regurgitation is not visualized. Aorta: The aortic root and ascending aorta are structurally normal, with no evidence of dilitation. IAS/Shunts: The interatrial septum was not well visualized.  LEFT VENTRICLE PLAX 2D LVIDd:         3.60 cm LVIDs:         2.60 cm LV PW:         0.90 cm LV IVS:        1.10 cm LVOT diam:     2.20 cm LV SV:         71 LV SV Index:   36 LVOT Area:     3.80 cm  RIGHT VENTRICLE TAPSE (M-mode): 1.6 cm LEFT ATRIUM             Index        RIGHT ATRIUM           Index LA diam:        3.10 cm 1.57 cm/m   RA Area:     11.30 cm LA Vol (A2C):   30.6 ml 15.46 ml/m  RA Volume:   22.60 ml  11.42 ml/m LA Vol (A4C):   31.3 ml 15.81 ml/m LA Biplane Vol: 31.1 ml 15.71 ml/m  AORTIC VALVE LVOT Vmax:   111.00 cm/s LVOT Vmean:  71.000 cm/s LVOT VTI:    0.187 m  AORTA Ao Asc diam: 3.50 cm TRICUSPID VALVE TR Peak grad:   22.3 mmHg TR Vmax:        236.00 cm/s  SHUNTS Systemic VTI:  0.19 m Systemic Diam: 2.20 cm Dani Gobble Croitoru MD Electronically signed by Sanda Klein MD Signature Date/Time: 08/21/2021/1:58:30 PM    Final    IR NEPHROSTOMY PLACEMENT LEFT  Result Date: 08/20/2021 INDICATION: History of metastatic prostate cancer, now with bilateral obstructive uropathy. Please perform placement of bilateral nephrostomy catheters for urinary diversion purposes. EXAM: ULTRASOUND AND FLUOROSCOPIC GUIDED PLACEMENT OF BILATERAL NEPHROSTOMY TUBES COMPARISON:  CT abdomen pelvis-08/19/2021 MEDICATIONS:  Patient is currently admitted to the hospital receiving intravenous antibiotics.; The antibiotic was administered in an appropriate time frame prior to skin puncture. ANESTHESIA/SEDATION: Moderate (conscious) sedation was employed during this procedure, administered by the interventional radiology RN. A total of Versed 0.5 mg was administered intravenously. Moderate Sedation Time: 25 minutes. The patient's level of consciousness and vital signs  were monitored continuously by radiology nursing throughout the procedure under my direct supervision. CONTRAST:  40 mL Omnipaque 350-administered into both renal collecting systems. FLUOROSCOPY TIME:  1 minute, 48 seconds (1.3 mGy) COMPLICATIONS: None immediate. PROCEDURE: The procedure, risks, benefits, and alternatives were explained to the the patient's family, questions were encouraged and answered and informed consent was obtained. A timeout was performed prior to the initiation of the procedure. The operative sites were prepped and draped in the usual sterile fashion and a sterile drape was applied covering the operative field. A sterile gown and sterile gloves were used for the procedure. Local anesthesia was provided with 1% Lidocaine with epinephrine. Beginning with the left kidney, ultrasound was used to localize the left kidney. Under direct ultrasound guidance, a 20 gauge needle was advanced into the renal collecting system. An ultrasound image documentation was performed. Access within the collecting system was confirmed with the efflux of urine followed by limited contrast injection. Under intermittent fluoroscopic guidance, an 0.018 wire was advanced into the collecting system and the tract was dilated with an Accustick stent. Next, over a short Amplatz wire, the track was further dilated ultimately allowing placement of a 10-French percutaneous nephrostomy catheter with end coiled and locked within the renal pelvis. Contrast was injected and several spot  fluoroscopic images were obtained in various obliquities. The contralateral procedure was repeated for the right-sided nephrostomy catheter however note, an upper pole access was acquired secondary to angulation of the right kidney with the inferior pole being located deep and medial with poor sonographic visualization, accentuated due to patient's medical instability and inability to lie prone on the fluoroscopy table. Ultimately, under ultrasound fluoroscopic guidance, a 10 French nephrostomy catheter was placed via a posterosuperior calyx with end coiled and locked within the right renal pelvis. Contrast was injected several spot fluoroscopic images were obtained in various obliquities Both catheters were secured at the skin entrance site within interrupted sutures and StatLock devices. Both nephrostomies were connected to gravity bags. Dressings were applied. The patient tolerated procedure well without immediate postprocedural complication. FINDINGS: Ultrasound scanning demonstrates a moderate dilated bilateral collecting systems. Under a combination of ultrasound and fluoroscopic guidance, a left-sided posterior inferior calix was targeted allowing placement of a 10-French percutaneous nephrostomy catheter with end coiled and locked within the renal pelvis. Contrast injection confirmed appropriate positioning. Under a combination of ultrasound and fluoroscopic guidance, a right-sided posterosuperior calyx was targeted (an inferior calyx was unable to be accessed secondary to angulation of the right kidney as well as patient's somewhat unstable status and inability to lie on the fluoroscopy table) allowing placement of a 10 French percutaneous nephrostomy catheter with end coiled and locked within the renal pelvis. Contrast injection confirmed appropriate positioning. IMPRESSION: Successful ultrasound and fluoroscopic guided placement of bilateral 10 French percutaneous nephrostomy catheters. Note, as above,  the left-sided nephrostomy catheter is via a posteroinferior calyx while the right-sided nephrostomy catheter is via a posterosuperior caliceal access. Electronically Signed   By: Sandi Mariscal M.D.   On: 08/20/2021 12:43   IR NEPHROSTOMY PLACEMENT RIGHT  Result Date: 08/20/2021 INDICATION: History of metastatic prostate cancer, now with bilateral obstructive uropathy. Please perform placement of bilateral nephrostomy catheters for urinary diversion purposes. EXAM: ULTRASOUND AND FLUOROSCOPIC GUIDED PLACEMENT OF BILATERAL NEPHROSTOMY TUBES COMPARISON:  CT abdomen pelvis-08/19/2021 MEDICATIONS: Patient is currently admitted to the hospital receiving intravenous antibiotics.; The antibiotic was administered in an appropriate time frame prior to skin puncture. ANESTHESIA/SEDATION: Moderate (conscious) sedation was employed during this procedure,  administered by the interventional radiology RN. A total of Versed 0.5 mg was administered intravenously. Moderate Sedation Time: 25 minutes. The patient's level of consciousness and vital signs were monitored continuously by radiology nursing throughout the procedure under my direct supervision. CONTRAST:  40 mL Omnipaque 350-administered into both renal collecting systems. FLUOROSCOPY TIME:  1 minute, 48 seconds (1.3 mGy) COMPLICATIONS: None immediate. PROCEDURE: The procedure, risks, benefits, and alternatives were explained to the the patient's family, questions were encouraged and answered and informed consent was obtained. A timeout was performed prior to the initiation of the procedure. The operative sites were prepped and draped in the usual sterile fashion and a sterile drape was applied covering the operative field. A sterile gown and sterile gloves were used for the procedure. Local anesthesia was provided with 1% Lidocaine with epinephrine. Beginning with the left kidney, ultrasound was used to localize the left kidney. Under direct ultrasound guidance, a 20 gauge  needle was advanced into the renal collecting system. An ultrasound image documentation was performed. Access within the collecting system was confirmed with the efflux of urine followed by limited contrast injection. Under intermittent fluoroscopic guidance, an 0.018 wire was advanced into the collecting system and the tract was dilated with an Accustick stent. Next, over a short Amplatz wire, the track was further dilated ultimately allowing placement of a 10-French percutaneous nephrostomy catheter with end coiled and locked within the renal pelvis. Contrast was injected and several spot fluoroscopic images were obtained in various obliquities. The contralateral procedure was repeated for the right-sided nephrostomy catheter however note, an upper pole access was acquired secondary to angulation of the right kidney with the inferior pole being located deep and medial with poor sonographic visualization, accentuated due to patient's medical instability and inability to lie prone on the fluoroscopy table. Ultimately, under ultrasound fluoroscopic guidance, a 10 French nephrostomy catheter was placed via a posterosuperior calyx with end coiled and locked within the right renal pelvis. Contrast was injected several spot fluoroscopic images were obtained in various obliquities Both catheters were secured at the skin entrance site within interrupted sutures and StatLock devices. Both nephrostomies were connected to gravity bags. Dressings were applied. The patient tolerated procedure well without immediate postprocedural complication. FINDINGS: Ultrasound scanning demonstrates a moderate dilated bilateral collecting systems. Under a combination of ultrasound and fluoroscopic guidance, a left-sided posterior inferior calix was targeted allowing placement of a 10-French percutaneous nephrostomy catheter with end coiled and locked within the renal pelvis. Contrast injection confirmed appropriate positioning. Under a  combination of ultrasound and fluoroscopic guidance, a right-sided posterosuperior calyx was targeted (an inferior calyx was unable to be accessed secondary to angulation of the right kidney as well as patient's somewhat unstable status and inability to lie on the fluoroscopy table) allowing placement of a 10 French percutaneous nephrostomy catheter with end coiled and locked within the renal pelvis. Contrast injection confirmed appropriate positioning. IMPRESSION: Successful ultrasound and fluoroscopic guided placement of bilateral 10 French percutaneous nephrostomy catheters. Note, as above, the left-sided nephrostomy catheter is via a posteroinferior calyx while the right-sided nephrostomy catheter is via a posterosuperior caliceal access. Electronically Signed   By: Sandi Mariscal M.D.   On: 08/20/2021 12:43    Labs:  CBC: Recent Labs    08/20/21 0308 08/20/21 1250 08/20/21 2340 08/21/21 2010 08/22/21 0204 08/22/21 1641 08/23/21 0313  WBC 17.2*  --  16.7*  --  14.3*  --  12.0*  HGB 6.6*   < > 8.4*  8.4* 8.2* 8.0*  8.5* 8.1*  HCT 20.5*   < > 25.6*  25.3* 25.2* 24.7* 26.3* 25.2*  PLT 129*  --  93*  --  88*  --  86*   < > = values in this interval not displayed.    COAGS: Recent Labs    08/18/21 0301  INR 1.3*    BMP: Recent Labs    08/21/21 2010 08/22/21 0204 08/22/21 1641 08/23/21 0313  NA 149* 147* 144 142  K 3.2* 3.1* 3.5 3.6  CL 115* 114* 114* 110  CO2 17* 20* 17* 21*  GLUCOSE 177* 140* 132* 110*  BUN 69* 64* 58* 54*  CALCIUM 8.4* 8.7* 8.5* 8.7*  CREATININE 5.46* 5.28* 4.65* 4.34*  GFRNONAA 10* 10* 12* 13*    LIVER FUNCTION TESTS: Recent Labs    08/19/21 0221 08/20/21 0308 08/20/21 2340 08/23/21 0313  BILITOT 0.8 0.9 0.9 0.7  AST 14* 24 17 15   ALT 13 7 7 6   ALKPHOS 357* 294* 269* 280*  PROT 6.6 6.0* 6.0* 6.1*  ALBUMIN 2.4* 2.2* 2.2* 2.0*    Assessment and Plan:  Patient observed to be resting in bed. Anthony Dudley is watching tv but does not respond when  questioned.   Due to history of dementia patient is poor historian.   -Right nephrostomy tube insertion site unremarkable with sutures and StatLock in place.  No redness, drainage or other signs of infection noted.  Right JP drain has 450 mLclear, yellow urine with 1100 mL documented in epic for the past 24 hours. Dressing has small amount of dried blood. -Left nephrostomy tube insertion site unremarkable with sutures and StatLock in place.  No redness, drainage or other signs of infection noted.  Left JP drain has approximately 250 mL red-colored urine with 595 mL documented in epic for the past 24 hours.  Dressing has small amount of dried blood.  WBC 12.0  Afebrile  Creat 4.34  GFR 13 Anthony Dudley is in no distress. Patient's vital signs are stable.    Document bilateral nephrostomy PO q shift Keep dressings clean and dry, change as needed.  Call IR for questions or concerns.   Electronically Signed: Tyson Alias, NP 08/23/2021, 2:36 PM   I spent a total of 15 Minutes at the the patient's bedside AND on the patient's hospital floor or unit, greater than 50% of which was counseling/coordinating care for bilateral nephrostomy tubes.

## 2021-08-23 NOTE — Progress Notes (Addendum)
Pharmacy Antibiotic Note  Anthony Dudley is a 87 y.o. male admitted on 08/17/2021 with AKI on CKD III, metabolic acidosis, sepsis, UTI (indwelling catheter), prostate cancer. Pharmacy has been consulted for Vancomycin  Day# 5/5 for pneumonia and Cefepime D# 6/73 for sepsis 2/2 UTI; per provider notes, pt has prior cx with MDR Proteus susceptible to cefepime.   Sepsis UTI/ CAP:  Cefepime for UTI,  vanc added for PNA,   plan for 5 days of vanc and 7 days of Cefepime   LA 1.1>0.9.    Afeb, wbc 14.3>12 MRSA PCR : positive -UOP improved s/p bilateral nephrostomy tube placement per IR on 10/6.  SCr  high but trending down to 4.34 today. Urologist recommends continue ABX to treat empirically for pyelonephritis  Today 08/23/21, vancomycin random level = 22 mcg/ml approimately 49 hrs post last vancomycin dose given on 08/21/21.   Will give vancomycin 1000 mg IV x1 tonight when expect vanco level will be <15-20 mc  Plan: Continue  Cefepime to 2g IV Q24H (day #6/7) Redose vancomycin 1000 mg tonight x 1 at 22:00 tonight. Varible vancomycin dosing based on random level and renal function. Day # 5 of 5 vancomycin. Will f/u with MD to stop vancomycin. Monitor WBC, temp, clinical course, cultures, renal function    Height: 5\' 9"  (175.3 cm) (unable to stand, approximate measurement lying in bed.) Weight: 84.8 kg (186 lb 15.2 oz) IBW/kg (Calculated) : 70.7  Temp (24hrs), Avg:98.3 F (36.8 C), Min:98.1 F (36.7 C), Max:98.5 F (36.9 C)  Recent Labs  Lab 08/18/21 0301 08/18/21 0629 08/19/21 0221 08/19/21 2219 08/20/21 0308 08/20/21 0425 08/20/21 6213 08/20/21 2340 08/21/21 0432 08/21/21 1122 08/21/21 2010 08/22/21 0204 08/22/21 1641 08/23/21 0313 08/23/21 1250  WBC 11.7*  --  9.4  --  17.2*  --   --  16.7*  --   --   --  14.3*  --  12.0*  --   CREATININE 5.76*  --  5.72*   < > 6.35*  --   --  5.98*  --  5.75* 5.46* 5.28* 4.65* 4.34*  --   LATICACIDVEN 1.0 1.1  --   --   --  1.0 0.9  --   --    --   --   --   --   --   --   VANCORANDOM  --   --   --   --   --   --   --   --    < >  --   --  35  --   --  22   < > = values in this interval not displayed.     Estimated Creatinine Clearance: 12.2 mL/min (A) (by C-G formula based on SCr of 4.34 mg/dL (H)).    Allergies  Allergen Reactions   Solanum Lycopersicum [Tomato]     Patient reports he is not allergic to tomato and he eats them all the time    Antimicrobials this admission: Cefepime 10/5 >> Cefazolin X 1 for procedure 10/6 Vancomycin 10/6 >>  Levels and dose adjustments: 10/8 VR 19 ,  redose 1250 mg x1 10/8  10/9 VR 35, no dose  10/10 VR 22 ,  will redose Vancomycin 1000 mg x1 @ 22:00 on 08/23/21  Microbiology results: 10/5 BCx - negative 10/5 UC - negative  10/6 MRSA PCR: positive  Thank you for allowing pharmacy to participate in this patient's care.  Nicole Cella, RPh Clinical Pharmacist [phone] 08/23/2021 4:29 PM  Check [email or web address] for unit specific pharmacy number

## 2021-08-24 DIAGNOSIS — N183 Chronic kidney disease, stage 3 unspecified: Secondary | ICD-10-CM | POA: Diagnosis not present

## 2021-08-24 DIAGNOSIS — N179 Acute kidney failure, unspecified: Secondary | ICD-10-CM | POA: Diagnosis not present

## 2021-08-24 LAB — COMPREHENSIVE METABOLIC PANEL
ALT: <5 U/L (ref 0–44)
AST: 12 U/L — ABNORMAL LOW (ref 15–41)
Albumin: 2 g/dL — ABNORMAL LOW (ref 3.5–5.0)
Alkaline Phosphatase: 285 U/L — ABNORMAL HIGH (ref 38–126)
Anion gap: 10 (ref 5–15)
BUN: 49 mg/dL — ABNORMAL HIGH (ref 8–23)
CO2: 21 mmol/L — ABNORMAL LOW (ref 22–32)
Calcium: 8.5 mg/dL — ABNORMAL LOW (ref 8.9–10.3)
Chloride: 109 mmol/L (ref 98–111)
Creatinine, Ser: 3.77 mg/dL — ABNORMAL HIGH (ref 0.61–1.24)
GFR, Estimated: 15 mL/min — ABNORMAL LOW (ref >60)
Glucose, Bld: 95 mg/dL (ref 70–99)
Potassium: 3.5 mmol/L (ref 3.5–5.1)
Sodium: 140 mmol/L (ref 135–145)
Total Bilirubin: 0.4 mg/dL (ref 0.3–1.2)
Total Protein: 5.8 g/dL — ABNORMAL LOW (ref 6.5–8.1)

## 2021-08-24 LAB — CBC WITH DIFFERENTIAL/PLATELET
Abs Immature Granulocytes: 0.46 10*3/uL — ABNORMAL HIGH (ref 0.00–0.07)
Basophils Absolute: 0 10*3/uL (ref 0.0–0.1)
Basophils Relative: 0 %
Eosinophils Absolute: 0.3 10*3/uL (ref 0.0–0.5)
Eosinophils Relative: 3 %
HCT: 26.2 % — ABNORMAL LOW (ref 39.0–52.0)
Hemoglobin: 8.5 g/dL — ABNORMAL LOW (ref 13.0–17.0)
Immature Granulocytes: 5 %
Lymphocytes Relative: 20 %
Lymphs Abs: 2.1 10*3/uL (ref 0.7–4.0)
MCH: 30.6 pg (ref 26.0–34.0)
MCHC: 32.4 g/dL (ref 30.0–36.0)
MCV: 94.2 fL (ref 80.0–100.0)
Monocytes Absolute: 0.5 10*3/uL (ref 0.1–1.0)
Monocytes Relative: 5 %
Neutro Abs: 6.7 10*3/uL (ref 1.7–7.7)
Neutrophils Relative %: 67 %
Platelets: 83 10*3/uL — ABNORMAL LOW (ref 150–400)
RBC: 2.78 MIL/uL — ABNORMAL LOW (ref 4.22–5.81)
RDW: 18.6 % — ABNORMAL HIGH (ref 11.5–15.5)
WBC: 10.1 10*3/uL (ref 4.0–10.5)
nRBC: 0 % (ref 0.0–0.2)

## 2021-08-24 LAB — MAGNESIUM: Magnesium: 1.6 mg/dL — ABNORMAL LOW (ref 1.7–2.4)

## 2021-08-24 LAB — PHOSPHORUS: Phosphorus: 3.2 mg/dL (ref 2.5–4.6)

## 2021-08-24 MED ORDER — MAGNESIUM SULFATE IN D5W 1-5 GM/100ML-% IV SOLN
1.0000 g | Freq: Once | INTRAVENOUS | Status: AC
  Administered 2021-08-24: 1 g via INTRAVENOUS
  Filled 2021-08-24: qty 100

## 2021-08-24 MED ORDER — ADULT MULTIVITAMIN W/MINERALS CH
1.0000 | ORAL_TABLET | Freq: Every day | ORAL | Status: DC
  Administered 2021-08-25 – 2021-08-27 (×3): 1 via ORAL
  Filled 2021-08-24 (×3): qty 1

## 2021-08-24 NOTE — Progress Notes (Addendum)
PROGRESS NOTE    Anthony Dudley  [name]:[id number] DOB: 1933/11/07 DOA: 08/17/2021 PCP: [name], MD   Chief Complaint  Patient presents with   Acute Renal Failure   Abnormal Lab   Brief Narrative:  87 yo with hx of metastatic prostate cancer, chronic indwelling foley, CKD stage III, hx MDR UTI, dementia, chronic bedbound status, HFpEF, HTN and multiple other medical problems presenting with AKI on CKD from the nursing home.    Creatinine reportedly 2.5 on 8/26.   Cr reportedly 6.1 on 10/3.  He was recently transfused with Xochilth Standish HB of 6.6 at [facility] on 08/10/21.    He was admitted with sepsis 2/2 UTI and AKI on CKD.  Urology was consulted due to hydronephrosis.  He had cystoscopy and fulguration of bladder on 10/5 with [name] failed attempt at stent placement.  IR was consulted for bilateral nephrostomy tubes which were placed on 10/6.  His hospitalization was complicated by SVT and he was started on amiodarone and cardiology and PCCM were consulted due to hypotension.  He's gradually improving over the past few days with improved renal function.    Urology continuing to follow.  His brother has been very involved.    Assessment & Plan:   Principal Problem:   Acute-on-chronic kidney injury ([place]) Active Problems:   Metabolic acidosis   Sepsis ([place])   UTI (urinary tract infection) due to urinary indwelling catheter (HCC)   Prostate cancer (HCC)  Goals of care Discussed with brother 10/5.  Pt full code, but he'd like to know if anything were to happen.  Discussed [name] not [name] great dialysis candidate and his brother seems to understand this.  Will continue medical care, follow urology c/s. Brother not ready for palliative involvement or change in code status at this time.  Will continue to follow based on [name]'s clinical status.  SVT  Hypotension Appreciate cardiology and PCCM assistance Currently in sinus rhythm  Started on amiodarone - will d/c  amiodarone based on conversation with Dr. Virgina Jock 10/10 Appreciate cardiology recs (10/7) - amiodarone for now, consider IVF Echo with EF 55-60% (see report)  TSH wnl  Acute Hypoxic Respiratory Failure Severe multilobar bilateral bronchopneumonia noted Suspect compoent pulm edema with renal failure/hx HFpEF Hypotonic IVF at this time for hypernatremia Abx broadened to include vanc/cefepime.  Plan for 7 days. Hopefully will have post obstructive diuresis after perc tubes placed - follow I/o ("since admit" inaccurrate with CBI)  Acute Blood Loss Anemia  Acute on chronic anemia 2/2 IVF, blood loss from hematuria S/p 2 unit pRBC -> trend, stable today Blood in L>R nephrostomy tubes - hold further heparin - improving Hb 6.6 on 9/27, s/p 2 units pRBC 09/2019 hb 9.4 Labs c/w AOCD with iron def  Sepsis 2/2 CAUTI  CAP  Leukocytosis Tachypnea, tachycardia, leukocytosis and UA concerning for UTI - meets criteria for sepsis Prior cx with MDR proteus sensitive to cefepime Continue cefepime.  Vanc added with concern for pneumonia above.  Plan for 5-7 days abx (follow clinically - will plan for 7 days -> will discuss with pharmacy today). Follow blood (Ngx2) and urine cultures (multiple species) Now s/p perc nephrostomy tube placement UA with large LE, many bacteria, 21-50 RBC, >50 WBC Foley exchanged in ED Urology consulted - now s/p perc nephrostomy tubes  AKI on CKD III  Bilateral Hydronephrosis  Metabolic Acidosis  Hypernatremia Creatinine 2.5 on 8/26 reportedly 6.1 on 10/3 reportedly Creatinine improving after nephrostomy tube placement ->  CT  with moderate bilateral hydro Renal US with bilateral hydro L>R Start hypotonic IVF, follow sodium - continued improvement Urology c/s, appreciate recs  S/p bilateral nephrostomy tubes urology to discuss antegrade placement of ureteral stents once stable for that (watching for further improvement in creatinine) Will follow repeat  labs  Hx of Prostate Cancer Diffuse rib sclerosis c/w metastatic disease Urology c/s - degarelix per urology PSA elevated - likely signifies widely metastatic cancer per urology, follow testosterone (39) Consider bone scan when stablized Will need treatment escalation pending further staging studies  Urethral Erosion  Urinary Retention Foley for now, per urology D/c flomax per urology  Hematuria  Due to procedure, irritation of local tumor in bladder CBI per urology -> resolved from bladder  HFpEF Fluids, volume as above  Thrombocytopenia Mild downtrend, will continue to monitor - likely in setting of infection above  DVT prophylaxis: SCD Code Status: full Family Communication: none at bedside - brother over phone 10/9 Disposition:   Status is: Inpatient  Remains inpatient appropriate because:Inpatient level of care appropriate due to severity of illness  Dispo: The patient is from: Home              Anticipated d/c is to: Home              Patient currently is not medically stable to d/c.   Difficult to place patient No  Consultants:  urology  Procedures:  none  Antimicrobials:  Anti-infectives (From admission, onward)    Start     Dose/Rate Route Frequency Ordered Stop   08/23/21 2200  vancomycin (VANCOCIN) IVPB 1000 mg/200 mL premix        1,000 mg 200 mL/hr over 60 Minutes Intravenous  Once 08/23/21 1631 08/23/21 2256   08/22/21 1800  ceFEPIme (MAXIPIME) 2 g in sodium chloride 0.9 % 100 mL IVPB        2 g 200 mL/hr over 30 Minutes Intravenous Every 24 hours 08/22/21 0727     08/21/21 0830  vancomycin (VANCOREADY) IVPB 1250 mg/250 mL        1,250 mg 166.7 mL/hr over 90 Minutes Intravenous  Once 08/21/21 0733 08/21/21 1842   08/20/21 1800  ceFEPIme (MAXIPIME) 1 g in sodium chloride 0.9 % 100 mL IVPB  Status:  Discontinued        1 g 200 mL/hr over 30 Minutes Intravenous Every 24 hours 08/20/21 1457 08/22/21 0727   08/19/21 1800  ceFEPIme (MAXIPIME) 2 g in  sodium chloride 0.9 % 100 mL IVPB  Status:  Discontinued        2 g 200 mL/hr over 30 Minutes Intravenous Every 24 hours 08/19/21 1553 08/20/21 1457   08/19/21 1645  vancomycin (VANCOREADY) IVPB 1500 mg/300 mL        1,500 mg 150 mL/hr over 120 Minutes Intravenous  Once 08/19/21 1551 08/19/21 1911   08/19/21 1551  vancomycin variable dose per unstable renal function (pharmacist dosing)         Does not apply See admin instructions 08/19/21 1551     08/19/21 1545  ceFAZolin (ANCEF) IVPB 2g/100 mL premix        2 g 200 mL/hr over 30 Minutes Intravenous To Radiology 08/19/21 1452 08/19/21 1655   08/19/21 0000  ceFEPIme (MAXIPIME) 1 g in sodium chloride 0.9 % 100 mL IVPB  Status:  Discontinued        1 g 200 mL/hr over 30 Minutes Intravenous Every 24 hours 08/18/21 0254 08/19/21 1553   08/18/21 0115  ceFEPIme (MAXIPIME) 1 g in sodium chloride 0.9 % 100 mL IVPB        1 g 200 mL/hr over 30 Minutes Intravenous  Once 08/18/21 0111 08/18/21 0453          Subjective: Pleasantly confused  Objective: Vitals:   08/23/21 1924 08/23/21 2200 08/24/21 0011 08/24/21 0254  BP: 116/67 119/61 117/71 111/60  Pulse: (!) 101  91 76  Resp: (!) 24  (!) 24 18  Temp: 97.7 F (36.5 C)  98.3 F (36.8 C) 98 F (36.7 C)  TempSrc: Oral  Tympanic Oral  SpO2: 100%  100% 100%  Weight:   84.7 kg   Height:        Intake/Output Summary (Last 24 hours) at 08/24/2021 0955 Last data filed at 08/24/2021 0556 Gross per 24 hour  Intake 2168.76 ml  Output 2175 ml  Net -6.24 ml   Filed Weights   08/22/21 0018 08/22/21 2345 08/24/21 0011  Weight: 82.8 kg 84.8 kg 84.7 kg    Examination:  General: No acute distress. Cardiovascular: RRR Lungs: unlabored Abdomen: Soft, nontender, nondistended  Neurological: Alert and oriented 3. Moves all extremities 4 . Cranial nerves II through XII grossly intact. Skin: Warm and dry. No rashes or lesions. Extremities: No clubbing or cyanosis. No edema.   Data  Reviewed: I have personally reviewed following labs and imaging studies  CBC: Recent Labs  Lab 08/19/21 0221 08/19/21 1811 08/20/21 0308 08/20/21 1250 08/20/21 2340 08/21/21 2010 08/22/21 0204 08/22/21 1641 08/23/21 0313 08/24/21 0242  WBC 9.4  --  17.2*  --  16.7*  --  14.3*  --  12.0* 10.1  NEUTROABS 8.1*  --  15.8*  --  13.9*  --   --   --  9.4* 6.7  HGB 7.7*   < > 6.6*   < > 8.4*  8.4* 8.2* 8.0* 8.5* 8.1* 8.5*  HCT 24.0*   < > 20.5*   < > 25.6*  25.3* 25.2* 24.7* 26.3* 25.2* 26.2*  MCV 99.2  --  101.5*  --  93.8  --  93.2  --  95.8 94.2  PLT 120*  --  129*  --  93*  --  88*  --  86* 83*   < > = values in this interval not displayed.    Basic Metabolic Panel: Recent Labs  Lab 08/19/21 0221 08/19/21 2219 08/20/21 0308 08/20/21 2340 08/21/21 1122 08/21/21 2010 08/22/21 0204 08/22/21 1641 08/23/21 0313 08/24/21 0242  NA 145  --  146* 148*   < > 149* 147* 144 142 140  K 4.4  --  4.8 3.7   < > 3.2* 3.1* 3.5 3.6 3.5  CL 114*  --  112* 113*   < > 115* 114* 114* 110 109  CO2 18*  --  17* 19*   < > 17* 20* 17* 21* 21*  GLUCOSE 140*  --  111* 99   < > 177* 140* 132* 110* 95  BUN 71*  --  78* 73*   < > 69* 64* 58* 54* 49*  CREATININE 5.72*   < > 6.35* 5.98*   < > 5.46* 5.28* 4.65* 4.34* 3.77*  CALCIUM 9.0  --  8.3* 8.4*   < > 8.4* 8.7* 8.5* 8.7* 8.5*  MG 1.8  --  1.6* 1.7  --  1.8  --   --  1.6* 1.6*  PHOS 4.5  --  4.2 4.3  --   --   --   --  3.0 3.2   < > = values in this interval not displayed.    GFR: Estimated Creatinine Clearance: 14.1 mL/min ([place] Haydon) (by C-G formula based on SCr of 3.77 mg/dL (H)).  Liver Function Tests: Recent Labs  Lab 08/19/21 0221 08/20/21 0308 08/20/21 2340 08/23/21 0313 08/24/21 0242  AST 14* 24 17 15  12*  ALT 13 7 7 6  <5  ALKPHOS 357* 294* 269* 280* 285*  BILITOT 0.8 0.9 0.9 0.7 0.4  PROT 6.6 6.0* 6.0* 6.1* 5.8*  ALBUMIN 2.4* 2.2* 2.2* 2.0* 2.0*    CBG: No results for input(s): GLUCAP in the last 168 hours.   Recent Results  (from the past 240 hour(s))  Urine Culture     Status: Abnormal   Collection Time: 08/17/21 11:16 PM   Specimen: Urine, Catheterized  Result Value Ref Range Status   Specimen Description URINE, CATHETERIZED  Final   Special Requests   Final    NONE Performed at [facility], [address] [place], [place] [address]    Culture MULTIPLE SPECIES PRESENT, SUGGEST RECOLLECTION (Odie Edmonds)  Final   Report Status 08/18/2021 FINAL  Final  Resp Panel by RT-PCR (Flu Marieke Lubke&B, Covid) Nasopharyngeal Swab     Status: None   Collection Time: 08/17/21 11:19 PM   Specimen: Nasopharyngeal Swab; Nasopharyngeal(NP) swabs in vial transport medium  Result Value Ref Range Status   SARS Coronavirus 2 by RT PCR NEGATIVE NEGATIVE Final    Comment: (NOTE) SARS-CoV-2 target nucleic acids are NOT DETECTED.  The SARS-CoV-2 RNA is generally detectable in upper respiratory specimens during the acute phase of infection. The lowest concentration of SARS-CoV-2 viral copies this assay can detect is 138 copies/mL. Leander Tout negative result does not preclude SARS-Cov-2 infection and should not be used as the sole basis for treatment or other patient management decisions. Daylyn Christine negative result may occur with  improper specimen collection/handling, submission of specimen other than nasopharyngeal swab, presence of viral mutation(s) within the areas targeted by this assay, and inadequate number of viral copies(<138 copies/mL). Marylynn Rigdon negative result must be combined with clinical observations, patient history, and epidemiological information. The expected result is Negative.  Fact Sheet for Patients:  EntrepreneurPulse.com.au  Fact Sheet for Healthcare Providers:  IncredibleEmployment.be  This test is no t yet approved or cleared by the [place] FDA and  has been authorized for detection and/or diagnosis of SARS-CoV-2 by FDA under an Emergency Use Authorization (EUA). This EUA will remain  in  effect (meaning this test can be used) for the duration of the COVID-19 declaration under Section 564(b)(1) of the Act, 21 U.S.C.section 360bbb-3(b)(1), unless the authorization is terminated  or revoked sooner.       Influenza Shamiyah Ngu by PCR NEGATIVE NEGATIVE Final   Influenza B by PCR NEGATIVE NEGATIVE Final    Comment: (NOTE) The Xpert Xpress SARS-CoV-2/FLU/RSV plus assay is intended as an aid in the diagnosis of influenza from Nasopharyngeal swab specimens and should not be used as Cameryn Schum sole basis for treatment. Nasal washings and aspirates are unacceptable for Xpert Xpress SARS-CoV-2/FLU/RSV testing.  Fact Sheet for Patients: EntrepreneurPulse.com.au  Fact Sheet for Healthcare Providers: IncredibleEmployment.be  This test is not yet approved or cleared by the [place] FDA and has been authorized for detection and/or diagnosis of SARS-CoV-2 by FDA under an Emergency Use Authorization (EUA). This EUA will remain in effect (meaning this test can be used) for the duration of the COVID-19 declaration under Section 564(b)(1) of the Act, 21 U.S.C. section 360bbb-3(b)(1), unless  the authorization is terminated or revoked.  Performed at [facility], [place] [address], [place], [place] [address]   Culture, blood (routine x 2)     Status: None   Collection Time: 08/18/21  2:55 AM   Specimen: BLOOD  Result Value Ref Range Status   Specimen Description BLOOD RIGHT ANTECUBITAL  Final   Special Requests   Final    BOTTLES DRAWN AEROBIC AND ANAEROBIC Blood Culture adequate volume   Culture   Final    NO GROWTH 5 DAYS Performed at [facility], [place] [address], [place], [place] [address]    Report Status 08/23/2021 FINAL  Final  Culture, blood (routine x 2)     Status: None   Collection Time: 08/18/21  3:01 AM   Specimen: BLOOD  Result Value Ref Range Status   Specimen Description BLOOD BLOOD RIGHT WRIST  Final   Special Requests   Final     BOTTLES DRAWN AEROBIC AND ANAEROBIC Blood Culture adequate volume   Culture   Final    NO GROWTH 5 DAYS Performed at [facility], [place] [address], [place], [place] [address]    Report Status 08/23/2021 FINAL  Final  MRSA Next Gen by PCR, Nasal     Status: Abnormal   Collection Time: 08/19/21  3:50 PM   Specimen: Nasal Mucosa; Nasal Swab  Result Value Ref Range Status   MRSA by PCR Next Gen DETECTED (Latiesha Harada) NOT DETECTED Final    Comment: RESULT CALLED TO, READ BACK BY AND VERIFIED WITH: K DUFFY RN 1855 08/19/21 Ireene Ballowe BROWNING (NOTE) The GeneXpert MRSA Assay (FDA approved for NASAL specimens only), is one component of Ajah Vanhoose comprehensive MRSA colonization surveillance program. It is not intended to diagnose MRSA infection nor to guide or monitor treatment for MRSA infections. Test performance is not FDA approved in patients less than 22 years old. Performed at [facility], [place] [address] [place], [place] [address]          Radiology Studies: No results found.      Scheduled Meds:  sodium chloride   Intravenous Once   sodium chloride   Intravenous Once   acetaminophen  1,000 mg Oral Q8H   Chlorhexidine Gluconate Cloth  6 each Topical Daily   Chlorhexidine Gluconate Cloth  6 each Topical Q0600   magnesium oxide  400 mg Oral BID   mupirocin ointment  1 application Nasal BID   oxybutynin  5 mg Oral QHS   vancomycin variable dose per unstable renal function (pharmacist dosing)   Does not apply See admin instructions   Continuous Infusions:  amiodarone 30 mg/hr (08/24/21 0236)   ceFEPime (MAXIPIME) IV 2 g (08/23/21 1744)   dextrose 50 mL/hr at 08/24/21 0918   lactated ringers     sodium chloride irrigation       LOS: 6 days    Time spent: over 30 min   Fayrene Helper, MD [facility]   To contact the attending provider between 7A-7P or the covering provider during after hours 7P-7A, please log into the web site [email or web address] and access using  universal [place] password for that web site. If you do not have the password, please call the hospital operator.  08/24/2021, 9:55 AM

## 2021-08-24 NOTE — Progress Notes (Signed)
Initial Nutrition Assessment  DOCUMENTATION CODES:   Not applicable  INTERVENTION:   -MVI with minerals daily -Magic cup TID with meals, each supplement provides 290 kcal and 9 grams of protein  -Double protein portions with meals -Feeding assistance with meals  NUTRITION DIAGNOSIS:   Inadequate oral intake related to lethargy/confusion as evidenced by meal completion < 25%.  GOAL:   Patient will meet greater than or equal to 90% of their needs  MONITOR:   PO intake, Supplement acceptance, Diet advancement, Labs, Weight trends, Skin, I & O's  REASON FOR ASSESSMENT:   Low Braden    ASSESSMENT:   Anthony Dudley is a 87 y.o. male with medical history significant of prostate cancer, CKD stage III, chronic indwelling Foley catheter, MDR UTI, dementia, bedbound at baseline, chronic diastolic CHF, hypertension, hyperlipidemia COVID infection in June 2020. He was sent to the ED from his nursing home for evaluation of worsening kidney function.  His creatinine was 2.5 on 07/09/21, 5.7 on 08/09/21, and now 6.1 on 08/16/21.  His hemoglobin was 6.6 on 08/10/21 and he received 2 units PRBCs at [facility], hemoglobin 9.0 at his facility on 08/16/21.  Pt admitted with AKI on CKD II, metabolic acidosis, and sepsis secondary to CAUTI.   10/5- s/p BSE- advanced to dysphagia 1 diet with thin liquids; s/p cystoscopy and fulguration of bladder 10/6- s/p placement of bilateral PCNs 10/7- s/p BSE- downgraded to dysphagia 1 diet with honey thick liquids  Reviewed I/O's: -843 ml x 24 hours and -14.4 L since admission   Pt unavailable at time of visit. Attempted to speak with pt via call to hospital room phone, however, unable to reach. RD unable to obtain further nutrition-related history or complete nutrition-focused physical exam at this time.    Per MD notes, pt was diagnosed with prostate cancer in 2019 with metastatic disease to bone. Due to COVID-19 pandemic and residence in a SNF, pt has  received fragmented care.  Per chart review, pt with dementia and oriented to self only; he is unable to provide history.   Per urology notes, hopeful plan to internalize to antegrade uretal stents this week.   Per SLP notes, no plan for diet upgrade currently as pt remains mildly agitated and restless. Pt not accepting of sufficient trials to assess for potential diet upgrade. Noted pt has been agitated and often pulling out lines, etc. Noted meal completion has significantly declined over the past 3 days (meal completion 0-20%). Previously pt with good oral intake, consuming 60-70% of meals.   No wt history available to assess at this time.   MD suggesting goals of care discussions, however, pt brother not ready to change code status just yet.   Medications reviewed and include magnesium oxide and dextrose 5% solution @ 50 ml/hr.   Labs reviewed. Mg: 1.6.   Diet Order:   Diet Order             DIET - DYS 1 Room service appropriate? Yes; Fluid consistency: Honey Thick  Diet effective now                   EDUCATION NEEDS:   Not appropriate for education at this time  Skin:  Skin Assessment: Skin Integrity Issues: Skin Integrity Issues:: Incisions Incisions: closed penis  Last BM:  08/23/21  Height:   Ht Readings from Last 1 Encounters:  08/20/21 5\' 9"  (1.753 m)    Weight:   Wt Readings from Last 1 Encounters:  08/24/21  84.7 kg    Ideal Body Weight:  72.7 kg  BMI:  Body mass index is 27.58 kg/m.  Estimated Nutritional Needs:   Kcal:  2000-2200  Protein:  95-110 grams  Fluid:  > 2 L    Loistine Chance, RD, LDN, [place] Registered Dietitian II Certified Diabetes Care and Education Specialist Please refer to [facility] for RD and/or RD on-call/weekend/after hours pager

## 2021-08-24 NOTE — Progress Notes (Signed)
Patient ID: Anthony Dudley, male   DOB: [birth year], 87 y.o.   MRN: [id number]  6 Days Post-Op Subjective: In restraints.  Still not responsive to questions.  Poor appetite per brother.  Objective: Vital signs in last 24 hours: Temp:  [97.7 F (36.5 C)-98.5 F (36.9 C)] 98 F (36.7 C) (10/11 0254) Pulse Rate:  [76-101] 76 (10/11 0254) Resp:  [18-24] 18 (10/11 0254) BP: (111-119)/(60-71) 111/60 (10/11 0254) SpO2:  [100 %] 100 % (10/11 0254) Weight:  [84.7 kg] 84.7 kg (10/11 0011)  Intake/Output from previous day: 10/10 0701 - 10/11 0700 In: 2306.8 [P.O.:168; I.V.:1918.8; IV Piggyback:200] Out: 3150 [Urine:3150] Intake/Output this shift: Total I/O In: 2168.8 [P.O.:50; I.V.:1918.8; IV Piggyback:200] Out: 1175 [Urine:1175]  Physical Exam:  General: Alert and oriented GU: B PCNs in place draining mostly clear urine now, urethral catheter with small amount of mostly clear urine.   Lab Results: Recent Labs    08/22/21 1641 08/23/21 0313 08/24/21 0242  HGB 8.5* 8.1* 8.5*  HCT 26.3* 25.2* 26.2*   CBC Latest Ref Rng & Units 08/24/2021 08/23/2021 08/22/2021  WBC 4.0 - 10.5 K/uL 10.1 12.0(H) -  Hemoglobin 13.0 - 17.0 g/dL 8.5(L) 8.1(L) 8.5(L)  Hematocrit 39.0 - 52.0 % 26.2(L) 25.2(L) 26.3(L)  Platelets 150 - 400 K/uL 83(L) 86(L) -     BMET Recent Labs    08/23/21 0313 08/24/21 0242  NA 142 140  K 3.6 3.5  CL 110 109  CO2 21* 21*  GLUCOSE 110* 95  BUN 54* 49*  CREATININE 4.34* 3.77*  CALCIUM 8.7* 8.5*     Studies/Results: No results found.  Assessment/Plan: Bilateral hydronephrosis/AKI/sepsis: Continue antibiotic therapy/supportive care.  Cultures have not been helpful but would treat empirically for pyelonephritis. S/P bilateral nephrostomy tube placement.  Renal function now improving.  Would ask IR to consider antegrade placement of ureteral stents once he is felt to be stable enough for that.  Metastatic prostate cancer: PSA significantly elevated consistent  with probable widespread metastatic prostate cancer. CT scan without lymphadenopathy but with extensive sclerotic bone metastases.  Will consider bone scan once acute issues stabilized to further assess extent of disease.  Will need treatment escalation eventually with androgen receptor blockade/androgen synthesis inhibition as outpatient.  Started ADT with degarelix on 10/6. Urethral erosion/urinary retention:  Continue Foley for now.  Will consider SP tube placement eventually (likely at time of next stent change when he gets general anesthesia).  Will likely have minimal output from urethral catheter with nephrostomy tubes in place until antegrade stents placed.  Patients brother and POA, Anthony Dudley, has been updated on urologic situation.  No new recommendations until antegrade ureteral stents.     LOS: 6 days   Dutch Gray 08/24/2021, 6:49 AM

## 2021-08-25 DIAGNOSIS — Z515 Encounter for palliative care: Secondary | ICD-10-CM

## 2021-08-25 DIAGNOSIS — E872 Acidosis, unspecified: Secondary | ICD-10-CM | POA: Diagnosis not present

## 2021-08-25 DIAGNOSIS — A419 Sepsis, unspecified organism: Secondary | ICD-10-CM | POA: Diagnosis not present

## 2021-08-25 DIAGNOSIS — N390 Urinary tract infection, site not specified: Secondary | ICD-10-CM

## 2021-08-25 DIAGNOSIS — E440 Moderate protein-calorie malnutrition: Secondary | ICD-10-CM | POA: Diagnosis present

## 2021-08-25 DIAGNOSIS — Z7189 Other specified counseling: Secondary | ICD-10-CM

## 2021-08-25 DIAGNOSIS — N179 Acute kidney failure, unspecified: Secondary | ICD-10-CM | POA: Diagnosis not present

## 2021-08-25 DIAGNOSIS — C61 Malignant neoplasm of prostate: Secondary | ICD-10-CM | POA: Diagnosis not present

## 2021-08-25 DIAGNOSIS — T83511A Infection and inflammatory reaction due to indwelling urethral catheter, initial encounter: Secondary | ICD-10-CM

## 2021-08-25 LAB — MAGNESIUM: Magnesium: 1.9 mg/dL (ref 1.7–2.4)

## 2021-08-25 LAB — COMPREHENSIVE METABOLIC PANEL
ALT: 7 U/L (ref 0–44)
AST: 11 U/L — ABNORMAL LOW (ref 15–41)
Albumin: 2 g/dL — ABNORMAL LOW (ref 3.5–5.0)
Alkaline Phosphatase: 378 U/L — ABNORMAL HIGH (ref 38–126)
Anion gap: 10 (ref 5–15)
BUN: 43 mg/dL — ABNORMAL HIGH (ref 8–23)
CO2: 18 mmol/L — ABNORMAL LOW (ref 22–32)
Calcium: 8.2 mg/dL — ABNORMAL LOW (ref 8.9–10.3)
Chloride: 108 mmol/L (ref 98–111)
Creatinine, Ser: 3.37 mg/dL — ABNORMAL HIGH (ref 0.61–1.24)
GFR, Estimated: 17 mL/min — ABNORMAL LOW (ref >60)
Glucose, Bld: 88 mg/dL (ref 70–99)
Potassium: 2.9 mmol/L — ABNORMAL LOW (ref 3.5–5.1)
Sodium: 136 mmol/L (ref 135–145)
Total Bilirubin: 0.8 mg/dL (ref 0.3–1.2)
Total Protein: 6 g/dL — ABNORMAL LOW (ref 6.5–8.1)

## 2021-08-25 LAB — CBC WITH DIFFERENTIAL/PLATELET
Abs Immature Granulocytes: 0.4 10*3/uL — ABNORMAL HIGH (ref 0.00–0.07)
Basophils Absolute: 0 10*3/uL (ref 0.0–0.1)
Basophils Relative: 0 %
Eosinophils Absolute: 0.3 10*3/uL (ref 0.0–0.5)
Eosinophils Relative: 2 %
HCT: 26.5 % — ABNORMAL LOW (ref 39.0–52.0)
Hemoglobin: 8.6 g/dL — ABNORMAL LOW (ref 13.0–17.0)
Immature Granulocytes: 4 %
Lymphocytes Relative: 22 %
Lymphs Abs: 2.5 10*3/uL (ref 0.7–4.0)
MCH: 31 pg (ref 26.0–34.0)
MCHC: 32.5 g/dL (ref 30.0–36.0)
MCV: 95.7 fL (ref 80.0–100.0)
Monocytes Absolute: 0.6 10*3/uL (ref 0.1–1.0)
Monocytes Relative: 6 %
Neutro Abs: 7.2 10*3/uL (ref 1.7–7.7)
Neutrophils Relative %: 66 %
Platelets: 74 10*3/uL — ABNORMAL LOW (ref 150–400)
RBC: 2.77 MIL/uL — ABNORMAL LOW (ref 4.22–5.81)
RDW: 18.5 % — ABNORMAL HIGH (ref 11.5–15.5)
WBC: 10.9 10*3/uL — ABNORMAL HIGH (ref 4.0–10.5)
nRBC: 0 % (ref 0.0–0.2)

## 2021-08-25 LAB — PHOSPHORUS: Phosphorus: 3.4 mg/dL (ref 2.5–4.6)

## 2021-08-25 MED ORDER — SODIUM CHLORIDE 0.9 % IV SOLN: INTRAVENOUS | Status: DC

## 2021-08-25 MED ORDER — POTASSIUM CHLORIDE 20 MEQ PO PACK
40.0000 meq | PACK | Freq: Three times a day (TID) | ORAL | Status: AC
  Administered 2021-08-25 – 2021-08-26 (×6): 40 meq via ORAL
  Filled 2021-08-25 (×6): qty 2

## 2021-08-25 NOTE — Progress Notes (Signed)
Pharmacy Antibiotic Note  Anthony Dudley is a 87 y.o. male admitted on 08/17/2021 with AKI on CKD III, metabolic acidosis, sepsis, UTI (indwelling catheter), prostate cancer. Pharmacy has been consulted for Vancomycin  Day# 5/5 for pneumonia and Cefepime D# 6/73 for sepsis 2/2 UTI; per provider notes, pt has prior cx with MDR Proteus susceptible to cefepime.    Sepsis UTI/ CAP:  Cefepime for UTI,   Vancomycin added on 10/6 for pneumonia.  Vancomycin dosing has been variable based on random levels and renal function due to his AKI on CKD II.  LA 1.1>0.9.    Afeb, wbc 14.3>12>10.1>10.9 MRSA PCR : positive -UOP improved s/p bilateral nephrostomy tube placement per IR on 10/6.   AKI on CKD III - SCr  peaked 6.62>>trending down , 3.77 >3.37 ( BL SCr 2.5 on 07/09/21 reportedly). Estimated CrCl = 15.7 ml/min , UOP 2100 ml/~ 24hr  Discussed duration of antibiotics with Dr. Florene Glen yesterday 10/11 and he wants total 7 days for both cefepime and vancomycin.  Cefepime completed after dose on 08/24/21. Vancomycin 1000 mg IV x1 was last given on 10/10 ~22:00 which should cover thru 10/12 to complete 7 days total of vancomycin, as patient only needs a dose ~q48 hr -q60h.   Plan: Has completed 7 days of Cefepime and Vancomycin.  Pharmacy will sign off.   Height: 5\' 9"  (175.3 cm) (unable to stand, approximate measurement lying in bed.) Weight: 83.1 kg (183 lb 3.2 oz) IBW/kg (Calculated) : 70.7  Temp (24hrs), Avg:97.7 F (36.5 C), Min:97.5 F (36.4 C), Max:98.1 F (36.7 C)  Recent Labs  Lab 08/20/21 0425 08/20/21 0635 08/20/21 2340 08/21/21 0432 08/22/21 0204 08/22/21 1641 08/23/21 0313 08/23/21 1250 08/24/21 0242 08/25/21 0300  WBC  --   --  16.7*  --  14.3*  --  12.0*  --  10.1 10.9*  CREATININE  --   --  5.98*   < > 5.28* 4.65* 4.34*  --  3.77* 3.37*  LATICACIDVEN 1.0 0.9  --   --   --   --   --   --   --   --   VANCORANDOM  --   --   --    < > 35  --   --  22  --   --    < > = values in this  interval not displayed.     Estimated Creatinine Clearance: 15.7 mL/min (A) (by C-G formula based on SCr of 3.37 mg/dL (H)).    Allergies  Allergen Reactions   Solanum Lycopersicum [Tomato]     Patient reports he is not allergic to tomato and he eats them all the time    Antimicrobials this admission: Cefepime 10/5 >>10/11 Cefazolin X 1 for procedure 10/6 Vancomycin 10/6 >>10/12  Levels and dose adjustments: 10/8 VR 19 ,  redose 1250 mg x1 10/8  10/9 VR 35, no dose  10/10 VR 22 ,  gave Vancomycin 1000 mg x1 @ 22:00 on 08/23/21  Microbiology results: 10/5 BCx - negative 10/5 UC - negative  10/6 MRSA PCR: positive  Thank you for allowing pharmacy to participate in this patient's care.  Nicole Cella, RPh Clinical Pharmacist [phone] 08/25/2021 8:02 AM Check [email or web address] for unit specific pharmacy number

## 2021-08-25 NOTE — Consult Note (Signed)
Consultation Note Date: 08/25/2021   Patient Name: Anthony Dudley  DOB: [birth year]  MRN: [id number]  Age / Sex: 87 y.o., male  PCP: [name], MD Referring Physician: Barb Merino, MD  Reason for Consultation: Establishing goals of care  HPI/Patient Profile: 87 y.o. male  with past medical history of metastatic prostate cancer, chronic indwelling foley, CKD stage III, hx MDR UTI, dementia, chronic bedbound status, HFpEF, and HTN admitted on 08/17/2021 with AKI on CKD. Urology was consulted due to hydronephrosis.  IR was consulted for bilateral nephrostomy tubes which were placed on 10/6. Renal function has improved however he remains debilitated with poor PO intake. Plan for eventual conversion to  internal stents and suprapubic catheter. Concerns from medical team about quality of life and ability to rehabilitate. PMT consulted to for goals of care discussion. Per chart review, brother has consistently requested full code/full scope care.  Clinical Assessment and Goals of Care: I have reviewed medical records including EPIC notes, labs and imaging, received report from RN, assessed the patient and then spoke with patient's brother Suezanne Jacquet  to discuss diagnosis prognosis, GOC, EOL wishes, disposition and options.  Per RN and NT, patient consistently refuses PO intake - took in a few bites and sips today after much coaxing from nursing staff. Consistently requests to leave.   On my evaluation, patient is in mitts for safety - asking to have them removed. Asks me multiple times what is going on - attempted explanation but then he follows up asking the same question. Explained to patient I would call his brother and he nodded.   During conversation with brother, I introduced Palliative Medicine as specialized medical care for people living with serious illness. The goal is to improve quality of life for both the patient and the family.  Brother  initiates conversation by explaining that if this conversation was about convincing him to change patient to "no code" he would not do it. I explained that it was not my goal to convince him of anything but to ensure that patient's medical care aligns with his goals of care.   Brother shares that patient lives at [facility] - tells me patient has been nonambulatory for about a month. Tells me patient has had poor PO intake for about 2 months. Shares about cognitive decline - tells me patient always knows who brother is but does not remember that he has visited.    We discussed patient's current illness and what it means in the larger context of patient's on-going co-morbidities. Brother has good understanding of current medical situation.   I attempted to elicit values and goals of care important to the patient.  Brother shares this is not something the patient has ever shared with him.   Brother brings up his frustration that the medical team seems to be focused on changing patient to "no code". He tells me believes patient will die when changes him to "no code". I explained that was not necessarily true and a change to DNR would not change his current level of care - would only change how the medical team responded if patient died. I explained that medical team had concerns about patient's quality of life and how he would tolerate ongoing aggressive care.   Explained to brother that we would not continue discussions but would certainly be available if our involvement were requested.  Also explained that I left a "Hard Choices for Loving People" booklet at patient's bedside - explained this booklet reviewed many different medical  decisions brother may be faced with in the future.   SUMMARY OF RECOMMENDATIONS   - initial discussion - brother interested in full code/full scope care, not interested in goals of care/quality of life/code status discussions - hard choices booklet left at bedside for review -  discussed with Dr. Sloan Leiter - PMT will not follow, please reach out if brother is interested in speaking to our team further  Code Status/Advance Care Planning: Full code     Primary Diagnoses: Present on Admission:  Acute-on-chronic kidney injury [facility])   I have reviewed the medical record, interviewed the patient and family, and examined the patient. The following aspects are pertinent.  History reviewed. No pertinent past medical history. Social History   Socioeconomic History   Marital status: Single    Spouse name: Not on file   Number of children: Not on file   Years of education: Not on file   Highest education level: Not on file  Occupational History   Not on file  Tobacco Use   Smoking status: Not on file   Smokeless tobacco: Not on file  Substance and Sexual Activity   Alcohol use: Not on file   Drug use: Not on file   Sexual activity: Not on file  Other Topics Concern   Not on file  Social History Narrative   Not on file   Social Determinants of Health   Financial Resource Strain: Not on file  Food Insecurity: Not on file  Transportation Needs: Not on file  Physical Activity: Not on file  Stress: Not on file  Social Connections: Not on file   History reviewed. No pertinent family history. Scheduled Meds:  sodium chloride   Intravenous Once   sodium chloride   Intravenous Once   acetaminophen  1,000 mg Oral Q8H   Chlorhexidine Gluconate Cloth  6 each Topical Daily   magnesium oxide  400 mg Oral BID   multivitamin with minerals  1 tablet Oral Daily   oxybutynin  5 mg Oral QHS   potassium chloride  40 mEq Oral TID   Continuous Infusions:  sodium chloride 100 mL/hr at 08/25/21 1256   lactated ringers     sodium chloride irrigation     PRN Meds:.acetaminophen **FOLLOWED BY** acetaminophen, fentaNYL (SUBLIMAZE) injection, food thickener, lidocaine (PF), LORazepam, metoprolol tartrate, midazolam Allergies  Allergen Reactions   Solanum Lycopersicum  [Tomato]     Patient reports he is not allergic to tomato and he eats them all the time   Review of Systems  Unable to perform ROS: Dementia   Physical Exam Constitutional:      General: He is not in acute distress. Pulmonary:     Effort: Pulmonary effort is normal.  Skin:    General: Skin is warm and dry.  Neurological:     Mental Status: He is alert. He is disoriented.    Vital Signs: BP (!) 102/51   Pulse 74   Temp 98.1 F (36.7 C) (Oral)   Resp 20   Ht 5\' 9"  (1.753 m) Comment: unable to stand, approximate measurement lying in bed.  Wt 83.1 kg   SpO2 100%   BMI 27.05 kg/m  Pain Scale: 0-10   Pain Score: 0-No pain   SpO2: SpO2: 100 % O2 Device:SpO2: 100 % O2 Flow Rate: .O2 Flow Rate (L/min): 4 L/min  IO: Intake/output summary:  Intake/Output Summary (Last 24 hours) at 08/25/2021 1353 Last data filed at 08/25/2021 1254 Gross per 24 hour  Intake 253 ml  Output 1750 ml  Net -1497 ml    LBM: Last BM Date: 08/23/21 Baseline Weight: Weight: 80.1 kg Most recent weight: Weight: 83.1 kg     Palliative Assessment/Data: PPS 20%    Time Total: 55 minutes Greater than 50%  of this time was spent counseling and coordinating care related to the above assessment and plan.  Juel Burrow, DNP, AGNP-C Palliative Medicine Team [phone] Pager: [phone]

## 2021-08-25 NOTE — Plan of Care (Signed)

## 2021-08-25 NOTE — Progress Notes (Signed)
PROGRESS NOTE    Anthony Dudley  CNO:[id number] DOB: 1933-09-06 DOA: 08/17/2021 PCP: [name], MD    Brief Narrative:  87 yo with hx of metastatic prostate cancer, chronic indwelling foley, CKD stage III, hx MDR UTI, dementia, chronic bedbound status, HFpEF, HTN and multiple other medical problems presenting with AKI on CKD from the nursing home.    Creatinine reportedly 2.5 on 8/26.   Cr reportedly 6.1 on 10/3.  He was recently transfused with a HB of 6.6 at [facility] on 08/10/21.     He was admitted with sepsis 2/2 UTI and AKI on CKD.  Urology was consulted due to hydronephrosis.  He had cystoscopy and fulguration of bladder on 10/5 with failed attempt at stent placement.  IR was consulted for bilateral nephrostomy tubes which were placed on 10/6.  His hospitalization was complicated by SVT and he was started on amiodarone and cardiology and PCCM were consulted due to hypotension. His Renal functions gradually improving but remains very debilitated and frail with failure to thrive.  Urology continuing to follow.  His brother has been involved in care and wants to be full code.   Assessment & Plan:   Principal Problem:   Acute-on-chronic kidney injury ([place]) Active Problems:   Metabolic acidosis   Sepsis ([place])   UTI (urinary tract infection) due to urinary indwelling catheter (HCC)   Prostate cancer (HCC)  Acute kidney injury on chronic kidney disease stage III AAA with bilateral hydronephrosis/metabolic acidosis/hypernatremia: Reported baseline creatinine 2.5 presented with creatinine of 6.1. Unable to do cystoscopy and ureteroscopy, status post bilateral percutaneous nephrostomy by interventional radiology. Some improvement in kidney functions, however patient overall remains in very poor and debilitated condition.  Followed by urology for possible conversion to internal stents and suprapubic catheter placement. Patient does not have good oral intake.  Restart on  isotonic fluid today.  Sepsis secondary to UTI present on admission due to indwelling Foley catheter, bilateral multifocal pneumonia: Presented with tachypnea, tachycardia and leukocytosis.  Prior history of MDR Proteus sensitive to cefepime. Treated with cefepime and vancomycin, completed 7 days of therapy to cover for both UTI and pneumonia. Foley exchanged in the emergency room.  Completed antibiotics.  Acute on chronic anemia of blood loss: Patient with multiple chronic illnesses.  Hemoglobin 6.6 on 9/27, 2 units of PRBC transfusion.  Remains a stable since then.  Acute hypoxemic respiratory failure: With severe multilobular bilateral bronchopneumonia and also suspect renal failure.  Completed antibiotic therapy.  Maintenance IV fluids.  Keep on oxygen to keep saturation more than 90%.  Echocardiogram with normal ejection fraction.  Hypokalemia: Replace aggressively and monitor levels.  Magnesium is adequate.  SVT/hypotension: Currently in sinus rhythm.  Initially started on amiodarone that was discontinued.  Echocardiogram with normal ejection fraction.  TSH normal.  Blood pressures and heart rate normalized.  Failure to thrive/metastatic prostate cancer/bedbound status/severe protein calorie malnutrition Patient with advanced debility and frailty.  He has poor quality of life and less likely to do a meaningful recovery and rehab. Discussed and updated patient's brother on the phone.  Current plan is to continue to try and give him reasonable time to recover. I discussed with patient's brother that I am consulting palliative care team to have more coordination of care, get education and also learn how to deal with chronic illnesses.  He is agreeable to discussed with palliative care team. In my opinion, if agreeable, he is a good candidate to be served by hospice level of care.  DVT prophylaxis: Place and maintain sequential compression device Start: 08/21/21 0754 SCDs Start: 08/18/21  0235   Code Status: Full code Family Communication: Brother on the phone Disposition Plan: Status is: Inpatient  Remains inpatient appropriate because:Persistent severe electrolyte disturbances, IV treatments appropriate due to intensity of illness or inability to take PO, and Inpatient level of care appropriate due to severity of illness  Dispo: The patient is from: SNF              Anticipated d/c is to: SNF              Patient currently is not medically stable to d/c.   Difficult to place patient No         Consultants:  Urology PCCM Cardiology Palliative care Interventional radiology  Procedures:  Cystoscopy Bilateral percutaneous nephrostomy  Antimicrobials:  Vancomycin and cefepime, completed therapy.   Subjective: Patient seen and examined.  Pleasantly confused.  Agitated at times.  He just repeats "what is wrong with me" nursing reported occasional agitation.  Family reported not eating.  Objective: Vitals:   08/24/21 1600 08/24/21 1919 08/25/21 0020 08/25/21 0400  BP: (!) 99/51 (!) 97/50 (!) 100/52 (!) 102/51  Pulse: 71 73 72 74  Resp: 20 20 18 20   Temp:  (!) 97.5 F (36.4 C) 97.6 F (36.4 C) 98.1 F (36.7 C)  TempSrc:  Oral Oral Oral  SpO2: 100% 100% 100% 100%  Weight:   83.1 kg   Height:        Intake/Output Summary (Last 24 hours) at 08/25/2021 1234 Last data filed at 08/25/2021 0600 Gross per 24 hour  Intake 168 ml  Output 1750 ml  Net -1582 ml   Filed Weights   08/22/21 2345 08/24/21 0011 08/25/21 0020  Weight: 84.8 kg 84.7 kg 83.1 kg    Examination:  General exam: Appears frail and debilitated , sick looking alert and awake but not oriented .  Respiratory system: conducted airway sounds . On 2 liters nasal canula  Cardiovascular system: S1 & S2 heard, RRR. No pedal edema. Gastrointestinal system: Abdomen is nondistended, soft and nontender. No organomegaly or masses felt. Normal bowel sounds heard. R PCN with clear urine Left  PCN with pink urine  Foley with small amount of clear urine .    Data Reviewed: I have personally reviewed following labs and imaging studies  CBC: Recent Labs  Lab 08/20/21 0308 08/20/21 1250 08/20/21 2340 08/21/21 2010 08/22/21 0204 08/22/21 1641 08/23/21 0313 08/24/21 0242 08/25/21 0300  WBC 17.2*  --  16.7*  --  14.3*  --  12.0* 10.1 10.9*  NEUTROABS 15.8*  --  13.9*  --   --   --  9.4* 6.7 7.2  HGB 6.6*   < > 8.4*  8.4*   < > 8.0* 8.5* 8.1* 8.5* 8.6*  HCT 20.5*   < > 25.6*  25.3*   < > 24.7* 26.3* 25.2* 26.2* 26.5*  MCV 101.5*  --  93.8  --  93.2  --  95.8 94.2 95.7  PLT 129*  --  93*  --  88*  --  86* 83* 74*   < > = values in this interval not displayed.   Basic Metabolic Panel: Recent Labs  Lab 08/20/21 0308 08/20/21 2340 08/21/21 1122 08/21/21 2010 08/22/21 0204 08/22/21 1641 08/23/21 0313 08/24/21 0242 08/25/21 0300  NA 146* 148*   < > 149* 147* 144 142 140 136  K 4.8 3.7   < > 3.2* 3.1*  3.5 3.6 3.5 2.9*  CL 112* 113*   < > 115* 114* 114* 110 109 108  CO2 17* 19*   < > 17* 20* 17* 21* 21* 18*  GLUCOSE 111* 99   < > 177* 140* 132* 110* 95 88  BUN 78* 73*   < > 69* 64* 58* 54* 49* 43*  CREATININE 6.35* 5.98*   < > 5.46* 5.28* 4.65* 4.34* 3.77* 3.37*  CALCIUM 8.3* 8.4*   < > 8.4* 8.7* 8.5* 8.7* 8.5* 8.2*  MG 1.6* 1.7  --  1.8  --   --  1.6* 1.6* 1.9  PHOS 4.2 4.3  --   --   --   --  3.0 3.2 3.4   < > = values in this interval not displayed.   GFR: Estimated Creatinine Clearance: 15.7 mL/min (A) (by C-G formula based on SCr of 3.37 mg/dL (H)). Liver Function Tests: Recent Labs  Lab 08/20/21 0308 08/20/21 2340 08/23/21 0313 08/24/21 0242 08/25/21 0300  AST 24 17 15  12* 11*  ALT 7 7 6  <5 7  ALKPHOS 294* 269* 280* 285* 378*  BILITOT 0.9 0.9 0.7 0.4 0.8  PROT 6.0* 6.0* 6.1* 5.8* 6.0*  ALBUMIN 2.2* 2.2* 2.0* 2.0* 2.0*   No results for input(s): LIPASE, AMYLASE in the last 168 hours. No results for input(s): AMMONIA in the last 168  hours. Coagulation Profile: No results for input(s): INR, PROTIME in the last 168 hours. Cardiac Enzymes: No results for input(s): CKTOTAL, CKMB, CKMBINDEX, TROPONINI in the last 168 hours. BNP (last 3 results) No results for input(s): PROBNP in the last 8760 hours. HbA1C: No results for input(s): HGBA1C in the last 72 hours. CBG: No results for input(s): GLUCAP in the last 168 hours. Lipid Profile: No results for input(s): CHOL, HDL, LDLCALC, TRIG, CHOLHDL, LDLDIRECT in the last 72 hours. Thyroid Function Tests: No results for input(s): TSH, T4TOTAL, FREET4, T3FREE, THYROIDAB in the last 72 hours. Anemia Panel: No results for input(s): VITAMINB12, FOLATE, FERRITIN, TIBC, IRON, RETICCTPCT in the last 72 hours. Sepsis Labs: Recent Labs  Lab 08/20/21 0425 08/20/21 6073  LATICACIDVEN 1.0 0.9    Recent Results (from the past 240 hour(s))  Urine Culture     Status: Abnormal   Collection Time: 08/17/21 11:16 PM   Specimen: Urine, Catheterized  Result Value Ref Range Status   Specimen Description URINE, CATHETERIZED  Final   Special Requests   Final    NONE Performed at [facility], [address] [place], [place] [address]    Culture MULTIPLE SPECIES PRESENT, SUGGEST RECOLLECTION (A)  Final   Report Status 08/18/2021 FINAL  Final  Resp Panel by RT-PCR (Flu A&B, Covid) Nasopharyngeal Swab     Status: None   Collection Time: 08/17/21 11:19 PM   Specimen: Nasopharyngeal Swab; Nasopharyngeal(NP) swabs in vial transport medium  Result Value Ref Range Status   SARS Coronavirus 2 by RT PCR NEGATIVE NEGATIVE Final    Comment: (NOTE) SARS-CoV-2 target nucleic acids are NOT DETECTED.  The SARS-CoV-2 RNA is generally detectable in upper respiratory specimens during the acute phase of infection. The lowest concentration of SARS-CoV-2 viral copies this assay can detect is 138 copies/mL. A negative result does not preclude SARS-Cov-2 infection and should not be used as the sole  basis for treatment or other patient management decisions. A negative result may occur with  improper specimen collection/handling, submission of specimen other than nasopharyngeal swab, presence of viral mutation(s) within the areas targeted by  this assay, and inadequate number of viral copies(<138 copies/mL). A negative result must be combined with clinical observations, patient history, and epidemiological information. The expected result is Negative.  Fact Sheet for Patients:  EntrepreneurPulse.com.au  Fact Sheet for Healthcare Providers:  IncredibleEmployment.be  This test is no t yet approved or cleared by the [place] FDA and  has been authorized for detection and/or diagnosis of SARS-CoV-2 by FDA under an Emergency Use Authorization (EUA). This EUA will remain  in effect (meaning this test can be used) for the duration of the COVID-19 declaration under Section 564(b)(1) of the Act, 21 U.S.C.section 360bbb-3(b)(1), unless the authorization is terminated  or revoked sooner.       Influenza A by PCR NEGATIVE NEGATIVE Final   Influenza B by PCR NEGATIVE NEGATIVE Final    Comment: (NOTE) The Xpert Xpress SARS-CoV-2/FLU/RSV plus assay is intended as an aid in the diagnosis of influenza from Nasopharyngeal swab specimens and should not be used as a sole basis for treatment. Nasal washings and aspirates are unacceptable for Xpert Xpress SARS-CoV-2/FLU/RSV testing.  Fact Sheet for Patients: EntrepreneurPulse.com.au  Fact Sheet for Healthcare Providers: IncredibleEmployment.be  This test is not yet approved or cleared by the [place] FDA and has been authorized for detection and/or diagnosis of SARS-CoV-2 by FDA under an Emergency Use Authorization (EUA). This EUA will remain in effect (meaning this test can be used) for the duration of the COVID-19 declaration under Section 564(b)(1) of the Act,  21 U.S.C. section 360bbb-3(b)(1), unless the authorization is terminated or revoked.  Performed at [facility], [place] [address] [place], [place] [address]   Culture, blood (routine x 2)     Status: None   Collection Time: 08/18/21  2:55 AM   Specimen: BLOOD  Result Value Ref Range Status   Specimen Description BLOOD RIGHT ANTECUBITAL  Final   Special Requests   Final    BOTTLES DRAWN AEROBIC AND ANAEROBIC Blood Culture adequate volume   Culture   Final    NO GROWTH 5 DAYS Performed at [facility], [place] [address], [place], [place] [address]    Report Status 08/23/2021 FINAL  Final  Culture, blood (routine x 2)     Status: None   Collection Time: 08/18/21  3:01 AM   Specimen: BLOOD  Result Value Ref Range Status   Specimen Description BLOOD BLOOD RIGHT WRIST  Final   Special Requests   Final    BOTTLES DRAWN AEROBIC AND ANAEROBIC Blood Culture adequate volume   Culture   Final    NO GROWTH 5 DAYS Performed at [facility], [place] [address] [place], [place] [address]    Report Status 08/23/2021 FINAL  Final  MRSA Next Gen by PCR, Nasal     Status: Abnormal   Collection Time: 08/19/21  3:50 PM   Specimen: Nasal Mucosa; Nasal Swab  Result Value Ref Range Status   MRSA by PCR Next Gen DETECTED (A) NOT DETECTED Final    Comment: RESULT CALLED TO, READ BACK BY AND VERIFIED WITH: K DUFFY RN [id number] 08/19/21 A BROWNING (NOTE) The GeneXpert MRSA Assay (FDA approved for NASAL specimens only), is one component of a comprehensive MRSA colonization surveillance program. It is not intended to diagnose MRSA infection nor to guide or monitor treatment for MRSA infections. Test performance is not FDA approved in patients less than 65 years old. Performed at [facility], [place] [address], [place], [place] [address]  Radiology Studies: No results found.      Scheduled Meds:  sodium chloride   Intravenous Once   sodium chloride   Intravenous Once    acetaminophen  1,000 mg Oral Q8H   Chlorhexidine Gluconate Cloth  6 each Topical Daily   magnesium oxide  400 mg Oral BID   multivitamin with minerals  1 tablet Oral Daily   oxybutynin  5 mg Oral QHS   potassium chloride  40 mEq Oral TID   Continuous Infusions:  sodium chloride     lactated ringers     sodium chloride irrigation       LOS: 7 days    Time spent: 35 minutes    Barb Merino, MD [facility] Pager [phone]

## 2021-08-25 NOTE — Progress Notes (Addendum)
Nutrition Follow-up  DOCUMENTATION CODES:   Non-severe (moderate) malnutrition in context of chronic illness  INTERVENTION:   -Continue MVI with minerals daily -Continue Magic cup TID with meals, each supplement provides 290 kcal and 9 grams of protein  -Continue double protein portions with meals -Continue feeding assistance with meals   NUTRITION DIAGNOSIS:   Moderate Malnutrition related to chronic illness (dementia) as evidenced by mild fat depletion, moderate fat depletion, mild muscle depletion, moderate muscle depletion.  Ongoing  GOAL:   Patient will meet greater than or equal to 90% of their needs  Progressing   MONITOR:   PO intake, Supplement acceptance, Diet advancement, Labs, Weight trends, Skin, I & O's  REASON FOR ASSESSMENT:   Low Braden    ASSESSMENT:   Anthony Dudley is a 87 y.o. male with medical history significant of prostate cancer, CKD stage III, chronic indwelling Foley catheter, MDR UTI, dementia, bedbound at baseline, chronic diastolic CHF, hypertension, hyperlipidemia COVID infection in June 2020. He was sent to the ED from his nursing home for evaluation of worsening kidney function.  His creatinine was 2.5 on 07/09/21, 5.7 on 08/09/21, and now 6.1 on 08/16/21.  His hemoglobin was 6.6 on 08/10/21 and he received 2 units PRBCs at [facility], hemoglobin 9.0 at his facility on 08/16/21.  10/5- s/p BSE- advanced to dysphagia 1 diet with thin liquids; s/p cystoscopy and fulguration of bladder 10/6- s/p placement of bilateral PCNs 10/7- s/p BSE- downgraded to dysphagia 1 diet with honey thick liquids  Reviewed I/O's: -1.9 L x 24 hours and -16.4 L since admission  UOP: 2.1 L x 24 hours  Per palliative care notes, pt consistently refusing po intake. Noted meal completion 0%.Per brother, pt with poor oral intake for 2 months PTA.   Pt with garbled speech, but able to answer some of RD's questions. He is very fixated on removing his mittens, stating  they make his palms "sweaty". RD attempted to reorient pt to place and situation. Pt was able to tell this RD that he is in the hospital.   Case discussed with MD regarding possibility of placing cortrak. Per MD, pt is likely nearing end of life. He plans to reach out to brother to discuss EOL.    Medications reviewed and include magnesium oxide, potassium chloride, and 0.9% sodium chloride infusion @ 100 ml/hr.   Labs reviewed: K: 2.9.    NUTRITION - FOCUSED PHYSICAL EXAM:  Flowsheet Row Most Recent Value  Orbital Region Moderate depletion  Upper Arm Region Moderate depletion  Thoracic and Lumbar Region Mild depletion  Buccal Region Mild depletion  Temple Region Moderate depletion  Clavicle Bone Region Moderate depletion  Clavicle and Acromion Bone Region Moderate depletion  Scapular Bone Region Moderate depletion  Dorsal Hand Mild depletion  Patellar Region No depletion  Anterior Thigh Region No depletion  Posterior Calf Region No depletion  Edema (RD Assessment) Moderate  Hair Reviewed  Eyes Reviewed  Mouth Reviewed  Skin Reviewed  Nails Reviewed       Diet Order:   Diet Order             DIET - DYS 1 Room service appropriate? Yes; Fluid consistency: Honey Thick  Diet effective now                   EDUCATION NEEDS:   Not appropriate for education at this time  Skin:  Skin Assessment: Skin Integrity Issues: Skin Integrity Issues:: Incisions Incisions: closed penis  Last BM:  08/23/21  Height:   Ht Readings from Last 1 Encounters:  08/20/21 5\' 9"  (1.753 m)    Weight:   Wt Readings from Last 1 Encounters:  08/25/21 83.1 kg    Ideal Body Weight:  72.7 kg  BMI:  Body mass index is 27.05 kg/m.  Estimated Nutritional Needs:   Kcal:  2000-2200  Protein:  95-110 grams  Fluid:  > 2 L    Loistine Chance, RD, LDN, [place] Registered Dietitian II Certified Diabetes Care and Education Specialist Please refer to [facility] for RD and/or RD  on-call/weekend/after hours pager

## 2021-08-25 NOTE — Progress Notes (Signed)
Referring Physician(s): * No referring provider recorded for this case *  Supervising Physician: Corrie Mckusick  Patient Status:  [facility] - In-pt  Chief Complaint:  History of prostate cancer with chronic indwelling Foley, CKD, UTI, dementia, hypertension presented with urosepsis.  Patient found to have AKI on CKD with bilateral hydronephrosis.  Urology was unable to place retrograde ureteral stent. IR placed bilateral nephrostomy tubes for hydronephrosis 08/19/2021.  Subjective:  Patient observed to be resting in bed. He is watching tv. Due to history of dementia patient is poor historian.    -Right nephrostomy tube insertion site has slight erythema with no drainage, bleeding or other s/sx of infection. Sutures and StatLock in place.  No bleeding, drainage or other signs of infection noted.  Right gravity bag has 200 mL clear, yellow urine with 1525 mL documented in epic for the past 24 hours. Dressing has small amount of dried blood.  -Left nephrostomy tube insertion site unremarkable with sutures and StatLock in place.  No redness, drainage or other signs of infection noted.  Left gravity bag has approximately scant amount mL red-colored urine with 575 mL documented in epic for the past 24 hours. There is approximately 100 mL red-colored urine from bag drained onto sheets where drain was left open. Dressing has small amount of dried blood.  Allergies: Solanum lycopersicum [tomato]  Medications: Prior to Admission medications   Medication Sig Start Date End Date Taking? Authorizing Provider  Cholecalciferol (VITAMIN D-3) 125 MCG (5000 UT) TABS Take 5,000 Units by mouth daily.   Yes [provider]  ipratropium-albuterol (DUONEB) 0.5-2.5 (3) MG/3ML SOLN Take 3 mLs by nebulization every 4 (four) hours as needed (shortness of breath).   Yes [provider]  metoprolol tartrate (LOPRESSOR) 25 MG tablet Take 12.5 mg by mouth 2 (two) times daily.   Yes [provider]  Multiple Vitamins-Iron (MULTI-VITAMIN/IRON) TABS Take 1 tablet by mouth in the morning and at bedtime.   Yes [provider]  niacinamide 500 MG tablet Take 500 mg by mouth daily.   Yes [provider]  Nutritional Supplements (BOOST BREEZE PO) Take 1 Bottle by mouth 3 (three) times daily.   Yes [provider]  OXYGEN Inhale into the lungs See admin instructions. Put on oxygen if O2 is less than 90% every shift   Yes [provider]  sodium bicarbonate 650 MG tablet Take 650 mg by mouth daily.   Yes [provider]  tamsulosin (FLOMAX) 0.4 MG CAPS capsule Take 0.4 mg by mouth daily.   Yes [provider]  tolterodine (DETROL) 1 MG tablet Take 1 mg by mouth 2 (two) times daily.   Yes [provider]  vitamin B-12 (CYANOCOBALAMIN) 1000 MCG tablet Take 2,000 mcg by mouth daily.   Yes [provider]     Vital Signs: BP (!) 102/51   Pulse 74   Temp 98.1 F (36.7 C) (Oral)   Resp 20   Ht 5\' 9"  (1.753 m) Comment: unable to stand, approximate measurement lying in bed.  Wt 183 lb 3.2 oz (83.1 kg)   SpO2 100%   BMI 27.05 kg/m   Physical Exam Vitals reviewed.  Constitutional:      Appearance: He is ill-appearing.  HENT:     Head: Normocephalic and atraumatic.  Cardiovascular:     Rate and Rhythm: Normal rate.  Pulmonary:     Effort: Pulmonary effort is normal.  Genitourinary:    Comments: Foley catheter in place  Bilateral nephrostomy tubes in place  L C/D/I R has mild erythema to insertion site- no bleeding, drainage or other s/sx of infection Neurological:     Mental Status: He is disoriented.    Imaging: ECHOCARDIOGRAM COMPLETE  Result Date: 08/21/2021    ECHOCARDIOGRAM REPORT   Patient Name:   Anthony Dudley Date of Exam: 08/21/2021 Medical Rec #:  [id number]      Height:       69.0 in Accession #:    [id number]     Weight:       180.8 lb Date of Birth:  11/04/1933      BSA:          1.979 m Patient Age:     87 years       BP:           122/78 mmHg Patient Gender: M              HR:           110 bpm. Exam Location:  Inpatient Procedure: 2D Echo, Color Doppler and Cardiac Doppler Indications:    Ventricular Tachycardia I47.2  History:        Patient has no prior history of Echocardiogram examinations.  Sonographer:    Bernadene Person RDCS Referring Phys: [id number] [place] Comments: Image acquisition challenging due to patient behavioral factors. and Image acquisition challenging due to uncooperative patient. IMPRESSIONS  1. Left ventricular ejection fraction, by estimation, is 55 to 60%. The left ventricle has normal function. The left ventricle has no regional wall motion abnormalities. There is mild left ventricular hypertrophy. Left ventricular diastolic parameters are indeterminate.  2. Right ventricular systolic function was not well visualized. The right ventricular size is not well visualized. There is normal pulmonary artery systolic pressure.  3. The mitral valve is normal in structure. Trivial mitral valve regurgitation. No evidence of mitral stenosis.  4. The aortic valve was not well visualized. Aortic valve regurgitation is not visualized. Mild to moderate aortic valve sclerosis/calcification is present, without any evidence of aortic stenosis. FINDINGS  Left Ventricle: Left ventricular ejection fraction, by estimation, is 55 to 60%. The left ventricle has normal function. The left ventricle has no regional wall motion abnormalities. The left ventricular internal cavity size was normal in size. There is  mild left ventricular hypertrophy. Left ventricular diastolic parameters are indeterminate. Right Ventricle: The right ventricular size is not well visualized. Right vetricular wall thickness was not well visualized. Right ventricular systolic function was not well visualized. There is normal pulmonary artery systolic pressure. The tricuspid regurgitant velocity is 2.36 m/s, and with  an assumed right atrial pressure of 3 mmHg, the estimated right ventricular systolic pressure is 00.3 mmHg. Left Atrium: Left atrial size was normal in size. Right Atrium: Right atrial size was normal in size. Pericardium: There is no evidence of pericardial effusion. Mitral Valve: The mitral valve is normal in structure. Trivial mitral valve regurgitation. No evidence of mitral valve stenosis. Tricuspid Valve: The tricuspid valve is normal in structure. Tricuspid valve regurgitation is trivial. Aortic Valve: The aortic valve was not well visualized. Aortic valve regurgitation is not visualized. Mild to moderate aortic valve sclerosis/calcification is present, without any evidence of aortic stenosis. Pulmonic Valve: The pulmonic valve was not well visualized. Pulmonic valve regurgitation is not visualized. Aorta: The aortic root and ascending aorta are structurally normal, with no evidence of dilitation. IAS/Shunts: The interatrial septum was not well visualized.  LEFT VENTRICLE PLAX 2D  LVIDd:         3.60 cm LVIDs:         2.60 cm LV PW:         0.90 cm LV IVS:        1.10 cm LVOT diam:     2.20 cm LV SV:         71 LV SV Index:   36 LVOT Area:     3.80 cm  RIGHT VENTRICLE TAPSE (M-mode): 1.6 cm LEFT ATRIUM             Index        RIGHT ATRIUM           Index LA diam:        3.10 cm 1.57 cm/m   RA Area:     11.30 cm LA Vol (A2C):   30.6 ml 15.46 ml/m  RA Volume:   22.60 ml  11.42 ml/m LA Vol (A4C):   31.3 ml 15.81 ml/m LA Biplane Vol: 31.1 ml 15.71 ml/m  AORTIC VALVE LVOT Vmax:   111.00 cm/s LVOT Vmean:  71.000 cm/s LVOT VTI:    0.187 m  AORTA Ao Asc diam: 3.50 cm TRICUSPID VALVE TR Peak grad:   22.3 mmHg TR Vmax:        236.00 cm/s  SHUNTS Systemic VTI:  0.19 m Systemic Diam: 2.20 cm Mihai Croitoru MD Electronically signed by Sanda Klein MD Signature Date/Time: 08/21/2021/1:58:30 PM    Final     Labs:  CBC: Recent Labs    08/22/21 0204 08/22/21 1641 08/23/21 0313 08/24/21 0242 08/25/21 0300   WBC 14.3*  --  12.0* 10.1 10.9*  HGB 8.0* 8.5* 8.1* 8.5* 8.6*  HCT 24.7* 26.3* 25.2* 26.2* 26.5*  PLT 88*  --  86* 83* 74*    COAGS: Recent Labs    08/18/21 0301  INR 1.3*    BMP: Recent Labs    08/22/21 1641 08/23/21 0313 08/24/21 0242 08/25/21 0300  NA 144 142 140 136  K 3.5 3.6 3.5 2.9*  CL 114* 110 109 108  CO2 17* 21* 21* 18*  GLUCOSE 132* 110* 95 88  BUN 58* 54* 49* 43*  CALCIUM 8.5* 8.7* 8.5* 8.2*  CREATININE 4.65* 4.34* 3.77* 3.37*  GFRNONAA 12* 13* 15* 17*    LIVER FUNCTION TESTS: Recent Labs    08/20/21 2340 08/23/21 0313 08/24/21 0242 08/25/21 0300  BILITOT 0.9 0.7 0.4 0.8  AST 17 15 12* 11*  ALT 7 6 <5 7  ALKPHOS 269* 280* 285* 378*  PROT 6.0* 6.1* 5.8* 6.0*  ALBUMIN 2.2* 2.0* 2.0* 2.0*    Assessment and Plan:  Patient observed to be resting in bed. He is watching tv. Due to history of dementia patient is poor historian.   -Right nephrostomy tube insertion site has slight erythema with no drainage, bleeding or other s/sx of infection. Sutures and StatLock in place.  No redness, drainage or other signs of infection noted.  Right gravity bag has 200 mL clear, yellow urine with 1525 mL documented in epic for the past 24 hours. Dressing has small amount of dried blood. -Left nephrostomy tube insertion site unremarkable with sutures and StatLock in place.  No redness, drainage or other signs of infection noted.  Left gravity bag has approximately scant amount mL red-colored urine with 575 mL documented in epic for the past 24 hours. There is approximately 100 mL red-colored urine from bag drained onto sheets where drain was left open. Dressing  has small amount of dried blood.  WBC 10.9 Creat 3.37 GFR 17 He is in no distress.  Afebrile, VSS  Document bilateral nephrostomy OP q shift Keep dressings clean and dry, change as needed. Call IR for questions or concerns.  Electronically Signed: Tyson Alias, NP 08/25/2021, 9:32 AM   I spent a total  of 15 Minutes at the the patient's bedside AND on the patient's hospital floor or unit, greater than 50% of which was counseling/coordinating care for bilateral nephrostomy tubes.

## 2021-08-26 ENCOUNTER — Inpatient Hospital Stay (HOSPITAL_COMMUNITY): Payer: Medicare Other

## 2021-08-26 DIAGNOSIS — T83511A Infection and inflammatory reaction due to indwelling urethral catheter, initial encounter: Secondary | ICD-10-CM | POA: Diagnosis not present

## 2021-08-26 DIAGNOSIS — E872 Acidosis, unspecified: Secondary | ICD-10-CM | POA: Diagnosis not present

## 2021-08-26 DIAGNOSIS — C61 Malignant neoplasm of prostate: Secondary | ICD-10-CM | POA: Diagnosis not present

## 2021-08-26 DIAGNOSIS — N179 Acute kidney failure, unspecified: Secondary | ICD-10-CM | POA: Diagnosis not present

## 2021-08-26 LAB — CBC WITH DIFFERENTIAL/PLATELET
Abs Immature Granulocytes: 0.62 10*3/uL — ABNORMAL HIGH (ref 0.00–0.07)
Basophils Absolute: 0 10*3/uL (ref 0.0–0.1)
Basophils Relative: 0 %
Eosinophils Absolute: 0.2 10*3/uL (ref 0.0–0.5)
Eosinophils Relative: 1 %
HCT: 27.3 % — ABNORMAL LOW (ref 39.0–52.0)
Hemoglobin: 8.7 g/dL — ABNORMAL LOW (ref 13.0–17.0)
Immature Granulocytes: 5 %
Lymphocytes Relative: 21 %
Lymphs Abs: 2.5 10*3/uL (ref 0.7–4.0)
MCH: 30.5 pg (ref 26.0–34.0)
MCHC: 31.9 g/dL (ref 30.0–36.0)
MCV: 95.8 fL (ref 80.0–100.0)
Monocytes Absolute: 1 10*3/uL (ref 0.1–1.0)
Monocytes Relative: 8 %
Neutro Abs: 7.8 10*3/uL — ABNORMAL HIGH (ref 1.7–7.7)
Neutrophils Relative %: 65 %
Platelets: 80 10*3/uL — ABNORMAL LOW (ref 150–400)
RBC: 2.85 MIL/uL — ABNORMAL LOW (ref 4.22–5.81)
RDW: 18.6 % — ABNORMAL HIGH (ref 11.5–15.5)
WBC: 12.1 10*3/uL — ABNORMAL HIGH (ref 4.0–10.5)
nRBC: 0 % (ref 0.0–0.2)

## 2021-08-26 LAB — BASIC METABOLIC PANEL
Anion gap: 10 (ref 5–15)
BUN: 45 mg/dL — ABNORMAL HIGH (ref 8–23)
CO2: 17 mmol/L — ABNORMAL LOW (ref 22–32)
Calcium: 8.8 mg/dL — ABNORMAL LOW (ref 8.9–10.3)
Chloride: 116 mmol/L — ABNORMAL HIGH (ref 98–111)
Creatinine, Ser: 3.17 mg/dL — ABNORMAL HIGH (ref 0.61–1.24)
GFR, Estimated: 18 mL/min — ABNORMAL LOW (ref >60)
Glucose, Bld: 112 mg/dL — ABNORMAL HIGH (ref 70–99)
Potassium: 4.8 mmol/L (ref 3.5–5.1)
Sodium: 143 mmol/L (ref 135–145)

## 2021-08-26 LAB — MAGNESIUM: Magnesium: 2.2 mg/dL (ref 1.7–2.4)

## 2021-08-26 LAB — PHOSPHORUS: Phosphorus: 2.9 mg/dL (ref 2.5–4.6)

## 2021-08-26 MED ORDER — SODIUM CHLORIDE 0.9 % IV SOLN: 2.0000 g | INTRAVENOUS | Status: DC

## 2021-08-26 MED ORDER — SODIUM BICARBONATE 8.4 % IV SOLN
INTRAVENOUS | Status: DC
  Filled 2021-08-26 (×9): qty 1000

## 2021-08-26 NOTE — Progress Notes (Addendum)
PROGRESS NOTE    Anthony Dudley  XHB:[id number] DOB: 09/28/1933 DOA: 08/17/2021 PCP: [name], MD    Brief Narrative:  87 yo with hx of metastatic prostate cancer, chronic indwelling foley, CKD stage III, hx MDR UTI, dementia, chronic bedbound status, HFpEF, HTN and multiple other medical problems presenting with AKI on CKD from the nursing home.    Creatinine reportedly 2.5 on 8/26.   Cr reportedly 6.1 on 10/3.  He was recently transfused with a HB of 6.6 at [facility] on 08/10/21.     He was admitted with sepsis 2/2 UTI and AKI on CKD.  Urology was consulted due to hydronephrosis.  He had cystoscopy and fulguration of bladder on 10/5 with failed attempt at stent placement.  IR was consulted for bilateral nephrostomy tubes which were placed on 10/6.  His hospitalization was complicated by SVT and he was started on amiodarone and cardiology and PCCM were consulted due to hypotension. His Renal functions gradually improving but remains very debilitated and frail with failure to thrive.  Urology continuing to follow.  His brother has been involved in care and wants to be full code.   Assessment & Plan:   Principal Problem:   Acute-on-chronic kidney injury ([place]) Active Problems:   Metabolic acidosis   Sepsis ([place])   UTI (urinary tract infection) due to urinary indwelling catheter (H[facility])   Prostate cancer ([facility])   Malnutrition of moderate degree  Acute kidney injury on chronic kidney disease stage IIIa with bilateral hydronephrosis/metabolic acidosis/hypernatremia: Reported baseline creatinine 2.5, presented with creatinine of 6.1. Unable to do cystoscopy and ureteroscopy, therefore patient underwent bilateral percutaneous nephrostomy by interventional radiology. He has had some improvement in renal function, however patient overall remains in very poor and debilitated condition.  Followed by urology/IR for possible conversion to internal stents and suprapubic catheter placement  in a.m.  Sepsis secondary to UTI present on admission due to chronic indwelling Foley catheter, bilateral multifocal pneumonia: Presented with tachypnea, tachycardia and leukocytosis.  Prior history of MDR Proteus sensitive to cefepime. Treated with cefepime and vancomycin, completed 7 days of therapy to cover for both UTI and pneumonia. Foley exchanged in the emergency room.  Completed antibiotics.  Acute on chronic anemia of blood loss: Patient with multiple chronic illnesses.  Hemoglobin 6.6 on 9/27, 2 units of PRBC transfusion.  Remains a stable since then.  Acute hypoxemic respiratory failure: With severe multilobular bilateral bronchopneumonia and also suspect renal failure.  He has completed antibiotic therapy.  Echocardiogram with normal ejection fraction.  Appears to be requiring more oxygen at 4 L.  We will repeat chest x-ray to ensure he is not developing significant pleural effusion since he has been receiving IV fluids and is hypoalbuminemic.  If he is developing significant effusion, will need Lasix since this would affect his ability to lay flat for any procedures.  Hypokalemia: Replace aggressively and monitor levels.  Magnesium is adequate.  SVT/hypotension: Currently in sinus rhythm.  Initially started on amiodarone, but that has since been discontinued.  Echocardiogram with normal ejection fraction.  TSH normal.  Blood pressures and heart rate normalized.  Failure to thrive/metastatic prostate cancer/bedbound status/severe protein calorie malnutrition/dementia Patient with advanced debility and frailty.  He has poor quality of life and less likely to have a meaningful recovery with rehab. Palliative care did attempt discussion regarding overall goals with patient's brother, but it did not appear that he was ready to have that conversation at this time His brother wishes to continue full code/full  measures at this time  Thrombocytopenia -Possibly related to recent  infection -Continue to monitor  HFpEF -Ejection fraction of 55 to 60% -Appears to be developing some anasarca, suspect this is related to low protein state -Can consider starting on intravenous Lasix if chest x-ray shows pulmonary edema.   DVT prophylaxis: Place and maintain sequential compression device Start: 08/21/21 0754 SCDs Start: 08/18/21 0235   Code Status: Full code Family Communication: Unable to reach brother over the phone Disposition Plan: Status is: Inpatient  Remains inpatient appropriate because:Persistent severe electrolyte disturbances, IV treatments appropriate due to intensity of illness or inability to take PO, and Inpatient level of care appropriate due to severity of illness  Dispo: The patient is from: SNF              Anticipated d/c is to: SNF              Patient currently is not medically stable to d/c.   Difficult to place patient No         Consultants:  Urology PCCM Cardiology Palliative care Interventional radiology  Procedures:  Cystoscopy Bilateral percutaneous nephrostomy  Antimicrobials:  Vancomycin and cefepime, completed therapy.   Subjective: Patient is sitting up in bed, keeps eyes closed, responds to some questions, speech is difficult to comprehend.  Objective: Vitals:   08/26/21 1000 08/26/21 1046 08/26/21 1200 08/26/21 1614  BP: 117/60 121/69 101/61 117/68  Pulse: (!) 112 (!) 111 (!) 113   Resp: (!) 25 (!) 26 20 (!) 25  Temp:  98.9 F (37.2 C)  99.2 F (37.3 C)  TempSrc:  Oral    SpO2: 97% 97% 97%   Weight:      Height:        Intake/Output Summary (Last 24 hours) at 08/26/2021 1904 Last data filed at 08/26/2021 1754 Gross per 24 hour  Intake 2289.14 ml  Output 2225 ml  Net 64.14 ml   Filed Weights   08/24/21 0011 08/25/21 0020 08/26/21 0016  Weight: 84.7 kg 83.1 kg 81.1 kg    Examination:  General exam: Appears frail and debilitated , sick looking, sitting up in bed, but keeps eyes closed, does  not answer questions.  Respiratory system: scattered rhonchi. On 4 liters nasal canula, appears to have some increased work of breathing  Cardiovascular system: S1 & S2 heard, RRR. Starting to develop anasarca Gastrointestinal system: Abdomen is nondistended, soft and nontender. No organomegaly or masses felt. Normal bowel sounds heard. R PCN with clear urine Left PCN with bloody urine  Foley with small amount of clear urine .    Data Reviewed: I have personally reviewed following labs and imaging studies  CBC: Recent Labs  Lab 08/20/21 2340 08/21/21 2010 08/22/21 0204 08/22/21 1641 08/23/21 0313 08/24/21 0242 08/25/21 0300 08/26/21 0106  WBC 16.7*  --  14.3*  --  12.0* 10.1 10.9* 12.1*  NEUTROABS 13.9*  --   --   --  9.4* 6.7 7.2 7.8*  HGB 8.4*  8.4*   < > 8.0* 8.5* 8.1* 8.5* 8.6* 8.7*  HCT 25.6*  25.3*   < > 24.7* 26.3* 25.2* 26.2* 26.5* 27.3*  MCV 93.8  --  93.2  --  95.8 94.2 95.7 95.8  PLT 93*  --  88*  --  86* 83* 74* 80*   < > = values in this interval not displayed.   Basic Metabolic Panel: Recent Labs  Lab 08/20/21 2340 08/21/21 1122 08/21/21 2010 08/22/21 0204 08/22/21 1641 08/23/21 0313 08/24/21  7782 08/25/21 0300 08/26/21 0106  NA 148*   < > 149*   < > 144 142 140 136 143  K 3.7   < > 3.2*   < > 3.5 3.6 3.5 2.9* 4.8  CL 113*   < > 115*   < > 114* 110 109 108 116*  CO2 19*   < > 17*   < > 17* 21* 21* 18* 17*  GLUCOSE 99   < > 177*   < > 132* 110* 95 88 112*  BUN 73*   < > 69*   < > 58* 54* 49* 43* 45*  CREATININE 5.98*   < > 5.46*   < > 4.65* 4.34* 3.77* 3.37* 3.17*  CALCIUM 8.4*   < > 8.4*   < > 8.5* 8.7* 8.5* 8.2* 8.8*  MG 1.7  --  1.8  --   --  1.6* 1.6* 1.9 2.2  PHOS 4.3  --   --   --   --  3.0 3.2 3.4 2.9   < > = values in this interval not displayed.   GFR: Estimated Creatinine Clearance: 16.7 mL/min (A) (by C-G formula based on SCr of 3.17 mg/dL (H)). Liver Function Tests: Recent Labs  Lab 08/20/21 0308 08/20/21 2340 08/23/21 0313  08/24/21 0242 08/25/21 0300  AST 24 17 15  12* 11*  ALT 7 7 6  <5 7  ALKPHOS 294* 269* 280* 285* 378*  BILITOT 0.9 0.9 0.7 0.4 0.8  PROT 6.0* 6.0* 6.1* 5.8* 6.0*  ALBUMIN 2.2* 2.2* 2.0* 2.0* 2.0*   No results for input(s): LIPASE, AMYLASE in the last 168 hours. No results for input(s): AMMONIA in the last 168 hours. Coagulation Profile: No results for input(s): INR, PROTIME in the last 168 hours. Cardiac Enzymes: No results for input(s): CKTOTAL, CKMB, CKMBINDEX, TROPONINI in the last 168 hours. BNP (last 3 results) No results for input(s): PROBNP in the last 8760 hours. HbA1C: No results for input(s): HGBA1C in the last 72 hours. CBG: No results for input(s): GLUCAP in the last 168 hours. Lipid Profile: No results for input(s): CHOL, HDL, LDLCALC, TRIG, CHOLHDL, LDLDIRECT in the last 72 hours. Thyroid Function Tests: No results for input(s): TSH, T4TOTAL, FREET4, T3FREE, THYROIDAB in the last 72 hours. Anemia Panel: No results for input(s): VITAMINB12, FOLATE, FERRITIN, TIBC, IRON, RETICCTPCT in the last 72 hours. Sepsis Labs: Recent Labs  Lab 08/20/21 0425 08/20/21 4235  LATICACIDVEN 1.0 0.9    Recent Results (from the past 240 hour(s))  Urine Culture     Status: Abnormal   Collection Time: 08/17/21 11:16 PM   Specimen: Urine, Catheterized  Result Value Ref Range Status   Specimen Description URINE, CATHETERIZED  Final   Special Requests   Final    NONE Performed at [facility], [address] [place], [place] [address]    Culture MULTIPLE SPECIES PRESENT, SUGGEST RECOLLECTION (A)  Final   Report Status 08/18/2021 FINAL  Final  Resp Panel by RT-PCR (Flu A&B, Covid) Nasopharyngeal Swab     Status: None   Collection Time: 08/17/21 11:19 PM   Specimen: Nasopharyngeal Swab; Nasopharyngeal(NP) swabs in vial transport medium  Result Value Ref Range Status   SARS Coronavirus 2 by RT PCR NEGATIVE NEGATIVE Final    Comment: (NOTE) SARS-CoV-2 target nucleic acids  are NOT DETECTED.  The SARS-CoV-2 RNA is generally detectable in upper respiratory specimens during the acute phase of infection. The lowest concentration of SARS-CoV-2 viral copies this assay can detect  is 138 copies/mL. A negative result does not preclude SARS-Cov-2 infection and should not be used as the sole basis for treatment or other patient management decisions. A negative result may occur with  improper specimen collection/handling, submission of specimen other than nasopharyngeal swab, presence of viral mutation(s) within the areas targeted by this assay, and inadequate number of viral copies(<138 copies/mL). A negative result must be combined with clinical observations, patient history, and epidemiological information. The expected result is Negative.  Fact Sheet for Patients:  EntrepreneurPulse.com.au  Fact Sheet for Healthcare Providers:  IncredibleEmployment.be  This test is no t yet approved or cleared by the [place] FDA and  has been authorized for detection and/or diagnosis of SARS-CoV-2 by FDA under an Emergency Use Authorization (EUA). This EUA will remain  in effect (meaning this test can be used) for the duration of the COVID-19 declaration under Section 564(b)(1) of the Act, 21 U.S.C.section 360bbb-3(b)(1), unless the authorization is terminated  or revoked sooner.       Influenza A by PCR NEGATIVE NEGATIVE Final   Influenza B by PCR NEGATIVE NEGATIVE Final    Comment: (NOTE) The Xpert Xpress SARS-CoV-2/FLU/RSV plus assay is intended as an aid in the diagnosis of influenza from Nasopharyngeal swab specimens and should not be used as a sole basis for treatment. Nasal washings and aspirates are unacceptable for Xpert Xpress SARS-CoV-2/FLU/RSV testing.  Fact Sheet for Patients: EntrepreneurPulse.com.au  Fact Sheet for Healthcare Providers: IncredibleEmployment.be  This test is  not yet approved or cleared by the [place] FDA and has been authorized for detection and/or diagnosis of SARS-CoV-2 by FDA under an Emergency Use Authorization (EUA). This EUA will remain in effect (meaning this test can be used) for the duration of the COVID-19 declaration under Section 564(b)(1) of the Act, 21 U.S.C. section 360bbb-3(b)(1), unless the authorization is terminated or revoked.  Performed at [facility], [place] [address], [place], [place] [address]   Culture, blood (routine x 2)     Status: None   Collection Time: 08/18/21  2:55 AM   Specimen: BLOOD  Result Value Ref Range Status   Specimen Description BLOOD RIGHT ANTECUBITAL  Final   Special Requests   Final    BOTTLES DRAWN AEROBIC AND ANAEROBIC Blood Culture adequate volume   Culture   Final    NO GROWTH 5 DAYS Performed at [facility], [place] [address] [place], [place] [address]    Report Status 08/23/2021 FINAL  Final  Culture, blood (routine x 2)     Status: None   Collection Time: 08/18/21  3:01 AM   Specimen: BLOOD  Result Value Ref Range Status   Specimen Description BLOOD BLOOD RIGHT WRIST  Final   Special Requests   Final    BOTTLES DRAWN AEROBIC AND ANAEROBIC Blood Culture adequate volume   Culture   Final    NO GROWTH 5 DAYS Performed at [facility], [place] [address], [place], [place] [address]    Report Status 08/23/2021 FINAL  Final  MRSA Next Gen by PCR, Nasal     Status: Abnormal   Collection Time: 08/19/21  3:50 PM   Specimen: Nasal Mucosa; Nasal Swab  Result Value Ref Range Status   MRSA by PCR Next Gen DETECTED (A) NOT DETECTED Final    Comment: RESULT CALLED TO, READ BACK BY AND VERIFIED WITH: K DUFFY RN [id number] 08/19/21 A BROWNING (NOTE) The GeneXpert MRSA Assay (FDA approved for NASAL specimens only), is one  component of a comprehensive MRSA colonization surveillance program. It is not intended to diagnose MRSA infection nor to guide or monitor treatment for MRSA  infections. Test performance is not FDA approved in patients less than 4 years old. Performed at [facility], [place] [address] [place], [place] [address]          Radiology Studies: No results found.      Scheduled Meds:  sodium chloride   Intravenous Once   sodium chloride   Intravenous Once   Chlorhexidine Gluconate Cloth  6 each Topical Daily   magnesium oxide  400 mg Oral BID   multivitamin with minerals  1 tablet Oral Daily   oxybutynin  5 mg Oral QHS   potassium chloride  40 mEq Oral TID   Continuous Infusions:  [START ON 08/27/2021] cefTRIAXone (ROCEPHIN)  IV     lactated ringers     sodium bicarbonate 150 mEq in D5W infusion 75 mL/hr at 08/26/21 1244   sodium chloride irrigation       LOS: 8 days    Time spent: 35 minutes    Kathie Dike, MD [facility]

## 2021-08-26 NOTE — Progress Notes (Signed)
Speech Language Pathology Treatment: Dysphagia  Patient Details Name: Anthony Dudley MRN: [id number] DOB: [birth year] Today's Date: 08/26/2021 Time: 1100-1110 SLP Time Calculation (min) (ACUTE ONLY): 10 min  Assessment / Plan / Recommendation Clinical Impression  Give pt poor acceptance of PO, attempted upgraged trials. Pt did consume 8 oz of ice water without signs of aspiration, but he continues to be impulsive with poor arousal, which led to some coughing with liquids in prior sessions. Pt would not allow me to place his dentures for upgraded solids. Recommend pt be offered sips of thin ice water for comfort in alert. Otherwise continue purees and honey thick juice and milk.   HPI HPI: 87yo male admitted from nursing home 08/17/21 with worsening kidney function. PMH: prostate cancer, CKD3, chronic indwelling Foley catheter, MDR UTI, dementia, bedbound at baseline, dCHF, HTN, HLD, Cvid (04/2019). CXR = no acute airspace disease      SLP Plan  Continue with current plan of care      Recommendations for follow up therapy are one component of a multi-disciplinary discharge planning process, led by the attending physician.  Recommendations may be updated based on patient status, additional functional criteria and insurance authorization.    Recommendations  Diet recommendations: Thin liquid;Honey-thick liquid;Dysphagia 1 (puree) Liquids provided via: Cup;Straw Medication Administration: Crushed with puree Supervision: Full supervision/cueing for compensatory strategies;Trained caregiver to feed patient Compensations: Slow rate;Small sips/bites Postural Changes and/or Swallow Maneuvers: Seated upright 90 degrees                Oral Care Recommendations: Oral care BID Follow up Recommendations: 24 hour supervision/assistance;Skilled Nursing facility SLP Visit Diagnosis: Dysphagia, unspecified (R13.10) Plan: Continue with current plan of care       GO                Anthony Dudley,  Anthony Dudley  08/26/2021, 1:53 PM

## 2021-08-26 NOTE — Progress Notes (Signed)
Patient ID: Anthony Dudley, male   DOB: 09-09-1933, 87 y.o.   MRN: [id number]  Discussed with IR and agree with plan for bilateral internalization of ureteral stents tomorrow.

## 2021-08-26 NOTE — Consult Note (Signed)
Chief Complaint: Patient was seen in consultation today for Bilateral  internalization ureteral stent placements Chief Complaint  Patient presents with   Acute Renal Failure   Abnormal Lab   at the request of Dr Alinda Money  Supervising Physician: Arne Cleveland  Patient Status: [facility] - In-pt  History of Present Illness: Anthony Dudley is a 87 y.o. male   Prostate cancer Chronic indwelling cath CKD3; dementia Admitted with A/CKD -- Hydronephrosis; urinary obstruction IR placed Bilat PCNs 10/6 Renal function improving Dr Alinda Money calls this am--- requesting IR to internalize ureteral stents Discussed with Dr Vernard Gambles-- approves procedure   History reviewed. No pertinent past medical history.  Past Surgical History:  Procedure Laterality Date   CYSTOSCOPY WITH RETROGRADE PYELOGRAM, URETEROSCOPY AND STENT PLACEMENT Bilateral 08/18/2021   Procedure: 1.  Cystoscopy 2.  Fulguration of bladder;  Surgeon: Raynelle Bring, MD;  Location: [facility];  Service: Urology;  Laterality: Bilateral;   IR NEPHROSTOMY PLACEMENT LEFT  08/19/2021   IR NEPHROSTOMY PLACEMENT RIGHT  08/19/2021    Allergies: Solanum lycopersicum [tomato]  Medications: Prior to Admission medications   Medication Sig Start Date End Date Taking? Authorizing Provider  Cholecalciferol (VITAMIN D-3) 125 MCG (5000 UT) TABS Take 5,000 Units by mouth daily.   Yes [provider]  ipratropium-albuterol (DUONEB) 0.5-2.5 (3) MG/3ML SOLN Take 3 mLs by nebulization every 4 (four) hours as needed (shortness of breath).   Yes [provider]  metoprolol tartrate (LOPRESSOR) 25 MG tablet Take 12.5 mg by mouth 2 (two) times daily.   Yes [provider]  Multiple Vitamins-Iron (MULTI-VITAMIN/IRON) TABS Take 1 tablet by mouth in the morning and at bedtime.   Yes [provider]  niacinamide 500 MG tablet Take 500 mg by mouth daily.   Yes [provider]  Nutritional Supplements (BOOST BREEZE PO)  Take 1 Bottle by mouth 3 (three) times daily.   Yes [provider]  OXYGEN Inhale into the lungs See admin instructions. Put on oxygen if O2 is less than 90% every shift   Yes [provider]  sodium bicarbonate 650 MG tablet Take 650 mg by mouth daily.   Yes [provider]  tamsulosin (FLOMAX) 0.4 MG CAPS capsule Take 0.4 mg by mouth daily.   Yes [provider]  tolterodine (DETROL) 1 MG tablet Take 1 mg by mouth 2 (two) times daily.   Yes [provider]  vitamin B-12 (CYANOCOBALAMIN) 1000 MCG tablet Take 2,000 mcg by mouth daily.   Yes [provider]     History reviewed. No pertinent family history.  Social History   Socioeconomic History   Marital status: Single    Spouse name: Not on file   Number of children: Not on file   Years of education: Not on file   Highest education level: Not on file  Occupational History   Not on file  Tobacco Use   Smoking status: Not on file   Smokeless tobacco: Not on file  Substance and Sexual Activity   Alcohol use: Not on file   Drug use: Not on file   Sexual activity: Not on file  Other Topics Concern   Not on file  Social History Narrative   Not on file   Social Determinants of Health   Financial Resource Strain: Not on file  Food Insecurity: Not on file  Transportation Needs: Not on file  Physical Activity: Not on file  Stress: Not on file  Social Connections: Not on file  Review of Systems: A 12 point ROS discussed and pertinent positives are indicated in the HPI above.  All other systems are negative.    Vital Signs: BP 101/61   Pulse (!) 113   Temp 98.9 F (37.2 C) (Oral)   Resp 20   Ht 5\' 9"  (1.753 m) Comment: unable to stand, approximate measurement lying in bed.  Wt 178 lb 12.7 oz (81.1 kg)   SpO2 97%   BMI 26.40 kg/m   Physical Exam Vitals reviewed.  Constitutional:      Comments: Restful; up in bed Does not respond-- moaning  Cardiovascular:      Rate and Rhythm: Normal rate.  Pulmonary:     Breath sounds: Normal breath sounds.  Musculoskeletal:     Comments: Does not follow commands Asleep   Skin:    General: Skin is warm.    Imaging: CT ABDOMEN PELVIS WO CONTRAST  Result Date: 08/19/2021 CLINICAL DATA:  Acute renal failure.  Bilateral hydronephrosis. EXAM: CT ABDOMEN AND PELVIS WITHOUT CONTRAST TECHNIQUE: Multidetector CT imaging of the abdomen and pelvis was performed following the standard protocol without IV contrast. COMPARISON:  12/07/2020 FINDINGS: Lower chest: Trace bilateral pleural effusions. Hepatobiliary: No focal liver abnormality is seen. No gallstones, gallbladder wall thickening, or biliary dilatation. Pancreas: Unremarkable. No pancreatic ductal dilatation or surrounding inflammatory changes. Spleen: Normal in size without focal abnormality. Adrenals/Urinary Tract: Adrenal glands are unremarkable. Moderate bilateral hydronephrosis and hydroureter. 6 mm nonobstructing calculus of the lower pole of the right kidney. 8 mm nonobstructing calculus at the lower pole of the left kidney. 4 mm calculus within the dependent portion of the bladder. The bladder is collapsed around a Foley catheter balloon which limits evaluation. Stomach/Bowel: No bowel dilatation to indicate ileus or obstruction. Extensive sigmoid and descending colon diverticulosis without evidence of acute diverticulitis. Vascular/Lymphatic: Atherosclerotic calcifications seen throughout the abdominal aorta without aneurysm. No enlarged abdominal pelvic lymph nodes. Reproductive: Calcifications noted within the prostate. Other: No abdominal wall hernia or abnormality. No abdominopelvic ascites. Musculoskeletal: Diffuse osteoblastic metastatic disease again seen. Unchanged moderate to severe compression deformity of the L1 vertebral body. IMPRESSION: Moderate bilateral hydronephrosis and hydroureter. Bilateral nonobstructing renal calculi. Electronically Signed    By: Miachel Roux M.D.   On: 08/19/2021 13:43   DG Cystogram  Result Date: 08/19/2021 CLINICAL DATA:  Surgery, elective. EXAM: CYSTOGRAM TECHNIQUE: A single intraprocedural fluoroscopic image from reported cystoscopy and bladder fulguration is submitted. FLUOROSCOPY TIME:  Fluoroscopy Time:  14 seconds COMPARISON:  Renal ultrasound 08/18/2021. CT abdomen/pelvis 06/06/2021. FINDINGS: A single intraprocedural fluoroscopic image of the pelvis from reported cystoscopy and bladder fulguration is submitted. On the provided image, a cystoscope projects in the region of the pelvis. Correlate with the procedural history. IMPRESSION: Single intraprocedural fluoroscopic image of the pelvis from reported cystoscopy and bladder fulguration. Please correlate with the procedural history. Electronically Signed   By: Kellie Simmering D.O.   On: 08/19/2021 09:36   US Renal  Result Date: 08/18/2021 CLINICAL DATA:  Renal failure EXAM: RENAL / URINARY TRACT ULTRASOUND COMPLETE COMPARISON:  06/06/2021 FINDINGS: Right Kidney: Renal measurements: 10.2 x 5.3 x 5.3 cm. = volume: 150 mL. Moderate hydronephrosis is noted. This has progressed slightly in the interval from the prior CT. Previously seen right renal calculus is not well appreciated on today's exam. Left Kidney: Renal measurements: 9.8 x 5.5 x 4.5 cm. = volume: 128 mL. Moderate left-sided hydronephrosis is noted as well stable in appearance from prior CT. Previously seen renal calculus is not  well appreciated on today's exam. Bladder: Foley catheter is noted within the bladder. Other: None. IMPRESSION: Bilateral hydronephrosis left slightly greater than right similar to that seen on prior CT examination. Previously noted renal calculi bilaterally are not well appreciated on today's exam. Electronically Signed   By: Inez Catalina M.D.   On: 08/18/2021 00:30   DG Chest Port 1 View  Result Date: 08/21/2021 CLINICAL DATA:  87 year old male with history of abnormal respiration.  EXAM: PORTABLE CHEST 1 VIEW COMPARISON:  Chest x-ray 08/20/2021. FINDINGS: Lung volumes are low. Diffuse interstitial prominence and patchy ill-defined and nodular opacities are noted throughout the lungs bilaterally. Small left pleural effusion. No definite right pleural effusion. No appreciable pneumothorax. Pulmonary vasculature does not appearing origin. Heart size is normal. The patient is rotated to the left on today's exam, resulting in distortion of the mediastinal contours and reduced diagnostic sensitivity and specificity for mediastinal pathology. Atherosclerotic calcifications in the thoracic aorta. IMPRESSION: 1. Severe multilobar bilateral bronchopneumonia again noted. 2. Small left pleural effusion. 3. Aortic atherosclerosis. Electronically Signed   By: Vinnie Langton M.D.   On: 08/21/2021 10:25   DG CHEST PORT 1 VIEW  Result Date: 08/20/2021 CLINICAL DATA:  Hypoxia, respiratory failure, confusion. EXAM: PORTABLE CHEST 1 VIEW COMPARISON:  Chest radiograph 08/19/2021, CT abdomen pelvis 08/19/2021 FINDINGS: Diffuse hazy interstitial thickening and prominence of the perihilar vasculature. Unchanged retrocardiac left lower lobe opacity. No pneumothorax. Stable cardiomediastinal silhouette. Aortic calcifications. Numerous sclerotic osseous lesions, particularly within thoracolumbar spine, better appreciated on yesterday's cross-sectional imaging. IMPRESSION: Mild pulmonary edema. Retrocardiac left lower lobe opacity may represent combination of pleural fluid and atelectasis, though pneumonia is not excluded. Electronically Signed   By: Ileana Roup M.D.   On: 08/20/2021 09:32   DG CHEST PORT 1 VIEW  Result Date: 08/19/2021 CLINICAL DATA:  Hypoxia. EXAM: PORTABLE CHEST 1 VIEW COMPARISON:  08/17/2021 FINDINGS: The cardiomediastinal silhouette is unchanged with normal heart size. Aortic atherosclerosis is noted. Lung volumes are low there are increasing patchy airspace opacities throughout the left  lung with milder airspace opacities on the right. There is persistent elevation of the left hemidiaphragm. No large pleural effusion or pneumothorax is identified although the patient's chin partially obscures the lung apices. Widespread sclerotic bone metastases are again noted. IMPRESSION: Increasing left greater than right lung airspace opacities concerning for pneumonia. Electronically Signed   By: Logan Bores M.D.   On: 08/19/2021 14:58   DG Chest Port 1 View  Result Date: 08/17/2021 CLINICAL DATA:  Shortness of breath EXAM: PORTABLE CHEST 1 VIEW COMPARISON:  06/06/2021, CT 06/06/2021 FINDINGS: Chronic scarring in the left upper lobe. No acute airspace disease or pleural effusion. Stable cardiomediastinal silhouette with aortic atherosclerosis. Patchy bilateral skeletal sclerosis consistent with osseous metastatic disease. IMPRESSION: 1. Scarring in the left upper lobe.  No acute airspace disease. 2. Diffuse rib sclerosis consistent with skeletal metastatic disease Electronically Signed   By: Donavan Foil M.D.   On: 08/17/2021 23:41   ECHOCARDIOGRAM COMPLETE  Result Date: 08/21/2021    ECHOCARDIOGRAM REPORT   Patient Name:   Anthony Dudley Date of Exam: 08/21/2021 Medical Rec #:  [id number]      Height:       69.0 in Accession #:    [id number]     Weight:       180.8 lb Date of Birth:  08/13/1933      BSA:          1.979 m Patient Age:    45  years       BP:           122/78 mmHg Patient Gender: M              HR:           110 bpm. Exam Location:  Inpatient Procedure: 2D Echo, Color Doppler and Cardiac Doppler Indications:    Ventricular Tachycardia I47.2  History:        Patient has no prior history of Echocardiogram examinations.  Sonographer:    Bernadene Person RDCS Referring Phys: [id number] T[place]io Comments: Image acquisition challenging due to patient behavioral factors. and Image acquisition challenging due to uncooperative patient. IMPRESSIONS  1. Left ventricular ejection  fraction, by estimation, is 55 to 60%. The left ventricle has normal function. The left ventricle has no regional wall motion abnormalities. There is mild left ventricular hypertrophy. Left ventricular diastolic parameters are indeterminate.  2. Right ventricular systolic function was not well visualized. The right ventricular size is not well visualized. There is normal pulmonary artery systolic pressure.  3. The mitral valve is normal in structure. Trivial mitral valve regurgitation. No evidence of mitral stenosis.  4. The aortic valve was not well visualized. Aortic valve regurgitation is not visualized. Mild to moderate aortic valve sclerosis/calcification is present, without any evidence of aortic stenosis. FINDINGS  Left Ventricle: Left ventricular ejection fraction, by estimation, is 55 to 60%. The left ventricle has normal function. The left ventricle has no regional wall motion abnormalities. The left ventricular internal cavity size was normal in size. There is  mild left ventricular hypertrophy. Left ventricular diastolic parameters are indeterminate. Right Ventricle: The right ventricular size is not well visualized. Right vetricular wall thickness was not well visualized. Right ventricular systolic function was not well visualized. There is normal pulmonary artery systolic pressure. The tricuspid regurgitant velocity is 2.36 m/s, and with an assumed right atrial pressure of 3 mmHg, the estimated right ventricular systolic pressure is 45.4 mmHg. Left Atrium: Left atrial size was normal in size. Right Atrium: Right atrial size was normal in size. Pericardium: There is no evidence of pericardial effusion. Mitral Valve: The mitral valve is normal in structure. Trivial mitral valve regurgitation. No evidence of mitral valve stenosis. Tricuspid Valve: The tricuspid valve is normal in structure. Tricuspid valve regurgitation is trivial. Aortic Valve: The aortic valve was not well visualized. Aortic valve  regurgitation is not visualized. Mild to moderate aortic valve sclerosis/calcification is present, without any evidence of aortic stenosis. Pulmonic Valve: The pulmonic valve was not well visualized. Pulmonic valve regurgitation is not visualized. Aorta: The aortic root and ascending aorta are structurally normal, with no evidence of dilitation. IAS/Shunts: The interatrial septum was not well visualized.  LEFT VENTRICLE PLAX 2D LVIDd:         3.60 cm LVIDs:         2.60 cm LV PW:         0.90 cm LV IVS:        1.10 cm LVOT diam:     2.20 cm LV SV:         71 LV SV Index:   36 LVOT Area:     3.80 cm  RIGHT VENTRICLE TAPSE (M-mode): 1.6 cm LEFT ATRIUM             Index        RIGHT ATRIUM           Index LA diam:  3.10 cm 1.57 cm/m   RA Area:     11.30 cm LA Vol (A2C):   30.6 ml 15.46 ml/m  RA Volume:   22.60 ml  11.42 ml/m LA Vol (A4C):   31.3 ml 15.81 ml/m LA Biplane Vol: 31.1 ml 15.71 ml/m  AORTIC VALVE LVOT Vmax:   111.00 cm/s LVOT Vmean:  71.000 cm/s LVOT VTI:    0.187 m  AORTA Ao Asc diam: 3.50 cm TRICUSPID VALVE TR Peak grad:   22.3 mmHg TR Vmax:        236.00 cm/s  SHUNTS Systemic VTI:  0.19 m Systemic Diam: 2.20 cm Dani Gobble Croitoru MD Electronically signed by Sanda Klein MD Signature Date/Time: 08/21/2021/1:58:30 PM    Final    IR NEPHROSTOMY PLACEMENT LEFT  Result Date: 08/20/2021 INDICATION: History of metastatic prostate cancer, now with bilateral obstructive uropathy. Please perform placement of bilateral nephrostomy catheters for urinary diversion purposes. EXAM: ULTRASOUND AND FLUOROSCOPIC GUIDED PLACEMENT OF BILATERAL NEPHROSTOMY TUBES COMPARISON:  CT abdomen pelvis-08/19/2021 MEDICATIONS: Patient is currently admitted to the hospital receiving intravenous antibiotics.; The antibiotic was administered in an appropriate time frame prior to skin puncture. ANESTHESIA/SEDATION: Moderate (conscious) sedation was employed during this procedure, administered by the interventional radiology  RN. A total of Versed 0.5 mg was administered intravenously. Moderate Sedation Time: 25 minutes. The patient's level of consciousness and vital signs were monitored continuously by radiology nursing throughout the procedure under my direct supervision. CONTRAST:  40 mL Omnipaque 350-administered into both renal collecting systems. FLUOROSCOPY TIME:  1 minute, 48 seconds (1.3 mGy) COMPLICATIONS: None immediate. PROCEDURE: The procedure, risks, benefits, and alternatives were explained to the the patient's family, questions were encouraged and answered and informed consent was obtained. A timeout was performed prior to the initiation of the procedure. The operative sites were prepped and draped in the usual sterile fashion and a sterile drape was applied covering the operative field. A sterile gown and sterile gloves were used for the procedure. Local anesthesia was provided with 1% Lidocaine with epinephrine. Beginning with the left kidney, ultrasound was used to localize the left kidney. Under direct ultrasound guidance, a 20 gauge needle was advanced into the renal collecting system. An ultrasound image documentation was performed. Access within the collecting system was confirmed with the efflux of urine followed by limited contrast injection. Under intermittent fluoroscopic guidance, an 0.018 wire was advanced into the collecting system and the tract was dilated with an Accustick stent. Next, over a short Amplatz wire, the track was further dilated ultimately allowing placement of a 10-French percutaneous nephrostomy catheter with end coiled and locked within the renal pelvis. Contrast was injected and several spot fluoroscopic images were obtained in various obliquities. The contralateral procedure was repeated for the right-sided nephrostomy catheter however note, an upper pole access was acquired secondary to angulation of the right kidney with the inferior pole being located deep and medial with poor  sonographic visualization, accentuated due to patient's medical instability and inability to lie prone on the fluoroscopy table. Ultimately, under ultrasound fluoroscopic guidance, a 10 French nephrostomy catheter was placed via a posterosuperior calyx with end coiled and locked within the right renal pelvis. Contrast was injected several spot fluoroscopic images were obtained in various obliquities Both catheters were secured at the skin entrance site within interrupted sutures and StatLock devices. Both nephrostomies were connected to gravity bags. Dressings were applied. The patient tolerated procedure well without immediate postprocedural complication. FINDINGS: Ultrasound scanning demonstrates a moderate dilated bilateral collecting systems. Under  a combination of ultrasound and fluoroscopic guidance, a left-sided posterior inferior calix was targeted allowing placement of a 10-French percutaneous nephrostomy catheter with end coiled and locked within the renal pelvis. Contrast injection confirmed appropriate positioning. Under a combination of ultrasound and fluoroscopic guidance, a right-sided posterosuperior calyx was targeted (an inferior calyx was unable to be accessed secondary to angulation of the right kidney as well as patient's somewhat unstable status and inability to lie on the fluoroscopy table) allowing placement of a 10 French percutaneous nephrostomy catheter with end coiled and locked within the renal pelvis. Contrast injection confirmed appropriate positioning. IMPRESSION: Successful ultrasound and fluoroscopic guided placement of bilateral 10 French percutaneous nephrostomy catheters. Note, as above, the left-sided nephrostomy catheter is via a posteroinferior calyx while the right-sided nephrostomy catheter is via a posterosuperior caliceal access. Electronically Signed   By: Sandi Mariscal M.D.   On: 08/20/2021 12:43   IR NEPHROSTOMY PLACEMENT RIGHT  Result Date: 08/20/2021 INDICATION:  History of metastatic prostate cancer, now with bilateral obstructive uropathy. Please perform placement of bilateral nephrostomy catheters for urinary diversion purposes. EXAM: ULTRASOUND AND FLUOROSCOPIC GUIDED PLACEMENT OF BILATERAL NEPHROSTOMY TUBES COMPARISON:  CT abdomen pelvis-08/19/2021 MEDICATIONS: Patient is currently admitted to the hospital receiving intravenous antibiotics.; The antibiotic was administered in an appropriate time frame prior to skin puncture. ANESTHESIA/SEDATION: Moderate (conscious) sedation was employed during this procedure, administered by the interventional radiology RN. A total of Versed 0.5 mg was administered intravenously. Moderate Sedation Time: 25 minutes. The patient's level of consciousness and vital signs were monitored continuously by radiology nursing throughout the procedure under my direct supervision. CONTRAST:  40 mL Omnipaque 350-administered into both renal collecting systems. FLUOROSCOPY TIME:  1 minute, 48 seconds (1.3 mGy) COMPLICATIONS: None immediate. PROCEDURE: The procedure, risks, benefits, and alternatives were explained to the the patient's family, questions were encouraged and answered and informed consent was obtained. A timeout was performed prior to the initiation of the procedure. The operative sites were prepped and draped in the usual sterile fashion and a sterile drape was applied covering the operative field. A sterile gown and sterile gloves were used for the procedure. Local anesthesia was provided with 1% Lidocaine with epinephrine. Beginning with the left kidney, ultrasound was used to localize the left kidney. Under direct ultrasound guidance, a 20 gauge needle was advanced into the renal collecting system. An ultrasound image documentation was performed. Access within the collecting system was confirmed with the efflux of urine followed by limited contrast injection. Under intermittent fluoroscopic guidance, an 0.018 wire was advanced into  the collecting system and the tract was dilated with an Accustick stent. Next, over a short Amplatz wire, the track was further dilated ultimately allowing placement of a 10-French percutaneous nephrostomy catheter with end coiled and locked within the renal pelvis. Contrast was injected and several spot fluoroscopic images were obtained in various obliquities. The contralateral procedure was repeated for the right-sided nephrostomy catheter however note, an upper pole access was acquired secondary to angulation of the right kidney with the inferior pole being located deep and medial with poor sonographic visualization, accentuated due to patient's medical instability and inability to lie prone on the fluoroscopy table. Ultimately, under ultrasound fluoroscopic guidance, a 10 French nephrostomy catheter was placed via a posterosuperior calyx with end coiled and locked within the right renal pelvis. Contrast was injected several spot fluoroscopic images were obtained in various obliquities Both catheters were secured at the skin entrance site within interrupted sutures and StatLock devices. Both nephrostomies  were connected to gravity bags. Dressings were applied. The patient tolerated procedure well without immediate postprocedural complication. FINDINGS: Ultrasound scanning demonstrates a moderate dilated bilateral collecting systems. Under a combination of ultrasound and fluoroscopic guidance, a left-sided posterior inferior calix was targeted allowing placement of a 10-French percutaneous nephrostomy catheter with end coiled and locked within the renal pelvis. Contrast injection confirmed appropriate positioning. Under a combination of ultrasound and fluoroscopic guidance, a right-sided posterosuperior calyx was targeted (an inferior calyx was unable to be accessed secondary to angulation of the right kidney as well as patient's somewhat unstable status and inability to lie on the fluoroscopy table) allowing  placement of a 10 French percutaneous nephrostomy catheter with end coiled and locked within the renal pelvis. Contrast injection confirmed appropriate positioning. IMPRESSION: Successful ultrasound and fluoroscopic guided placement of bilateral 10 French percutaneous nephrostomy catheters. Note, as above, the left-sided nephrostomy catheter is via a posteroinferior calyx while the right-sided nephrostomy catheter is via a posterosuperior caliceal access. Electronically Signed   By: Sandi Mariscal M.D.   On: 08/20/2021 12:43    Labs:  CBC: Recent Labs    08/23/21 0313 08/24/21 0242 08/25/21 0300 08/26/21 0106  WBC 12.0* 10.1 10.9* 12.1*  HGB 8.1* 8.5* 8.6* 8.7*  HCT 25.2* 26.2* 26.5* 27.3*  PLT 86* 83* 74* 80*    COAGS: Recent Labs    08/18/21 0301  INR 1.3*    BMP: Recent Labs    08/23/21 0313 08/24/21 0242 08/25/21 0300 08/26/21 0106  NA 142 140 136 143  K 3.6 3.5 2.9* 4.8  CL 110 109 108 116*  CO2 21* 21* 18* 17*  GLUCOSE 110* 95 88 112*  BUN 54* 49* 43* 45*  CALCIUM 8.7* 8.5* 8.2* 8.8*  CREATININE 4.34* 3.77* 3.37* 3.17*  GFRNONAA 13* 15* 17* 18*    LIVER FUNCTION TESTS: Recent Labs    08/20/21 2340 08/23/21 0313 08/24/21 0242 08/25/21 0300  BILITOT 0.9 0.7 0.4 0.8  AST 17 15 12* 11*  ALT 7 6 <5 7  ALKPHOS 269* 280* 285* 378*  PROT 6.0* 6.1* 5.8* 6.0*  ALBUMIN 2.2* 2.0* 2.0* 2.0*    TUMOR MARKERS: No results for input(s): AFPTM, CEA, CA199, CHROMGRNA in the last 8760 hours.  Assessment and Plan:  Prostate cancer Hydronephrosis and high creatinine Dr Alinda Money following pt Cr is rending down nicely He is requesting internalization ureteral stents Pt is scheduled for Bilateral internalization Ureteral stents in IR Fri or Monday next week We will call Dr Alinda Money to touch base in am---- plan per his recommendation I have spoken to Pts brother Anthony Dudley--- he is agreeable to proceed Consent signed and in IR He is aware of procedure benefits and risks including  but not limited to Infection; bleeding; damage to ureter    Thank you for this interesting consult.  I greatly enjoyed meeting Anthony Dudley and look forward to participating in their care.  A copy of this report was sent to the requesting provider on this date.  Electronically Signed: Lavonia Drafts, PA-C 08/26/2021, 1:54 PM   I spent a total of 40 Minutes   in face to face in clinical consultation, greater than 50% of which was counseling/coordinating care for B Ureteral stent internalization

## 2021-08-26 NOTE — Plan of Care (Signed)
  Problem: Education: Goal: Knowledge of General Education information will improve Description: Including pain rating scale, medication(s)/side effects and non-pharmacologic comfort measures Outcome: Progressing   Problem: Health Behavior/Discharge Planning: Goal: Ability to manage health-related needs will improve Outcome: Progressing   Problem: Clinical Measurements: Goal: Ability to maintain clinical measurements within normal limits will improve Outcome: Progressing Goal: Will remain free from infection Outcome: Progressing Goal: Diagnostic test results will improve Outcome: Progressing Goal: Respiratory complications will improve Outcome: Progressing Goal: Cardiovascular complication will be avoided Outcome: Progressing   Problem: Activity: Goal: Risk for activity intolerance will decrease Outcome: Progressing   Problem: Nutrition: Goal: Adequate nutrition will be maintained Outcome: Progressing   Problem: Coping: Goal: Level of anxiety will decrease Outcome: Progressing   Problem: Elimination: Goal: Will not experience complications related to bowel motility Outcome: Progressing Goal: Will not experience complications related to urinary retention Outcome: Progressing   Problem: Pain Managment: Goal: General experience of comfort will improve Outcome: Progressing   Problem: Safety: Goal: Ability to remain free from injury will improve Outcome: Progressing   Problem: Skin Integrity: Goal: Risk for impaired skin integrity will decrease Outcome: Progressing   Problem: Education: Goal: Knowledge of disease and its progression will improve Outcome: Progressing Goal: Individualized Educational Video(s) Outcome: Progressing   Problem: Fluid Volume: Goal: Compliance with measures to maintain balanced fluid volume will improve Outcome: Progressing   Problem: Health Behavior/Discharge Planning: Goal: Ability to manage health-related needs will  improve Outcome: Progressing   Problem: Nutritional: Goal: Ability to make healthy dietary choices will improve Outcome: Progressing   Problem: Clinical Measurements: Goal: Complications related to the disease process, condition or treatment will be avoided or minimized Outcome: Progressing   Problem: Fluid Volume: Goal: Hemodynamic stability will improve Outcome: Progressing   Problem: Clinical Measurements: Goal: Diagnostic test results will improve Outcome: Progressing Goal: Signs and symptoms of infection will decrease Outcome: Progressing   Problem: Respiratory: Goal: Ability to maintain adequate ventilation will improve Outcome: Progressing

## 2021-08-26 NOTE — Progress Notes (Signed)
Patient ID: Anthony Dudley, male   DOB: Nov 01, 1933, 87 y.o.   MRN: [id number]  Reviewed IR notes.  No current plans to internalize ureteral stents per their notes.  Will discuss with them today as renal function begins to stabilize.

## 2021-08-27 DIAGNOSIS — E440 Moderate protein-calorie malnutrition: Secondary | ICD-10-CM

## 2021-08-27 DIAGNOSIS — C61 Malignant neoplasm of prostate: Secondary | ICD-10-CM | POA: Diagnosis not present

## 2021-08-27 DIAGNOSIS — T83511A Infection and inflammatory reaction due to indwelling urethral catheter, initial encounter: Secondary | ICD-10-CM | POA: Diagnosis not present

## 2021-08-27 DIAGNOSIS — N179 Acute kidney failure, unspecified: Secondary | ICD-10-CM | POA: Diagnosis not present

## 2021-08-27 LAB — CBC WITH DIFFERENTIAL/PLATELET
Abs Immature Granulocytes: 0.5 10*3/uL — ABNORMAL HIGH (ref 0.00–0.07)
Basophils Absolute: 0 10*3/uL (ref 0.0–0.1)
Basophils Relative: 0 %
Eosinophils Absolute: 0.2 10*3/uL (ref 0.0–0.5)
Eosinophils Relative: 1 %
HCT: 22.6 % — ABNORMAL LOW (ref 39.0–52.0)
Hemoglobin: 7.4 g/dL — ABNORMAL LOW (ref 13.0–17.0)
Immature Granulocytes: 5 %
Lymphocytes Relative: 20 %
Lymphs Abs: 2.2 10*3/uL (ref 0.7–4.0)
MCH: 31.2 pg (ref 26.0–34.0)
MCHC: 32.7 g/dL (ref 30.0–36.0)
MCV: 95.4 fL (ref 80.0–100.0)
Monocytes Absolute: 0.8 10*3/uL (ref 0.1–1.0)
Monocytes Relative: 7 %
Neutro Abs: 7.2 10*3/uL (ref 1.7–7.7)
Neutrophils Relative %: 67 %
Platelets: 76 10*3/uL — ABNORMAL LOW (ref 150–400)
RBC: 2.37 MIL/uL — ABNORMAL LOW (ref 4.22–5.81)
RDW: 19.2 % — ABNORMAL HIGH (ref 11.5–15.5)
WBC: 10.8 10*3/uL — ABNORMAL HIGH (ref 4.0–10.5)
nRBC: 0.2 % (ref 0.0–0.2)

## 2021-08-27 LAB — MAGNESIUM
Magnesium: 1.9 mg/dL (ref 1.7–2.4)
Magnesium: 1.8 mg/dL (ref 1.7–2.4)

## 2021-08-27 LAB — PHOSPHORUS
Phosphorus: 2.2 mg/dL — ABNORMAL LOW (ref 2.5–4.6)
Phosphorus: 2.3 mg/dL — ABNORMAL LOW (ref 2.5–4.6)

## 2021-08-27 MED ORDER — PROSOURCE TF PO LIQD
45.0000 mL | Freq: Every day | ORAL | Status: DC
  Filled 2021-08-27: qty 45

## 2021-08-27 MED ORDER — FREE WATER: 125.0000 mL | Status: DC

## 2021-08-27 MED ORDER — ADULT MULTIVITAMIN W/MINERALS CH
1.0000 | ORAL_TABLET | Freq: Every day | ORAL | Status: DC
  Administered 2021-09-01 – 2021-09-06 (×6): 1
  Filled 2021-08-27 (×6): qty 1

## 2021-08-27 MED ORDER — FREE WATER: 100.0000 mL | Status: DC

## 2021-08-27 MED ORDER — OSMOLITE 1.2 CAL PO LIQD
1000.0000 mL | ORAL | Status: DC
  Filled 2021-08-27: qty 1000

## 2021-08-27 NOTE — TOC Progression Note (Signed)
Transition of Care [facility]) - Progression Note    Patient Details  Name: Anthony Dudley MRN: [id number] Date of Birth: 1933/02/21  [facility] CM/SW Contact  Zenon Mayo, RN Phone Number: 08/27/2021, 4:35 PM  Clinical Narrative:    not waking up, IR to do stents today , also will put in cortrak due to not eating, prognosis is poor,  brother is POA.  TOC will continue to follow for dc needs.        Expected Discharge Plan and Services                                                 Social Determinants of Health (SDOH) Interventions    Readmission Risk Interventions No flowsheet data found.

## 2021-08-27 NOTE — Progress Notes (Addendum)
Nutrition Follow-up  DOCUMENTATION CODES:  Non-severe (moderate) malnutrition in context of chronic illness  INTERVENTION:  Place Cortrak tube.  Initiate tube feeding via Cortrak: Osmolite 1.2 at 30 ml/h and increase by 10 ml every 8 hrs to goal rate of 70 ml/h (1680 ml per day) Prosource TF 45 ml daily  Free water flushes 125 ml q 4 hrs.  Provides 2056 kcal, 104 gm protein, 2127 ml free water daily.  Monitor magnesium, potassium, and phosphorus daily for at least 3 days, MD to replete as needed, as pt is at risk for refeeding syndrome.  Continue MVI with minerals daily per tube.  NUTRITION DIAGNOSIS:  Moderate Malnutrition related to chronic illness (dementia) as evidenced by mild fat depletion, moderate fat depletion, mild muscle depletion, moderate muscle depletion. - ongoing  GOAL:  Patient will meet greater than or equal to 90% of their needs - to be met with TF  MONITOR:  TF tolerance, Labs, Weight trends, Skin, I & O's  REASON FOR ASSESSMENT:  Low Braden    ASSESSMENT:  Anthony Dudley is a 87 y.o. male with medical history significant of prostate cancer, CKD stage III, chronic indwelling Foley catheter, MDR UTI, dementia, bedbound at baseline, chronic diastolic CHF, hypertension, hyperlipidemia COVID infection in June 2020. He was sent to the ED from his nursing home for evaluation of worsening kidney function.  His creatinine was 2.5 on 07/09/21, 5.7 on 08/09/21, and now 6.1 on 08/16/21.  His hemoglobin was 6.6 on 08/10/21 and he received 2 units PRBCs at [facility], hemoglobin 9.0 at his facility on 08/16/21.  Spoke with MD over secure chat. Brother wanting to continue full scope of care.   RD placed order for Cortrak tube placement and tube feeding recommendations. Pt at risk for refeeding syndrome given poor oral intake.  Oral intake remains inadequate. No intake recorded since 10/11 in flowsheets.  Admit wt: 80.1 kg Current wt: 80.7 kg  Medications:  reviewed; MVI with minerals, Mag-Ox BID, Na Bicarb 150 mEq with D5 @ 75 ml/hr  Labs: reviewed; Glucose 112 (H), BUN 45 (H), Crt 3.17 (H - trending down)  Diet Order:   Diet Order             Diet NPO time specified Except for: Sips with Meds  Diet effective midnight                  EDUCATION NEEDS:  Not appropriate for education at this time  Skin:  Skin Assessment: Skin Integrity Issues: Skin Integrity Issues:: Incisions Incisions: closed penis  Last BM:  08/26/21 - Type 6, medium  Height:  Ht Readings from Last 1 Encounters:  08/20/21 '5\' 9"'  (1.753 m)   Weight:  Wt Readings from Last 1 Encounters:  08/27/21 80.7 kg   BMI:  Body mass index is 26.27 kg/m.  Estimated Nutritional Needs:  Kcal:  2000-2200 Protein:  95-110 grams Fluid:  > 2 L  Derrel Nip, RD, LDN (she/her/hers) Registered Dietitian I After-Hours/Weekend Pager # in [place]

## 2021-08-27 NOTE — Progress Notes (Signed)
Pt was NPO all day for procedure, diet change for supper but pt refused to eat/forgets to swallow.

## 2021-08-27 NOTE — Progress Notes (Signed)
Cortrak Tube Team Note:  Consult received to place a Cortrak feeding tube.  Cortrak tube attempted, but unsuccessful due to pt not tolerating procedure well. And unable to advance tube past the G-E junction. RN notified and plans to communicate with MD.    Anthony Dudley BS, PLDN Clinical Dietitian See [facility] for contact information.

## 2021-08-27 NOTE — Plan of Care (Signed)
  Problem: Education: Goal: Knowledge of General Education information will improve Description: Including pain rating scale, medication(s)/side effects and non-pharmacologic comfort measures Outcome: Progressing   Problem: Health Behavior/Discharge Planning: Goal: Ability to manage health-related needs will improve Outcome: Progressing   Problem: Clinical Measurements: Goal: Ability to maintain clinical measurements within normal limits will improve Outcome: Progressing Goal: Will remain free from infection Outcome: Progressing Goal: Diagnostic test results will improve Outcome: Progressing Goal: Respiratory complications will improve Outcome: Progressing Goal: Cardiovascular complication will be avoided Outcome: Progressing   Problem: Activity: Goal: Risk for activity intolerance will decrease Outcome: Progressing   Problem: Nutrition: Goal: Adequate nutrition will be maintained Outcome: Progressing   Problem: Coping: Goal: Level of anxiety will decrease Outcome: Progressing   Problem: Elimination: Goal: Will not experience complications related to bowel motility Outcome: Progressing Goal: Will not experience complications related to urinary retention Outcome: Progressing   Problem: Pain Managment: Goal: General experience of comfort will improve Outcome: Progressing   Problem: Safety: Goal: Ability to remain free from injury will improve Outcome: Progressing   Problem: Skin Integrity: Goal: Risk for impaired skin integrity will decrease Outcome: Progressing   Problem: Education: Goal: Knowledge of disease and its progression will improve Outcome: Progressing Goal: Individualized Educational Video(s) Outcome: Progressing   Problem: Fluid Volume: Goal: Compliance with measures to maintain balanced fluid volume will improve Outcome: Progressing   Problem: Health Behavior/Discharge Planning: Goal: Ability to manage health-related needs will  improve Outcome: Progressing   Problem: Nutritional: Goal: Ability to make healthy dietary choices will improve Outcome: Progressing   Problem: Clinical Measurements: Goal: Complications related to the disease process, condition or treatment will be avoided or minimized Outcome: Progressing   Problem: Fluid Volume: Goal: Hemodynamic stability will improve Outcome: Progressing   Problem: Clinical Measurements: Goal: Diagnostic test results will improve Outcome: Progressing Goal: Signs and symptoms of infection will decrease Outcome: Progressing   Problem: Respiratory: Goal: Ability to maintain adequate ventilation will improve Outcome: Progressing

## 2021-08-27 NOTE — Progress Notes (Signed)
IR was requested for internalize bilateral PCNs.   The procedure was tentatively scheduled for today, however, IR was not able to accommodate the procedure today due to scheduled outpatient procedures/urgent cases.   RN/MD notified, made npo at Monday midnight.   The procedure is tentatively scheduled for Monday 10/17 pending IR schedule.  Please call IR for questions and concerns.   Armando Gang Jerrion Tabbert PA-C 08/27/2021 4:03 PM

## 2021-08-27 NOTE — Progress Notes (Signed)
PROGRESS NOTE    Anthony Dudley  CWC:[id number] DOB: Jun 13, 1933 DOA: 08/17/2021 PCP: [name], MD    Brief Narrative:  87 yo with hx of metastatic prostate cancer, chronic indwelling foley, CKD stage III, hx MDR UTI, dementia, chronic bedbound status, HFpEF, HTN and multiple other medical problems presenting with AKI on CKD from the nursing home.    Creatinine reportedly 2.5 on 8/26.   Cr reportedly 6.1 on 10/3.  He was recently transfused with a HB of 6.6 at [facility] on 08/10/21.     He was admitted with sepsis 2/2 UTI and AKI on CKD.  Urology was consulted due to hydronephrosis.  He had cystoscopy and fulguration of bladder on 10/5 with failed attempt at stent placement.  IR was consulted for bilateral nephrostomy tubes which were placed on 10/6.  His hospitalization was complicated by SVT and he was started on amiodarone and cardiology and PCCM were consulted due to hypotension. His Renal functions gradually improving but remains very debilitated and frail with failure to thrive.  Urology continuing to follow.  His brother has been involved in care and wants to be full code.   Assessment & Plan:   Principal Problem:   Acute-on-chronic kidney injury ([place]) Active Problems:   Metabolic acidosis   Sepsis ([place])   UTI (urinary tract infection) due to urinary indwelling catheter ([facility])   Prostate cancer ([facility])   Malnutrition of moderate degree  Acute kidney injury on chronic kidney disease stage IIIa with bilateral hydronephrosis/metabolic acidosis/hypernatremia: Reported baseline creatinine 2.5, presented with creatinine of 6.1. Unable to do cystoscopy and ureteroscopy, therefore patient underwent bilateral percutaneous nephrostomy by interventional radiology. He has had some improvement in renal function, however patient overall remains in very poor and debilitated condition.  Followed by urology/IR for possible conversion to internal stents and suprapubic catheter  placement.  Possible procedure today.  Sepsis secondary to UTI present on admission due to chronic indwelling Foley catheter, bilateral multifocal pneumonia: Presented with tachypnea, tachycardia and leukocytosis.  Prior history of MDR Proteus sensitive to cefepime. Treated with cefepime and vancomycin, completed 7 days of therapy to cover for both UTI and pneumonia. Foley exchanged in the emergency room.  Completed antibiotics.  Acute on chronic anemia of blood loss: Patient with multiple chronic illnesses.  Hemoglobin 6.6 on 9/27, 2 units of PRBC transfusion.  Remains fairly stable since then.  Acute hypoxemic respiratory failure: With severe multilobular bilateral bronchopneumonia and also suspect renal failure.  He has completed antibiotic therapy.  Echocardiogram with normal ejection fraction.  Repeat chest x-ray with bilateral pulmonary edema.  Patient is needing some IV fluids because he is not able to keep up any oral nutrition.  Keep on oxygen to keep sats more than 90%.  Electrolytes: Adequate today.  SVT/hypotension: Currently in sinus rhythm.  Initially started on amiodarone, but that has since been discontinued.  Echocardiogram with normal ejection fraction.  TSH normal.  Blood pressures and heart rate normalized.  Failure to thrive/metastatic prostate cancer/bedbound status/severe protein calorie malnutrition/dementia Patient with advanced debility and frailty.  He has poor quality of life and less likely to have a meaningful recovery with rehab. Palliative care did attempt discussion regarding overall goals with patient's brother, but it did not appear that he was ready to have that conversation at this time His brother wishes to continue full code/full measures at this time. Patient is not eating.  Will start on tube feeding today with core track after the procedure.  Overall prognosis remains very  poor and survival is poor from this episode. His brother was not able to pick up  the phone, will try to connect to him again.    DVT prophylaxis: Place and maintain sequential compression device Start: 08/21/21 0754 SCDs Start: 08/18/21 0235   Code Status: Full code Family Communication: Unable to reach brother over the phone Disposition Plan: Status is: Inpatient  Remains inpatient appropriate because:Persistent severe electrolyte disturbances, IV treatments appropriate due to intensity of illness or inability to take PO, and Inpatient level of care appropriate due to severity of illness  Dispo: The patient is from: SNF              Anticipated d/c is to: SNF              Patient currently is not medically stable to d/c.   Difficult to place patient No         Consultants:  Urology PCCM Cardiology Palliative care Interventional radiology  Procedures:  Cystoscopy Bilateral percutaneous nephrostomy  Antimicrobials:  Vancomycin and cefepime, completed therapy.   Subjective: Patient seen and examined.  Closed eyes.  No response.  Looks very lethargic and tired.  He is n.p.o. today but he has not eaten anything meaningful for last many days.  Objective: Vitals:   08/26/21 1900 08/27/21 0000 08/27/21 0400 08/27/21 0700  BP: (!) 100/46 (!) 111/57 111/65 118/63  Pulse: 88 98 95 98  Resp: (!) 26 (!) 27 (!) 24 (!) 23  Temp: 98.4 F (36.9 C) 97.6 F (36.4 C) 98.4 F (36.9 C) 98 F (36.7 C)  TempSrc: Oral Oral Oral Oral  SpO2: 99% 99% 99% 99%  Weight:   80.7 kg   Height:        Intake/Output Summary (Last 24 hours) at 08/27/2021 1111 Last data filed at 08/27/2021 0600 Gross per 24 hour  Intake 602.46 ml  Output 1820 ml  Net -1217.54 ml   Filed Weights   08/25/21 0020 08/26/21 0016 08/27/21 0400  Weight: 83.1 kg 81.1 kg 80.7 kg    Examination:  General exam: Appears frail and debilitated , sick looking, keeps eyes closed, does not answer questions.  Respiratory system: scattered rhonchi. On 4 liters nasal canula. Cardiovascular  system: S1 & S2 heard, RRR.  Gastrointestinal system: Abdomen is nondistended, soft and nontender. No organomegaly or masses felt. Normal bowel sounds heard. R PCN with clear urine Left PCN with bloody urine  Foley with small amount of clear urine .    Data Reviewed: I have personally reviewed following labs and imaging studies  CBC: Recent Labs  Lab 08/23/21 0313 08/24/21 0242 08/25/21 0300 08/26/21 0106 08/27/21 0316  WBC 12.0* 10.1 10.9* 12.1* 10.8*  NEUTROABS 9.4* 6.7 7.2 7.8* 7.2  HGB 8.1* 8.5* 8.6* 8.7* 7.4*  HCT 25.2* 26.2* 26.5* 27.3* 22.6*  MCV 95.8 94.2 95.7 95.8 95.4  PLT 86* 83* 74* 80* 76*   Basic Metabolic Panel: Recent Labs  Lab 08/20/21 2340 08/21/21 1122 08/21/21 2010 08/22/21 0204 08/22/21 1641 08/23/21 0313 08/24/21 0242 08/25/21 0300 08/26/21 0106  NA 148*   < > 149*   < > 144 142 140 136 143  K 3.7   < > 3.2*   < > 3.5 3.6 3.5 2.9* 4.8  CL 113*   < > 115*   < > 114* 110 109 108 116*  CO2 19*   < > 17*   < > 17* 21* 21* 18* 17*  GLUCOSE 99   < > 177*   < >  132* 110* 95 88 112*  BUN 73*   < > 69*   < > 58* 54* 49* 43* 45*  CREATININE 5.98*   < > 5.46*   < > 4.65* 4.34* 3.77* 3.37* 3.17*  CALCIUM 8.4*   < > 8.4*   < > 8.5* 8.7* 8.5* 8.2* 8.8*  MG 1.7  --  1.8  --   --  1.6* 1.6* 1.9 2.2  PHOS 4.3  --   --   --   --  3.0 3.2 3.4 2.9   < > = values in this interval not displayed.   GFR: Estimated Creatinine Clearance: 16.7 mL/min (A) (by C-G formula based on SCr of 3.17 mg/dL (H)). Liver Function Tests: Recent Labs  Lab 08/20/21 2340 08/23/21 0313 08/24/21 0242 08/25/21 0300  AST 17 15 12* 11*  ALT 7 6 <5 7  ALKPHOS 269* 280* 285* 378*  BILITOT 0.9 0.7 0.4 0.8  PROT 6.0* 6.1* 5.8* 6.0*  ALBUMIN 2.2* 2.0* 2.0* 2.0*   No results for input(s): LIPASE, AMYLASE in the last 168 hours. No results for input(s): AMMONIA in the last 168 hours. Coagulation Profile: No results for input(s): INR, PROTIME in the last 168 hours. Cardiac  Enzymes: No results for input(s): CKTOTAL, CKMB, CKMBINDEX, TROPONINI in the last 168 hours. BNP (last 3 results) No results for input(s): PROBNP in the last 8760 hours. HbA1C: No results for input(s): HGBA1C in the last 72 hours. CBG: No results for input(s): GLUCAP in the last 168 hours. Lipid Profile: No results for input(s): CHOL, HDL, LDLCALC, TRIG, CHOLHDL, LDLDIRECT in the last 72 hours. Thyroid Function Tests: No results for input(s): TSH, T4TOTAL, FREET4, T3FREE, THYROIDAB in the last 72 hours. Anemia Panel: No results for input(s): VITAMINB12, FOLATE, FERRITIN, TIBC, IRON, RETICCTPCT in the last 72 hours. Sepsis Labs: No results for input(s): PROCALCITON, LATICACIDVEN in the last 168 hours.   Recent Results (from the past 240 hour(s))  Urine Culture     Status: Abnormal   Collection Time: 08/17/21 11:16 PM   Specimen: Urine, Catheterized  Result Value Ref Range Status   Specimen Description URINE, CATHETERIZED  Final   Special Requests   Final    NONE Performed at [facility], [address], [place], [place] [address]    Culture MULTIPLE SPECIES PRESENT, SUGGEST RECOLLECTION (A)  Final   Report Status 08/18/2021 FINAL  Final  Resp Panel by RT-PCR (Flu A&B, Covid) Nasopharyngeal Swab     Status: None   Collection Time: 08/17/21 11:19 PM   Specimen: Nasopharyngeal Swab; Nasopharyngeal(NP) swabs in vial transport medium  Result Value Ref Range Status   SARS Coronavirus 2 by RT PCR NEGATIVE NEGATIVE Final    Comment: (NOTE) SARS-CoV-2 target nucleic acids are NOT DETECTED.  The SARS-CoV-2 RNA is generally detectable in upper respiratory specimens during the acute phase of infection. The lowest concentration of SARS-CoV-2 viral copies this assay can detect is 138 copies/mL. A negative result does not preclude SARS-Cov-2 infection and should not be used as the sole basis for treatment or other patient management decisions. A negative result may occur with   improper specimen collection/handling, submission of specimen other than nasopharyngeal swab, presence of viral mutation(s) within the areas targeted by this assay, and inadequate number of viral copies(<138 copies/mL). A negative result must be combined with clinical observations, patient history, and epidemiological information. The expected result is Negative.  Fact Sheet for Patients:  EntrepreneurPulse.com.au  Fact Sheet for Healthcare Providers:  IncredibleEmployment.be  This test is no t yet approved or cleared by the [place] and  has been authorized for detection and/or diagnosis of SARS-CoV-2 by FDA under an Emergency Use Authorization (EUA). This EUA will remain  in effect (meaning this test can be used) for the duration of the COVID-19 declaration under Section 564(b)(1) of the Act, 21 U.S.C.section 360bbb-3(b)(1), unless the authorization is terminated  or revoked sooner.       Influenza A by PCR NEGATIVE NEGATIVE Final   Influenza B by PCR NEGATIVE NEGATIVE Final    Comment: (NOTE) The Xpert Xpress SARS-CoV-2/FLU/RSV plus assay is intended as an aid in the diagnosis of influenza from Nasopharyngeal swab specimens and should not be used as a sole basis for treatment. Nasal washings and aspirates are unacceptable for Xpert Xpress SARS-CoV-2/FLU/RSV testing.  Fact Sheet for Patients: EntrepreneurPulse.com.au  Fact Sheet for Healthcare Providers: IncredibleEmployment.be  This test is not yet approved or cleared by the [place] FDA and has been authorized for detection and/or diagnosis of SARS-CoV-2 by FDA under an Emergency Use Authorization (EUA). This EUA will remain in effect (meaning this test can be used) for the duration of the COVID-19 declaration under Section 564(b)(1) of the Act, 21 U.S.C. section 360bbb-3(b)(1), unless the authorization is terminated  or revoked.  Performed at [facility], [place] [address] [place], [place] [address]   Culture, blood (routine x 2)     Status: None   Collection Time: 08/18/21  2:55 AM   Specimen: BLOOD  Result Value Ref Range Status   Specimen Description BLOOD RIGHT ANTECUBITAL  Final   Special Requests   Final    BOTTLES DRAWN AEROBIC AND ANAEROBIC Blood Culture adequate volume   Culture   Final    NO GROWTH 5 DAYS Performed at [facility], [place] [address] [place], [place] [address]    Report Status 08/23/2021 FINAL  Final  Culture, blood (routine x 2)     Status: None   Collection Time: 08/18/21  3:01 AM   Specimen: BLOOD  Result Value Ref Range Status   Specimen Description BLOOD BLOOD RIGHT WRIST  Final   Special Requests   Final    BOTTLES DRAWN AEROBIC AND ANAEROBIC Blood Culture adequate volume   Culture   Final    NO GROWTH 5 DAYS Performed at [facility], [place] [address] [place], [place] [address]    Report Status 08/23/2021 FINAL  Final  MRSA Next Gen by PCR, Nasal     Status: Abnormal   Collection Time: 08/19/21  3:50 PM   Specimen: Nasal Mucosa; Nasal Swab  Result Value Ref Range Status   MRSA by PCR Next Gen DETECTED (A) NOT DETECTED Final    Comment: RESULT CALLED TO, READ BACK BY AND VERIFIED WITH: K DUFFY RN [id number] 08/19/21 A BROWNING (NOTE) The GeneXpert MRSA Assay (FDA approved for NASAL specimens only), is one component of a comprehensive MRSA colonization surveillance program. It is not intended to diagnose MRSA infection nor to guide or monitor treatment for MRSA infections. Test performance is not FDA approved in patients less than 43 years old. Performed at [facility], [place] [address], [place], [place] [address]          Radiology Studies: DG CHEST PORT 1 VIEW  Result Date: 08/26/2021 CLINICAL DATA:  Shortness of breath. EXAM: PORTABLE CHEST 1 VIEW COMPARISON:  August 21, 2021 FINDINGS: Mild, bilateral multifocal infiltrates are  seen.  This is decreased in severity when compared to the prior study. There is no evidence of a pleural effusion or pneumothorax. The heart size and mediastinal contours are within normal limits. Moderate to marked severity calcification the aortic arch is noted. Diffusely sclerotic osseous structures are seen. IMPRESSION: Mild, bilateral multifocal infiltrates, decreased in severity when compared to the prior study. Electronically Signed   By: Virgina Norfolk M.D.   On: 08/26/2021 20:48        Scheduled Meds:  sodium chloride   Intravenous Once   sodium chloride   Intravenous Once   Chlorhexidine Gluconate Cloth  6 each Topical Daily   feeding supplement (PROSource TF)  45 mL Per Tube Daily   free water  100 mL Per Tube Q4H   magnesium oxide  400 mg Oral BID   [START ON 08/28/2021] multivitamin with minerals  1 tablet Per Tube Daily   oxybutynin  5 mg Oral QHS   Continuous Infusions:  cefTRIAXone (ROCEPHIN)  IV     feeding supplement (OSMOLITE 1.2 CAL)     lactated ringers     sodium bicarbonate 150 mEq in D5W infusion 75 mL/hr at 08/27/21 0356   sodium chloride irrigation       LOS: 9 days    Time spent: 34 minutes    Barb Merino, MD [facility]

## 2021-08-27 NOTE — Progress Notes (Addendum)
Patient ID: Anthony Dudley, male   DOB: January 14, 1933, 87 y.o.   MRN: [id number]  9 Days Post-Op Subjective: No changes.  According to his brother, he ate a full meal when his brother fed him yesterday.  Objective: Vital signs in last 24 hours: Temp:  [97.6 F (36.4 C)-99.2 F (37.3 C)] 98.7 F (37.1 C) (10/14 1645) Pulse Rate:  [88-102] 94 (10/14 1645) Resp:  [20-27] 25 (10/14 1645) BP: (100-126)/(46-67) 114/58 (10/14 1645) SpO2:  [99 %-100 %] 99 % (10/14 1645) Weight:  [80.7 kg] 80.7 kg (10/14 0400)  Intake/Output from previous day: 10/13 0701 - 10/14 0700 In: 722.5 [P.O.:360; I.V.:362.5] Out: 1820 [Urine:1820] Intake/Output this shift: No intake/output data recorded.  Physical Exam:  General: Alert and oriented GU: B PCNs in place with clear urine  Lab Results: Recent Labs    08/25/21 0300 08/26/21 0106 08/27/21 0316  HGB 8.6* 8.7* 7.4*  HCT 26.5* 27.3* 22.6*   CBC Latest Ref Rng & Units 08/27/2021 08/26/2021 08/25/2021  WBC 4.0 - 10.5 K/uL 10.8(H) 12.1(H) 10.9(H)  Hemoglobin 13.0 - 17.0 g/dL 7.4(L) 8.7(L) 8.6(L)  Hematocrit 39.0 - 52.0 % 22.6(L) 27.3(L) 26.5(L)  Platelets 150 - 400 K/uL 76(L) 80(L) 74(L)    BMET Recent Labs    08/25/21 0300 08/26/21 0106  NA 136 143  K 2.9* 4.8  CL 108 116*  CO2 18* 17*  GLUCOSE 88 112*  BUN 43* 45*  CREATININE 3.37* 3.17*  CALCIUM 8.2* 8.8*     Studies/Results: DG CHEST PORT 1 VIEW  Result Date: 08/26/2021 CLINICAL DATA:  Shortness of breath. EXAM: PORTABLE CHEST 1 VIEW COMPARISON:  August 21, 2021 FINDINGS: Mild, bilateral multifocal infiltrates are seen. This is decreased in severity when compared to the prior study. There is no evidence of a pleural effusion or pneumothorax. The heart size and mediastinal contours are within normal limits. Moderate to marked severity calcification the aortic arch is noted. Diffusely sclerotic osseous structures are seen. IMPRESSION: Mild, bilateral multifocal infiltrates, decreased  in severity when compared to the prior study. Electronically Signed   By: Virgina Norfolk M.D.   On: 08/26/2021 20:48    Assessment/Plan: Bilateral hydronephrosis/AKI/sepsis:  S/P bilateral nephrostomy tube placement.  Renal function still improving.  Plan for internalization of bilateral ureteral stents for Monday. Metastatic prostate cancer: PSA significantly elevated consistent with probable widespread metastatic prostate cancer. CT scan without lymphadenopathy but with extensive sclerotic bone metastases.  Will consider bone scan once acute issues stabilized to further assess extent of disease.  Will need treatment escalation eventually with androgen receptor blockade/androgen synthesis inhibition as outpatient.  Started ADT with degarelix on 10/6.  Will arrange ongoing therapy as an outpatient.  Will order a bone scan for further staging. Urethral erosion/urinary retention:  Continue Foley for now.  Will consider SP tube placement eventually (likely at time of next stent change when he gets general anesthesia).  Will likely have minimal output from urethral catheter with nephrostomy tubes in place until antegrade stents placed.  Pt's brother/POA, Ben, updated this evening.     LOS: 9 days   Dutch Gray 08/27/2021, 6:24 PM

## 2021-08-28 ENCOUNTER — Inpatient Hospital Stay (HOSPITAL_COMMUNITY): Payer: Medicare Other

## 2021-08-28 DIAGNOSIS — C61 Malignant neoplasm of prostate: Secondary | ICD-10-CM | POA: Diagnosis not present

## 2021-08-28 DIAGNOSIS — E440 Moderate protein-calorie malnutrition: Secondary | ICD-10-CM | POA: Diagnosis not present

## 2021-08-28 DIAGNOSIS — N179 Acute kidney failure, unspecified: Secondary | ICD-10-CM | POA: Diagnosis not present

## 2021-08-28 DIAGNOSIS — T83511A Infection and inflammatory reaction due to indwelling urethral catheter, initial encounter: Secondary | ICD-10-CM | POA: Diagnosis not present

## 2021-08-28 LAB — CBC WITH DIFFERENTIAL/PLATELET
Abs Immature Granulocytes: 0.42 10*3/uL — ABNORMAL HIGH (ref 0.00–0.07)
Basophils Absolute: 0 10*3/uL (ref 0.0–0.1)
Basophils Relative: 0 %
Eosinophils Absolute: 0.2 10*3/uL (ref 0.0–0.5)
Eosinophils Relative: 2 %
HCT: 24.4 % — ABNORMAL LOW (ref 39.0–52.0)
Hemoglobin: 7.7 g/dL — ABNORMAL LOW (ref 13.0–17.0)
Immature Granulocytes: 4 %
Lymphocytes Relative: 24 %
Lymphs Abs: 2.6 10*3/uL (ref 0.7–4.0)
MCH: 30.7 pg (ref 26.0–34.0)
MCHC: 31.6 g/dL (ref 30.0–36.0)
MCV: 97.2 fL (ref 80.0–100.0)
Monocytes Absolute: 0.8 10*3/uL (ref 0.1–1.0)
Monocytes Relative: 7 %
Neutro Abs: 7 10*3/uL (ref 1.7–7.7)
Neutrophils Relative %: 63 %
Platelets: 87 10*3/uL — ABNORMAL LOW (ref 150–400)
RBC: 2.51 MIL/uL — ABNORMAL LOW (ref 4.22–5.81)
RDW: 18.6 % — ABNORMAL HIGH (ref 11.5–15.5)
WBC: 11 10*3/uL — ABNORMAL HIGH (ref 4.0–10.5)
nRBC: 0.4 % — ABNORMAL HIGH (ref 0.0–0.2)

## 2021-08-28 LAB — GLUCOSE, CAPILLARY
Glucose-Capillary: 112 mg/dL — ABNORMAL HIGH (ref 70–99)
Glucose-Capillary: 112 mg/dL — ABNORMAL HIGH (ref 70–99)

## 2021-08-28 LAB — MAGNESIUM
Magnesium: 1.9 mg/dL (ref 1.7–2.4)
Magnesium: 1.9 mg/dL (ref 1.7–2.4)

## 2021-08-28 LAB — PHOSPHORUS
Phosphorus: 2.7 mg/dL (ref 2.5–4.6)
Phosphorus: 3 mg/dL (ref 2.5–4.6)

## 2021-08-28 NOTE — Progress Notes (Signed)
NG tube placement attempted, unsuccessful. Tube removed after KUB confirmation from MD.

## 2021-08-28 NOTE — Progress Notes (Signed)
PROGRESS NOTE    Anthony Dudley  SWH:[id number] DOB: October 04, 1933 DOA: 08/17/2021 PCP: [name], MD    Brief Narrative:  87 yo with hx of metastatic prostate cancer, chronic indwelling foley, CKD stage III, hx MDR UTI, dementia, chronic bedbound status, HFpEF, HTN and multiple other medical problems presenting with AKI on CKD from the nursing home.    Creatinine reportedly 2.5 on 8/26.   Cr reportedly 6.1 on 10/3.  He was recently transfused with a HB of 6.6 at [facility] on 08/10/21.     He was admitted with sepsis 2/2 UTI and AKI on CKD.  Urology was consulted due to hydronephrosis.  He had cystoscopy and fulguration of bladder on 10/5 with failed attempt at stent placement.  IR was consulted for bilateral nephrostomy tubes which were placed on 10/6.  His hospitalization was complicated by SVT and he was started on amiodarone and cardiology and PCCM were consulted due to hypotension. His Renal functions gradually improving but remains very debilitated and frail with failure to thrive.  Urology continuing to follow.  His brother has been involved in care and wants to be full code.   Assessment & Plan:   Principal Problem:   Acute-on-chronic kidney injury ([place]) Active Problems:   Metabolic acidosis   Sepsis ([place])   UTI (urinary tract infection) due to urinary indwelling catheter ([facility])   Prostate cancer ([facility])   Malnutrition of moderate degree  Acute kidney injury on chronic kidney disease stage IIIa with bilateral hydronephrosis/metabolic acidosis/hypernatremia: Reported baseline creatinine 2.5, presented with creatinine of 6.1. Unable to do cystoscopy and ureteroscopy, therefore patient underwent bilateral percutaneous nephrostomy by interventional radiology. He has had some improvement in renal function, however patient overall remains in very poor and debilitated condition.  Followed by urology/IR for possible conversion to internal stents and suprapubic catheter  placement.  Procedure planned for 10/17. Total 1400 output from both nephrostomy.  Foley catheter has no significant amount of urine.  Sepsis secondary to UTI present on admission due to chronic indwelling Foley catheter, bilateral multifocal pneumonia: Presented with tachypnea, tachycardia and leukocytosis.  Prior history of MDR Proteus sensitive to cefepime. Treated with cefepime and vancomycin, completed 7 days of therapy to cover for both UTI and pneumonia. Foley exchanged in the emergency room.  Completed antibiotics.  Acute on chronic anemia of blood loss: Patient with multiple chronic illnesses.  Hemoglobin 6.6 on 9/27, 2 units of PRBC transfusion.  Remains fairly stable since then.  Intermittently checking.  Acute hypoxemic respiratory failure: With severe multilobular bilateral bronchopneumonia and also suspect renal failure.  He has completed antibiotic therapy.  Echocardiogram with normal ejection fraction.  Repeat chest x-ray with bilateral pulmonary edema.  Patient is needing some IV fluids because he is not able to keep up any oral nutrition.  Keep on oxygen to keep sats more than 90%.  Electrolytes: Adequate today.  SVT/hypotension: Currently in sinus rhythm.  Initially started on amiodarone, but that has since been discontinued.  Echocardiogram with normal ejection fraction.  TSH normal.  Blood pressures and heart rate normalized.  Goal of care:  Failure to thrive/metastatic prostate cancer/bedbound status/severe protein calorie malnutrition/dementia Patient with advanced debility and frailty.  He has poor quality of life and less likely to have a meaningful recovery and rehab. Palliative care did attempt discussion regarding overall goals with patient's brother, but it did not appear that he was ready to have that conversation at this time His brother wishes to continue full code/full measures at this  time. Patient has no meaningful calorie intake. Core track tube attempted  10/14, unsuccessful. Bedside Dobbhoff tube attempted 10/15, unsuccessful.  Radiology only able to do fluoroscopy guidance feeding tube placement on 10/17. I am doubtful that patient is in any condition to go for surgical intervention or chemotherapy.   DVT prophylaxis: Place and maintain sequential compression device Start: 08/21/21 0754 SCDs Start: 08/18/21 0235   Code Status: Full code Family Communication: Brother on the phone 10/14 Disposition Plan: Status is: Inpatient  Remains inpatient appropriate because:Persistent severe electrolyte disturbances, IV treatments appropriate due to intensity of illness or inability to take PO, and Inpatient level of care appropriate due to severity of illness  Dispo: The patient is from: SNF              Anticipated d/c is to: SNF              Patient currently is not medically stable to d/c.   Difficult to place patient No         Consultants:  Urology PCCM Cardiology Palliative care Interventional radiology  Procedures:  Cystoscopy Bilateral percutaneous nephrostomy  Antimicrobials:  Vancomycin and cefepime, completed therapy.   Subjective: Seen and examined.  Keeps eyes closed.  Tries to answer yes and no but no more than that.  Just says yes.  Objective: Vitals:   08/27/21 2300 08/28/21 0500 08/28/21 0750 08/28/21 0800  BP: 129/70  (!) 117/57 117/66  Pulse: 99  (!) 108 (!) 107  Resp: 19  18 20   Temp: 98.7 F (37.1 C)  98.6 F (37 C)   TempSrc: Oral  Oral   SpO2: 99%  99% 98%  Weight:  82 kg    Height:        Intake/Output Summary (Last 24 hours) at 08/28/2021 1116 Last data filed at 08/28/2021 0500 Gross per 24 hour  Intake --  Output 1410 ml  Net -1410 ml   Filed Weights   08/26/21 0016 08/27/21 0400 08/28/21 0500  Weight: 81.1 kg 80.7 kg 82 kg    Examination:  General exam: Appears frail and debilitated , sick looking, keeps eyes closed, does not answer questions.  Respiratory system: scattered  rhonchi. On 4 liters nasal canula. Cardiovascular system: S1 & S2 heard, RRR.  Gastrointestinal system: Abdomen is nondistended, soft and nontender. No organomegaly or masses felt. Normal bowel sounds heard. R PCN with clear urine Left PCN with bloody urine  Foley with small amount of clear urine .    Data Reviewed: I have personally reviewed following labs and imaging studies  CBC: Recent Labs  Lab 08/24/21 0242 08/25/21 0300 08/26/21 0106 08/27/21 0316 08/28/21 0434  WBC 10.1 10.9* 12.1* 10.8* 11.0*  NEUTROABS 6.7 7.2 7.8* 7.2 7.0  HGB 8.5* 8.6* 8.7* 7.4* 7.7*  HCT 26.2* 26.5* 27.3* 22.6* 24.4*  MCV 94.2 95.7 95.8 95.4 97.2  PLT 83* 74* 80* 76* 87*   Basic Metabolic Panel: Recent Labs  Lab 08/21/21 2010 08/22/21 1641 08/23/21 0313 08/24/21 0242 08/25/21 0300 08/26/21 0106 08/27/21 1116 08/27/21 1649 08/28/21 0434  NA  --  144 142 140 136 143  --   --   --   K  --  3.5 3.6 3.5 2.9* 4.8  --   --   --   CL  --  114* 110 109 108 116*  --   --   --   CO2  --  17* 21* 21* 18* 17*  --   --   --  GLUCOSE  --  132* 110* 95 88 112*  --   --   --   BUN  --  58* 54* 49* 43* 45*  --   --   --   CREATININE  --  4.65* 4.34* 3.77* 3.37* 3.17*  --   --   --   CALCIUM  --  8.5* 8.7* 8.5* 8.2* 8.8*  --   --   --   MG  --   --  1.6* 1.6* 1.9 2.2 1.9 1.8 1.9  PHOS   < >  --  3.0 3.2 3.4 2.9 2.2* 2.3* 2.7   < > = values in this interval not displayed.   GFR: Estimated Creatinine Clearance: 16.7 mL/min (A) (by C-G formula based on SCr of 3.17 mg/dL (H)). Liver Function Tests: Recent Labs  Lab 08/23/21 0313 08/24/21 0242 08/25/21 0300  AST 15 12* 11*  ALT 6 <5 7  ALKPHOS 280* 285* 378*  BILITOT 0.7 0.4 0.8  PROT 6.1* 5.8* 6.0*  ALBUMIN 2.0* 2.0* 2.0*   No results for input(s): LIPASE, AMYLASE in the last 168 hours. No results for input(s): AMMONIA in the last 168 hours. Coagulation Profile: No results for input(s): INR, PROTIME in the last 168 hours. Cardiac  Enzymes: No results for input(s): CKTOTAL, CKMB, CKMBINDEX, TROPONINI in the last 168 hours. BNP (last 3 results) No results for input(s): PROBNP in the last 8760 hours. HbA1C: No results for input(s): HGBA1C in the last 72 hours. CBG: No results for input(s): GLUCAP in the last 168 hours. Lipid Profile: No results for input(s): CHOL, HDL, LDLCALC, TRIG, CHOLHDL, LDLDIRECT in the last 72 hours. Thyroid Function Tests: No results for input(s): TSH, T4TOTAL, FREET4, T3FREE, THYROIDAB in the last 72 hours. Anemia Panel: No results for input(s): VITAMINB12, FOLATE, FERRITIN, TIBC, IRON, RETICCTPCT in the last 72 hours. Sepsis Labs: No results for input(s): PROCALCITON, LATICACIDVEN in the last 168 hours.   Recent Results (from the past 240 hour(s))  MRSA Next Gen by PCR, Nasal     Status: Abnormal   Collection Time: 08/19/21  3:50 PM   Specimen: Nasal Mucosa; Nasal Swab  Result Value Ref Range Status   MRSA by PCR Next Gen DETECTED (A) NOT DETECTED Final    Comment: RESULT CALLED TO, READ BACK BY AND VERIFIED WITH: K DUFFY RN [id number] 08/19/21 A BROWNING (NOTE) The GeneXpert MRSA Assay (FDA approved for NASAL specimens only), is one component of a comprehensive MRSA colonization surveillance program. It is not intended to diagnose MRSA infection nor to guide or monitor treatment for MRSA infections. Test performance is not FDA approved in patients less than 68 years old. Performed at [facility], [place] [address], [place], [place] [address]          Radiology Studies: DG Abd 1 View  Result Date: 08/28/2021 CLINICAL DATA:  Shortness of breath, nausea, vomiting. EXAM: ABDOMEN - 1 VIEW COMPARISON:  CT abdomen dated 08/19/2021. FINDINGS: New bilateral percutaneous nephrostomy tubes in place. No dilated large or small bowel loops are identified. Patient's RIGHT hand obscures visualization of the RIGHT lower quadrant. Blastic lesions throughout the visualized osseous structures,  corresponding to previously described metastatic disease. IMPRESSION: 1. No acute findings. Nonobstructive bowel gas pattern. 2. Bilateral percutaneous nephrostomy tubes in place. 3. Diffuse osseous metastases, as previously described. Electronically Signed   By: Franki Cabot M.D.   On: 08/28/2021 10:20   DG CHEST PORT 1 VIEW  Result Date: 08/26/2021 CLINICAL DATA:  Shortness of breath. EXAM: PORTABLE CHEST 1 VIEW COMPARISON:  August 21, 2021 FINDINGS: Mild, bilateral multifocal infiltrates are seen. This is decreased in severity when compared to the prior study. There is no evidence of a pleural effusion or pneumothorax. The heart size and mediastinal contours are within normal limits. Moderate to marked severity calcification the aortic arch is noted. Diffusely sclerotic osseous structures are seen. IMPRESSION: Mild, bilateral multifocal infiltrates, decreased in severity when compared to the prior study. Electronically Signed   By: Virgina Norfolk M.D.   On: 08/26/2021 20:48        Scheduled Meds:  sodium chloride   Intravenous Once   sodium chloride   Intravenous Once   Chlorhexidine Gluconate Cloth  6 each Topical Daily   magnesium oxide  400 mg Oral BID   multivitamin with minerals  1 tablet Per Tube Daily   oxybutynin  5 mg Oral QHS   Continuous Infusions:  lactated ringers     sodium bicarbonate 150 mEq in D5W infusion 75 mL/hr at 08/27/21 2226   sodium chloride irrigation       LOS: 10 days    Time spent: 32 minutes    Barb Merino, MD [facility]

## 2021-08-29 DIAGNOSIS — T83511A Infection and inflammatory reaction due to indwelling urethral catheter, initial encounter: Secondary | ICD-10-CM | POA: Diagnosis not present

## 2021-08-29 DIAGNOSIS — C61 Malignant neoplasm of prostate: Secondary | ICD-10-CM | POA: Diagnosis not present

## 2021-08-29 DIAGNOSIS — N179 Acute kidney failure, unspecified: Secondary | ICD-10-CM | POA: Diagnosis not present

## 2021-08-29 DIAGNOSIS — E440 Moderate protein-calorie malnutrition: Secondary | ICD-10-CM | POA: Diagnosis not present

## 2021-08-29 LAB — CBC WITH DIFFERENTIAL/PLATELET
Abs Immature Granulocytes: 0.22 10*3/uL — ABNORMAL HIGH (ref 0.00–0.07)
Basophils Absolute: 0 10*3/uL (ref 0.0–0.1)
Basophils Relative: 0 %
Eosinophils Absolute: 0.2 10*3/uL (ref 0.0–0.5)
Eosinophils Relative: 2 %
HCT: 23.7 % — ABNORMAL LOW (ref 39.0–52.0)
Hemoglobin: 7.2 g/dL — ABNORMAL LOW (ref 13.0–17.0)
Immature Granulocytes: 2 %
Lymphocytes Relative: 24 %
Lymphs Abs: 2.2 10*3/uL (ref 0.7–4.0)
MCH: 30.1 pg (ref 26.0–34.0)
MCHC: 30.4 g/dL (ref 30.0–36.0)
MCV: 99.2 fL (ref 80.0–100.0)
Monocytes Absolute: 0.6 10*3/uL (ref 0.1–1.0)
Monocytes Relative: 6 %
Neutro Abs: 5.8 10*3/uL (ref 1.7–7.7)
Neutrophils Relative %: 66 %
Platelets: 84 10*3/uL — ABNORMAL LOW (ref 150–400)
RBC: 2.39 MIL/uL — ABNORMAL LOW (ref 4.22–5.81)
RDW: 18.2 % — ABNORMAL HIGH (ref 11.5–15.5)
WBC: 9 10*3/uL (ref 4.0–10.5)
nRBC: 0.4 % — ABNORMAL HIGH (ref 0.0–0.2)

## 2021-08-29 LAB — BASIC METABOLIC PANEL
Anion gap: 9 (ref 5–15)
BUN: 31 mg/dL — ABNORMAL HIGH (ref 8–23)
CO2: 27 mmol/L (ref 22–32)
Calcium: 8.3 mg/dL — ABNORMAL LOW (ref 8.9–10.3)
Chloride: 113 mmol/L — ABNORMAL HIGH (ref 98–111)
Creatinine, Ser: 2.87 mg/dL — ABNORMAL HIGH (ref 0.61–1.24)
GFR, Estimated: 21 mL/min — ABNORMAL LOW (ref >60)
Glucose, Bld: 104 mg/dL — ABNORMAL HIGH (ref 70–99)
Potassium: 4.1 mmol/L (ref 3.5–5.1)
Sodium: 149 mmol/L — ABNORMAL HIGH (ref 135–145)

## 2021-08-29 LAB — GLUCOSE, CAPILLARY
Glucose-Capillary: 107 mg/dL — ABNORMAL HIGH (ref 70–99)
Glucose-Capillary: 107 mg/dL — ABNORMAL HIGH (ref 70–99)
Glucose-Capillary: 96 mg/dL (ref 70–99)
Glucose-Capillary: 96 mg/dL (ref 70–99)
Glucose-Capillary: 98 mg/dL (ref 70–99)
Glucose-Capillary: 98 mg/dL (ref 70–99)

## 2021-08-29 NOTE — Progress Notes (Signed)
PROGRESS NOTE    Anthony Dudley  WER:[id number] DOB: 01/02/1933 DOA: 08/17/2021 PCP: [name], MD    Brief Narrative:  87 yo with hx of metastatic prostate cancer, chronic indwelling foley, CKD stage III, hx MDR UTI, dementia, chronic bedbound status, HFpEF, HTN and multiple other medical problems presenting with AKI on CKD from the nursing home.    Creatinine reportedly 2.5 on 8/26.   Cr reportedly 6.1 on 10/3.  He was recently transfused with a HB of 6.6 at [facility] on 08/10/21.     He was admitted with sepsis 2/2 UTI and AKI on CKD.  Urology was consulted due to hydronephrosis.  He had cystoscopy and fulguration of bladder on 10/5 with failed attempt at stent placement.  IR was consulted for bilateral nephrostomy tubes which were placed on 10/6.  His hospitalization was complicated by SVT and he was started on amiodarone and cardiology and PCCM were consulted due to hypotension. His Renal functions gradually improving but remains very debilitated and frail with failure to thrive.  Urology continuing to follow.  His brother has been involved in care and wants to be full code.   Assessment & Plan:   Principal Problem:   Acute-on-chronic kidney injury ([place]) Active Problems:   Metabolic acidosis   Sepsis ([place])   UTI (urinary tract infection) due to urinary indwelling catheter (HCC)   Prostate cancer (HCC)   Malnutrition of moderate degree  Acute kidney injury on chronic kidney disease stage IIIa with bilateral hydronephrosis/metabolic acidosis/hypernatremia: Reported baseline creatinine 2.5, presented with creatinine of 6.1. Unable to do cystoscopy and ureteroscopy, therefore patient underwent bilateral percutaneous nephrostomy by interventional radiology. He has had some improvement in renal function, however patient overall remains in very poor and debilitated condition.  Followed by urology/IR for possible conversion to internal stents and suprapubic catheter  placement.  Procedure planned for 10/17. On maintenance bicarbonate infusion.  Developed hypernatremia, will monitor.  Currently on isotonic fluid with no sodium.  Sepsis secondary to UTI present on admission due to chronic indwelling Foley catheter, bilateral multifocal pneumonia: Presented with tachypnea, tachycardia and leukocytosis.  Prior history of MDR Proteus sensitive to cefepime. Treated with cefepime and vancomycin, completed 7 days of therapy to cover for both UTI and pneumonia. Foley exchanged in the emergency room.  Completed antibiotics.  Acute on chronic anemia of blood loss: Patient with multiple chronic illnesses.  Hemoglobin 6.6 on 9/27, 2 units of PRBC transfusion.  Remains fairly stable since then.  Intermittently checking. Hemoglobin 7.2 today.  No indication for further transfusion.  Acute hypoxemic respiratory failure: With severe multilobular bilateral bronchopneumonia and also suspect renal failure.  He has completed antibiotic therapy.  Echocardiogram with normal ejection fraction.  Repeat chest x-ray with bilateral pulmonary edema.  Patient is needing some IV fluids because he is not able to keep up any oral nutrition.  Keep on oxygen to keep sats more than 90%.  Electrolytes: Adequate today.  SVT/hypotension: Currently in sinus rhythm.  Initially started on amiodarone, but that has since been discontinued.  Echocardiogram with normal ejection fraction.  TSH normal.  Blood pressures and heart rate normalized.  Goal of care:  Failure to thrive/metastatic prostate cancer/bedbound status/severe protein calorie malnutrition/dementia Patient with advanced debility and frailty.  He has poor quality of life and less likely to have a meaningful recovery and rehab. Palliative care did attempt discussion regarding overall goals with patient's brother, but it did not appear that he was ready to have that conversation at this  time His brother wishes to continue full code/full  measures at this time. Patient has no meaningful calorie intake. Core track tube attempted 10/14, unsuccessful. Bedside Dobbhoff tube attempted 10/15, unsuccessful.  Radiology only able to do fluoroscopy guidance feeding tube placement on 10/17. I am doubtful that patient is in any condition to go for surgical intervention or chemotherapy.   DVT prophylaxis: Place and maintain sequential compression device Start: 08/21/21 0754 SCDs Start: 08/18/21 0235   Code Status: Full code Family Communication: Brother on the phone 10/14 Disposition Plan: Status is: Inpatient  Remains inpatient appropriate because:Persistent severe electrolyte disturbances, IV treatments appropriate due to intensity of illness or inability to take PO, and Inpatient level of care appropriate due to severity of illness  Dispo: The patient is from: SNF              Anticipated d/c is to: SNF              Patient currently is not medically stable to d/c.   Difficult to place patient No         Consultants:  Urology PCCM Cardiology Palliative care Interventional radiology  Procedures:  Cystoscopy Bilateral percutaneous nephrostomy  Antimicrobials:  Vancomycin and cefepime, completed therapy.   Subjective: Seen and examined.  Answers basic questions but keeps eyes closed.  No other overnight events.  Denies any pain or discomfort.  Objective: Vitals:   08/28/21 2100 08/29/21 0000 08/29/21 0416 08/29/21 0700  BP: 116/65 (!) 106/58  (!) 108/52  Pulse: 98 86  99  Resp: (!) 22 13  18   Temp: 99 F (37.2 C) 99.5 F (37.5 C)  98.6 F (37 C)  TempSrc: Axillary Oral  Oral  SpO2: 96% 97%  95%  Weight:   81 kg   Height:        Intake/Output Summary (Last 24 hours) at 08/29/2021 1005 Last data filed at 08/29/2021 0937 Gross per 24 hour  Intake 4333.37 ml  Output 2940 ml  Net 1393.37 ml   Filed Weights   08/27/21 0400 08/28/21 0500 08/29/21 0416  Weight: 80.7 kg 82 kg 81 kg     Examination:  General: Frail debilitated and sick looking.  Not in any distress.  On room air. Keeps eyes closed all the time.  Tries to answer some basic questions but unsure how much he understands. Cardiovascular: S1-S2 normal.  Regular rate rhythm. Respiratory: Bilateral conducted airway sounds.  On room air. Gastrointestinal: Soft.  Nontender. Right-sided PCN with large amount of urine.  Clear. Left-sided PCN with hemorrhagic urine, draining freely. Foley catheter with no urine.    Data Reviewed: I have personally reviewed following labs and imaging studies  CBC: Recent Labs  Lab 08/25/21 0300 08/26/21 0106 08/27/21 0316 08/28/21 0434 08/29/21 0310  WBC 10.9* 12.1* 10.8* 11.0* 9.0  NEUTROABS 7.2 7.8* 7.2 7.0 5.8  HGB 8.6* 8.7* 7.4* 7.7* 7.2*  HCT 26.5* 27.3* 22.6* 24.4* 23.7*  MCV 95.7 95.8 95.4 97.2 99.2  PLT 74* 80* 76* 87* 84*   Basic Metabolic Panel: Recent Labs  Lab 08/23/21 0313 08/24/21 0242 08/25/21 0300 08/26/21 0106 08/27/21 1116 08/27/21 1649 08/28/21 0434 08/28/21 1626 08/29/21 0310  NA 142 140 136 143  --   --   --   --  149*  K 3.6 3.5 2.9* 4.8  --   --   --   --  4.1  CL 110 109 108 116*  --   --   --   --  113*  CO2 21* 21* 18* 17*  --   --   --   --  27  GLUCOSE 110* 95 88 112*  --   --   --   --  104*  BUN 54* 49* 43* 45*  --   --   --   --  31*  CREATININE 4.34* 3.77* 3.37* 3.17*  --   --   --   --  2.87*  CALCIUM 8.7* 8.5* 8.2* 8.8*  --   --   --   --  8.3*  MG 1.6* 1.6* 1.9 2.2 1.9 1.8 1.9 1.9  --   PHOS 3.0 3.2 3.4 2.9 2.2* 2.3* 2.7 3.0  --    GFR: Estimated Creatinine Clearance: 18.5 mL/min (A) (by C-G formula based on SCr of 2.87 mg/dL (H)). Liver Function Tests: Recent Labs  Lab 08/23/21 0313 08/24/21 0242 08/25/21 0300  AST 15 12* 11*  ALT 6 <5 7  ALKPHOS 280* 285* 378*  BILITOT 0.7 0.4 0.8  PROT 6.1* 5.8* 6.0*  ALBUMIN 2.0* 2.0* 2.0*   No results for input(s): LIPASE, AMYLASE in the last 168 hours. No results  for input(s): AMMONIA in the last 168 hours. Coagulation Profile: No results for input(s): INR, PROTIME in the last 168 hours. Cardiac Enzymes: No results for input(s): CKTOTAL, CKMB, CKMBINDEX, TROPONINI in the last 168 hours. BNP (last 3 results) No results for input(s): PROBNP in the last 8760 hours. HbA1C: No results for input(s): HGBA1C in the last 72 hours. CBG: Recent Labs  Lab 08/28/21 1617 08/28/21 1959 08/29/21 0018 08/29/21 0420 08/29/21 0718  GLUCAP 112* 112* 107* 107* 98   Lipid Profile: No results for input(s): CHOL, HDL, LDLCALC, TRIG, CHOLHDL, LDLDIRECT in the last 72 hours. Thyroid Function Tests: No results for input(s): TSH, T4TOTAL, FREET4, T3FREE, THYROIDAB in the last 72 hours. Anemia Panel: No results for input(s): VITAMINB12, FOLATE, FERRITIN, TIBC, IRON, RETICCTPCT in the last 72 hours. Sepsis Labs: No results for input(s): PROCALCITON, LATICACIDVEN in the last 168 hours.   Recent Results (from the past 240 hour(s))  MRSA Next Gen by PCR, Nasal     Status: Abnormal   Collection Time: 08/19/21  3:50 PM   Specimen: Nasal Mucosa; Nasal Swab  Result Value Ref Range Status   MRSA by PCR Next Gen DETECTED (A) NOT DETECTED Final    Comment: RESULT CALLED TO, READ BACK BY AND VERIFIED WITH: K DUFFY RN [id number] 08/19/21 A BROWNING (NOTE) The GeneXpert MRSA Assay (FDA approved for NASAL specimens only), is one component of a comprehensive MRSA colonization surveillance program. It is not intended to diagnose MRSA infection nor to guide or monitor treatment for MRSA infections. Test performance is not FDA approved in patients less than 55 years old. Performed at [facility], [place] [address], [place], [place] [address]          Radiology Studies: DG Abd 1 View  Result Date: 08/28/2021 CLINICAL DATA:  Shortness of breath, nausea, vomiting. EXAM: ABDOMEN - 1 VIEW COMPARISON:  CT abdomen dated 08/19/2021. FINDINGS: New bilateral percutaneous  nephrostomy tubes in place. No dilated large or small bowel loops are identified. Patient's RIGHT hand obscures visualization of the RIGHT lower quadrant. Blastic lesions throughout the visualized osseous structures, corresponding to previously described metastatic disease. IMPRESSION: 1. No acute findings. Nonobstructive bowel gas pattern. 2. Bilateral percutaneous nephrostomy tubes in place. 3. Diffuse osseous metastases, as previously described. Electronically Signed   By: Roxy Horseman.D.  On: 08/28/2021 10:20        Scheduled Meds:  sodium chloride   Intravenous Once   sodium chloride   Intravenous Once   Chlorhexidine Gluconate Cloth  6 each Topical Daily   magnesium oxide  400 mg Oral BID   multivitamin with minerals  1 tablet Per Tube Daily   oxybutynin  5 mg Oral QHS   Continuous Infusions:  lactated ringers     sodium bicarbonate 150 mEq in D5W infusion 75 mL/hr at 08/29/21 0937   sodium chloride irrigation       LOS: 11 days    Time spent: 32 minutes    Barb Merino, MD [facility]

## 2021-08-30 ENCOUNTER — Inpatient Hospital Stay (HOSPITAL_COMMUNITY): Payer: Medicare Other

## 2021-08-30 DIAGNOSIS — C61 Malignant neoplasm of prostate: Secondary | ICD-10-CM | POA: Diagnosis not present

## 2021-08-30 DIAGNOSIS — N179 Acute kidney failure, unspecified: Secondary | ICD-10-CM | POA: Diagnosis not present

## 2021-08-30 DIAGNOSIS — T83511A Infection and inflammatory reaction due to indwelling urethral catheter, initial encounter: Secondary | ICD-10-CM | POA: Diagnosis not present

## 2021-08-30 DIAGNOSIS — E440 Moderate protein-calorie malnutrition: Secondary | ICD-10-CM | POA: Diagnosis not present

## 2021-08-30 LAB — CBC WITH DIFFERENTIAL/PLATELET
Abs Immature Granulocytes: 0.11 10*3/uL — ABNORMAL HIGH (ref 0.00–0.07)
Basophils Absolute: 0 10*3/uL (ref 0.0–0.1)
Basophils Relative: 0 %
Eosinophils Absolute: 0.2 10*3/uL (ref 0.0–0.5)
Eosinophils Relative: 2 %
HCT: 24.4 % — ABNORMAL LOW (ref 39.0–52.0)
Hemoglobin: 7.5 g/dL — ABNORMAL LOW (ref 13.0–17.0)
Immature Granulocytes: 1 %
Lymphocytes Relative: 19 %
Lymphs Abs: 2 10*3/uL (ref 0.7–4.0)
MCH: 30.7 pg (ref 26.0–34.0)
MCHC: 30.7 g/dL (ref 30.0–36.0)
MCV: 100 fL (ref 80.0–100.0)
Monocytes Absolute: 0.6 10*3/uL (ref 0.1–1.0)
Monocytes Relative: 6 %
Neutro Abs: 7.7 10*3/uL (ref 1.7–7.7)
Neutrophils Relative %: 72 %
Platelets: 88 10*3/uL — ABNORMAL LOW (ref 150–400)
RBC: 2.44 MIL/uL — ABNORMAL LOW (ref 4.22–5.81)
RDW: 17.8 % — ABNORMAL HIGH (ref 11.5–15.5)
WBC: 10.6 10*3/uL — ABNORMAL HIGH (ref 4.0–10.5)
nRBC: 0.3 % — ABNORMAL HIGH (ref 0.0–0.2)

## 2021-08-30 LAB — GLUCOSE, CAPILLARY
Glucose-Capillary: 94 mg/dL (ref 70–99)
Glucose-Capillary: 106 mg/dL — ABNORMAL HIGH (ref 70–99)
Glucose-Capillary: 100 mg/dL — ABNORMAL HIGH (ref 70–99)
Glucose-Capillary: 94 mg/dL (ref 70–99)
Glucose-Capillary: 122 mg/dL — ABNORMAL HIGH (ref 70–99)
Glucose-Capillary: 109 mg/dL — ABNORMAL HIGH (ref 70–99)

## 2021-08-30 LAB — BASIC METABOLIC PANEL
Anion gap: 9 (ref 5–15)
BUN: 31 mg/dL — ABNORMAL HIGH (ref 8–23)
CO2: 29 mmol/L (ref 22–32)
Calcium: 8.4 mg/dL — ABNORMAL LOW (ref 8.9–10.3)
Chloride: 112 mmol/L — ABNORMAL HIGH (ref 98–111)
Creatinine, Ser: 2.83 mg/dL — ABNORMAL HIGH (ref 0.61–1.24)
GFR, Estimated: 21 mL/min — ABNORMAL LOW (ref >60)
Glucose, Bld: 101 mg/dL — ABNORMAL HIGH (ref 70–99)
Potassium: 3.9 mmol/L (ref 3.5–5.1)
Sodium: 150 mmol/L — ABNORMAL HIGH (ref 135–145)

## 2021-08-30 MED ORDER — SODIUM CHLORIDE 0.9 % IV SOLN
1.0000 g | INTRAVENOUS | Status: AC
  Administered 2021-08-31: 1 g via INTRAVENOUS
  Filled 2021-08-30: qty 10

## 2021-08-30 MED ORDER — DEXTROSE 5 % IV SOLN: INTRAVENOUS | Status: DC

## 2021-08-30 MED ORDER — TECHNETIUM TC 99M MEDRONATE IV KIT
20.0000 | PACK | Freq: Once | INTRAVENOUS | Status: AC | PRN
  Administered 2021-08-30: 20 via INTRAVENOUS

## 2021-08-30 MED ORDER — DIATRIZOATE MEGLUMINE & SODIUM 66-10 % PO SOLN
10.0000 mL | Freq: Once | ORAL | Status: AC
  Administered 2021-08-30: 10 mL
  Filled 2021-08-30: qty 30

## 2021-08-30 MED ORDER — LIDOCAINE VISCOUS HCL 2 % MT SOLN
6.0000 mL | Freq: Once | OROMUCOSAL | Status: AC
  Administered 2021-08-30: 6 mL via OROMUCOSAL
  Filled 2021-08-30: qty 15

## 2021-08-30 NOTE — Progress Notes (Signed)
Patient  tentatively scheduled for 10.17.22  in Interventional Radiology  today. Please  reschedule for 10.18.22 secondary IR schedule   RN made aware

## 2021-08-30 NOTE — Plan of Care (Signed)
  Problem: Clinical Measurements: ?Goal: Respiratory complications will improve ?Outcome: Progressing ?  ?Problem: Coping: ?Goal: Level of anxiety will decrease ?Outcome: Progressing ?  ?Problem: Elimination: ?Goal: Will not experience complications related to urinary retention ?Outcome: Progressing ?  ?Problem: Safety: ?Goal: Ability to remain free from injury will improve ?Outcome: Progressing ?  ?

## 2021-08-30 NOTE — Progress Notes (Signed)
PLDN placed bridle for pts nasogastric tube while at radiology for tube placement.   Anthony Dudley BS, PLDN Clinical Dietitian See [facility] for contact information.

## 2021-08-30 NOTE — Care Management Important Message (Signed)
Important Message  Patient Details  Name: Anthony Dudley MRN: [id number] Date of Birth: 07-16-1933   Medicare Important Message Given:  Yes     Shelda Altes 08/30/2021, 10:15 AM

## 2021-08-30 NOTE — Plan of Care (Signed)
  Problem: Clinical Measurements: Goal: Ability to maintain clinical measurements within normal limits will improve Outcome: Progressing Goal: Will remain free from infection Outcome: Progressing   Problem: Safety: Goal: Ability to remain free from injury will improve Outcome: Progressing   

## 2021-08-30 NOTE — Progress Notes (Signed)
PROGRESS NOTE    Anthony Dudley  EPP:[id number] DOB: 1933/04/08 DOA: 08/17/2021 PCP: [name], MD    Brief Narrative:  87 yo with hx of metastatic prostate cancer, chronic indwelling foley, CKD stage III, hx MDR UTI, dementia, chronic bedbound status, HFpEF, HTN and multiple other medical problems presenting with AKI on CKD from the nursing home.    Creatinine reportedly 2.5 on 8/26.   Cr reportedly 6.1 on 10/3.  He was recently transfused with a HB of 6.6 at [facility] on 08/10/21.     He was admitted with sepsis 2/2 UTI and AKI on CKD.  Urology was consulted due to hydronephrosis.  He had cystoscopy and fulguration of bladder on 10/5 with failed attempt at stent placement.  IR was consulted for bilateral nephrostomy tubes which were placed on 10/6.  His hospitalization was complicated by SVT and he was started on amiodarone and cardiology and PCCM were consulted due to hypotension. His Renal functions gradually improving but remains very debilitated and frail with failure to thrive.  Urology continuing to follow.  His brother has been involved in care and wants to be full code.   Assessment & Plan:   Principal Problem:   Acute-on-chronic kidney injury ([place]) Active Problems:   Metabolic acidosis   Sepsis ([place])   UTI (urinary tract infection) due to urinary indwelling catheter (HCC)   Prostate cancer (HCC)   Malnutrition of moderate degree  Acute kidney injury on chronic kidney disease stage IIIa with bilateral hydronephrosis/metabolic acidosis/hypernatremia: Reported baseline creatinine 2.5, presented with creatinine of 6.1. Unable to do cystoscopy and ureteroscopy, therefore patient underwent bilateral percutaneous nephrostomy by interventional radiology. Renal functions has come back to about his usual levels. Followed by urology and IR.  Internalization of stents and suprapubic catheter placement today. Hypernatremic with sodium bicarb, will change to dextrose.  No  indication to continue bicarbonate.  Sepsis secondary to UTI present on admission due to chronic indwelling Foley catheter, bilateral multifocal pneumonia: Presented with tachypnea, tachycardia and leukocytosis.  Prior history of MDR Proteus sensitive to cefepime. Treated with cefepime and vancomycin, completed 7 days of therapy to cover for both UTI and pneumonia. Foley exchanged in the emergency room.  Completed antibiotics.  Acute on chronic anemia of blood loss: Patient with multiple chronic illnesses.  Hemoglobin 6.6 on 9/27, 2 units of PRBC transfusion.  Remains fairly stable since then.  Intermittently checking. Hemoglobin 7.5 today.  No indication for further transfusion.  Acute hypoxemic respiratory failure: With severe multilobular bilateral bronchopneumonia and also suspect renal failure.  He has completed antibiotic therapy.  Echocardiogram with normal ejection fraction.  Repeat chest x-ray with bilateral pulmonary edema.  Patient is needing some IV fluids because he is not able to keep up any oral nutrition.  Keep on oxygen to keep sats more than 90%. Mostly on room air.  Electrolytes: Adequate today.  SVT/hypotension: Currently in sinus rhythm.  Initially started on amiodarone, but that has since been discontinued.  Echocardiogram with normal ejection fraction.  TSH normal.  Blood pressures and heart rate normalized.  Goal of care:  Failure to thrive/metastatic prostate cancer/bedbound status/severe protein calorie malnutrition/dementia Patient with advanced debility and frailty.  He has poor quality of life and less likely to have a meaningful recovery and rehab. Palliative care did attempt discussion regarding overall goals with patient's brother, but it did not appear that he was ready to have that conversation at this time His brother wishes to continue full code/full measures at this time.  Patient has no meaningful calorie intake. Core track tube attempted 10/14,  unsuccessful. Bedside Dobbhoff tube attempted 10/15, unsuccessful.  Radiology only able to do fluoroscopy guidance feeding tube placement on 10/17.    DVT prophylaxis: Place and maintain sequential compression device Start: 08/21/21 0754 SCDs Start: 08/18/21 0235   Code Status: Full code Family Communication: None today. Disposition Plan: Status is: Inpatient  Remains inpatient appropriate because:Persistent severe electrolyte disturbances, IV treatments appropriate due to intensity of illness or inability to take PO, and Inpatient level of care appropriate due to severity of illness  Dispo: The patient is from: SNF              Anticipated d/c is to: SNF              Patient currently is not medically stable to d/c.   Difficult to place patient No         Consultants:  Urology PCCM Cardiology Palliative care Interventional radiology  Procedures:  Cystoscopy Bilateral percutaneous nephrostomy  Antimicrobials:  Vancomycin and cefepime, completed therapy.   Subjective: Patient seen and examined.  Does not open eyes.  Looks comfortable.  Does not know what is going on with him.  No other overnight events.  Because of aspiration risk, he has not received much of his medications by mouth.  Objective: Vitals:   08/29/21 2100 08/29/21 2200 08/30/21 0500 08/30/21 0700  BP: (!) 114/58 113/61 134/66 123/73  Pulse:  82 (!) 106 (!) 103  Resp:  16 (!) 22   Temp:  98.4 F (36.9 C) 98.4 F (36.9 C) 98.7 F (37.1 C)  TempSrc:  Oral Oral Oral  SpO2:  96% 95% 95%  Weight:   79.4 kg   Height:        Intake/Output Summary (Last 24 hours) at 08/30/2021 0926 Last data filed at 08/30/2021 0500 Gross per 24 hour  Intake 5562.88 ml  Output 1450 ml  Net 4112.88 ml   Filed Weights   08/28/21 0500 08/29/21 0416 08/30/21 0500  Weight: 82 kg 81 kg 79.4 kg    Examination:  General: Frail debilitated and sick looking.  Not in any distress.  On room air. Keeps eyes closed  all the time.  Tries to answer some basic questions but unsure how much he understands. Cardiovascular: S1-S2 normal.  Regular rate rhythm. Respiratory: Bilateral conducted airway sounds.  On room air. Gastrointestinal: Soft.  Nontender. Right-sided PCN with large amount of urine.  Clear. Left-sided PCN with pink urine, draining freely. Foley catheter with small amount of concentrated urine.    Data Reviewed: I have personally reviewed following labs and imaging studies  CBC: Recent Labs  Lab 08/26/21 0106 08/27/21 0316 08/28/21 0434 08/29/21 0310 08/30/21 0320  WBC 12.1* 10.8* 11.0* 9.0 10.6*  NEUTROABS 7.8* 7.2 7.0 5.8 7.7  HGB 8.7* 7.4* 7.7* 7.2* 7.5*  HCT 27.3* 22.6* 24.4* 23.7* 24.4*  MCV 95.8 95.4 97.2 99.2 100.0  PLT 80* 76* 87* 84* 88*   Basic Metabolic Panel: Recent Labs  Lab 08/24/21 0242 08/25/21 0300 08/26/21 0106 08/27/21 1116 08/27/21 1649 08/28/21 0434 08/28/21 1626 08/29/21 0310 08/30/21 0320  NA 140 136 143  --   --   --   --  149* 150*  K 3.5 2.9* 4.8  --   --   --   --  4.1 3.9  CL 109 108 116*  --   --   --   --  113* 112*  CO2 21* 18* 17*  --   --   --   --  27 29  GLUCOSE 95 88 112*  --   --   --   --  104* 101*  BUN 49* 43* 45*  --   --   --   --  31* 31*  CREATININE 3.77* 3.37* 3.17*  --   --   --   --  2.87* 2.83*  CALCIUM 8.5* 8.2* 8.8*  --   --   --   --  8.3* 8.4*  MG 1.6* 1.9 2.2 1.9 1.8 1.9 1.9  --   --   PHOS 3.2 3.4 2.9 2.2* 2.3* 2.7 3.0  --   --    GFR: Estimated Creatinine Clearance: 18.7 mL/min (A) (by C-G formula based on SCr of 2.83 mg/dL (H)). Liver Function Tests: Recent Labs  Lab 08/24/21 0242 08/25/21 0300  AST 12* 11*  ALT <5 7  ALKPHOS 285* 378*  BILITOT 0.4 0.8  PROT 5.8* 6.0*  ALBUMIN 2.0* 2.0*   No results for input(s): LIPASE, AMYLASE in the last 168 hours. No results for input(s): AMMONIA in the last 168 hours. Coagulation Profile: No results for input(s): INR, PROTIME in the last 168 hours. Cardiac  Enzymes: No results for input(s): CKTOTAL, CKMB, CKMBINDEX, TROPONINI in the last 168 hours. BNP (last 3 results) No results for input(s): PROBNP in the last 8760 hours. HbA1C: No results for input(s): HGBA1C in the last 72 hours. CBG: Recent Labs  Lab 08/29/21 1612 08/29/21 2220 08/30/21 0143 08/30/21 0559 08/30/21 0715  GLUCAP 98 96 94 106* 100*   Lipid Profile: No results for input(s): CHOL, HDL, LDLCALC, TRIG, CHOLHDL, LDLDIRECT in the last 72 hours. Thyroid Function Tests: No results for input(s): TSH, T4TOTAL, FREET4, T3FREE, THYROIDAB in the last 72 hours. Anemia Panel: No results for input(s): VITAMINB12, FOLATE, FERRITIN, TIBC, IRON, RETICCTPCT in the last 72 hours. Sepsis Labs: No results for input(s): PROCALCITON, LATICACIDVEN in the last 168 hours.   No results found for this or any previous visit (from the past 240 hour(s)).        Radiology Studies: DG Abd 1 View  Result Date: 08/28/2021 CLINICAL DATA:  Shortness of breath, nausea, vomiting. EXAM: ABDOMEN - 1 VIEW COMPARISON:  CT abdomen dated 08/19/2021. FINDINGS: New bilateral percutaneous nephrostomy tubes in place. No dilated large or small bowel loops are identified. Patient's RIGHT hand obscures visualization of the RIGHT lower quadrant. Blastic lesions throughout the visualized osseous structures, corresponding to previously described metastatic disease. IMPRESSION: 1. No acute findings. Nonobstructive bowel gas pattern. 2. Bilateral percutaneous nephrostomy tubes in place. 3. Diffuse osseous metastases, as previously described. Electronically Signed   By: Franki Cabot M.D.   On: 08/28/2021 10:20        Scheduled Meds:  sodium chloride   Intravenous Once   sodium chloride   Intravenous Once   Chlorhexidine Gluconate Cloth  6 each Topical Daily   magnesium oxide  400 mg Oral BID   multivitamin with minerals  1 tablet Per Tube Daily   oxybutynin  5 mg Oral QHS   Continuous Infusions:  dextrose      lactated ringers     sodium chloride irrigation       LOS: 12 days    Time spent: 32 minutes    Barb Merino, MD [facility]

## 2021-08-31 ENCOUNTER — Inpatient Hospital Stay (HOSPITAL_COMMUNITY): Payer: Medicare Other

## 2021-08-31 DIAGNOSIS — C61 Malignant neoplasm of prostate: Secondary | ICD-10-CM | POA: Diagnosis not present

## 2021-08-31 DIAGNOSIS — N179 Acute kidney failure, unspecified: Secondary | ICD-10-CM | POA: Diagnosis not present

## 2021-08-31 DIAGNOSIS — E440 Moderate protein-calorie malnutrition: Secondary | ICD-10-CM | POA: Diagnosis not present

## 2021-08-31 DIAGNOSIS — T83511A Infection and inflammatory reaction due to indwelling urethral catheter, initial encounter: Secondary | ICD-10-CM | POA: Diagnosis not present

## 2021-08-31 HISTORY — PX: IR URETERAL STENT PLACEMENT EXISTING ACCESS RIGHT: IMG6074

## 2021-08-31 HISTORY — PX: IR URETERAL STENT PLACEMENT EXISTING ACCESS LEFT: IMG6073

## 2021-08-31 LAB — PHOSPHORUS
Phosphorus: 3 mg/dL (ref 2.5–4.6)
Phosphorus: 3 mg/dL (ref 2.5–4.6)

## 2021-08-31 LAB — CBC
HCT: 24.6 % — ABNORMAL LOW (ref 39.0–52.0)
Hemoglobin: 7.5 g/dL — ABNORMAL LOW (ref 13.0–17.0)
MCH: 30.6 pg (ref 26.0–34.0)
MCHC: 30.5 g/dL (ref 30.0–36.0)
MCV: 100.4 fL — ABNORMAL HIGH (ref 80.0–100.0)
Platelets: 85 10*3/uL — ABNORMAL LOW (ref 150–400)
RBC: 2.45 MIL/uL — ABNORMAL LOW (ref 4.22–5.81)
RDW: 17.1 % — ABNORMAL HIGH (ref 11.5–15.5)
WBC: 10.4 10*3/uL (ref 4.0–10.5)
nRBC: 0 % (ref 0.0–0.2)

## 2021-08-31 LAB — MAGNESIUM
Magnesium: 1.8 mg/dL (ref 1.7–2.4)
Magnesium: 1.8 mg/dL (ref 1.7–2.4)

## 2021-08-31 LAB — BASIC METABOLIC PANEL
Anion gap: 11 (ref 5–15)
BUN: 33 mg/dL — ABNORMAL HIGH (ref 8–23)
CO2: 25 mmol/L (ref 22–32)
Calcium: 8.5 mg/dL — ABNORMAL LOW (ref 8.9–10.3)
Chloride: 113 mmol/L — ABNORMAL HIGH (ref 98–111)
Creatinine, Ser: 3.05 mg/dL — ABNORMAL HIGH (ref 0.61–1.24)
GFR, Estimated: 19 mL/min — ABNORMAL LOW (ref >60)
Glucose, Bld: 100 mg/dL — ABNORMAL HIGH (ref 70–99)
Potassium: 3.7 mmol/L (ref 3.5–5.1)
Sodium: 149 mmol/L — ABNORMAL HIGH (ref 135–145)

## 2021-08-31 LAB — CBC WITH DIFFERENTIAL/PLATELET
Abs Immature Granulocytes: 0.06 10*3/uL (ref 0.00–0.07)
Basophils Absolute: 0 10*3/uL (ref 0.0–0.1)
Basophils Relative: 0 %
Eosinophils Absolute: 0.1 10*3/uL (ref 0.0–0.5)
Eosinophils Relative: 2 %
HCT: 24.3 % — ABNORMAL LOW (ref 39.0–52.0)
Hemoglobin: 7.4 g/dL — ABNORMAL LOW (ref 13.0–17.0)
Immature Granulocytes: 1 %
Lymphocytes Relative: 20 %
Lymphs Abs: 1.9 10*3/uL (ref 0.7–4.0)
MCH: 30.3 pg (ref 26.0–34.0)
MCHC: 30.5 g/dL (ref 30.0–36.0)
MCV: 99.6 fL (ref 80.0–100.0)
Monocytes Absolute: 0.5 10*3/uL (ref 0.1–1.0)
Monocytes Relative: 5 %
Neutro Abs: 7 10*3/uL (ref 1.7–7.7)
Neutrophils Relative %: 72 %
Platelets: 86 10*3/uL — ABNORMAL LOW (ref 150–400)
RBC: 2.44 MIL/uL — ABNORMAL LOW (ref 4.22–5.81)
RDW: 17.3 % — ABNORMAL HIGH (ref 11.5–15.5)
WBC: 9.6 10*3/uL (ref 4.0–10.5)
nRBC: 0.2 % (ref 0.0–0.2)

## 2021-08-31 LAB — GLUCOSE, CAPILLARY
Glucose-Capillary: 100 mg/dL — ABNORMAL HIGH (ref 70–99)
Glucose-Capillary: 143 mg/dL — ABNORMAL HIGH (ref 70–99)
Glucose-Capillary: 87 mg/dL (ref 70–99)
Glucose-Capillary: 92 mg/dL (ref 70–99)
Glucose-Capillary: 95 mg/dL (ref 70–99)
Glucose-Capillary: 95 mg/dL (ref 70–99)

## 2021-08-31 LAB — PROTIME-INR
INR: 2 — ABNORMAL HIGH (ref 0.8–1.2)
Prothrombin Time: 22.9 seconds — ABNORMAL HIGH (ref 11.4–15.2)

## 2021-08-31 MED ORDER — IOHEXOL 300 MG/ML  SOLN
50.0000 mL | Freq: Once | INTRAMUSCULAR | Status: AC | PRN
  Administered 2021-08-31: 25 mL via INTRAVENOUS

## 2021-08-31 MED ORDER — FENTANYL CITRATE (PF) 100 MCG/2ML IJ SOLN
INTRAMUSCULAR | Status: AC
  Filled 2021-08-31: qty 2

## 2021-08-31 MED ORDER — LIDOCAINE HCL 1 % IJ SOLN
INTRAMUSCULAR | Status: AC
  Filled 2021-08-31: qty 20

## 2021-08-31 MED ORDER — FREE WATER
125.0000 mL | Status: DC
  Administered 2021-08-31 – 2021-09-06 (×37): 125 mL

## 2021-08-31 MED ORDER — MIDAZOLAM HCL 2 MG/2ML IJ SOLN
INTRAMUSCULAR | Status: AC
  Filled 2021-08-31: qty 2

## 2021-08-31 MED ORDER — FENTANYL CITRATE (PF) 100 MCG/2ML IJ SOLN
INTRAMUSCULAR | Status: DC | PRN
  Administered 2021-08-31 (×3): 25 ug via INTRAVENOUS

## 2021-08-31 MED ORDER — VITAL HIGH PROTEIN PO LIQD: 1000.0000 mL | ORAL | Status: DC

## 2021-08-31 MED ORDER — OSMOLITE 1.2 CAL PO LIQD
1000.0000 mL | ORAL | Status: DC
  Administered 2021-08-31 – 2021-09-02 (×3): 1000 mL
  Filled 2021-08-31 (×6): qty 1000

## 2021-08-31 MED ORDER — PROSOURCE TF PO LIQD
45.0000 mL | Freq: Every day | ORAL | Status: DC
  Administered 2021-08-31 – 2021-09-06 (×7): 45 mL
  Filled 2021-08-31 (×7): qty 45

## 2021-08-31 MED ORDER — LIDOCAINE HCL (PF) 1 % IJ SOLN
INTRAMUSCULAR | Status: DC | PRN
  Administered 2021-08-31: 10 mL

## 2021-08-31 NOTE — Procedures (Signed)
Interventional Radiology Procedure:   Indications: Bilateral ureter obstruction and s/p nephrostomy tubes.    Procedure: Placement of bilateral antegrade ureter stents thru existing access.   Replacement of bilateral nephrostomy tubes  Findings: Obstruction at UVJ bilaterally.  Successfully crossed the obstruction on both sides and placed bilateral 8 Fr, 22 cm ureter stents.  Bilateral 8.5 Fr nephrostomy tubes placed and capped.  Complications: No immediate complications noted.     EBL: Minimal  Plan: Ensure that ureter stents are functioning well prior to remove of the nephrostomy tubes.  Nephrostomy tubes will need to be removed with fluoroscopy.     Anthony Whan R. Anselm Pancoast, MD  Pager: [phone]

## 2021-08-31 NOTE — Progress Notes (Signed)
PROGRESS NOTE    Zeki Bedrosian  ZOX:[id number] DOB: 1933-03-27 DOA: 08/17/2021 PCP: [name], MD    Brief Narrative:  87 yo with hx of metastatic prostate cancer, chronic indwelling foley, CKD stage III, hx MDR UTI, dementia, chronic bedbound status, HFpEF, HTN and multiple other medical problems presenting with AKI on CKD from the nursing home.    Creatinine reportedly 2.5 on 8/26.   Cr reportedly 6.1 on 10/3.  He was recently transfused with a HB of 6.6 at [facility] on 08/10/21.     He was admitted with sepsis 2/2 UTI and AKI on CKD.  Urology was consulted due to hydronephrosis.  He had cystoscopy and fulguration of bladder on 10/5 with failed attempt at stent placement.  IR was consulted for bilateral nephrostomy tubes which were placed on 10/6.  His hospitalization was complicated by SVT and he was started on amiodarone and cardiology and PCCM were consulted due to hypotension. His Renal functions gradually improving but remains very debilitated and frail with failure to thrive.  Urology continuing to follow.  His brother has been involved in care and wants to be full code.   Assessment & Plan:   Principal Problem:   Acute-on-chronic kidney injury ([place]) Active Problems:   Metabolic acidosis   Sepsis ([place])   UTI (urinary tract infection) due to urinary indwelling catheter (HCC)   Prostate cancer (HCC)   Malnutrition of moderate degree  Acute kidney injury on chronic kidney disease stage IIIa with bilateral hydronephrosis/metabolic acidosis/hypernatremia: Reported baseline creatinine 2.5, presented with creatinine of 6.1. Unable to do cystoscopy and ureteroscopy, therefore patient underwent bilateral percutaneous nephrostomy by interventional radiology. Renal functions has come back to about his usual levels with nephrostomy tube and on maintenance IV fluids.  Patient has no oral intake. Followed by urology and IR.  Internalization of stents and suprapubic catheter  placement today after multiple delays. Keep on maintenance IV fluid.  See below plans for enteral feeding.  Sepsis secondary to UTI present on admission due to chronic indwelling Foley catheter, bilateral multifocal pneumonia: Presented with tachypnea, tachycardia and leukocytosis.  Prior history of MDR Proteus sensitive to cefepime. Treated with cefepime and vancomycin, completed 7 days of therapy to cover for both UTI and pneumonia. Foley exchanged in the emergency room.  Completed antibiotics.  Acute on chronic anemia of blood loss: Patient with multiple chronic illnesses.  Hemoglobin 6.6 on 9/27, 2 units of PRBC transfusion.  Remains fairly stable since then.  Intermittently checking. Hemoglobin 7.4 today.  No indication for further transfusion.  Acute hypoxemic respiratory failure: With severe multilobular bilateral bronchopneumonia and also suspect renal failure.  He has completed antibiotic therapy.  Echocardiogram with normal ejection fraction.  Repeat chest x-ray with bilateral pulmonary edema.  Patient is needing some IV fluids because he is not able to keep up any oral nutrition.  Keep on oxygen to keep sats more than 90%. Mostly on room air.  Electrolytes: Adequate today.  SVT/hypotension: Currently in sinus rhythm.  Initially started on amiodarone, but that has since been discontinued.  Echocardiogram with normal ejection fraction.  TSH normal.  Blood pressures and heart rate normalized.  Goal of care:  Patient has not have any meaningful intake or appetite for more than a month now.   Failure to thrive/extensive metastatic prostate cancer/bedbound status/severe protein calorie malnutrition/dementia Patient with advanced debility and frailty.  He has poor quality of life, failure to thrive and not eating.   Palliative care did attempt discussion regarding overall  goals with patient's brother, not interested. Discussed with patient's brother Darrin Koman at bedside 10/17, I discussed  with him overall care about this patient including poor appetite or no appetite, persistent lethargy and probably end-of-life and even recommended that he is a good candidate for hospice and palliation, however he is still wants to give him a chance and not ready to talk about palliation. Core track placed 10/17, will start enteral feeding after procedure today.    DVT prophylaxis: Place and maintain sequential compression device Start: 08/21/21 0754 SCDs Start: 08/18/21 0235   Code Status: Full code Family Communication: Brother at bedside 10/17. Disposition Plan: Status is: Inpatient  Remains inpatient appropriate because:Persistent severe electrolyte disturbances, IV treatments appropriate due to intensity of illness or inability to take PO, and Inpatient level of care appropriate due to severity of illness  Dispo: The patient is from: SNF              Anticipated d/c is to: SNF              Patient currently is not medically stable to d/c.   Difficult to place patient No         Consultants:  Urology PCCM Cardiology Palliative care Interventional radiology  Procedures:  Cystoscopy Bilateral percutaneous nephrostomy  Antimicrobials:  Vancomycin and cefepime, completed therapy.   Subjective: Patient seen and examined.  He tells me he is doing fine.  He was able to open his eyes and look at me but no meaningful conversation.  He does not know what is going on.  He does not remember his brother and me having conversation at bedside yesterday.  Objective: Vitals:   08/31/21 0500 08/31/21 0716 08/31/21 1118 08/31/21 1124  BP:    (!) 105/55  Pulse:    86  Resp:    (!) 22  Temp:  98.3 F (36.8 C) 98.3 F (36.8 C)   TempSrc:  Oral Oral   SpO2:    96%  Weight: 77.4 kg     Height:        Intake/Output Summary (Last 24 hours) at 08/31/2021 1247 Last data filed at 08/31/2021 1201 Gross per 24 hour  Intake 1293.05 ml  Output 1420 ml  Net -126.95 ml   Filed  Weights   08/30/21 0500 08/31/21 0000 08/31/21 0500  Weight: 79.4 kg 77.4 kg 77.4 kg    Examination:  General: Frail debilitated and sick looking.  Tries to answer some basic questions but unsure how much he understands. Cardiovascular: S1-S2 normal.  Regular rate rhythm. Respiratory: Bilateral conducted airway sounds.  On room air. Gastrointestinal: Soft.  Nontender. Right-sided PCN with large amount of urine.  Clear. Left-sided PCN with pink urine, draining freely. Foley catheter with small amount of concentrated urine.    Data Reviewed: I have personally reviewed following labs and imaging studies  CBC: Recent Labs  Lab 08/27/21 0316 08/28/21 0434 08/29/21 0310 08/30/21 0320 08/31/21 0304  WBC 10.8* 11.0* 9.0 10.6* 9.6  NEUTROABS 7.2 7.0 5.8 7.7 7.0  HGB 7.4* 7.7* 7.2* 7.5* 7.4*  HCT 22.6* 24.4* 23.7* 24.4* 24.3*  MCV 95.4 97.2 99.2 100.0 99.6  PLT 76* 87* 84* 88* 86*   Basic Metabolic Panel: Recent Labs  Lab 08/25/21 0300 08/26/21 0106 08/27/21 1116 08/27/21 1649 08/28/21 0434 08/28/21 1626 08/29/21 0310 08/30/21 0320 08/31/21 0304  NA 136 143  --   --   --   --  149* 150* 149*  K 2.9* 4.8  --   --   --   --  4.1 3.9 3.7  CL 108 116*  --   --   --   --  113* 112* 113*  CO2 18* 17*  --   --   --   --  27 29 25   GLUCOSE 88 112*  --   --   --   --  104* 101* 100*  BUN 43* 45*  --   --   --   --  31* 31* 33*  CREATININE 3.37* 3.17*  --   --   --   --  2.87* 2.83* 3.05*  CALCIUM 8.2* 8.8*  --   --   --   --  8.3* 8.4* 8.5*  MG 1.9 2.2 1.9 1.8 1.9 1.9  --   --   --   PHOS 3.4 2.9 2.2* 2.3* 2.7 3.0  --   --   --    GFR: Estimated Creatinine Clearance: 17.4 mL/min (A) (by C-G formula based on SCr of 3.05 mg/dL (H)). Liver Function Tests: Recent Labs  Lab 08/25/21 0300  AST 11*  ALT 7  ALKPHOS 378*  BILITOT 0.8  PROT 6.0*  ALBUMIN 2.0*   No results for input(s): LIPASE, AMYLASE in the last 168 hours. No results for input(s): AMMONIA in the last 168  hours. Coagulation Profile: No results for input(s): INR, PROTIME in the last 168 hours. Cardiac Enzymes: No results for input(s): CKTOTAL, CKMB, CKMBINDEX, TROPONINI in the last 168 hours. BNP (last 3 results) No results for input(s): PROBNP in the last 8760 hours. HbA1C: No results for input(s): HGBA1C in the last 72 hours. CBG: Recent Labs  Lab 08/30/21 1944 08/31/21 0011 08/31/21 0408 08/31/21 0715 08/31/21 1120  GLUCAP 109* 95 95 87 92   Lipid Profile: No results for input(s): CHOL, HDL, LDLCALC, TRIG, CHOLHDL, LDLDIRECT in the last 72 hours. Thyroid Function Tests: No results for input(s): TSH, T4TOTAL, FREET4, T3FREE, THYROIDAB in the last 72 hours. Anemia Panel: No results for input(s): VITAMINB12, FOLATE, FERRITIN, TIBC, IRON, RETICCTPCT in the last 72 hours. Sepsis Labs: No results for input(s): PROCALCITON, LATICACIDVEN in the last 168 hours.   No results found for this or any previous visit (from the past 240 hour(s)).        Radiology Studies: DG Abd 1 View  Result Date: 08/30/2021 CLINICAL DATA:  Feeding tube placement. EXAM: ABDOMEN - 1 VIEW COMPARISON:  None. FINDINGS: Small amount 10 mm Gastrografin administered through feeding tube. Feeding tube tip appears coiled in the region of the stomach. 1 low resolution intraoperative spot views of the stomach were obtained. No fracture visible on the limited views. Total fluoroscopy time: 3 minutes 24 seconds IMPRESSION: 1. Small amount of Gastrografin identified in the stomach. Feeding tube tip coiled in the stomach. Electronically Signed   By: Ronney Asters M.D.   On: 08/30/2021 16:53   NM Bone Scan Whole Body  Result Date: 08/30/2021 CLINICAL DATA:  History of metastatic prostate cancer, staging. EXAM: NUCLEAR MEDICINE WHOLE BODY BONE SCAN TECHNIQUE: Whole body anterior and posterior images were obtained approximately 3 hours after intravenous injection of radiopharmaceutical. RADIOPHARMACEUTICALS:  20.7 mCi  Technetium-37m MDP IV COMPARISON:  CT August 19, 2021 FINDINGS: Bilateral kidneys are not visualized. There is abnormal osseous uptake throughout the axial and appendicular skeleton for instance involving the sternum, ribs, spine, pelvis and femurs. Additional abnormal multifocal radiotracer uptake involving the elbows, wrists, hands, knees, ankles and feet in a pattern most consistent with degenerative arthropathy. Photopenic defect in the distal femur and  proximal tibia at the left knee likely reflects a knee arthroplasty. IMPRESSION: Scintigraphic findings consistent with extensive osteoblastic metastatic disease involving the axial and appendicular skeleton. Electronically Signed   By: Dahlia Bailiff M.D.   On: 08/30/2021 17:35        Scheduled Meds:  sodium chloride   Intravenous Once   sodium chloride   Intravenous Once   Chlorhexidine Gluconate Cloth  6 each Topical Daily   magnesium oxide  400 mg Oral BID   multivitamin with minerals  1 tablet Per Tube Daily   oxybutynin  5 mg Oral QHS   Continuous Infusions:  cefTRIAXone (ROCEPHIN)  IV     dextrose 75 mL/hr at 08/30/21 1139   lactated ringers     sodium chloride irrigation       LOS: 13 days    Time spent: 30 minutes    Barb Merino, MD [facility]

## 2021-08-31 NOTE — Plan of Care (Signed)
  Problem: Nutrition: Goal: Adequate nutrition will be maintained Outcome: Progressing   Problem: Elimination: Goal: Will not experience complications related to urinary retention Outcome: Progressing   Problem: Safety: Goal: Ability to remain free from injury will improve Outcome: Progressing   

## 2021-08-31 NOTE — Progress Notes (Addendum)
Nutrition Follow-up  DOCUMENTATION CODES:   Non-severe (moderate) malnutrition in context of chronic illness  INTERVENTION:   Initiate tube feeding via NGT: Osmolite 1.2 at 30 ml/h and increase by 10 ml every 8 hrs to goal rate of 70 ml/h (1680 ml per day)  Prosource TF 45 ml daily   Free water flushes 125 ml q 4 hrs.   Provides 2056 kcal, 104 gm protein, 2127 ml free water daily.   Monitor magnesium, potassium, and phosphorus daily for at least 3 days, MD to replete as needed, as pt is at risk for refeeding syndrome.   Continue MVI with minerals daily per tube.   NUTRITION DIAGNOSIS:   Moderate Malnutrition related to chronic illness (dementia) as evidenced by mild fat depletion, moderate fat depletion, mild muscle depletion, moderate muscle depletion.  Ongoing  GOAL:   Patient will meet greater than or equal to 90% of their needs  Unmet  MONITOR:   TF tolerance, Labs, Weight trends, Skin, I & O's  REASON FOR ASSESSMENT:   Low [place]    ASSESSMENT:   Anthony Dudley is a 87 y.o. male with medical history significant of prostate cancer, CKD stage III, chronic indwelling Foley catheter, MDR UTI, dementia, bedbound at baseline, chronic diastolic CHF, hypertension, hyperlipidemia COVID infection in June 2020. He was sent to the ED from his nursing home for evaluation of worsening kidney function.  His creatinine was 2.5 on 07/09/21, 5.7 on 08/09/21, and now 6.1 on 08/16/21.  His hemoglobin was 6.6 on 08/10/21 and he received 2 units PRBCs at [facility], hemoglobin 9.0 at his facility on 08/16/21.  10/5- s/p BSE- advanced to dysphagia 1 diet with thin liquids; s/p cystoscopy and fulguration of bladder 10/6- s/p placement of bilateral PCNs 10/7- s/p BSE- downgraded to dysphagia 1 diet with honey thick liquids 10/14- cortrak tube placement unsuccessful 10/15- bedside NGT placement unsuccessful 10/17- NGT placed by IR  Reviewed I/O's: -132 ml  x 24 hours and -17.6 L  since admission  UOP: 1.4 L x 24 hours   Per urology notes, plan for internalization of bilateral nephrostomy rubes today.   Pt sleeping soundly at time of visit. RD did not disturb.   Pt currently NPO for procedure. He remains with minimal intake (0%).   Case discussed with RN and MD. Reviewed x-ray; per MD tube is ready to use after procedure today.   Per palliative notes, pt POA still desires aggressive care.   Labs reviewed: Na: 149, CBGS: 87-109.    Diet Order:   Diet Order             Diet NPO time specified Except for: Sips with Meds  Diet effective midnight                   EDUCATION NEEDS:   Not appropriate for education at this time  Skin:  Skin Assessment: Skin Integrity Issues: Skin Integrity Issues:: Incisions Incisions: closed penis  Last BM:  08/28/21  Height:   Ht Readings from Last 1 Encounters:  08/20/21 5\' 9"  (1.753 m)    Weight:   Wt Readings from Last 1 Encounters:  08/31/21 77.4 kg    Ideal Body Weight:  72.7 kg  BMI:  Body mass index is 25.2 kg/m.  Estimated Nutritional Needs:   Kcal:  2000-2200  Protein:  95-110 grams  Fluid:  > 2 L    Loistine Chance, RD, LDN, [place] Registered Dietitian II Certified Diabetes Care and Education Specialist Please refer  to [facility] for RD and/or RD on-call/weekend/after hours pager

## 2021-08-31 NOTE — Progress Notes (Signed)
Patient ID: Anthony Dudley, male   DOB: 09-10-1933, 87 y.o.   MRN: [id number]  Renal function stabilized.  Internalization of bilateral nephrostomy tubes to ureteral stents per IR delayed until today.

## 2021-09-01 DIAGNOSIS — N170 Acute kidney failure with tubular necrosis: Secondary | ICD-10-CM

## 2021-09-01 DIAGNOSIS — Z4659 Encounter for fitting and adjustment of other gastrointestinal appliance and device: Secondary | ICD-10-CM | POA: Diagnosis not present

## 2021-09-01 DIAGNOSIS — Z0189 Encounter for other specified special examinations: Secondary | ICD-10-CM

## 2021-09-01 DIAGNOSIS — N179 Acute kidney failure, unspecified: Secondary | ICD-10-CM | POA: Diagnosis not present

## 2021-09-01 LAB — PHOSPHORUS
Phosphorus: 2.4 mg/dL — ABNORMAL LOW (ref 2.5–4.6)
Phosphorus: 2.8 mg/dL (ref 2.5–4.6)

## 2021-09-01 LAB — BASIC METABOLIC PANEL
Anion gap: 7 (ref 5–15)
BUN: 37 mg/dL — ABNORMAL HIGH (ref 8–23)
CO2: 25 mmol/L (ref 22–32)
Calcium: 8.4 mg/dL — ABNORMAL LOW (ref 8.9–10.3)
Chloride: 112 mmol/L — ABNORMAL HIGH (ref 98–111)
Creatinine, Ser: 3 mg/dL — ABNORMAL HIGH (ref 0.61–1.24)
GFR, Estimated: 20 mL/min — ABNORMAL LOW (ref >60)
Glucose, Bld: 121 mg/dL — ABNORMAL HIGH (ref 70–99)
Potassium: 4 mmol/L (ref 3.5–5.1)
Sodium: 144 mmol/L (ref 135–145)

## 2021-09-01 LAB — GLUCOSE, CAPILLARY
Glucose-Capillary: 101 mg/dL — ABNORMAL HIGH (ref 70–99)
Glucose-Capillary: 108 mg/dL — ABNORMAL HIGH (ref 70–99)
Glucose-Capillary: 110 mg/dL — ABNORMAL HIGH (ref 70–99)
Glucose-Capillary: 112 mg/dL — ABNORMAL HIGH (ref 70–99)
Glucose-Capillary: 120 mg/dL — ABNORMAL HIGH (ref 70–99)
Glucose-Capillary: 123 mg/dL — ABNORMAL HIGH (ref 70–99)
Glucose-Capillary: 133 mg/dL — ABNORMAL HIGH (ref 70–99)

## 2021-09-01 LAB — CBC WITH DIFFERENTIAL/PLATELET
Abs Immature Granulocytes: 0.08 10*3/uL — ABNORMAL HIGH (ref 0.00–0.07)
Basophils Absolute: 0 10*3/uL (ref 0.0–0.1)
Basophils Relative: 0 %
Eosinophils Absolute: 0.2 10*3/uL (ref 0.0–0.5)
Eosinophils Relative: 2 %
HCT: 22.2 % — ABNORMAL LOW (ref 39.0–52.0)
Hemoglobin: 6.8 g/dL — CL (ref 13.0–17.0)
Immature Granulocytes: 1 %
Lymphocytes Relative: 17 %
Lymphs Abs: 1.8 10*3/uL (ref 0.7–4.0)
MCH: 30.9 pg (ref 26.0–34.0)
MCHC: 30.6 g/dL (ref 30.0–36.0)
MCV: 100.9 fL — ABNORMAL HIGH (ref 80.0–100.0)
Monocytes Absolute: 0.5 10*3/uL (ref 0.1–1.0)
Monocytes Relative: 5 %
Neutro Abs: 7.9 10*3/uL — ABNORMAL HIGH (ref 1.7–7.7)
Neutrophils Relative %: 75 %
Platelets: 89 10*3/uL — ABNORMAL LOW (ref 150–400)
RBC: 2.2 MIL/uL — ABNORMAL LOW (ref 4.22–5.81)
RDW: 17.3 % — ABNORMAL HIGH (ref 11.5–15.5)
Smear Review: DECREASED
WBC: 10.5 10*3/uL (ref 4.0–10.5)
nRBC: 0 % (ref 0.0–0.2)

## 2021-09-01 LAB — FOLATE: Folate: 3.2 ng/mL — ABNORMAL LOW (ref >5.9)

## 2021-09-01 LAB — HEMOGLOBIN AND HEMATOCRIT, BLOOD
HCT: 25.5 % — ABNORMAL LOW (ref 39.0–52.0)
Hemoglobin: 8.3 g/dL — ABNORMAL LOW (ref 13.0–17.0)

## 2021-09-01 LAB — VITAMIN B12: Vitamin B-12: 1096 pg/mL — ABNORMAL HIGH (ref 180–914)

## 2021-09-01 LAB — RETICULOCYTES
Immature Retic Fract: 20.8 % — ABNORMAL HIGH (ref 2.3–15.9)
RBC.: 2.16 MIL/uL — ABNORMAL LOW (ref 4.22–5.81)
Retic Count, Absolute: 29.6 10*3/uL (ref 19.0–186.0)
Retic Ct Pct: 1.4 % (ref 0.4–3.1)

## 2021-09-01 LAB — MAGNESIUM
Magnesium: 2 mg/dL (ref 1.7–2.4)
Magnesium: 1.9 mg/dL (ref 1.7–2.4)

## 2021-09-01 LAB — IRON AND TIBC
Iron: 42 ug/dL — ABNORMAL LOW (ref 45–182)
Saturation Ratios: 28 % (ref 17.9–39.5)
TIBC: 150 ug/dL — ABNORMAL LOW (ref 250–450)
UIBC: 108 ug/dL

## 2021-09-01 LAB — PREPARE RBC (CROSSMATCH)

## 2021-09-01 LAB — FERRITIN: Ferritin: 1063 ng/mL — ABNORMAL HIGH (ref 24–336)

## 2021-09-01 MED ORDER — SODIUM CHLORIDE 0.9% IV SOLUTION: Freq: Once | INTRAVENOUS | Status: AC

## 2021-09-01 NOTE — Progress Notes (Signed)
[facility]                                                                              Patient Demographics  Anthony Dudley, is a 87 y.o. male, DOB - 08/29/1933, CLE:[id number]  Admit date - 08/17/2021   Admitting Physician Shela Leff, MD  Outpatient Primary MD for the patient is [name], MD  Outpatient specialists:   LOS - 14  days   Medical records reviewed and are as summarized below:    Chief Complaint  Patient presents with   Acute Renal Failure   Abnormal Lab       Brief summary   Patient is a 87 year old male with metastatic prostate CA, chronic indwelling Foley, CKD stage3, MDR, dementia, chronic bedbound status, diastolic CHF, hypertension, multiple other medical problems presented with AKI on CKD from the SNF.  Creatinine reportedly 2.5 on 8/26, 6.1 on 10/3.  He was recently transfused with hemoglobin of 6.6 at [facility] on 9/27. Patient was admitted with sepsis secondary to UTI and AKI on CKD.  Urology was consulted for hydronephrosis, underwent cystoscopy and fulguration of bladder on 10/5 with failed attempted stent placement.  IR was consulted for bilateral nephrostomy tubes, placed on 10/6.  Hospitalization was complicated by SVT, started on amiodarone.  Cardiology and PCCM were consulted.  Renal function has been gradually improving however patient remains very debilitated, poor oral intake, FTT.   Assessment & Plan    Principal Problem:   Acute-on-chronic kidney injury ([place]) stage IIIa with bilateral hydronephrosis, metabolic acidosis -Baseline creatinine reported to be 2.5, presented with creatinine of 6.1 -Underwent bilateral PCN by IR, urology and IR following.   -Underwent bilateral antegrade ureteral stents and replacement of bilateral nephrostomy tube by IR on 10/18  -Follow renal function.   -per urology, will consider removal of nephrostomy tubes under fluoroscopy per IR on 10/20 or 10/21.  Continue urethral  catheter   Active Problems: Sepsis secondary to UTI, POA due to chronic indwelling Foley catheter, bilateral multifocal pneumonia -Patient presented with tachypnea, tachycardia, leukocytosis.  Prior history of MDR Proteus, was treated with cefepime, vancomycin, completed 7 days to cover both UTI and pneumonia. -Foley was exchanged in ED  Acute on chronic anemia of blood loss -Multiple chronic illness, hematuria -Hemoglobin 6.8, baseline has been ~7.5 -Transfused 1 unit packed RBCs, follow anemia panel  Hypernatremia -Due to poor oral intake, FTT, started on core track feeding -Added free water 125 cc every 4 hours, will follow BMET  Acute hypoxemic respiratory failure -With underlying severe multilobular bilateral bronchopneumonia -2D echo showed normal EF -Poor oral intake, negative balance of 17.1 L -O2 sats 96% on room air  SVT -Currently in normal sinus rhythm, initially was started on amiodarone, has been off -2D echo showed normal EF, TSH normal    Malnutrition of moderate degree, failure to thrive, poor oral intake -Patient started on core track feeding - Dr. Raelyn Mora discussed goals of care with patient's brother on 10/17, recommended hospice however patient's brother is not ready for palliation/hospice. -Continue nutritional supplements  Code Status: Full CODE STATUS DVT Prophylaxis:  Place and maintain sequential compression  device Start: 08/21/21 0754 SCDs Start: 08/18/21 0235   Level of Care: Level of care: Progressive Family Communication:    Disposition Plan:     Status is: Inpatient  Remains inpatient appropriate because: IV treatments appropriate due to intensity of illness, unable to take p.o., on core track feeding  Time Spent in minutes   35 minutes  Procedures:  Cystoscopy Bilateral PCN Placement of bilateral antegrade ureteral stents  Consultants:   Urology PCCM Cardiology Palliative medicine IR  Antimicrobials:   Anti-infectives  (From admission, onward)    Start     Dose/Rate Route Frequency Ordered Stop   08/31/21 0600  cefTRIAXone (ROCEPHIN) 1 g in sodium chloride 0.9 % 100 mL IVPB       Note to Pharmacy: To be given in IR for IR procedure tentatively scheduled for 10.18.22   1 g 200 mL/hr over 30 Minutes Intravenous To Radiology 08/30/21 1608 08/31/21 1503   08/27/21 0000  cefTRIAXone (ROCEPHIN) 2 g in sodium chloride 0.9 % 100 mL IVPB  Status:  Discontinued        2 g 200 mL/hr over 30 Minutes Intravenous To Radiology 08/26/21 1419 08/27/21 1615   08/23/21 2200  vancomycin (VANCOCIN) IVPB 1000 mg/200 mL premix        1,000 mg 200 mL/hr over 60 Minutes Intravenous  Once 08/23/21 1631 08/23/21 2256   08/22/21 1800  ceFEPIme (MAXIPIME) 2 g in sodium chloride 0.9 % 100 mL IVPB        2 g 200 mL/hr over 30 Minutes Intravenous Every 24 hours 08/22/21 0727 08/24/21 1848   08/21/21 0830  vancomycin (VANCOREADY) IVPB 1250 mg/250 mL        1,250 mg 166.7 mL/hr over 90 Minutes Intravenous  Once 08/21/21 0733 08/21/21 1842   08/20/21 1800  ceFEPIme (MAXIPIME) 1 g in sodium chloride 0.9 % 100 mL IVPB  Status:  Discontinued        1 g 200 mL/hr over 30 Minutes Intravenous Every 24 hours 08/20/21 1457 08/22/21 0727   08/19/21 1800  ceFEPIme (MAXIPIME) 2 g in sodium chloride 0.9 % 100 mL IVPB  Status:  Discontinued        2 g 200 mL/hr over 30 Minutes Intravenous Every 24 hours 08/19/21 1553 08/20/21 1457   08/19/21 1645  vancomycin (VANCOREADY) IVPB 1500 mg/300 mL        1,500 mg 150 mL/hr over 120 Minutes Intravenous  Once 08/19/21 1551 08/19/21 1911   08/19/21 1551  vancomycin variable dose per unstable renal function (pharmacist dosing)  Status:  Discontinued         Does not apply See admin instructions 08/19/21 1551 08/25/21 0758   08/19/21 1545  ceFAZolin (ANCEF) IVPB 2g/100 mL premix        2 g 200 mL/hr over 30 Minutes Intravenous To Radiology 08/19/21 1452 08/19/21 1655   08/19/21 0000  ceFEPIme (MAXIPIME) 1 g  in sodium chloride 0.9 % 100 mL IVPB  Status:  Discontinued        1 g 200 mL/hr over 30 Minutes Intravenous Every 24 hours 08/18/21 0254 08/19/21 1553   08/18/21 0115  ceFEPIme (MAXIPIME) 1 g in sodium chloride 0.9 % 100 mL IVPB        1 g 200 mL/hr over 30 Minutes Intravenous  Once 08/18/21 0111 08/18/21 0453          Medications  Scheduled Meds:  sodium chloride   Intravenous Once   sodium chloride   Intravenous  Once   sodium chloride   Intravenous Once   Chlorhexidine Gluconate Cloth  6 each Topical Daily   feeding supplement (PROSource TF)  45 mL Per Tube Daily   free water  125 mL Per Tube Q4H   magnesium oxide  400 mg Oral BID   multivitamin with minerals  1 tablet Per Tube Daily   oxybutynin  5 mg Oral QHS   Continuous Infusions:  feeding supplement (OSMOLITE 1.2 CAL) 1,000 mL (09/01/21 0759)   sodium chloride irrigation     PRN Meds:.[EXPIRED] acetaminophen **FOLLOWED BY** acetaminophen, fentaNYL (SUBLIMAZE) injection, fentaNYL, food thickener, lidocaine (PF), lidocaine (PF), LORazepam, metoprolol tartrate, midazolam      Subjective:   Anthony Dudley was seen and examined today.  Able to open eyes and withdraws to tactile stimuli but no meaningful conversation.  States he feels okay and does not need anything.  No acute events overnight.    Objective:   Vitals:   09/01/21 0400 09/01/21 0500 09/01/21 0700 09/01/21 0956  BP: (!) 105/51  (!) 91/44 (!) 98/50  Pulse: 86  83 (!) 108  Resp: (!) 27  16 (!) 22  Temp: 99.2 F (37.3 C)  98.8 F (37.1 C) 98.4 F (36.9 C)  TempSrc: Axillary   Oral  SpO2: 100%  96% 96%  Weight:  76.4 kg    Height:        Intake/Output Summary (Last 24 hours) at 09/01/2021 1006 Last data filed at 09/01/2021 0409 Gross per 24 hour  Intake 1368.51 ml  Output 695 ml  Net 673.51 ml     Wt Readings from Last 3 Encounters:  09/01/21 76.4 kg     Exam General: Alert elderly, frail, ill-appearing Cardiovascular: S1 S2  auscultated, RRR Respiratory: Clear to auscultation bilaterally, no wheezing Gastrointestinal: Soft, nontender, nondistended, + bowel sounds Ext: no pedal edema bilaterally Neuro: does not follow commands, withdraws to tactile stimuli   Data Reviewed:  I have personally reviewed following labs and imaging studies  Micro Results No results found for this or any previous visit (from the past 240 hour(s)).  Radiology Reports CT ABDOMEN PELVIS WO CONTRAST  Result Date: 08/19/2021 CLINICAL DATA:  Acute renal failure.  Bilateral hydronephrosis. EXAM: CT ABDOMEN AND PELVIS WITHOUT CONTRAST TECHNIQUE: Multidetector CT imaging of the abdomen and pelvis was performed following the standard protocol without IV contrast. COMPARISON:  12/07/2020 FINDINGS: Lower chest: Trace bilateral pleural effusions. Hepatobiliary: No focal liver abnormality is seen. No gallstones, gallbladder wall thickening, or biliary dilatation. Pancreas: Unremarkable. No pancreatic ductal dilatation or surrounding inflammatory changes. Spleen: Normal in size without focal abnormality. Adrenals/Urinary Tract: Adrenal glands are unremarkable. Moderate bilateral hydronephrosis and hydroureter. 6 mm nonobstructing calculus of the lower pole of the right kidney. 8 mm nonobstructing calculus at the lower pole of the left kidney. 4 mm calculus within the dependent portion of the bladder. The bladder is collapsed around a Foley catheter balloon which limits evaluation. Stomach/Bowel: No bowel dilatation to indicate ileus or obstruction. Extensive sigmoid and descending colon diverticulosis without evidence of acute diverticulitis. Vascular/Lymphatic: Atherosclerotic calcifications seen throughout the abdominal aorta without aneurysm. No enlarged abdominal pelvic lymph nodes. Reproductive: Calcifications noted within the prostate. Other: No abdominal wall hernia or abnormality. No abdominopelvic ascites. Musculoskeletal: Diffuse osteoblastic  metastatic disease again seen. Unchanged moderate to severe compression deformity of the L1 vertebral body. IMPRESSION: Moderate bilateral hydronephrosis and hydroureter. Bilateral nonobstructing renal calculi. Electronically Signed   By: Miachel Roux M.D.   On: 08/19/2021 13:43  DG Abd 1 View  Result Date: 08/30/2021 CLINICAL DATA:  Feeding tube placement. EXAM: ABDOMEN - 1 VIEW COMPARISON:  None. FINDINGS: Small amount 10 mm Gastrografin administered through feeding tube. Feeding tube tip appears coiled in the region of the stomach. 1 low resolution intraoperative spot views of the stomach were obtained. No fracture visible on the limited views. Total fluoroscopy time: 3 minutes 24 seconds IMPRESSION: 1. Small amount of Gastrografin identified in the stomach. Feeding tube tip coiled in the stomach. Electronically Signed   By: Ronney Asters M.D.   On: 08/30/2021 16:53   DG Abd 1 View  Result Date: 08/28/2021 CLINICAL DATA:  Shortness of breath, nausea, vomiting. EXAM: ABDOMEN - 1 VIEW COMPARISON:  CT abdomen dated 08/19/2021. FINDINGS: New bilateral percutaneous nephrostomy tubes in place. No dilated large or small bowel loops are identified. Patient's RIGHT hand obscures visualization of the RIGHT lower quadrant. Blastic lesions throughout the visualized osseous structures, corresponding to previously described metastatic disease. IMPRESSION: 1. No acute findings. Nonobstructive bowel gas pattern. 2. Bilateral percutaneous nephrostomy tubes in place. 3. Diffuse osseous metastases, as previously described. Electronically Signed   By: Franki Cabot M.D.   On: 08/28/2021 10:20   NM Bone Scan Whole Body  Result Date: 08/30/2021 CLINICAL DATA:  History of metastatic prostate cancer, staging. EXAM: NUCLEAR MEDICINE WHOLE BODY BONE SCAN TECHNIQUE: Whole body anterior and posterior images were obtained approximately 3 hours after intravenous injection of radiopharmaceutical. RADIOPHARMACEUTICALS:  20.7 mCi  Technetium-58m MDP IV COMPARISON:  CT August 19, 2021 FINDINGS: Bilateral kidneys are not visualized. There is abnormal osseous uptake throughout the axial and appendicular skeleton for instance involving the sternum, ribs, spine, pelvis and femurs. Additional abnormal multifocal radiotracer uptake involving the elbows, wrists, hands, knees, ankles and feet in a pattern most consistent with degenerative arthropathy. Photopenic defect in the distal femur and proximal tibia at the left knee likely reflects a knee arthroplasty. IMPRESSION: Scintigraphic findings consistent with extensive osteoblastic metastatic disease involving the axial and appendicular skeleton. Electronically Signed   By: Dahlia Bailiff M.D.   On: 08/30/2021 17:35   DG Cystogram  Result Date: 08/19/2021 CLINICAL DATA:  Surgery, elective. EXAM: CYSTOGRAM TECHNIQUE: A single intraprocedural fluoroscopic image from reported cystoscopy and bladder fulguration is submitted. FLUOROSCOPY TIME:  Fluoroscopy Time:  14 seconds COMPARISON:  Renal ultrasound 08/18/2021. CT abdomen/pelvis 06/06/2021. FINDINGS: A single intraprocedural fluoroscopic image of the pelvis from reported cystoscopy and bladder fulguration is submitted. On the provided image, a cystoscope projects in the region of the pelvis. Correlate with the procedural history. IMPRESSION: Single intraprocedural fluoroscopic image of the pelvis from reported cystoscopy and bladder fulguration. Please correlate with the procedural history. Electronically Signed   By: Kellie Simmering D.O.   On: 08/19/2021 09:36   US Renal  Result Date: 08/18/2021 CLINICAL DATA:  Renal failure EXAM: RENAL / URINARY TRACT ULTRASOUND COMPLETE COMPARISON:  06/06/2021 FINDINGS: Right Kidney: Renal measurements: 10.2 x 5.3 x 5.3 cm. = volume: 150 mL. Moderate hydronephrosis is noted. This has progressed slightly in the interval from the prior CT. Previously seen right renal calculus is not well appreciated on today's  exam. Left Kidney: Renal measurements: 9.8 x 5.5 x 4.5 cm. = volume: 128 mL. Moderate left-sided hydronephrosis is noted as well stable in appearance from prior CT. Previously seen renal calculus is not well appreciated on today's exam. Bladder: Foley catheter is noted within the bladder. Other: None. IMPRESSION: Bilateral hydronephrosis left slightly greater than right similar to that seen on  prior CT examination. Previously noted renal calculi bilaterally are not well appreciated on today's exam. Electronically Signed   By: Inez Catalina M.D.   On: 08/18/2021 00:30   DG CHEST PORT 1 VIEW  Result Date: 08/26/2021 CLINICAL DATA:  Shortness of breath. EXAM: PORTABLE CHEST 1 VIEW COMPARISON:  August 21, 2021 FINDINGS: Mild, bilateral multifocal infiltrates are seen. This is decreased in severity when compared to the prior study. There is no evidence of a pleural effusion or pneumothorax. The heart size and mediastinal contours are within normal limits. Moderate to marked severity calcification the aortic arch is noted. Diffusely sclerotic osseous structures are seen. IMPRESSION: Mild, bilateral multifocal infiltrates, decreased in severity when compared to the prior study. Electronically Signed   By: Virgina Norfolk M.D.   On: 08/26/2021 20:48   DG Chest Port 1 View  Result Date: 08/21/2021 CLINICAL DATA:  87 year old male with history of abnormal respiration. EXAM: PORTABLE CHEST 1 VIEW COMPARISON:  Chest x-ray 08/20/2021. FINDINGS: Lung volumes are low. Diffuse interstitial prominence and patchy ill-defined and nodular opacities are noted throughout the lungs bilaterally. Small left pleural effusion. No definite right pleural effusion. No appreciable pneumothorax. Pulmonary vasculature does not appearing origin. Heart size is normal. The patient is rotated to the left on today's exam, resulting in distortion of the mediastinal contours and reduced diagnostic sensitivity and specificity for mediastinal  pathology. Atherosclerotic calcifications in the thoracic aorta. IMPRESSION: 1. Severe multilobar bilateral bronchopneumonia again noted. 2. Small left pleural effusion. 3. Aortic atherosclerosis. Electronically Signed   By: Vinnie Langton M.D.   On: 08/21/2021 10:25   DG CHEST PORT 1 VIEW  Result Date: 08/20/2021 CLINICAL DATA:  Hypoxia, respiratory failure, confusion. EXAM: PORTABLE CHEST 1 VIEW COMPARISON:  Chest radiograph 08/19/2021, CT abdomen pelvis 08/19/2021 FINDINGS: Diffuse hazy interstitial thickening and prominence of the perihilar vasculature. Unchanged retrocardiac left lower lobe opacity. No pneumothorax. Stable cardiomediastinal silhouette. Aortic calcifications. Numerous sclerotic osseous lesions, particularly within thoracolumbar spine, better appreciated on yesterday's cross-sectional imaging. IMPRESSION: Mild pulmonary edema. Retrocardiac left lower lobe opacity may represent combination of pleural fluid and atelectasis, though pneumonia is not excluded. Electronically Signed   By: Ileana Roup M.D.   On: 08/20/2021 09:32   DG CHEST PORT 1 VIEW  Result Date: 08/19/2021 CLINICAL DATA:  Hypoxia. EXAM: PORTABLE CHEST 1 VIEW COMPARISON:  08/17/2021 FINDINGS: The cardiomediastinal silhouette is unchanged with normal heart size. Aortic atherosclerosis is noted. Lung volumes are low there are increasing patchy airspace opacities throughout the left lung with milder airspace opacities on the right. There is persistent elevation of the left hemidiaphragm. No large pleural effusion or pneumothorax is identified although the patient's chin partially obscures the lung apices. Widespread sclerotic bone metastases are again noted. IMPRESSION: Increasing left greater than right lung airspace opacities concerning for pneumonia. Electronically Signed   By: Logan Bores M.D.   On: 08/19/2021 14:58   DG Chest Port 1 View  Result Date: 08/17/2021 CLINICAL DATA:  Shortness of breath EXAM: PORTABLE  CHEST 1 VIEW COMPARISON:  06/06/2021, CT 06/06/2021 FINDINGS: Chronic scarring in the left upper lobe. No acute airspace disease or pleural effusion. Stable cardiomediastinal silhouette with aortic atherosclerosis. Patchy bilateral skeletal sclerosis consistent with osseous metastatic disease. IMPRESSION: 1. Scarring in the left upper lobe.  No acute airspace disease. 2. Diffuse rib sclerosis consistent with skeletal metastatic disease Electronically Signed   By: Donavan Foil M.D.   On: 08/17/2021 23:41   ECHOCARDIOGRAM COMPLETE  Result Date: 08/21/2021  ECHOCARDIOGRAM REPORT   Patient Name:   DEAUNDRA KUTZER Date of Exam: 08/21/2021 Medical Rec #:  [id number]      Height:       69.0 in Accession #:    [id number]     Weight:       180.8 lb Date of Birth:  1933-04-08      BSA:          1.979 m Patient Age:    55 years       BP:           122/78 mmHg Patient Gender: M              HR:           110 bpm. Exam Location:  Inpatient Procedure: 2D Echo, Color Doppler and Cardiac Doppler Indications:    Ventricular Tachycardia I47.2  History:        Patient has no prior history of Echocardiogram examinations.  Sonographer:    Bernadene Person RDCS Referring Phys: [id number] [place] Comments: Image acquisition challenging due to patient behavioral factors. and Image acquisition challenging due to uncooperative patient. IMPRESSIONS  1. Left ventricular ejection fraction, by estimation, is 55 to 60%. The left ventricle has normal function. The left ventricle has no regional wall motion abnormalities. There is mild left ventricular hypertrophy. Left ventricular diastolic parameters are indeterminate.  2. Right ventricular systolic function was not well visualized. The right ventricular size is not well visualized. There is normal pulmonary artery systolic pressure.  3. The mitral valve is normal in structure. Trivial mitral valve regurgitation. No evidence of mitral stenosis.  4. The aortic valve was not  well visualized. Aortic valve regurgitation is not visualized. Mild to moderate aortic valve sclerosis/calcification is present, without any evidence of aortic stenosis. FINDINGS  Left Ventricle: Left ventricular ejection fraction, by estimation, is 55 to 60%. The left ventricle has normal function. The left ventricle has no regional wall motion abnormalities. The left ventricular internal cavity size was normal in size. There is  mild left ventricular hypertrophy. Left ventricular diastolic parameters are indeterminate. Right Ventricle: The right ventricular size is not well visualized. Right vetricular wall thickness was not well visualized. Right ventricular systolic function was not well visualized. There is normal pulmonary artery systolic pressure. The tricuspid regurgitant velocity is 2.36 m/s, and with an assumed right atrial pressure of 3 mmHg, the estimated right ventricular systolic pressure is 96.2 mmHg. Left Atrium: Left atrial size was normal in size. Right Atrium: Right atrial size was normal in size. Pericardium: There is no evidence of pericardial effusion. Mitral Valve: The mitral valve is normal in structure. Trivial mitral valve regurgitation. No evidence of mitral valve stenosis. Tricuspid Valve: The tricuspid valve is normal in structure. Tricuspid valve regurgitation is trivial. Aortic Valve: The aortic valve was not well visualized. Aortic valve regurgitation is not visualized. Mild to moderate aortic valve sclerosis/calcification is present, without any evidence of aortic stenosis. Pulmonic Valve: The pulmonic valve was not well visualized. Pulmonic valve regurgitation is not visualized. Aorta: The aortic root and ascending aorta are structurally normal, with no evidence of dilitation. IAS/Shunts: The interatrial septum was not well visualized.  LEFT VENTRICLE PLAX 2D LVIDd:         3.60 cm LVIDs:         2.60 cm LV PW:         0.90 cm LV IVS:        1.10 cm LVOT diam:  2.20 cm LV SV:          71 LV SV Index:   36 LVOT Area:     3.80 cm  RIGHT VENTRICLE TAPSE (M-mode): 1.6 cm LEFT ATRIUM             Index        RIGHT ATRIUM           Index LA diam:        3.10 cm 1.57 cm/m   RA Area:     11.30 cm LA Vol (A2C):   30.6 ml 15.46 ml/m  RA Volume:   22.60 ml  11.42 ml/m LA Vol (A4C):   31.3 ml 15.81 ml/m LA Biplane Vol: 31.1 ml 15.71 ml/m  AORTIC VALVE LVOT Vmax:   111.00 cm/s LVOT Vmean:  71.000 cm/s LVOT VTI:    0.187 m  AORTA Ao Asc diam: 3.50 cm TRICUSPID VALVE TR Peak grad:   22.3 mmHg TR Vmax:        236.00 cm/s  SHUNTS Systemic VTI:  0.19 m Systemic Diam: 2.20 cm Dani Gobble Croitoru MD Electronically signed by Sanda Klein MD Signature Date/Time: 08/21/2021/1:58:30 PM    Final    IR NEPHROSTOMY PLACEMENT LEFT  Result Date: 08/20/2021 INDICATION: History of metastatic prostate cancer, now with bilateral obstructive uropathy. Please perform placement of bilateral nephrostomy catheters for urinary diversion purposes. EXAM: ULTRASOUND AND FLUOROSCOPIC GUIDED PLACEMENT OF BILATERAL NEPHROSTOMY TUBES COMPARISON:  CT abdomen pelvis-08/19/2021 MEDICATIONS: Patient is currently admitted to the hospital receiving intravenous antibiotics.; The antibiotic was administered in an appropriate time frame prior to skin puncture. ANESTHESIA/SEDATION: Moderate (conscious) sedation was employed during this procedure, administered by the interventional radiology RN. A total of Versed 0.5 mg was administered intravenously. Moderate Sedation Time: 25 minutes. The patient's level of consciousness and vital signs were monitored continuously by radiology nursing throughout the procedure under my direct supervision. CONTRAST:  40 mL Omnipaque 350-administered into both renal collecting systems. FLUOROSCOPY TIME:  1 minute, 48 seconds (1.3 mGy) COMPLICATIONS: None immediate. PROCEDURE: The procedure, risks, benefits, and alternatives were explained to the the patient's family, questions were encouraged and answered and  informed consent was obtained. A timeout was performed prior to the initiation of the procedure. The operative sites were prepped and draped in the usual sterile fashion and a sterile drape was applied covering the operative field. A sterile gown and sterile gloves were used for the procedure. Local anesthesia was provided with 1% Lidocaine with epinephrine. Beginning with the left kidney, ultrasound was used to localize the left kidney. Under direct ultrasound guidance, a 20 gauge needle was advanced into the renal collecting system. An ultrasound image documentation was performed. Access within the collecting system was confirmed with the efflux of urine followed by limited contrast injection. Under intermittent fluoroscopic guidance, an 0.018 wire was advanced into the collecting system and the tract was dilated with an Accustick stent. Next, over a short Amplatz wire, the track was further dilated ultimately allowing placement of a 10-French percutaneous nephrostomy catheter with end coiled and locked within the renal pelvis. Contrast was injected and several spot fluoroscopic images were obtained in various obliquities. The contralateral procedure was repeated for the right-sided nephrostomy catheter however note, an upper pole access was acquired secondary to angulation of the right kidney with the inferior pole being located deep and medial with poor sonographic visualization, accentuated due to patient's medical instability and inability to lie prone on the fluoroscopy table. Ultimately, under ultrasound fluoroscopic guidance,  a 10 French nephrostomy catheter was placed via a posterosuperior calyx with end coiled and locked within the right renal pelvis. Contrast was injected several spot fluoroscopic images were obtained in various obliquities Both catheters were secured at the skin entrance site within interrupted sutures and StatLock devices. Both nephrostomies were connected to gravity bags. Dressings  were applied. The patient tolerated procedure well without immediate postprocedural complication. FINDINGS: Ultrasound scanning demonstrates a moderate dilated bilateral collecting systems. Under a combination of ultrasound and fluoroscopic guidance, a left-sided posterior inferior calix was targeted allowing placement of a 10-French percutaneous nephrostomy catheter with end coiled and locked within the renal pelvis. Contrast injection confirmed appropriate positioning. Under a combination of ultrasound and fluoroscopic guidance, a right-sided posterosuperior calyx was targeted (an inferior calyx was unable to be accessed secondary to angulation of the right kidney as well as patient's somewhat unstable status and inability to lie on the fluoroscopy table) allowing placement of a 10 French percutaneous nephrostomy catheter with end coiled and locked within the renal pelvis. Contrast injection confirmed appropriate positioning. IMPRESSION: Successful ultrasound and fluoroscopic guided placement of bilateral 10 French percutaneous nephrostomy catheters. Note, as above, the left-sided nephrostomy catheter is via a posteroinferior calyx while the right-sided nephrostomy catheter is via a posterosuperior caliceal access. Electronically Signed   By: Sandi Mariscal M.D.   On: 08/20/2021 12:43   IR NEPHROSTOMY PLACEMENT RIGHT  Result Date: 08/20/2021 INDICATION: History of metastatic prostate cancer, now with bilateral obstructive uropathy. Please perform placement of bilateral nephrostomy catheters for urinary diversion purposes. EXAM: ULTRASOUND AND FLUOROSCOPIC GUIDED PLACEMENT OF BILATERAL NEPHROSTOMY TUBES COMPARISON:  CT abdomen pelvis-08/19/2021 MEDICATIONS: Patient is currently admitted to the hospital receiving intravenous antibiotics.; The antibiotic was administered in an appropriate time frame prior to skin puncture. ANESTHESIA/SEDATION: Moderate (conscious) sedation was employed during this procedure,  administered by the interventional radiology RN. A total of Versed 0.5 mg was administered intravenously. Moderate Sedation Time: 25 minutes. The patient's level of consciousness and vital signs were monitored continuously by radiology nursing throughout the procedure under my direct supervision. CONTRAST:  40 mL Omnipaque 350-administered into both renal collecting systems. FLUOROSCOPY TIME:  1 minute, 48 seconds (1.3 mGy) COMPLICATIONS: None immediate. PROCEDURE: The procedure, risks, benefits, and alternatives were explained to the the patient's family, questions were encouraged and answered and informed consent was obtained. A timeout was performed prior to the initiation of the procedure. The operative sites were prepped and draped in the usual sterile fashion and a sterile drape was applied covering the operative field. A sterile gown and sterile gloves were used for the procedure. Local anesthesia was provided with 1% Lidocaine with epinephrine. Beginning with the left kidney, ultrasound was used to localize the left kidney. Under direct ultrasound guidance, a 20 gauge needle was advanced into the renal collecting system. An ultrasound image documentation was performed. Access within the collecting system was confirmed with the efflux of urine followed by limited contrast injection. Under intermittent fluoroscopic guidance, an 0.018 wire was advanced into the collecting system and the tract was dilated with an Accustick stent. Next, over a short Amplatz wire, the track was further dilated ultimately allowing placement of a 10-French percutaneous nephrostomy catheter with end coiled and locked within the renal pelvis. Contrast was injected and several spot fluoroscopic images were obtained in various obliquities. The contralateral procedure was repeated for the right-sided nephrostomy catheter however note, an upper pole access was acquired secondary to angulation of the right kidney with the inferior pole  being located deep and medial with poor sonographic visualization, accentuated due to patient's medical instability and inability to lie prone on the fluoroscopy table. Ultimately, under ultrasound fluoroscopic guidance, a 10 French nephrostomy catheter was placed via a posterosuperior calyx with end coiled and locked within the right renal pelvis. Contrast was injected several spot fluoroscopic images were obtained in various obliquities Both catheters were secured at the skin entrance site within interrupted sutures and StatLock devices. Both nephrostomies were connected to gravity bags. Dressings were applied. The patient tolerated procedure well without immediate postprocedural complication. FINDINGS: Ultrasound scanning demonstrates a moderate dilated bilateral collecting systems. Under a combination of ultrasound and fluoroscopic guidance, a left-sided posterior inferior calix was targeted allowing placement of a 10-French percutaneous nephrostomy catheter with end coiled and locked within the renal pelvis. Contrast injection confirmed appropriate positioning. Under a combination of ultrasound and fluoroscopic guidance, a right-sided posterosuperior calyx was targeted (an inferior calyx was unable to be accessed secondary to angulation of the right kidney as well as patient's somewhat unstable status and inability to lie on the fluoroscopy table) allowing placement of a 10 French percutaneous nephrostomy catheter with end coiled and locked within the renal pelvis. Contrast injection confirmed appropriate positioning. IMPRESSION: Successful ultrasound and fluoroscopic guided placement of bilateral 10 French percutaneous nephrostomy catheters. Note, as above, the left-sided nephrostomy catheter is via a posteroinferior calyx while the right-sided nephrostomy catheter is via a posterosuperior caliceal access. Electronically Signed   By: Sandi Mariscal M.D.   On: 08/20/2021 12:43   IR URETERAL STENT PLACEMENT  EXISTING ACCESS LEFT  Result Date: 08/31/2021 INDICATION: 87 year old with metastatic prostate cancer and bilateral obstructive uropathy. Bilateral percutaneous nephrostomy tubes were placed on 08/19/2021. Patient also has a Foley catheter. Urology has requested conversion to antegrade ureter stents. EXAM: 1. PLACEMENT OF BILATERAL ANTEGRADE URETER STENTS THROUGH EXISTING ACCESS 2. EXCHANGE OF BILATERAL NEPHROSTOMY TUBES MEDICATIONS: Rocephin 1 g; The antibiotic was administered in an appropriate time frame prior to skin puncture. ANESTHESIA/SEDATION: Moderate (conscious) sedation was employed during this procedure. A total of Versed 1.5mg  and fentanyl 75 mcg was administered intravenously at the order of the provider performing the procedure. Total intra-service moderate sedation time: 56 minutes. Patient's level of consciousness and vital signs were monitored continuously by radiology nurse throughout the procedure under the supervision of the provider performing the procedure. CONTRAST:  50 mL Omnipaque 300-administered into the collecting system(s) FLUOROSCOPY TIME:  Fluoroscopy Time: 18 minutes, 48 seconds, 93 mGy COMPLICATIONS: None immediate. PROCEDURE: Informed consent was obtained for placement of antegrade ureter stents. Patient was placed prone on the interventional table. Bilateral nephrostomy tubes were prepped and draped in sterile fashion. Maximal barrier sterile technique was utilized including caps, mask, sterile gowns, sterile gloves, sterile drape, hand hygiene and skin antiseptic. Left nephrostomy tube was injected with contrast. Skin was anesthetized with 1% lidocaine. Catheter was cut and removed over a Bentson wire. Kumpe catheter was advanced to the distal left ureter. Contrast injection demonstrated obstruction at the left ureterovesical junction. A Roadrunner wire was successfully advanced into the urinary bladder. Catheter was advanced over the wire into the urinary bladder. The Foley  catheter was clamped and contrast was injected in the bladder in order to create space around the Foley catheter balloon. Kumpe catheter was removed over a superstiff Amplatz wire. 9 French vascular sheath was placed and a Bentson wire was placed as a safety wire. An 8 [place], 22 cm ureter stent was advanced over the superstiff Amplatz wire and successfully  deployed within the urinary bladder and in the left renal pelvis. 8.5 [place] multipurpose drain was advanced over the safety wire and reconstituted in the renal pelvis. Contrast was injected. The nephrostomy tube was flushed with saline and capped. Catheter was sutured to skin. Attention was directed to the right nephrostomy tube. Nephrostomy tube was injected with contrast. Catheter was cut and removed over a Bentson wire. Kumpe catheter was advanced into the right ureter and there was an obstruction at the right ureterovesical junction. Roadrunner wire was successfully advanced into the urinary bladder and the catheter was advanced into the urinary bladder. Super stiff Amplatz wire was placed. 9 French vascular sheath was placed over the superstiff Amplatz wire and a Bentson wire was placed as a safety wire. 8 [place], 22 cm ureter stent was advanced over the superstiff Amplatz wire and deployed within the urinary bladder and in the right kidney upper pole calyx. 8.5 French nephrostomy tube was placed over the Bentson wire and deployed in the renal pelvis. Nephrostomy tube was flushed and capped. Nephrostomy tube was sutured to skin. FINDINGS: Obstruction at the bilateral ureterovesical junctions. Catheters and wires were successfully advanced beyond the ureterovesical junction obstruction bilaterally. 8 French, 22 cm ureter stents were placed bilaterally. Left ureter stent is positioned in the renal pelvis along with an 8.5 French nephrostomy tube. The right ureter stent is coiled in the right upper pole calyx. Right nephrostomy tube is coiled in the renal  pelvis. IMPRESSION: 1. Successful placement of bilateral antegrade ureter stents. 2. Replacement of bilateral nephrostomy tubes. Both nephrostomy tubes are capped. Nephrostomy tubes can be removed at a later time with fluoroscopy. Electronically Signed   By: Markus Daft M.D.   On: 08/31/2021 18:02   IR URETERAL STENT PLACEMENT EXISTING ACCESS RIGHT  Result Date: 08/31/2021 INDICATION: 87 year old with metastatic prostate cancer and bilateral obstructive uropathy. Bilateral percutaneous nephrostomy tubes were placed on 08/19/2021. Patient also has a Foley catheter. Urology has requested conversion to antegrade ureter stents. EXAM: 1. PLACEMENT OF BILATERAL ANTEGRADE URETER STENTS THROUGH EXISTING ACCESS 2. EXCHANGE OF BILATERAL NEPHROSTOMY TUBES MEDICATIONS: Rocephin 1 g; The antibiotic was administered in an appropriate time frame prior to skin puncture. ANESTHESIA/SEDATION: Moderate (conscious) sedation was employed during this procedure. A total of Versed 1.5mg  and fentanyl 75 mcg was administered intravenously at the order of the provider performing the procedure. Total intra-service moderate sedation time: 56 minutes. Patient's level of consciousness and vital signs were monitored continuously by radiology nurse throughout the procedure under the supervision of the provider performing the procedure. CONTRAST:  50 mL Omnipaque 300-administered into the collecting system(s) FLUOROSCOPY TIME:  Fluoroscopy Time: 18 minutes, 48 seconds, 93 mGy COMPLICATIONS: None immediate. PROCEDURE: Informed consent was obtained for placement of antegrade ureter stents. Patient was placed prone on the interventional table. Bilateral nephrostomy tubes were prepped and draped in sterile fashion. Maximal barrier sterile technique was utilized including caps, mask, sterile gowns, sterile gloves, sterile drape, hand hygiene and skin antiseptic. Left nephrostomy tube was injected with contrast. Skin was anesthetized with 1%  lidocaine. Catheter was cut and removed over a Bentson wire. Kumpe catheter was advanced to the distal left ureter. Contrast injection demonstrated obstruction at the left ureterovesical junction. A Roadrunner wire was successfully advanced into the urinary bladder. Catheter was advanced over the wire into the urinary bladder. The Foley catheter was clamped and contrast was injected in the bladder in order to create space around the Foley catheter balloon. Kumpe catheter was removed over a superstiff  Amplatz wire. 9 French vascular sheath was placed and a Bentson wire was placed as a safety wire. An 8 [place], 22 cm ureter stent was advanced over the superstiff Amplatz wire and successfully deployed within the urinary bladder and in the left renal pelvis. 8.5 [place] multipurpose drain was advanced over the safety wire and reconstituted in the renal pelvis. Contrast was injected. The nephrostomy tube was flushed with saline and capped. Catheter was sutured to skin. Attention was directed to the right nephrostomy tube. Nephrostomy tube was injected with contrast. Catheter was cut and removed over a Bentson wire. Kumpe catheter was advanced into the right ureter and there was an obstruction at the right ureterovesical junction. Roadrunner wire was successfully advanced into the urinary bladder and the catheter was advanced into the urinary bladder. Super stiff Amplatz wire was placed. 9 French vascular sheath was placed over the superstiff Amplatz wire and a Bentson wire was placed as a safety wire. 8 [place], 22 cm ureter stent was advanced over the superstiff Amplatz wire and deployed within the urinary bladder and in the right kidney upper pole calyx. 8.5 French nephrostomy tube was placed over the Bentson wire and deployed in the renal pelvis. Nephrostomy tube was flushed and capped. Nephrostomy tube was sutured to skin. FINDINGS: Obstruction at the bilateral ureterovesical junctions. Catheters and wires were  successfully advanced beyond the ureterovesical junction obstruction bilaterally. 8 French, 22 cm ureter stents were placed bilaterally. Left ureter stent is positioned in the renal pelvis along with an 8.5 French nephrostomy tube. The right ureter stent is coiled in the right upper pole calyx. Right nephrostomy tube is coiled in the renal pelvis. IMPRESSION: 1. Successful placement of bilateral antegrade ureter stents. 2. Replacement of bilateral nephrostomy tubes. Both nephrostomy tubes are capped. Nephrostomy tubes can be removed at a later time with fluoroscopy. Electronically Signed   By: Markus Daft M.D.   On: 08/31/2021 18:02    Lab Data:  CBC: Recent Labs  Lab 08/28/21 0434 08/29/21 0310 08/30/21 0320 08/31/21 0304 08/31/21 1724 09/01/21 0531  WBC 11.0* 9.0 10.6* 9.6 10.4 10.5  NEUTROABS 7.0 5.8 7.7 7.0  --  7.9*  HGB 7.7* 7.2* 7.5* 7.4* 7.5* 6.8*  HCT 24.4* 23.7* 24.4* 24.3* 24.6* 22.2*  MCV 97.2 99.2 100.0 99.6 100.4* 100.9*  PLT 87* 84* 88* 86* 85* 89*   Basic Metabolic Panel: Recent Labs  Lab 08/26/21 0106 08/27/21 1116 08/28/21 0434 08/28/21 1626 08/29/21 0310 08/30/21 0320 08/31/21 0304 08/31/21 1724 09/01/21 0531  NA 143  --   --   --  149* 150* 149*  --   --   K 4.8  --   --   --  4.1 3.9 3.7  --   --   CL 116*  --   --   --  113* 112* 113*  --   --   CO2 17*  --   --   --  27 29 25   --   --   GLUCOSE 112*  --   --   --  104* 101* 100*  --   --   BUN 45*  --   --   --  31* 31* 33*  --   --   CREATININE 3.17*  --   --   --  2.87* 2.83* 3.05*  --   --   CALCIUM 8.8*  --   --   --  8.3* 8.4* 8.5*  --   --   MG  2.2   < > 1.9 1.9  --   --  1.8 1.8 2.0  PHOS 2.9   < > 2.7 3.0  --   --  3.0 3.0 2.4*   < > = values in this interval not displayed.   GFR: Estimated Creatinine Clearance: 17.4 mL/min (A) (by C-G formula based on SCr of 3.05 mg/dL (H)). Liver Function Tests: No results for input(s): AST, ALT, ALKPHOS, BILITOT, PROT, ALBUMIN in the last 168 hours. No  results for input(s): LIPASE, AMYLASE in the last 168 hours. No results for input(s): AMMONIA in the last 168 hours. Coagulation Profile: Recent Labs  Lab 08/31/21 1724  INR 2.0*   Cardiac Enzymes: No results for input(s): CKTOTAL, CKMB, CKMBINDEX, TROPONINI in the last 168 hours. BNP (last 3 results) No results for input(s): PROBNP in the last 8760 hours. HbA1C: No results for input(s): HGBA1C in the last 72 hours. CBG: Recent Labs  Lab 08/31/21 1626 08/31/21 2005 09/01/21 0015 09/01/21 0406 09/01/21 0726  GLUCAP 100* 143* 123* 110* 133*   Lipid Profile: No results for input(s): CHOL, HDL, LDLCALC, TRIG, CHOLHDL, LDLDIRECT in the last 72 hours. Thyroid Function Tests: No results for input(s): TSH, T4TOTAL, FREET4, T3FREE, THYROIDAB in the last 72 hours. Anemia Panel: Recent Labs    09/01/21 0815  RETICCTPCT 1.4   Urine analysis:    Component Value Date/Time   COLORURINE YELLOW 08/17/2021 0034   APPEARANCEUR CLOUDY (A) 08/17/2021 0034   LABSPEC 1.009 08/17/2021 0034   PHURINE 6.0 08/17/2021 0034   GLUCOSEU NEGATIVE 08/17/2021 0034   HGBUR LARGE (A) 08/17/2021 0034   BILIRUBINUR NEGATIVE 08/17/2021 0034   KETONESUR NEGATIVE 08/17/2021 0034   PROTEINUR 100 (A) 08/17/2021 0034   NITRITE NEGATIVE 08/17/2021 0034   LEUKOCYTESUR LARGE (A) 08/17/2021 0034     Tinsley Everman M.D. [facility] 09/01/2021, 10:06 AM  Available via Epic secure chat 7am-7pm After 7 pm, please refer to night coverage provider listed on amion.

## 2021-09-01 NOTE — Progress Notes (Signed)
SLP Cancellation Note  Patient Details Name: Barnett Elzey MRN: [id number] DOB: [birth year]   Cancelled treatment:       Reason Eval/Treat Not Completed: Other (comment). Unable to assess tolerance of current diet, as pt is NPO for IR procedure. Will continue efforts.   Kyrin Gratz B. Quentin Ore, [facility], [place] Speech Language Pathologist Office: [phone]  Shonna Chock 09/01/2021, 12:13 PM

## 2021-09-01 NOTE — Progress Notes (Signed)
Patient ID: Anthony Dudley, male   DOB: 04-Nov-1933, 87 y.o.   MRN: [id number]   Bilateral antegrade stents placed yesterday.  Nephrostomy tubes in place but capped.    Continue to follow renal function.  If renal function remains stable, will consider removal of nephrostomy tubes under fluoroscopy per IR tomorrow or Friday.  Continue urethral catheter.

## 2021-09-01 NOTE — Progress Notes (Signed)
Referring Physician(s): * No referring provider recorded for this case *  Supervising Physician: Juliet Rude  Patient Status:  [facility] - In-pt  Chief Complaint:  Patient had placement of bilateral antegrade ureter stents through existing access.  He also had replacement of bilateral nephrostomy tubes that were capped on 08/31/2021 with Dr. Anselm Pancoast.  Subjective:  Patient is a poor historian. Patient unable to answer questions due to history of dementia.  He is in no distress.  Allergies: Solanum lycopersicum [tomato]  Medications: Prior to Admission medications   Medication Sig Start Date End Date Taking? Authorizing Provider  Cholecalciferol (VITAMIN D-3) 125 MCG (5000 UT) TABS Take 5,000 Units by mouth daily.   Yes [provider]  ipratropium-albuterol (DUONEB) 0.5-2.5 (3) MG/3ML SOLN Take 3 mLs by nebulization every 4 (four) hours as needed (shortness of breath).   Yes [provider]  metoprolol tartrate (LOPRESSOR) 25 MG tablet Take 12.5 mg by mouth 2 (two) times daily.   Yes [provider]  Multiple Vitamins-Iron (MULTI-VITAMIN/IRON) TABS Take 1 tablet by mouth in the morning and at bedtime.   Yes [provider]  niacinamide 500 MG tablet Take 500 mg by mouth daily.   Yes [provider]  Nutritional Supplements (BOOST BREEZE PO) Take 1 Bottle by mouth 3 (three) times daily.   Yes [provider]  OXYGEN Inhale into the lungs See admin instructions. Put on oxygen if O2 is less than 90% every shift   Yes [provider]  sodium bicarbonate 650 MG tablet Take 650 mg by mouth daily.   Yes [provider]  tamsulosin (FLOMAX) 0.4 MG CAPS capsule Take 0.4 mg by mouth daily.   Yes [provider]  tolterodine (DETROL) 1 MG tablet Take 1 mg by mouth 2 (two) times daily.   Yes [provider]  vitamin B-12 (CYANOCOBALAMIN) 1000 MCG tablet Take 2,000 mcg by mouth daily.   Yes [provider]     Vital Signs: BP (!) 96/47   Pulse (!) 103   Temp 98.7 F (37.1 C) (Oral)   Resp (!) 25   Ht 5\' 9"  (1.753 m) Comment: unable to stand, approximate measurement lying in bed.  Wt 168 lb 6.9 oz (76.4 kg)   SpO2 94%   BMI 24.87 kg/m   Physical Exam Constitutional:      Appearance: He is ill-appearing.  HENT:     Head: Normocephalic and atraumatic.     Nose:     Comments: Core track in place Cardiovascular:     Rate and Rhythm: Tachycardia present.  Pulmonary:     Effort: Pulmonary effort is normal.  Genitourinary:    Comments: Urinary catheter in place with 600 cc blood-tinged with clots urine in gravity bag Skin:    General: Skin is warm and dry.     Comments: Bilateral nephrostomy tubes in place.  Both are capped.  Insertion site is unremarkable with sutures and StatLock in place.  There is no drainage, bleeding or redness at the sites.  There is a small amount on both dressing sites of pinkish drainage but not the dressing is saturated.  Neurological:     Mental Status: He is alert. He is disoriented.    Imaging: DG Abd 1 View  Result Date: 08/30/2021 CLINICAL DATA:  Feeding tube placement. EXAM: ABDOMEN - 1 VIEW COMPARISON:  None. FINDINGS: Small amount 10 mm Gastrografin administered through feeding tube. Feeding tube tip appears coiled in the region  of the stomach. 1 low resolution intraoperative spot views of the stomach were obtained. No fracture visible on the limited views. Total fluoroscopy time: 3 minutes 24 seconds IMPRESSION: 1. Small amount of Gastrografin identified in the stomach. Feeding tube tip coiled in the stomach. Electronically Signed   By: Ronney Asters M.D.   On: 08/30/2021 16:53   NM Bone Scan Whole Body  Result Date: 08/30/2021 CLINICAL DATA:  History of metastatic prostate cancer, staging. EXAM: NUCLEAR MEDICINE WHOLE BODY BONE SCAN TECHNIQUE: Whole body anterior and posterior images were obtained approximately 3 hours after  intravenous injection of radiopharmaceutical. RADIOPHARMACEUTICALS:  20.7 mCi Technetium-68m MDP IV COMPARISON:  CT August 19, 2021 FINDINGS: Bilateral kidneys are not visualized. There is abnormal osseous uptake throughout the axial and appendicular skeleton for instance involving the sternum, ribs, spine, pelvis and femurs. Additional abnormal multifocal radiotracer uptake involving the elbows, wrists, hands, knees, ankles and feet in a pattern most consistent with degenerative arthropathy. Photopenic defect in the distal femur and proximal tibia at the left knee likely reflects a knee arthroplasty. IMPRESSION: Scintigraphic findings consistent with extensive osteoblastic metastatic disease involving the axial and appendicular skeleton. Electronically Signed   By: Dahlia Bailiff M.D.   On: 08/30/2021 17:35   IR URETERAL STENT PLACEMENT EXISTING ACCESS LEFT  Result Date: 08/31/2021 INDICATION: 87 year old with metastatic prostate cancer and bilateral obstructive uropathy. Bilateral percutaneous nephrostomy tubes were placed on 08/19/2021. Patient also has a Foley catheter. Urology has requested conversion to antegrade ureter stents. EXAM: 1. PLACEMENT OF BILATERAL ANTEGRADE URETER STENTS THROUGH EXISTING ACCESS 2. EXCHANGE OF BILATERAL NEPHROSTOMY TUBES MEDICATIONS: Rocephin 1 g; The antibiotic was administered in an appropriate time frame prior to skin puncture. ANESTHESIA/SEDATION: Moderate (conscious) sedation was employed during this procedure. A total of Versed 1.5mg  and fentanyl 75 mcg was administered intravenously at the order of the provider performing the procedure. Total intra-service moderate sedation time: 56 minutes. Patient's level of consciousness and vital signs were monitored continuously by radiology nurse throughout the procedure under the supervision of the provider performing the procedure. CONTRAST:  50 mL Omnipaque 300-administered into the collecting system(s) FLUOROSCOPY TIME:   Fluoroscopy Time: 18 minutes, 48 seconds, 93 mGy COMPLICATIONS: None immediate. PROCEDURE: Informed consent was obtained for placement of antegrade ureter stents. Patient was placed prone on the interventional table. Bilateral nephrostomy tubes were prepped and draped in sterile fashion. Maximal barrier sterile technique was utilized including caps, mask, sterile gowns, sterile gloves, sterile drape, hand hygiene and skin antiseptic. Left nephrostomy tube was injected with contrast. Skin was anesthetized with 1% lidocaine. Catheter was cut and removed over a Bentson wire. Kumpe catheter was advanced to the distal left ureter. Contrast injection demonstrated obstruction at the left ureterovesical junction. A Roadrunner wire was successfully advanced into the urinary bladder. Catheter was advanced over the wire into the urinary bladder. The Foley catheter was clamped and contrast was injected in the bladder in order to create space around the Foley catheter balloon. Kumpe catheter was removed over a superstiff Amplatz wire. 9 French vascular sheath was placed and a Bentson wire was placed as a safety wire. An 8 [place], 22 cm ureter stent was advanced over the superstiff Amplatz wire and successfully deployed within the urinary bladder and in the left renal pelvis. 8.5 [place] multipurpose drain was advanced over the safety wire and reconstituted in the renal pelvis. Contrast was injected. The nephrostomy tube was flushed with saline and capped. Catheter was sutured to skin. Attention was directed to the  right nephrostomy tube. Nephrostomy tube was injected with contrast. Catheter was cut and removed over a Bentson wire. Kumpe catheter was advanced into the right ureter and there was an obstruction at the right ureterovesical junction. Roadrunner wire was successfully advanced into the urinary bladder and the catheter was advanced into the urinary bladder. Super stiff Amplatz wire was placed. 9 French vascular sheath  was placed over the superstiff Amplatz wire and a Bentson wire was placed as a safety wire. 8 [place], 22 cm ureter stent was advanced over the superstiff Amplatz wire and deployed within the urinary bladder and in the right kidney upper pole calyx. 8.5 French nephrostomy tube was placed over the Bentson wire and deployed in the renal pelvis. Nephrostomy tube was flushed and capped. Nephrostomy tube was sutured to skin. FINDINGS: Obstruction at the bilateral ureterovesical junctions. Catheters and wires were successfully advanced beyond the ureterovesical junction obstruction bilaterally. 8 French, 22 cm ureter stents were placed bilaterally. Left ureter stent is positioned in the renal pelvis along with an 8.5 French nephrostomy tube. The right ureter stent is coiled in the right upper pole calyx. Right nephrostomy tube is coiled in the renal pelvis. IMPRESSION: 1. Successful placement of bilateral antegrade ureter stents. 2. Replacement of bilateral nephrostomy tubes. Both nephrostomy tubes are capped. Nephrostomy tubes can be removed at a later time with fluoroscopy. Electronically Signed   By: Markus Daft M.D.   On: 08/31/2021 18:02   IR URETERAL STENT PLACEMENT EXISTING ACCESS RIGHT  Result Date: 08/31/2021 INDICATION: 87 year old with metastatic prostate cancer and bilateral obstructive uropathy. Bilateral percutaneous nephrostomy tubes were placed on 08/19/2021. Patient also has a Foley catheter. Urology has requested conversion to antegrade ureter stents. EXAM: 1. PLACEMENT OF BILATERAL ANTEGRADE URETER STENTS THROUGH EXISTING ACCESS 2. EXCHANGE OF BILATERAL NEPHROSTOMY TUBES MEDICATIONS: Rocephin 1 g; The antibiotic was administered in an appropriate time frame prior to skin puncture. ANESTHESIA/SEDATION: Moderate (conscious) sedation was employed during this procedure. A total of Versed 1.5mg  and fentanyl 75 mcg was administered intravenously at the order of the provider performing the procedure. Total  intra-service moderate sedation time: 56 minutes. Patient's level of consciousness and vital signs were monitored continuously by radiology nurse throughout the procedure under the supervision of the provider performing the procedure. CONTRAST:  50 mL Omnipaque 300-administered into the collecting system(s) FLUOROSCOPY TIME:  Fluoroscopy Time: 18 minutes, 48 seconds, 93 mGy COMPLICATIONS: None immediate. PROCEDURE: Informed consent was obtained for placement of antegrade ureter stents. Patient was placed prone on the interventional table. Bilateral nephrostomy tubes were prepped and draped in sterile fashion. Maximal barrier sterile technique was utilized including caps, mask, sterile gowns, sterile gloves, sterile drape, hand hygiene and skin antiseptic. Left nephrostomy tube was injected with contrast. Skin was anesthetized with 1% lidocaine. Catheter was cut and removed over a Bentson wire. Kumpe catheter was advanced to the distal left ureter. Contrast injection demonstrated obstruction at the left ureterovesical junction. A Roadrunner wire was successfully advanced into the urinary bladder. Catheter was advanced over the wire into the urinary bladder. The Foley catheter was clamped and contrast was injected in the bladder in order to create space around the Foley catheter balloon. Kumpe catheter was removed over a superstiff Amplatz wire. 9 French vascular sheath was placed and a Bentson wire was placed as a safety wire. An 8 [place], 22 cm ureter stent was advanced over the superstiff Amplatz wire and successfully deployed within the urinary bladder and in the left renal pelvis. 8.5 [place] multipurpose drain  was advanced over the safety wire and reconstituted in the renal pelvis. Contrast was injected. The nephrostomy tube was flushed with saline and capped. Catheter was sutured to skin. Attention was directed to the right nephrostomy tube. Nephrostomy tube was injected with contrast. Catheter was cut and  removed over a Bentson wire. Kumpe catheter was advanced into the right ureter and there was an obstruction at the right ureterovesical junction. Roadrunner wire was successfully advanced into the urinary bladder and the catheter was advanced into the urinary bladder. Super stiff Amplatz wire was placed. 9 French vascular sheath was placed over the superstiff Amplatz wire and a Bentson wire was placed as a safety wire. 8 [place], 22 cm ureter stent was advanced over the superstiff Amplatz wire and deployed within the urinary bladder and in the right kidney upper pole calyx. 8.5 French nephrostomy tube was placed over the Bentson wire and deployed in the renal pelvis. Nephrostomy tube was flushed and capped. Nephrostomy tube was sutured to skin. FINDINGS: Obstruction at the bilateral ureterovesical junctions. Catheters and wires were successfully advanced beyond the ureterovesical junction obstruction bilaterally. 8 French, 22 cm ureter stents were placed bilaterally. Left ureter stent is positioned in the renal pelvis along with an 8.5 French nephrostomy tube. The right ureter stent is coiled in the right upper pole calyx. Right nephrostomy tube is coiled in the renal pelvis. IMPRESSION: 1. Successful placement of bilateral antegrade ureter stents. 2. Replacement of bilateral nephrostomy tubes. Both nephrostomy tubes are capped. Nephrostomy tubes can be removed at a later time with fluoroscopy. Electronically Signed   By: Markus Daft M.D.   On: 08/31/2021 18:02    Labs:  CBC: Recent Labs    08/30/21 0320 08/31/21 0304 08/31/21 1724 09/01/21 0531  WBC 10.6* 9.6 10.4 10.5  HGB 7.5* 7.4* 7.5* 6.8*  HCT 24.4* 24.3* 24.6* 22.2*  PLT 88* 86* 85* 89*    COAGS: Recent Labs    08/18/21 0301 08/31/21 1724  INR 1.3* 2.0*    BMP: Recent Labs    08/29/21 0310 08/30/21 0320 08/31/21 0304 09/01/21 0531  NA 149* 150* 149* 144  K 4.1 3.9 3.7 4.0  CL 113* 112* 113* 112*  CO2 27 29 25 25   GLUCOSE 104*  101* 100* 121*  BUN 31* 31* 33* 37*  CALCIUM 8.3* 8.4* 8.5* 8.4*  CREATININE 2.87* 2.83* 3.05* 3.00*  GFRNONAA 21* 21* 19* 20*    LIVER FUNCTION TESTS: Recent Labs    08/20/21 2340 08/23/21 0313 08/24/21 0242 08/25/21 0300  BILITOT 0.9 0.7 0.4 0.8  AST 17 15 12* 11*  ALT 7 6 <5 7  ALKPHOS 269* 280* 285* 378*  PROT 6.0* 6.1* 5.8* 6.0*  ALBUMIN 2.2* 2.0* 2.0* 2.0*    Assessment and Plan:  Patient observed to be resting in bed.  He does not respond to questions and has illegible speech.  He is not in distress Bilateral nephrostomy tubes in place.  Both are capped with sutures and StatLock in place.  Insertion site is unremarkable.  Both dressings have pinkish drainage but is not saturating the dressing.  There is no redness, bleeding or other symptoms of infection at insertion sites.  Urethral catheter in place with approximately 600 cc blood-tinged urine with clots with 945 cc documented in epic over the past 24 hours.  Creatinine 3.0 today, 3.05 yesterday BUN 37 today, 33 yesterday GFR 20 today, 19 yesterday He is afebrile, tachycardic.  Other vital signs stable.  Per Dr. Anselm Pancoast, please continue  to follow renal function.  If renal function remains stable, consider removal of nephrostomy tubes under fluoroscopy.  Electronically Signed: Tyson Alias, NP 09/01/2021, 11:09 AM   I spent a total of 15 Minutes at the the patient's bedside AND on the patient's hospital floor or unit, greater than 50% of which was counseling/coordinating care for bilateral antegrade ureter stents and replacement of bilateral nephrostomy tubes.

## 2021-09-01 NOTE — Plan of Care (Signed)
  Problem: Clinical Measurements: Goal: Will remain free from infection Outcome: Progressing   Problem: Elimination: Goal: Will not experience complications related to urinary retention Outcome: Progressing   

## 2021-09-02 DIAGNOSIS — Z0189 Encounter for other specified special examinations: Secondary | ICD-10-CM | POA: Diagnosis not present

## 2021-09-02 DIAGNOSIS — Z4659 Encounter for fitting and adjustment of other gastrointestinal appliance and device: Secondary | ICD-10-CM | POA: Diagnosis not present

## 2021-09-02 DIAGNOSIS — N170 Acute kidney failure with tubular necrosis: Secondary | ICD-10-CM | POA: Diagnosis not present

## 2021-09-02 DIAGNOSIS — N179 Acute kidney failure, unspecified: Secondary | ICD-10-CM | POA: Diagnosis not present

## 2021-09-02 DIAGNOSIS — N1832 Chronic kidney disease, stage 3b: Secondary | ICD-10-CM

## 2021-09-02 LAB — BASIC METABOLIC PANEL
Anion gap: 11 (ref 5–15)
BUN: 44 mg/dL — ABNORMAL HIGH (ref 8–23)
CO2: 21 mmol/L — ABNORMAL LOW (ref 22–32)
Calcium: 8.5 mg/dL — ABNORMAL LOW (ref 8.9–10.3)
Chloride: 111 mmol/L (ref 98–111)
Creatinine, Ser: 2.55 mg/dL — ABNORMAL HIGH (ref 0.61–1.24)
GFR, Estimated: 24 mL/min — ABNORMAL LOW (ref >60)
Glucose, Bld: 96 mg/dL (ref 70–99)
Potassium: 4.4 mmol/L (ref 3.5–5.1)
Sodium: 143 mmol/L (ref 135–145)

## 2021-09-02 LAB — CBC WITH DIFFERENTIAL/PLATELET
Abs Immature Granulocytes: 0.08 10*3/uL — ABNORMAL HIGH (ref 0.00–0.07)
Basophils Absolute: 0 10*3/uL (ref 0.0–0.1)
Basophils Relative: 0 %
Eosinophils Absolute: 0.2 10*3/uL (ref 0.0–0.5)
Eosinophils Relative: 2 %
HCT: 26.5 % — ABNORMAL LOW (ref 39.0–52.0)
Hemoglobin: 8.2 g/dL — ABNORMAL LOW (ref 13.0–17.0)
Immature Granulocytes: 1 %
Lymphocytes Relative: 20 %
Lymphs Abs: 2.1 10*3/uL (ref 0.7–4.0)
MCH: 29.5 pg (ref 26.0–34.0)
MCHC: 30.9 g/dL (ref 30.0–36.0)
MCV: 95.3 fL (ref 80.0–100.0)
Monocytes Absolute: 0.5 10*3/uL (ref 0.1–1.0)
Monocytes Relative: 5 %
Neutro Abs: 7.4 10*3/uL (ref 1.7–7.7)
Neutrophils Relative %: 72 %
Platelets: 84 10*3/uL — ABNORMAL LOW (ref 150–400)
RBC: 2.78 MIL/uL — ABNORMAL LOW (ref 4.22–5.81)
RDW: 20.6 % — ABNORMAL HIGH (ref 11.5–15.5)
WBC: 10.2 10*3/uL (ref 4.0–10.5)
nRBC: 0 % (ref 0.0–0.2)

## 2021-09-02 LAB — GLUCOSE, CAPILLARY
Glucose-Capillary: 107 mg/dL — ABNORMAL HIGH (ref 70–99)
Glucose-Capillary: 82 mg/dL (ref 70–99)
Glucose-Capillary: 87 mg/dL (ref 70–99)
Glucose-Capillary: 97 mg/dL (ref 70–99)
Glucose-Capillary: 99 mg/dL (ref 70–99)
Glucose-Capillary: 110 mg/dL — ABNORMAL HIGH (ref 70–99)

## 2021-09-02 LAB — BPAM RBC
Blood Product Expiration Date: 202210202359
Blood Product Expiration Date: 202211032359
ISSUE DATE / TIME: 202210191004
ISSUE DATE / TIME: 202210191254
Unit Type and Rh: 5100
Unit Type and Rh: 5100

## 2021-09-02 LAB — TYPE AND SCREEN
ABO/RH(D): O POS
Antibody Screen: NEGATIVE
Unit division: 0

## 2021-09-02 MED ORDER — OXYBUTYNIN CHLORIDE 5 MG PO TABS
2.5000 mg | ORAL_TABLET | Freq: Two times a day (BID) | ORAL | Status: DC
  Administered 2021-09-02 – 2021-09-06 (×8): 2.5 mg
  Filled 2021-09-02 (×8): qty 1

## 2021-09-02 MED ORDER — FOLIC ACID 1 MG PO TABS
1.0000 mg | ORAL_TABLET | Freq: Every day | ORAL | Status: DC
  Administered 2021-09-02 – 2021-09-06 (×5): 1 mg
  Filled 2021-09-02 (×5): qty 1

## 2021-09-02 NOTE — Progress Notes (Signed)
[facility]                                                                              Patient Demographics  Anthony Dudley, is a 87 y.o. male, DOB - 08/29/1933, PRF:[id number]  Admit date - 08/17/2021   Admitting Physician Shela Leff, MD  Outpatient Primary MD for the patient is [name], MD  Outpatient specialists:   LOS - 15  days   Medical records reviewed and are as summarized below:    Chief Complaint  Patient presents with   Acute Renal Failure   Abnormal Lab       Brief summary   Patient is a 87 year old male with metastatic prostate CA, chronic indwelling Foley, CKD stage3, MDR, dementia, chronic bedbound status, diastolic CHF, hypertension, multiple other medical problems presented with AKI on CKD from the SNF.  Creatinine reportedly 2.5 on 8/26, 6.1 on 10/3.  He was recently transfused with hemoglobin of 6.6 at [facility] on 9/27. Patient was admitted with sepsis secondary to UTI and AKI on CKD.  Urology was consulted for hydronephrosis, underwent cystoscopy and fulguration of bladder on 10/5 with failed attempted stent placement.  IR was consulted for bilateral nephrostomy tubes, placed on 10/6.  Hospitalization was complicated by SVT, started on amiodarone.  Cardiology and PCCM were consulted.  Renal function has been gradually improving however patient remains very debilitated, poor oral intake, FTT.   Assessment & Plan    Principal Problem:   Acute-on-chronic kidney injury ([place]) stage IIIa with bilateral hydronephrosis, metabolic acidosis -Baseline creatinine reported to be 2.5, presented with creatinine of 6.1 -Underwent bilateral PCN by IR, urology and IR following.   -Underwent bilateral antegrade ureteral stents and replacement of bilateral nephrostomy tube by IR on 10/18  -Urology, IR following, plan for removing the nephrostomy tube under fluoroscopy -Renal function improving, 2.5 today  Active Problems: Sepsis  secondary to UTI, POA due to chronic indwelling Foley catheter, bilateral multifocal pneumonia -Patient presented with tachypnea, tachycardia, leukocytosis.  Prior history of MDR Proteus, was treated with cefepime, vancomycin, completed 7 days to cover both UTI and pneumonia. -Foley was exchanged in ED    Malnutrition of moderate degree, failure to thrive, poor oral intake -Patient started on core track feeding, currently tolerating tube feeds, free water -Discussed in detail with patient's brother regarding PEG tube.  -Patient has been somewhat lethargic in the last few days, this morning somewhat more alert and awake. Will repeat swallow evaluation, if fails, then will pursue PEG tube per patient's brother (HPOA).    Acute on chronic anemia of blood loss, folic acid deficiency -Multiple chronic illness, hematuria, baseline ~7.5 -Transfused 1 unit on 10/19 for hemoglobin of 6.8 -Folic acid low 3.2, will place replacement  Hypernatremia -Due to poor oral intake, FTT, started on core track feeding -Added free water 125 cc every 4 hours, Na stable  Acute hypoxemic respiratory failure -With underlying severe multilobular bilateral bronchopneumonia -2D echo showed normal EF -Negative balance of 14.4 L, O2 sats stable 98% on room air  SVT -Currently in normal sinus rhythm, initially was started on amiodarone, has been off -2D echo showed  normal EF, TSH normal  Code Status: Full CODE STATUS DVT Prophylaxis:  Place and maintain sequential compression device Start: 08/21/21 0754 SCDs Start: 08/18/21 0235   Level of Care: Level of care: Progressive Family Communication: Updated patient's brother on the phone in detail.   Disposition Plan:     Status is: Inpatient  Remains inpatient appropriate because: IV treatments appropriate due to intensity of illness, unable to take p.o., currently on core track feeding.  Plan for PEG tube if fails swallow evaluation  Time Spent in minutes 25  minutes  Procedures:  Cystoscopy Bilateral PCN Placement of bilateral antegrade ureteral stents  Consultants:   Urology PCCM Cardiology Palliative medicine IR  Antimicrobials:   Anti-infectives (From admission, onward)    Start     Dose/Rate Route Frequency Ordered Stop   08/31/21 0600  cefTRIAXone (ROCEPHIN) 1 g in sodium chloride 0.9 % 100 mL IVPB       Note to Pharmacy: To be given in IR for IR procedure tentatively scheduled for 10.18.22   1 g 200 mL/hr over 30 Minutes Intravenous To Radiology 08/30/21 1608 08/31/21 1503   08/27/21 0000  cefTRIAXone (ROCEPHIN) 2 g in sodium chloride 0.9 % 100 mL IVPB  Status:  Discontinued        2 g 200 mL/hr over 30 Minutes Intravenous To Radiology 08/26/21 1419 08/27/21 1615   08/23/21 2200  vancomycin (VANCOCIN) IVPB 1000 mg/200 mL premix        1,000 mg 200 mL/hr over 60 Minutes Intravenous  Once 08/23/21 1631 08/23/21 2256   08/22/21 1800  ceFEPIme (MAXIPIME) 2 g in sodium chloride 0.9 % 100 mL IVPB        2 g 200 mL/hr over 30 Minutes Intravenous Every 24 hours 08/22/21 0727 08/24/21 1848   08/21/21 0830  vancomycin (VANCOREADY) IVPB 1250 mg/250 mL        1,250 mg 166.7 mL/hr over 90 Minutes Intravenous  Once 08/21/21 0733 08/21/21 1842   08/20/21 1800  ceFEPIme (MAXIPIME) 1 g in sodium chloride 0.9 % 100 mL IVPB  Status:  Discontinued        1 g 200 mL/hr over 30 Minutes Intravenous Every 24 hours 08/20/21 1457 08/22/21 0727   08/19/21 1800  ceFEPIme (MAXIPIME) 2 g in sodium chloride 0.9 % 100 mL IVPB  Status:  Discontinued        2 g 200 mL/hr over 30 Minutes Intravenous Every 24 hours 08/19/21 1553 08/20/21 1457   08/19/21 1645  vancomycin (VANCOREADY) IVPB 1500 mg/300 mL        1,500 mg 150 mL/hr over 120 Minutes Intravenous  Once 08/19/21 1551 08/19/21 1911   08/19/21 1551  vancomycin variable dose per unstable renal function (pharmacist dosing)  Status:  Discontinued         Does not apply See admin instructions 08/19/21  1551 08/25/21 0758   08/19/21 1545  ceFAZolin (ANCEF) IVPB 2g/100 mL premix        2 g 200 mL/hr over 30 Minutes Intravenous To Radiology 08/19/21 1452 08/19/21 1655   08/19/21 0000  ceFEPIme (MAXIPIME) 1 g in sodium chloride 0.9 % 100 mL IVPB  Status:  Discontinued        1 g 200 mL/hr over 30 Minutes Intravenous Every 24 hours 08/18/21 0254 08/19/21 1553   08/18/21 0115  ceFEPIme (MAXIPIME) 1 g in sodium chloride 0.9 % 100 mL IVPB        1 g 200 mL/hr over 30 Minutes Intravenous  Once 08/18/21 0111 08/18/21 0453          Medications  Scheduled Meds:  sodium chloride   Intravenous Once   sodium chloride   Intravenous Once   Chlorhexidine Gluconate Cloth  6 each Topical Daily   feeding supplement (PROSource TF)  45 mL Per Tube Daily   free water  125 mL Per Tube Q4H   magnesium oxide  400 mg Oral BID   multivitamin with minerals  1 tablet Per Tube Daily   oxybutynin  5 mg Oral QHS   Continuous Infusions:  feeding supplement (OSMOLITE 1.2 CAL) 1,000 mL (09/01/21 0759)   PRN Meds:.[EXPIRED] acetaminophen **FOLLOWED BY** acetaminophen, fentaNYL (SUBLIMAZE) injection, fentaNYL, food thickener, lidocaine (PF), lidocaine (PF), LORazepam, metoprolol tartrate, midazolam      Subjective:   Anthony Dudley was seen and examined today.  Somewhat more alert and awake, able to respond in yes and no answers.  However no meaningful conversation.  No fevers or chills, no acute pain.  Objective:   Vitals:   09/02/21 0400 09/02/21 0647 09/02/21 0700 09/02/21 1200  BP: (!) 99/46  (!) 99/49 (!) 99/56  Pulse:   86 88  Resp: (!) 24  18 18   Temp:   98 F (36.7 C) 97.9 F (36.6 C)  TempSrc:   Oral Oral  SpO2:   98% 98%  Weight:  76 kg    Height:        Intake/Output Summary (Last 24 hours) at 09/02/2021 1326 Last data filed at 09/02/2021 4315 Gross per 24 hour  Intake 2067.17 ml  Output 1350 ml  Net 717.17 ml     Wt Readings from Last 3 Encounters:  09/02/21 76 kg    Physical Exam General: Alert and awake, oriented to self, able to communicate with yes and no answers however no meaningful conversation, frail and ill-appearing Cardiovascular: S1 S2 clear, RRR. No pedal edema b/l Respiratory: CTAB, no wheezing Gastrointestinal: Soft, nontender, nondistended, NBS Ext: no pedal edema bilaterally Neuro: no acute new deficits   Data Reviewed:  I have personally reviewed following labs and imaging studies  Micro Results No results found for this or any previous visit (from the past 240 hour(s)).  Radiology Reports CT ABDOMEN PELVIS WO CONTRAST  Result Date: 08/19/2021 CLINICAL DATA:  Acute renal failure.  Bilateral hydronephrosis. EXAM: CT ABDOMEN AND PELVIS WITHOUT CONTRAST TECHNIQUE: Multidetector CT imaging of the abdomen and pelvis was performed following the standard protocol without IV contrast. COMPARISON:  12/07/2020 FINDINGS: Lower chest: Trace bilateral pleural effusions. Hepatobiliary: No focal liver abnormality is seen. No gallstones, gallbladder wall thickening, or biliary dilatation. Pancreas: Unremarkable. No pancreatic ductal dilatation or surrounding inflammatory changes. Spleen: Normal in size without focal abnormality. Adrenals/Urinary Tract: Adrenal glands are unremarkable. Moderate bilateral hydronephrosis and hydroureter. 6 mm nonobstructing calculus of the lower pole of the right kidney. 8 mm nonobstructing calculus at the lower pole of the left kidney. 4 mm calculus within the dependent portion of the bladder. The bladder is collapsed around a Foley catheter balloon which limits evaluation. Stomach/Bowel: No bowel dilatation to indicate ileus or obstruction. Extensive sigmoid and descending colon diverticulosis without evidence of acute diverticulitis. Vascular/Lymphatic: Atherosclerotic calcifications seen throughout the abdominal aorta without aneurysm. No enlarged abdominal pelvic lymph nodes. Reproductive: Calcifications noted within the  prostate. Other: No abdominal wall hernia or abnormality. No abdominopelvic ascites. Musculoskeletal: Diffuse osteoblastic metastatic disease again seen. Unchanged moderate to severe compression deformity of the L1 vertebral body. IMPRESSION: Moderate bilateral hydronephrosis and hydroureter.  Bilateral nonobstructing renal calculi. Electronically Signed   By: Miachel Roux M.D.   On: 08/19/2021 13:43   DG Abd 1 View  Result Date: 08/30/2021 CLINICAL DATA:  Feeding tube placement. EXAM: ABDOMEN - 1 VIEW COMPARISON:  None. FINDINGS: Small amount 10 mm Gastrografin administered through feeding tube. Feeding tube tip appears coiled in the region of the stomach. 1 low resolution intraoperative spot views of the stomach were obtained. No fracture visible on the limited views. Total fluoroscopy time: 3 minutes 24 seconds IMPRESSION: 1. Small amount of Gastrografin identified in the stomach. Feeding tube tip coiled in the stomach. Electronically Signed   By: Ronney Asters M.D.   On: 08/30/2021 16:53   DG Abd 1 View  Result Date: 08/28/2021 CLINICAL DATA:  Shortness of breath, nausea, vomiting. EXAM: ABDOMEN - 1 VIEW COMPARISON:  CT abdomen dated 08/19/2021. FINDINGS: New bilateral percutaneous nephrostomy tubes in place. No dilated large or small bowel loops are identified. Patient's RIGHT hand obscures visualization of the RIGHT lower quadrant. Blastic lesions throughout the visualized osseous structures, corresponding to previously described metastatic disease. IMPRESSION: 1. No acute findings. Nonobstructive bowel gas pattern. 2. Bilateral percutaneous nephrostomy tubes in place. 3. Diffuse osseous metastases, as previously described. Electronically Signed   By: Franki Cabot M.D.   On: 08/28/2021 10:20   NM Bone Scan Whole Body  Result Date: 08/30/2021 CLINICAL DATA:  History of metastatic prostate cancer, staging. EXAM: NUCLEAR MEDICINE WHOLE BODY BONE SCAN TECHNIQUE: Whole body anterior and posterior  images were obtained approximately 3 hours after intravenous injection of radiopharmaceutical. RADIOPHARMACEUTICALS:  20.7 mCi Technetium-63m MDP IV COMPARISON:  CT August 19, 2021 FINDINGS: Bilateral kidneys are not visualized. There is abnormal osseous uptake throughout the axial and appendicular skeleton for instance involving the sternum, ribs, spine, pelvis and femurs. Additional abnormal multifocal radiotracer uptake involving the elbows, wrists, hands, knees, ankles and feet in a pattern most consistent with degenerative arthropathy. Photopenic defect in the distal femur and proximal tibia at the left knee likely reflects a knee arthroplasty. IMPRESSION: Scintigraphic findings consistent with extensive osteoblastic metastatic disease involving the axial and appendicular skeleton. Electronically Signed   By: Dahlia Bailiff M.D.   On: 08/30/2021 17:35   DG Cystogram  Result Date: 08/19/2021 CLINICAL DATA:  Surgery, elective. EXAM: CYSTOGRAM TECHNIQUE: A single intraprocedural fluoroscopic image from reported cystoscopy and bladder fulguration is submitted. FLUOROSCOPY TIME:  Fluoroscopy Time:  14 seconds COMPARISON:  Renal ultrasound 08/18/2021. CT abdomen/pelvis 06/06/2021. FINDINGS: A single intraprocedural fluoroscopic image of the pelvis from reported cystoscopy and bladder fulguration is submitted. On the provided image, a cystoscope projects in the region of the pelvis. Correlate with the procedural history. IMPRESSION: Single intraprocedural fluoroscopic image of the pelvis from reported cystoscopy and bladder fulguration. Please correlate with the procedural history. Electronically Signed   By: Kellie Simmering D.O.   On: 08/19/2021 09:36   US Renal  Result Date: 08/18/2021 CLINICAL DATA:  Renal failure EXAM: RENAL / URINARY TRACT ULTRASOUND COMPLETE COMPARISON:  06/06/2021 FINDINGS: Right Kidney: Renal measurements: 10.2 x 5.3 x 5.3 cm. = volume: 150 mL. Moderate hydronephrosis is noted. This has  progressed slightly in the interval from the prior CT. Previously seen right renal calculus is not well appreciated on today's exam. Left Kidney: Renal measurements: 9.8 x 5.5 x 4.5 cm. = volume: 128 mL. Moderate left-sided hydronephrosis is noted as well stable in appearance from prior CT. Previously seen renal calculus is not well appreciated on today's exam. Bladder: Foley catheter  is noted within the bladder. Other: None. IMPRESSION: Bilateral hydronephrosis left slightly greater than right similar to that seen on prior CT examination. Previously noted renal calculi bilaterally are not well appreciated on today's exam. Electronically Signed   By: Inez Catalina M.D.   On: 08/18/2021 00:30   DG CHEST PORT 1 VIEW  Result Date: 08/26/2021 CLINICAL DATA:  Shortness of breath. EXAM: PORTABLE CHEST 1 VIEW COMPARISON:  August 21, 2021 FINDINGS: Mild, bilateral multifocal infiltrates are seen. This is decreased in severity when compared to the prior study. There is no evidence of a pleural effusion or pneumothorax. The heart size and mediastinal contours are within normal limits. Moderate to marked severity calcification the aortic arch is noted. Diffusely sclerotic osseous structures are seen. IMPRESSION: Mild, bilateral multifocal infiltrates, decreased in severity when compared to the prior study. Electronically Signed   By: Virgina Norfolk M.D.   On: 08/26/2021 20:48   DG Chest Port 1 View  Result Date: 08/21/2021 CLINICAL DATA:  87 year old male with history of abnormal respiration. EXAM: PORTABLE CHEST 1 VIEW COMPARISON:  Chest x-ray 08/20/2021. FINDINGS: Lung volumes are low. Diffuse interstitial prominence and patchy ill-defined and nodular opacities are noted throughout the lungs bilaterally. Small left pleural effusion. No definite right pleural effusion. No appreciable pneumothorax. Pulmonary vasculature does not appearing origin. Heart size is normal. The patient is rotated to the left on today's  exam, resulting in distortion of the mediastinal contours and reduced diagnostic sensitivity and specificity for mediastinal pathology. Atherosclerotic calcifications in the thoracic aorta. IMPRESSION: 1. Severe multilobar bilateral bronchopneumonia again noted. 2. Small left pleural effusion. 3. Aortic atherosclerosis. Electronically Signed   By: Vinnie Langton M.D.   On: 08/21/2021 10:25   DG CHEST PORT 1 VIEW  Result Date: 08/20/2021 CLINICAL DATA:  Hypoxia, respiratory failure, confusion. EXAM: PORTABLE CHEST 1 VIEW COMPARISON:  Chest radiograph 08/19/2021, CT abdomen pelvis 08/19/2021 FINDINGS: Diffuse hazy interstitial thickening and prominence of the perihilar vasculature. Unchanged retrocardiac left lower lobe opacity. No pneumothorax. Stable cardiomediastinal silhouette. Aortic calcifications. Numerous sclerotic osseous lesions, particularly within thoracolumbar spine, better appreciated on yesterday's cross-sectional imaging. IMPRESSION: Mild pulmonary edema. Retrocardiac left lower lobe opacity may represent combination of pleural fluid and atelectasis, though pneumonia is not excluded. Electronically Signed   By: Ileana Roup M.D.   On: 08/20/2021 09:32   DG CHEST PORT 1 VIEW  Result Date: 08/19/2021 CLINICAL DATA:  Hypoxia. EXAM: PORTABLE CHEST 1 VIEW COMPARISON:  08/17/2021 FINDINGS: The cardiomediastinal silhouette is unchanged with normal heart size. Aortic atherosclerosis is noted. Lung volumes are low there are increasing patchy airspace opacities throughout the left lung with milder airspace opacities on the right. There is persistent elevation of the left hemidiaphragm. No large pleural effusion or pneumothorax is identified although the patient's chin partially obscures the lung apices. Widespread sclerotic bone metastases are again noted. IMPRESSION: Increasing left greater than right lung airspace opacities concerning for pneumonia. Electronically Signed   By: Logan Bores M.D.   On:  08/19/2021 14:58   DG Chest Port 1 View  Result Date: 08/17/2021 CLINICAL DATA:  Shortness of breath EXAM: PORTABLE CHEST 1 VIEW COMPARISON:  06/06/2021, CT 06/06/2021 FINDINGS: Chronic scarring in the left upper lobe. No acute airspace disease or pleural effusion. Stable cardiomediastinal silhouette with aortic atherosclerosis. Patchy bilateral skeletal sclerosis consistent with osseous metastatic disease. IMPRESSION: 1. Scarring in the left upper lobe.  No acute airspace disease. 2. Diffuse rib sclerosis consistent with skeletal metastatic disease Electronically Signed  By: Donavan Foil M.D.   On: 08/17/2021 23:41   ECHOCARDIOGRAM COMPLETE  Result Date: 08/21/2021    ECHOCARDIOGRAM REPORT   Patient Name:   Anthony Dudley Date of Exam: 08/21/2021 Medical Rec #:  [id number]      Height:       69.0 in Accession #:    [id number]     Weight:       180.8 lb Date of Birth:  20-Jan-1933      BSA:          1.979 m Patient Age:    55 years       BP:           122/78 mmHg Patient Gender: M              HR:           110 bpm. Exam Location:  Inpatient Procedure: 2D Echo, Color Doppler and Cardiac Doppler Indications:    Ventricular Tachycardia I47.2  History:        Patient has no prior history of Echocardiogram examinations.  Sonographer:    Bernadene Person RDCS Referring Phys: [id number] [place] Comments: Image acquisition challenging due to patient behavioral factors. and Image acquisition challenging due to uncooperative patient. IMPRESSIONS  1. Left ventricular ejection fraction, by estimation, is 55 to 60%. The left ventricle has normal function. The left ventricle has no regional wall motion abnormalities. There is mild left ventricular hypertrophy. Left ventricular diastolic parameters are indeterminate.  2. Right ventricular systolic function was not well visualized. The right ventricular size is not well visualized. There is normal pulmonary artery systolic pressure.  3. The mitral valve  is normal in structure. Trivial mitral valve regurgitation. No evidence of mitral stenosis.  4. The aortic valve was not well visualized. Aortic valve regurgitation is not visualized. Mild to moderate aortic valve sclerosis/calcification is present, without any evidence of aortic stenosis. FINDINGS  Left Ventricle: Left ventricular ejection fraction, by estimation, is 55 to 60%. The left ventricle has normal function. The left ventricle has no regional wall motion abnormalities. The left ventricular internal cavity size was normal in size. There is  mild left ventricular hypertrophy. Left ventricular diastolic parameters are indeterminate. Right Ventricle: The right ventricular size is not well visualized. Right vetricular wall thickness was not well visualized. Right ventricular systolic function was not well visualized. There is normal pulmonary artery systolic pressure. The tricuspid regurgitant velocity is 2.36 m/s, and with an assumed right atrial pressure of 3 mmHg, the estimated right ventricular systolic pressure is 01.6 mmHg. Left Atrium: Left atrial size was normal in size. Right Atrium: Right atrial size was normal in size. Pericardium: There is no evidence of pericardial effusion. Mitral Valve: The mitral valve is normal in structure. Trivial mitral valve regurgitation. No evidence of mitral valve stenosis. Tricuspid Valve: The tricuspid valve is normal in structure. Tricuspid valve regurgitation is trivial. Aortic Valve: The aortic valve was not well visualized. Aortic valve regurgitation is not visualized. Mild to moderate aortic valve sclerosis/calcification is present, without any evidence of aortic stenosis. Pulmonic Valve: The pulmonic valve was not well visualized. Pulmonic valve regurgitation is not visualized. Aorta: The aortic root and ascending aorta are structurally normal, with no evidence of dilitation. IAS/Shunts: The interatrial septum was not well visualized.  LEFT VENTRICLE PLAX 2D  LVIDd:         3.60 cm LVIDs:         2.60 cm LV PW:  0.90 cm LV IVS:        1.10 cm LVOT diam:     2.20 cm LV SV:         71 LV SV Index:   36 LVOT Area:     3.80 cm  RIGHT VENTRICLE TAPSE (M-mode): 1.6 cm LEFT ATRIUM             Index        RIGHT ATRIUM           Index LA diam:        3.10 cm 1.57 cm/m   RA Area:     11.30 cm LA Vol (A2C):   30.6 ml 15.46 ml/m  RA Volume:   22.60 ml  11.42 ml/m LA Vol (A4C):   31.3 ml 15.81 ml/m LA Biplane Vol: 31.1 ml 15.71 ml/m  AORTIC VALVE LVOT Vmax:   111.00 cm/s LVOT Vmean:  71.000 cm/s LVOT VTI:    0.187 m  AORTA Ao Asc diam: 3.50 cm TRICUSPID VALVE TR Peak grad:   22.3 mmHg TR Vmax:        236.00 cm/s  SHUNTS Systemic VTI:  0.19 m Systemic Diam: 2.20 cm Dani Gobble Croitoru MD Electronically signed by Sanda Klein MD Signature Date/Time: 08/21/2021/1:58:30 PM    Final    IR NEPHROSTOMY PLACEMENT LEFT  Result Date: 08/20/2021 INDICATION: History of metastatic prostate cancer, now with bilateral obstructive uropathy. Please perform placement of bilateral nephrostomy catheters for urinary diversion purposes. EXAM: ULTRASOUND AND FLUOROSCOPIC GUIDED PLACEMENT OF BILATERAL NEPHROSTOMY TUBES COMPARISON:  CT abdomen pelvis-08/19/2021 MEDICATIONS: Patient is currently admitted to the hospital receiving intravenous antibiotics.; The antibiotic was administered in an appropriate time frame prior to skin puncture. ANESTHESIA/SEDATION: Moderate (conscious) sedation was employed during this procedure, administered by the interventional radiology RN. A total of Versed 0.5 mg was administered intravenously. Moderate Sedation Time: 25 minutes. The patient's level of consciousness and vital signs were monitored continuously by radiology nursing throughout the procedure under my direct supervision. CONTRAST:  40 mL Omnipaque 350-administered into both renal collecting systems. FLUOROSCOPY TIME:  1 minute, 48 seconds (1.3 mGy) COMPLICATIONS: None immediate. PROCEDURE: The  procedure, risks, benefits, and alternatives were explained to the the patient's family, questions were encouraged and answered and informed consent was obtained. A timeout was performed prior to the initiation of the procedure. The operative sites were prepped and draped in the usual sterile fashion and a sterile drape was applied covering the operative field. A sterile gown and sterile gloves were used for the procedure. Local anesthesia was provided with 1% Lidocaine with epinephrine. Beginning with the left kidney, ultrasound was used to localize the left kidney. Under direct ultrasound guidance, a 20 gauge needle was advanced into the renal collecting system. An ultrasound image documentation was performed. Access within the collecting system was confirmed with the efflux of urine followed by limited contrast injection. Under intermittent fluoroscopic guidance, an 0.018 wire was advanced into the collecting system and the tract was dilated with an Accustick stent. Next, over a short Amplatz wire, the track was further dilated ultimately allowing placement of a 10-French percutaneous nephrostomy catheter with end coiled and locked within the renal pelvis. Contrast was injected and several spot fluoroscopic images were obtained in various obliquities. The contralateral procedure was repeated for the right-sided nephrostomy catheter however note, an upper pole access was acquired secondary to angulation of the right kidney with the inferior pole being located deep and medial with poor sonographic visualization, accentuated due  to patient's medical instability and inability to lie prone on the fluoroscopy table. Ultimately, under ultrasound fluoroscopic guidance, a 10 French nephrostomy catheter was placed via a posterosuperior calyx with end coiled and locked within the right renal pelvis. Contrast was injected several spot fluoroscopic images were obtained in various obliquities Both catheters were secured at the  skin entrance site within interrupted sutures and StatLock devices. Both nephrostomies were connected to gravity bags. Dressings were applied. The patient tolerated procedure well without immediate postprocedural complication. FINDINGS: Ultrasound scanning demonstrates a moderate dilated bilateral collecting systems. Under a combination of ultrasound and fluoroscopic guidance, a left-sided posterior inferior calix was targeted allowing placement of a 10-French percutaneous nephrostomy catheter with end coiled and locked within the renal pelvis. Contrast injection confirmed appropriate positioning. Under a combination of ultrasound and fluoroscopic guidance, a right-sided posterosuperior calyx was targeted (an inferior calyx was unable to be accessed secondary to angulation of the right kidney as well as patient's somewhat unstable status and inability to lie on the fluoroscopy table) allowing placement of a 10 French percutaneous nephrostomy catheter with end coiled and locked within the renal pelvis. Contrast injection confirmed appropriate positioning. IMPRESSION: Successful ultrasound and fluoroscopic guided placement of bilateral 10 French percutaneous nephrostomy catheters. Note, as above, the left-sided nephrostomy catheter is via a posteroinferior calyx while the right-sided nephrostomy catheter is via a posterosuperior caliceal access. Electronically Signed   By: Sandi Mariscal M.D.   On: 08/20/2021 12:43   IR NEPHROSTOMY PLACEMENT RIGHT  Result Date: 08/20/2021 INDICATION: History of metastatic prostate cancer, now with bilateral obstructive uropathy. Please perform placement of bilateral nephrostomy catheters for urinary diversion purposes. EXAM: ULTRASOUND AND FLUOROSCOPIC GUIDED PLACEMENT OF BILATERAL NEPHROSTOMY TUBES COMPARISON:  CT abdomen pelvis-08/19/2021 MEDICATIONS: Patient is currently admitted to the hospital receiving intravenous antibiotics.; The antibiotic was administered in an appropriate  time frame prior to skin puncture. ANESTHESIA/SEDATION: Moderate (conscious) sedation was employed during this procedure, administered by the interventional radiology RN. A total of Versed 0.5 mg was administered intravenously. Moderate Sedation Time: 25 minutes. The patient's level of consciousness and vital signs were monitored continuously by radiology nursing throughout the procedure under my direct supervision. CONTRAST:  40 mL Omnipaque 350-administered into both renal collecting systems. FLUOROSCOPY TIME:  1 minute, 48 seconds (1.3 mGy) COMPLICATIONS: None immediate. PROCEDURE: The procedure, risks, benefits, and alternatives were explained to the the patient's family, questions were encouraged and answered and informed consent was obtained. A timeout was performed prior to the initiation of the procedure. The operative sites were prepped and draped in the usual sterile fashion and a sterile drape was applied covering the operative field. A sterile gown and sterile gloves were used for the procedure. Local anesthesia was provided with 1% Lidocaine with epinephrine. Beginning with the left kidney, ultrasound was used to localize the left kidney. Under direct ultrasound guidance, a 20 gauge needle was advanced into the renal collecting system. An ultrasound image documentation was performed. Access within the collecting system was confirmed with the efflux of urine followed by limited contrast injection. Under intermittent fluoroscopic guidance, an 0.018 wire was advanced into the collecting system and the tract was dilated with an Accustick stent. Next, over a short Amplatz wire, the track was further dilated ultimately allowing placement of a 10-French percutaneous nephrostomy catheter with end coiled and locked within the renal pelvis. Contrast was injected and several spot fluoroscopic images were obtained in various obliquities. The contralateral procedure was repeated for the right-sided nephrostomy  catheter however  note, an upper pole access was acquired secondary to angulation of the right kidney with the inferior pole being located deep and medial with poor sonographic visualization, accentuated due to patient's medical instability and inability to lie prone on the fluoroscopy table. Ultimately, under ultrasound fluoroscopic guidance, a 10 French nephrostomy catheter was placed via a posterosuperior calyx with end coiled and locked within the right renal pelvis. Contrast was injected several spot fluoroscopic images were obtained in various obliquities Both catheters were secured at the skin entrance site within interrupted sutures and StatLock devices. Both nephrostomies were connected to gravity bags. Dressings were applied. The patient tolerated procedure well without immediate postprocedural complication. FINDINGS: Ultrasound scanning demonstrates a moderate dilated bilateral collecting systems. Under a combination of ultrasound and fluoroscopic guidance, a left-sided posterior inferior calix was targeted allowing placement of a 10-French percutaneous nephrostomy catheter with end coiled and locked within the renal pelvis. Contrast injection confirmed appropriate positioning. Under a combination of ultrasound and fluoroscopic guidance, a right-sided posterosuperior calyx was targeted (an inferior calyx was unable to be accessed secondary to angulation of the right kidney as well as patient's somewhat unstable status and inability to lie on the fluoroscopy table) allowing placement of a 10 French percutaneous nephrostomy catheter with end coiled and locked within the renal pelvis. Contrast injection confirmed appropriate positioning. IMPRESSION: Successful ultrasound and fluoroscopic guided placement of bilateral 10 French percutaneous nephrostomy catheters. Note, as above, the left-sided nephrostomy catheter is via a posteroinferior calyx while the right-sided nephrostomy catheter is via a  posterosuperior caliceal access. Electronically Signed   By: Sandi Mariscal M.D.   On: 08/20/2021 12:43   IR URETERAL STENT PLACEMENT EXISTING ACCESS LEFT  Result Date: 08/31/2021 INDICATION: 87 year old with metastatic prostate cancer and bilateral obstructive uropathy. Bilateral percutaneous nephrostomy tubes were placed on 08/19/2021. Patient also has a Foley catheter. Urology has requested conversion to antegrade ureter stents. EXAM: 1. PLACEMENT OF BILATERAL ANTEGRADE URETER STENTS THROUGH EXISTING ACCESS 2. EXCHANGE OF BILATERAL NEPHROSTOMY TUBES MEDICATIONS: Rocephin 1 g; The antibiotic was administered in an appropriate time frame prior to skin puncture. ANESTHESIA/SEDATION: Moderate (conscious) sedation was employed during this procedure. A total of Versed 1.5mg  and fentanyl 75 mcg was administered intravenously at the order of the provider performing the procedure. Total intra-service moderate sedation time: 56 minutes. Patient's level of consciousness and vital signs were monitored continuously by radiology nurse throughout the procedure under the supervision of the provider performing the procedure. CONTRAST:  50 mL Omnipaque 300-administered into the collecting system(s) FLUOROSCOPY TIME:  Fluoroscopy Time: 18 minutes, 48 seconds, 93 mGy COMPLICATIONS: None immediate. PROCEDURE: Informed consent was obtained for placement of antegrade ureter stents. Patient was placed prone on the interventional table. Bilateral nephrostomy tubes were prepped and draped in sterile fashion. Maximal barrier sterile technique was utilized including caps, mask, sterile gowns, sterile gloves, sterile drape, hand hygiene and skin antiseptic. Left nephrostomy tube was injected with contrast. Skin was anesthetized with 1% lidocaine. Catheter was cut and removed over a Bentson wire. Kumpe catheter was advanced to the distal left ureter. Contrast injection demonstrated obstruction at the left ureterovesical junction. A  Roadrunner wire was successfully advanced into the urinary bladder. Catheter was advanced over the wire into the urinary bladder. The Foley catheter was clamped and contrast was injected in the bladder in order to create space around the Foley catheter balloon. Kumpe catheter was removed over a superstiff Amplatz wire. 9 French vascular sheath was placed and a Bentson wire was placed as a  safety wire. An 8 [place], 22 cm ureter stent was advanced over the superstiff Amplatz wire and successfully deployed within the urinary bladder and in the left renal pelvis. 8.5 [place] multipurpose drain was advanced over the safety wire and reconstituted in the renal pelvis. Contrast was injected. The nephrostomy tube was flushed with saline and capped. Catheter was sutured to skin. Attention was directed to the right nephrostomy tube. Nephrostomy tube was injected with contrast. Catheter was cut and removed over a Bentson wire. Kumpe catheter was advanced into the right ureter and there was an obstruction at the right ureterovesical junction. Roadrunner wire was successfully advanced into the urinary bladder and the catheter was advanced into the urinary bladder. Super stiff Amplatz wire was placed. 9 French vascular sheath was placed over the superstiff Amplatz wire and a Bentson wire was placed as a safety wire. 8 [place], 22 cm ureter stent was advanced over the superstiff Amplatz wire and deployed within the urinary bladder and in the right kidney upper pole calyx. 8.5 French nephrostomy tube was placed over the Bentson wire and deployed in the renal pelvis. Nephrostomy tube was flushed and capped. Nephrostomy tube was sutured to skin. FINDINGS: Obstruction at the bilateral ureterovesical junctions. Catheters and wires were successfully advanced beyond the ureterovesical junction obstruction bilaterally. 8 French, 22 cm ureter stents were placed bilaterally. Left ureter stent is positioned in the renal pelvis along with an 8.5  French nephrostomy tube. The right ureter stent is coiled in the right upper pole calyx. Right nephrostomy tube is coiled in the renal pelvis. IMPRESSION: 1. Successful placement of bilateral antegrade ureter stents. 2. Replacement of bilateral nephrostomy tubes. Both nephrostomy tubes are capped. Nephrostomy tubes can be removed at a later time with fluoroscopy. Electronically Signed   By: Markus Daft M.D.   On: 08/31/2021 18:02   IR URETERAL STENT PLACEMENT EXISTING ACCESS RIGHT  Result Date: 08/31/2021 INDICATION: 87 year old with metastatic prostate cancer and bilateral obstructive uropathy. Bilateral percutaneous nephrostomy tubes were placed on 08/19/2021. Patient also has a Foley catheter. Urology has requested conversion to antegrade ureter stents. EXAM: 1. PLACEMENT OF BILATERAL ANTEGRADE URETER STENTS THROUGH EXISTING ACCESS 2. EXCHANGE OF BILATERAL NEPHROSTOMY TUBES MEDICATIONS: Rocephin 1 g; The antibiotic was administered in an appropriate time frame prior to skin puncture. ANESTHESIA/SEDATION: Moderate (conscious) sedation was employed during this procedure. A total of Versed 1.5mg  and fentanyl 75 mcg was administered intravenously at the order of the provider performing the procedure. Total intra-service moderate sedation time: 56 minutes. Patient's level of consciousness and vital signs were monitored continuously by radiology nurse throughout the procedure under the supervision of the provider performing the procedure. CONTRAST:  50 mL Omnipaque 300-administered into the collecting system(s) FLUOROSCOPY TIME:  Fluoroscopy Time: 18 minutes, 48 seconds, 93 mGy COMPLICATIONS: None immediate. PROCEDURE: Informed consent was obtained for placement of antegrade ureter stents. Patient was placed prone on the interventional table. Bilateral nephrostomy tubes were prepped and draped in sterile fashion. Maximal barrier sterile technique was utilized including caps, mask, sterile gowns, sterile gloves,  sterile drape, hand hygiene and skin antiseptic. Left nephrostomy tube was injected with contrast. Skin was anesthetized with 1% lidocaine. Catheter was cut and removed over a Bentson wire. Kumpe catheter was advanced to the distal left ureter. Contrast injection demonstrated obstruction at the left ureterovesical junction. A Roadrunner wire was successfully advanced into the urinary bladder. Catheter was advanced over the wire into the urinary bladder. The Foley catheter was clamped and contrast was injected in the  bladder in order to create space around the Foley catheter balloon. Kumpe catheter was removed over a superstiff Amplatz wire. 9 French vascular sheath was placed and a Bentson wire was placed as a safety wire. An 8 [place], 22 cm ureter stent was advanced over the superstiff Amplatz wire and successfully deployed within the urinary bladder and in the left renal pelvis. 8.5 [place] multipurpose drain was advanced over the safety wire and reconstituted in the renal pelvis. Contrast was injected. The nephrostomy tube was flushed with saline and capped. Catheter was sutured to skin. Attention was directed to the right nephrostomy tube. Nephrostomy tube was injected with contrast. Catheter was cut and removed over a Bentson wire. Kumpe catheter was advanced into the right ureter and there was an obstruction at the right ureterovesical junction. Roadrunner wire was successfully advanced into the urinary bladder and the catheter was advanced into the urinary bladder. Super stiff Amplatz wire was placed. 9 French vascular sheath was placed over the superstiff Amplatz wire and a Bentson wire was placed as a safety wire. 8 [place], 22 cm ureter stent was advanced over the superstiff Amplatz wire and deployed within the urinary bladder and in the right kidney upper pole calyx. 8.5 French nephrostomy tube was placed over the Bentson wire and deployed in the renal pelvis. Nephrostomy tube was flushed and capped.  Nephrostomy tube was sutured to skin. FINDINGS: Obstruction at the bilateral ureterovesical junctions. Catheters and wires were successfully advanced beyond the ureterovesical junction obstruction bilaterally. 8 French, 22 cm ureter stents were placed bilaterally. Left ureter stent is positioned in the renal pelvis along with an 8.5 French nephrostomy tube. The right ureter stent is coiled in the right upper pole calyx. Right nephrostomy tube is coiled in the renal pelvis. IMPRESSION: 1. Successful placement of bilateral antegrade ureter stents. 2. Replacement of bilateral nephrostomy tubes. Both nephrostomy tubes are capped. Nephrostomy tubes can be removed at a later time with fluoroscopy. Electronically Signed   By: Markus Daft M.D.   On: 08/31/2021 18:02    Lab Data:  CBC: Recent Labs  Lab 08/29/21 0310 08/30/21 0320 08/31/21 0304 08/31/21 1724 09/01/21 0531 09/01/21 1501 09/02/21 0207  WBC 9.0 10.6* 9.6 10.4 10.5  --  10.2  NEUTROABS 5.8 7.7 7.0  --  7.9*  --  7.4  HGB 7.2* 7.5* 7.4* 7.5* 6.8* 8.3* 8.2*  HCT 23.7* 24.4* 24.3* 24.6* 22.2* 25.5* 26.5*  MCV 99.2 100.0 99.6 100.4* 100.9*  --  95.3  PLT 84* 88* 86* 85* 89*  --  84*   Basic Metabolic Panel: Recent Labs  Lab 08/28/21 1626 08/29/21 0310 08/30/21 0320 08/31/21 0304 08/31/21 1724 09/01/21 0531 09/01/21 1706 09/02/21 0207  NA  --  149* 150* 149*  --  144  --  143  K  --  4.1 3.9 3.7  --  4.0  --  4.4  CL  --  113* 112* 113*  --  112*  --  111  CO2  --  27 29 25   --  25  --  21*  GLUCOSE  --  104* 101* 100*  --  121*  --  96  BUN  --  31* 31* 33*  --  37*  --  44*  CREATININE  --  2.87* 2.83* 3.05*  --  3.00*  --  2.55*  CALCIUM  --  8.3* 8.4* 8.5*  --  8.4*  --  8.5*  MG 1.9  --   --  1.8  1.8 2.0 1.9  --   PHOS 3.0  --   --  3.0 3.0 2.4* 2.8  --    GFR: Estimated Creatinine Clearance: 20.8 mL/min (A) (by C-G formula based on SCr of 2.55 mg/dL (H)). Liver Function Tests: No results for input(s): AST, ALT,  ALKPHOS, BILITOT, PROT, ALBUMIN in the last 168 hours. No results for input(s): LIPASE, AMYLASE in the last 168 hours. No results for input(s): AMMONIA in the last 168 hours. Coagulation Profile: Recent Labs  Lab 08/31/21 1724  INR 2.0*   Cardiac Enzymes: No results for input(s): CKTOTAL, CKMB, CKMBINDEX, TROPONINI in the last 168 hours. BNP (last 3 results) No results for input(s): PROBNP in the last 8760 hours. HbA1C: No results for input(s): HGBA1C in the last 72 hours. CBG: Recent Labs  Lab 09/01/21 1937 09/01/21 2321 09/02/21 0347 09/02/21 0731 09/02/21 1051  GLUCAP 120* 101* 107* 82 87   Lipid Profile: No results for input(s): CHOL, HDL, LDLCALC, TRIG, CHOLHDL, LDLDIRECT in the last 72 hours. Thyroid Function Tests: No results for input(s): TSH, T4TOTAL, FREET4, T3FREE, THYROIDAB in the last 72 hours. Anemia Panel: Recent Labs    09/01/21 0815  VITAMINB12 1,096*  FOLATE 3.2*  FERRITIN 1,063*  TIBC 150*  IRON 42*  RETICCTPCT 1.4   Urine analysis:    Component Value Date/Time   COLORURINE YELLOW 08/17/2021 0034   APPEARANCEUR CLOUDY (A) 08/17/2021 0034   LABSPEC 1.009 08/17/2021 0034   PHURINE 6.0 08/17/2021 0034   GLUCOSEU NEGATIVE 08/17/2021 0034   HGBUR LARGE (A) 08/17/2021 0034   BILIRUBINUR NEGATIVE 08/17/2021 0034   KETONESUR NEGATIVE 08/17/2021 0034   PROTEINUR 100 (A) 08/17/2021 0034   NITRITE NEGATIVE 08/17/2021 0034   LEUKOCYTESUR LARGE (A) 08/17/2021 0034     Anthony Dudley M.D. [facility] 09/02/2021, 1:26 PM  Available via Epic secure chat 7am-7pm After 7 pm, please refer to night coverage provider listed on amion.

## 2021-09-02 NOTE — Progress Notes (Signed)
Speech Language Pathology Treatment: Dysphagia  Patient Details Name: Anthony Dudley MRN: [id number] DOB: June 05, 1933 Today's Date: 09/02/2021 Time: 9476-5465 SLP Time Calculation (min) (ACUTE ONLY): 21 min  Assessment / Plan / Recommendation Clinical Impression  Pt seen for swallowing re-assessment/treatment with mod-max verbal/tactile cues provided during aggressive oral care to clear dried secretions/moisten oral cavity/lips/oral mucosa prior to limited consistencies provided d/t decreased mentation.  Pt consumed ice chips, tsp amounts of thin liquids with delayed cough noted likely d/t cognitive deficits noted during session.  Pt with delayed initiation of the swallow, oral holding and decreased oral propulsion.  Pt only consumed 5 swallows and deferred remainder of presentations including honey-thickened liquids/nectar-thickened liquids.  Pt currently has Cortrak in place for nutrition/hydration purposes.  ST will continue to f/u for dysphagia tx/management with recommendation for Dysphagia 1/honey-thickened liquids until mentation improves for safety.  May consider non-oral nutrition/hydration if AMS persists.    HPI HPI: 87yo male admitted from nursing home 08/17/21 with worsening kidney function. PMH: prostate cancer, CKD3, chronic indwelling Foley catheter, MDR UTI, dementia, bedbound at baseline, dCHF, HTN, HLD, Cvid (04/2019). CXR = no acute airspace disease      SLP Plan  Continue with current plan of care      Recommendations for follow up therapy are one component of a multi-disciplinary discharge planning process, led by the attending physician.  Recommendations may be updated based on patient status, additional functional criteria and insurance authorization.    Recommendations  Diet recommendations: Thin liquid;Honey-thick liquid;Dysphagia 1 (puree) Liquids provided via: Cup;Straw;Teaspoon Medication Administration: Crushed with puree Supervision: Full supervision/cueing for  compensatory strategies Compensations: Slow rate;Small sips/bites Postural Changes and/or Swallow Maneuvers: Seated upright 90 degrees                Oral Care Recommendations: Oral care QID;Oral care prior to ice chip/H20 Follow up Recommendations: 24 hour supervision/assistance SLP Visit Diagnosis: Dysphagia, unspecified (R13.10) Plan: Continue with current plan of care                      Elvina Sidle, M.S.,CCC-SLP  09/02/2021, 11:46 AM

## 2021-09-02 NOTE — Progress Notes (Signed)
Patient ID: Anthony Dudley, male   DOB: 14-Jan-1933, 87 y.o.   MRN: [id number]  Cr continues to improve even after stents placed (2.6 which is consistent with baseline renal function) and UOP appears adequate.  Would be ok for IR to remove nephrostomy tubes today or tomorrow under fluoroscopy and then patient would be stable from discharge from a urologic standpoint.  Will plan to update his brother.

## 2021-09-02 NOTE — Progress Notes (Signed)
Referring Physician(s): * No referring provider recorded for this case *  Supervising Physician: Juliet Rude  Patient Status:  [facility] - In-pt  Chief Complaint:  Patient had placement of bilateral antegrade ureter stents through existing acces with replacement of bilateral nephrostomy tubes that were capped on 08/31/2021 with Dr. Anselm Pancoast.    Subjective:  Patient is a poor historian. Patient unable to answer questions due to history of dementia.  He is in no distress.  Allergies: Solanum lycopersicum [tomato]  Medications: Prior to Admission medications   Medication Sig Start Date End Date Taking? Authorizing Provider  Cholecalciferol (VITAMIN D-3) 125 MCG (5000 UT) TABS Take 5,000 Units by mouth daily.   Yes [provider]  ipratropium-albuterol (DUONEB) 0.5-2.5 (3) MG/3ML SOLN Take 3 mLs by nebulization every 4 (four) hours as needed (shortness of breath).   Yes [provider]  metoprolol tartrate (LOPRESSOR) 25 MG tablet Take 12.5 mg by mouth 2 (two) times daily.   Yes [provider]  Multiple Vitamins-Iron (MULTI-VITAMIN/IRON) TABS Take 1 tablet by mouth in the morning and at bedtime.   Yes [provider]  niacinamide 500 MG tablet Take 500 mg by mouth daily.   Yes [provider]  Nutritional Supplements (BOOST BREEZE PO) Take 1 Bottle by mouth 3 (three) times daily.   Yes [provider]  OXYGEN Inhale into the lungs See admin instructions. Put on oxygen if O2 is less than 90% every shift   Yes [provider]  sodium bicarbonate 650 MG tablet Take 650 mg by mouth daily.   Yes [provider]  tamsulosin (FLOMAX) 0.4 MG CAPS capsule Take 0.4 mg by mouth daily.   Yes [provider]  tolterodine (DETROL) 1 MG tablet Take 1 mg by mouth 2 (two) times daily.   Yes [provider]  vitamin B-12 (CYANOCOBALAMIN) 1000 MCG tablet Take 2,000 mcg by mouth daily.   Yes [provider]      Vital Signs: BP (!) 99/49   Pulse 86   Temp 98 F (36.7 C) (Oral)   Resp 18   Ht 5\' 9"  (1.753 m) Comment: unable to stand, approximate measurement lying in bed.  Wt 167 lb 8.8 oz (76 kg)   SpO2 98%   BMI 24.74 kg/m   Physical Exam Constitutional:      Appearance: He is ill-appearing.  HENT:     Head: Normocephalic and atraumatic.     Nose:     Comments: Coretrak in place Cardiovascular:     Rate and Rhythm: Normal rate.  Pulmonary:     Effort: Pulmonary effort is normal.  Genitourinary:    Comments: Urethral catheter in place with ~400 cc blood-tinged urine in bag Skin:    General: Skin is warm and dry.     Comments: Bilateral nephrostomy tubes in place.  Both are capped.  Insertion site is unremarkable with sutures and StatLock in place.  There is no drainage, bleeding or redness at the sites.  There is a small amount on both dressing sites of yellowish draiange but not the dressing is saturated.   Neurological:     Mental Status: He is disoriented.    Imaging: DG Abd 1 View  Result Date: 08/30/2021 CLINICAL DATA:  Feeding tube placement. EXAM: ABDOMEN - 1 VIEW COMPARISON:  None. FINDINGS: Small amount 10 mm Gastrografin administered through feeding tube. Feeding tube tip appears coiled in the region of the stomach. 1 low resolution intraoperative spot views  of the stomach were obtained. No fracture visible on the limited views. Total fluoroscopy time: 3 minutes 24 seconds IMPRESSION: 1. Small amount of Gastrografin identified in the stomach. Feeding tube tip coiled in the stomach. Electronically Signed   By: Ronney Asters M.D.   On: 08/30/2021 16:53   NM Bone Scan Whole Body  Result Date: 08/30/2021 CLINICAL DATA:  History of metastatic prostate cancer, staging. EXAM: NUCLEAR MEDICINE WHOLE BODY BONE SCAN TECHNIQUE: Whole body anterior and posterior images were obtained approximately 3 hours after intravenous injection of radiopharmaceutical. RADIOPHARMACEUTICALS:   20.7 mCi Technetium-18m MDP IV COMPARISON:  CT August 19, 2021 FINDINGS: Bilateral kidneys are not visualized. There is abnormal osseous uptake throughout the axial and appendicular skeleton for instance involving the sternum, ribs, spine, pelvis and femurs. Additional abnormal multifocal radiotracer uptake involving the elbows, wrists, hands, knees, ankles and feet in a pattern most consistent with degenerative arthropathy. Photopenic defect in the distal femur and proximal tibia at the left knee likely reflects a knee arthroplasty. IMPRESSION: Scintigraphic findings consistent with extensive osteoblastic metastatic disease involving the axial and appendicular skeleton. Electronically Signed   By: Dahlia Bailiff M.D.   On: 08/30/2021 17:35   IR URETERAL STENT PLACEMENT EXISTING ACCESS LEFT  Result Date: 08/31/2021 INDICATION: 87 year old with metastatic prostate cancer and bilateral obstructive uropathy. Bilateral percutaneous nephrostomy tubes were placed on 08/19/2021. Patient also has a Foley catheter. Urology has requested conversion to antegrade ureter stents. EXAM: 1. PLACEMENT OF BILATERAL ANTEGRADE URETER STENTS THROUGH EXISTING ACCESS 2. EXCHANGE OF BILATERAL NEPHROSTOMY TUBES MEDICATIONS: Rocephin 1 g; The antibiotic was administered in an appropriate time frame prior to skin puncture. ANESTHESIA/SEDATION: Moderate (conscious) sedation was employed during this procedure. A total of Versed 1.5mg  and fentanyl 75 mcg was administered intravenously at the order of the provider performing the procedure. Total intra-service moderate sedation time: 56 minutes. Patient's level of consciousness and vital signs were monitored continuously by radiology nurse throughout the procedure under the supervision of the provider performing the procedure. CONTRAST:  50 mL Omnipaque 300-administered into the collecting system(s) FLUOROSCOPY TIME:  Fluoroscopy Time: 18 minutes, 48 seconds, 93 mGy COMPLICATIONS: None  immediate. PROCEDURE: Informed consent was obtained for placement of antegrade ureter stents. Patient was placed prone on the interventional table. Bilateral nephrostomy tubes were prepped and draped in sterile fashion. Maximal barrier sterile technique was utilized including caps, mask, sterile gowns, sterile gloves, sterile drape, hand hygiene and skin antiseptic. Left nephrostomy tube was injected with contrast. Skin was anesthetized with 1% lidocaine. Catheter was cut and removed over a Bentson wire. Kumpe catheter was advanced to the distal left ureter. Contrast injection demonstrated obstruction at the left ureterovesical junction. A Roadrunner wire was successfully advanced into the urinary bladder. Catheter was advanced over the wire into the urinary bladder. The Foley catheter was clamped and contrast was injected in the bladder in order to create space around the Foley catheter balloon. Kumpe catheter was removed over a superstiff Amplatz wire. 9 French vascular sheath was placed and a Bentson wire was placed as a safety wire. An 8 [place], 22 cm ureter stent was advanced over the superstiff Amplatz wire and successfully deployed within the urinary bladder and in the left renal pelvis. 8.5 [place] multipurpose drain was advanced over the safety wire and reconstituted in the renal pelvis. Contrast was injected. The nephrostomy tube was flushed with saline and capped. Catheter was sutured to skin. Attention was directed to the right nephrostomy tube. Nephrostomy tube was injected with contrast.  Catheter was cut and removed over a Bentson wire. Kumpe catheter was advanced into the right ureter and there was an obstruction at the right ureterovesical junction. Roadrunner wire was successfully advanced into the urinary bladder and the catheter was advanced into the urinary bladder. Super stiff Amplatz wire was placed. 9 French vascular sheath was placed over the superstiff Amplatz wire and a Bentson wire was  placed as a safety wire. 8 [place], 22 cm ureter stent was advanced over the superstiff Amplatz wire and deployed within the urinary bladder and in the right kidney upper pole calyx. 8.5 French nephrostomy tube was placed over the Bentson wire and deployed in the renal pelvis. Nephrostomy tube was flushed and capped. Nephrostomy tube was sutured to skin. FINDINGS: Obstruction at the bilateral ureterovesical junctions. Catheters and wires were successfully advanced beyond the ureterovesical junction obstruction bilaterally. 8 French, 22 cm ureter stents were placed bilaterally. Left ureter stent is positioned in the renal pelvis along with an 8.5 French nephrostomy tube. The right ureter stent is coiled in the right upper pole calyx. Right nephrostomy tube is coiled in the renal pelvis. IMPRESSION: 1. Successful placement of bilateral antegrade ureter stents. 2. Replacement of bilateral nephrostomy tubes. Both nephrostomy tubes are capped. Nephrostomy tubes can be removed at a later time with fluoroscopy. Electronically Signed   By: Markus Daft M.D.   On: 08/31/2021 18:02   IR URETERAL STENT PLACEMENT EXISTING ACCESS RIGHT  Result Date: 08/31/2021 INDICATION: 87 year old with metastatic prostate cancer and bilateral obstructive uropathy. Bilateral percutaneous nephrostomy tubes were placed on 08/19/2021. Patient also has a Foley catheter. Urology has requested conversion to antegrade ureter stents. EXAM: 1. PLACEMENT OF BILATERAL ANTEGRADE URETER STENTS THROUGH EXISTING ACCESS 2. EXCHANGE OF BILATERAL NEPHROSTOMY TUBES MEDICATIONS: Rocephin 1 g; The antibiotic was administered in an appropriate time frame prior to skin puncture. ANESTHESIA/SEDATION: Moderate (conscious) sedation was employed during this procedure. A total of Versed 1.5mg  and fentanyl 75 mcg was administered intravenously at the order of the provider performing the procedure. Total intra-service moderate sedation time: 56 minutes. Patient's level  of consciousness and vital signs were monitored continuously by radiology nurse throughout the procedure under the supervision of the provider performing the procedure. CONTRAST:  50 mL Omnipaque 300-administered into the collecting system(s) FLUOROSCOPY TIME:  Fluoroscopy Time: 18 minutes, 48 seconds, 93 mGy COMPLICATIONS: None immediate. PROCEDURE: Informed consent was obtained for placement of antegrade ureter stents. Patient was placed prone on the interventional table. Bilateral nephrostomy tubes were prepped and draped in sterile fashion. Maximal barrier sterile technique was utilized including caps, mask, sterile gowns, sterile gloves, sterile drape, hand hygiene and skin antiseptic. Left nephrostomy tube was injected with contrast. Skin was anesthetized with 1% lidocaine. Catheter was cut and removed over a Bentson wire. Kumpe catheter was advanced to the distal left ureter. Contrast injection demonstrated obstruction at the left ureterovesical junction. A Roadrunner wire was successfully advanced into the urinary bladder. Catheter was advanced over the wire into the urinary bladder. The Foley catheter was clamped and contrast was injected in the bladder in order to create space around the Foley catheter balloon. Kumpe catheter was removed over a superstiff Amplatz wire. 9 French vascular sheath was placed and a Bentson wire was placed as a safety wire. An 8 [place], 22 cm ureter stent was advanced over the superstiff Amplatz wire and successfully deployed within the urinary bladder and in the left renal pelvis. 8.5 [place] multipurpose drain was advanced over the safety wire and reconstituted in  the renal pelvis. Contrast was injected. The nephrostomy tube was flushed with saline and capped. Catheter was sutured to skin. Attention was directed to the right nephrostomy tube. Nephrostomy tube was injected with contrast. Catheter was cut and removed over a Bentson wire. Kumpe catheter was advanced into the right  ureter and there was an obstruction at the right ureterovesical junction. Roadrunner wire was successfully advanced into the urinary bladder and the catheter was advanced into the urinary bladder. Super stiff Amplatz wire was placed. 9 French vascular sheath was placed over the superstiff Amplatz wire and a Bentson wire was placed as a safety wire. 8 [place], 22 cm ureter stent was advanced over the superstiff Amplatz wire and deployed within the urinary bladder and in the right kidney upper pole calyx. 8.5 French nephrostomy tube was placed over the Bentson wire and deployed in the renal pelvis. Nephrostomy tube was flushed and capped. Nephrostomy tube was sutured to skin. FINDINGS: Obstruction at the bilateral ureterovesical junctions. Catheters and wires were successfully advanced beyond the ureterovesical junction obstruction bilaterally. 8 French, 22 cm ureter stents were placed bilaterally. Left ureter stent is positioned in the renal pelvis along with an 8.5 French nephrostomy tube. The right ureter stent is coiled in the right upper pole calyx. Right nephrostomy tube is coiled in the renal pelvis. IMPRESSION: 1. Successful placement of bilateral antegrade ureter stents. 2. Replacement of bilateral nephrostomy tubes. Both nephrostomy tubes are capped. Nephrostomy tubes can be removed at a later time with fluoroscopy. Electronically Signed   By: Markus Daft M.D.   On: 08/31/2021 18:02    Labs:  CBC: Recent Labs    08/31/21 0304 08/31/21 1724 09/01/21 0531 09/01/21 1501 09/02/21 0207  WBC 9.6 10.4 10.5  --  10.2  HGB 7.4* 7.5* 6.8* 8.3* 8.2*  HCT 24.3* 24.6* 22.2* 25.5* 26.5*  PLT 86* 85* 89*  --  84*    COAGS: Recent Labs    08/18/21 0301 08/31/21 1724  INR 1.3* 2.0*    BMP: Recent Labs    08/30/21 0320 08/31/21 0304 09/01/21 0531 09/02/21 0207  NA 150* 149* 144 143  K 3.9 3.7 4.0 4.4  CL 112* 113* 112* 111  CO2 29 25 25  21*  GLUCOSE 101* 100* 121* 96  BUN 31* 33* 37* 44*   CALCIUM 8.4* 8.5* 8.4* 8.5*  CREATININE 2.83* 3.05* 3.00* 2.55*  GFRNONAA 21* 19* 20* 24*    LIVER FUNCTION TESTS: Recent Labs    08/20/21 2340 08/23/21 0313 08/24/21 0242 08/25/21 0300  BILITOT 0.9 0.7 0.4 0.8  AST 17 15 12* 11*  ALT 7 6 <5 7  ALKPHOS 269* 280* 285* 378*  PROT 6.0* 6.1* 5.8* 6.0*  ALBUMIN 2.2* 2.0* 2.0* 2.0*    Assessment and Plan:  Patient observed to be resting in bed.  He does not respond to questions and has unintelligible speech.  He is not in distress Bilateral nephrostomy tubes in place.  Both are capped with sutures and StatLock in place.  Insertion site is unremarkable.  Both dressings have yellowish drainage but is not saturating the dressing.  There is no redness, bleeding or other symptoms of infection at insertion sites.   Urethral catheter in place with approximately 400 cc blood-tinged urine with 1850 cc documented in epic over the past 24 hours.   Creatinine 2.55 today, 3.0 yesterday BUN 44 today, 37 yesterday GFR 24 today, 20 yesterday He is afebrile. Other vital signs stable.   Per Dr. Anselm Pancoast, please continue  to follow renal function.  If renal function remains stable, consider removal of nephrostomy tubes under fluoroscopy.  Electronically Signed: Tyson Alias, NP 09/02/2021, 10:55 AM   I spent a total of 15 Minutes at the the patient's bedside AND on the patient's hospital floor or unit, greater than 50% of which was counseling/coordinating care for  bilateral antegrade ureter stents and replacement of bilateral nephrostomy tubes.

## 2021-09-02 NOTE — Progress Notes (Signed)
Nutrition Follow-up  DOCUMENTATION CODES:   Non-severe (moderate) malnutrition in context of chronic illness  INTERVENTION:   Continue Osmolite 1.2 @ 70 ml/h (1680 ml per day)   Prosource TF 45 ml daily   Free water flushes 125 ml q 4 hrs.   Provides 2056 kcal, 104 gm protein, 2127 ml free water daily.   Monitor magnesium, potassium, and phosphorus daily for at least 3 days, MD to replete as needed, as pt is at risk for refeeding syndrome.   Continue MVI with minerals daily per tube.  Recommend permanent feeding access if continued aggressive care is desired; MD to speak with POA about PEG placement   NUTRITION DIAGNOSIS:   Moderate Malnutrition related to chronic illness (dementia) as evidenced by mild fat depletion, moderate fat depletion, mild muscle depletion, moderate muscle depletion.  Ongoing  GOAL:   Patient will meet greater than or equal to 90% of their needs  Met with TF  MONITOR:   TF tolerance, Labs, Weight trends, Skin, I & O's  REASON FOR ASSESSMENT:   Low Braden    ASSESSMENT:   Anthony Dudley is a 87 y.o. male with medical history significant of prostate cancer, CKD stage III, chronic indwelling Foley catheter, MDR UTI, dementia, bedbound at baseline, chronic diastolic CHF, hypertension, hyperlipidemia COVID infection in June 2020. He was sent to the ED from his nursing home for evaluation of worsening kidney function.  His creatinine was 2.5 on 07/09/21, 5.7 on 08/09/21, and now 6.1 on 08/16/21.  His hemoglobin was 6.6 on 08/10/21 and he received 2 units PRBCs at [facility], hemoglobin 9.0 at his facility on 08/16/21.  10/5- s/p BSE- advanced to dysphagia 1 diet with thin liquids; s/p cystoscopy and fulguration of bladder 10/6- s/p placement of bilateral PCNs 10/7- s/p BSE- downgraded to dysphagia 1 diet with honey thick liquids 10/14- cortrak tube placement unsuccessful 10/15- bedside NGT placement unsuccessful 10/17- NGT placed by IR 10/18-  s/p Placement of bilateral antegrade ureter stents thru existing access.   Replacement of bilateral nephrostomy tubes  Reviewed I/O's: +602 ml x 24 hours and -14.4 L since 08/19/21  UOP: 1.9 L x 24 hours   Case discussed with RN, who reports pt continues to tolerate TF well. Plan to remove nephrostomy tubes tomorrow. Per RN, pt approaching medical stability for discharge.   Case discussed with MD, who reports plan to discuss PEG placement with pt brother.   Medications reviewed and include magnesium oxide.   Labs reviewed: CBGS: 82-107 (inpatient orders for glycemic control are none).    Diet Order:   Diet Order             Diet NPO time specified Except for: Sips with Meds  Diet effective midnight                   EDUCATION NEEDS:   Not appropriate for education at this time  Skin:  Skin Assessment: Skin Integrity Issues: Skin Integrity Issues:: Incisions Incisions: closed penis  Last BM:  08/28/21  Height:   Ht Readings from Last 1 Encounters:  08/20/21 5' 9" (1.753 m)    Weight:   Wt Readings from Last 1 Encounters:  09/02/21 76 kg    Ideal Body Weight:  72.7 kg  BMI:  Body mass index is 24.74 kg/m.  Estimated Nutritional Needs:   Kcal:  2000-2200  Protein:  95-110 grams  Fluid:  > 2 L    Loistine Chance, RD, LDN, [place] Registered Dietitian II  Certified Diabetes Care and Education Specialist Please refer to [facility] for RD and/or RD on-call/weekend/after hours pager

## 2021-09-03 DIAGNOSIS — T83510D Infection and inflammatory reaction due to cystostomy catheter, subsequent encounter: Secondary | ICD-10-CM | POA: Diagnosis not present

## 2021-09-03 DIAGNOSIS — N390 Urinary tract infection, site not specified: Secondary | ICD-10-CM | POA: Diagnosis not present

## 2021-09-03 DIAGNOSIS — E440 Moderate protein-calorie malnutrition: Secondary | ICD-10-CM | POA: Diagnosis not present

## 2021-09-03 DIAGNOSIS — N135 Crossing vessel and stricture of ureter without hydronephrosis: Secondary | ICD-10-CM | POA: Diagnosis present

## 2021-09-03 LAB — CBC WITH DIFFERENTIAL/PLATELET
Abs Immature Granulocytes: 0.1 10*3/uL — ABNORMAL HIGH (ref 0.00–0.07)
Basophils Absolute: 0 10*3/uL (ref 0.0–0.1)
Basophils Relative: 0 %
Eosinophils Absolute: 0.3 10*3/uL (ref 0.0–0.5)
Eosinophils Relative: 3 %
HCT: 25.5 % — ABNORMAL LOW (ref 39.0–52.0)
Hemoglobin: 8.1 g/dL — ABNORMAL LOW (ref 13.0–17.0)
Immature Granulocytes: 1 %
Lymphocytes Relative: 23 %
Lymphs Abs: 2 10*3/uL (ref 0.7–4.0)
MCH: 30.2 pg (ref 26.0–34.0)
MCHC: 31.8 g/dL (ref 30.0–36.0)
MCV: 95.1 fL (ref 80.0–100.0)
Monocytes Absolute: 0.6 10*3/uL (ref 0.1–1.0)
Monocytes Relative: 7 %
Neutro Abs: 5.8 10*3/uL (ref 1.7–7.7)
Neutrophils Relative %: 66 %
Platelets: 88 10*3/uL — ABNORMAL LOW (ref 150–400)
RBC: 2.68 MIL/uL — ABNORMAL LOW (ref 4.22–5.81)
RDW: 19.7 % — ABNORMAL HIGH (ref 11.5–15.5)
WBC: 8.7 10*3/uL (ref 4.0–10.5)
nRBC: 0 % (ref 0.0–0.2)

## 2021-09-03 LAB — GLUCOSE, CAPILLARY
Glucose-Capillary: 103 mg/dL — ABNORMAL HIGH (ref 70–99)
Glucose-Capillary: 106 mg/dL — ABNORMAL HIGH (ref 70–99)
Glucose-Capillary: 112 mg/dL — ABNORMAL HIGH (ref 70–99)
Glucose-Capillary: 114 mg/dL — ABNORMAL HIGH (ref 70–99)
Glucose-Capillary: 114 mg/dL — ABNORMAL HIGH (ref 70–99)
Glucose-Capillary: 86 mg/dL (ref 70–99)
Glucose-Capillary: 94 mg/dL (ref 70–99)

## 2021-09-03 MED ORDER — OSMOLITE 1.2 CAL PO LIQD
1000.0000 mL | Freq: Two times a day (BID) | ORAL | Status: DC
  Administered 2021-09-03 – 2021-09-06 (×6): 1000 mL
  Filled 2021-09-03 (×7): qty 1000

## 2021-09-03 NOTE — Progress Notes (Addendum)
[facility] PROGRESS NOTE  Anthony Dudley KZL:[id number] DOB: Apr 05, 1933 DOA: 08/17/2021 PCP: [name], MD  Status:  Remains inpatient appropriate because:  Unsafe discharge plan, still requiring tube feedings via cortrack tube with possibility of placement of PEG tube   Barriers to discharge: Still requiring tube feedings via cortrack tube-May require PEG tube before discharge  Level of care:  Progressive   Code Status: Full Family Communication: On 10/20 attending physician spoke extensively with son regarding dysphagia with son requesting PEG tube if necessary. DVT prophylaxis: SCDs COVID vaccination status: Unknown  Foley catheter: #20 French Foley catheter placed by urologist Dr. Alinda Money; patient also has bilateral nephrostomy tubes as well as a ureteral drain/stent on the right placed on 10/19 Date inserted: 08/19/2021  HPI: 87 year old male with metastatic prostate CA, chronic indwelling Foley, CKD stage3, MDR, dementia, chronic bedbound status, diastolic CHF, hypertension, multiple other medical problems presented with AKI on CKD from the SNF.  Creatinine reportedly 2.5 on 8/26, 6.1 on 10/3.  He was recently transfused with hemoglobin of 6.6 at [facility] on 9/27. Patient was admitted with sepsis secondary to UTI and AKI on CKD.  Urology was consulted for hydronephrosis, underwent cystoscopy and fulguration of bladder on 10/5 with failed attempted stent placement.  IR was consulted for bilateral nephrostomy tubes, placed on 10/6.  Hospitalization was complicated by SVT, started on amiodarone.  Cardiology and PCCM were consulted.  Renal function has been gradually improving however patient remains very debilitated, poor oral intake, FTT.  Subjective: Patient very lethargic but did briefly awaken.  Also said the word "ow" when upper abdomen palpated  Objective: Vitals:   09/03/21 0409 09/03/21 0724  BP: (!) 99/45 (!) 101/49  Pulse: 84 85  Resp: (!) 22 15   Temp: 98.4 F (36.9 C) 98.2 F (36.8 C)  SpO2: 100% 98%    Intake/Output Summary (Last 24 hours) at 09/03/2021 0759 Last data filed at 09/03/2021 0755 Gross per 24 hour  Intake 0 ml  Output 1675 ml  Net -1675 ml   Filed Weights   09/01/21 0500 09/02/21 0647 09/03/21 0409  Weight: 76.4 kg 76 kg 76.3 kg    Exam:  Constitutional: NAD, calm, appears to be comfortable till upper abdomen palpated Respiratory: clear to auscultation bilaterally, no wheezing, no crackles. Normal respiratory effort. No accessory muscle use.  Room air with oximetry reading between 95 and 100% Cardiovascular: Regular rate and rhythm, no murmurs / rubs / gallops. No extremity edema.  Blood pressure currently ranging between 87/36-112/58 Abdomen: Mildly tender with palpation over the upper abdomen, bowel sounds are present.  Poor oral intake Neurologic: CN 2-12 grossly intact. Sensation intact, patient not following commands at this time so unable to accurately assess Psychiatric: Lethargic and only briefly responsive unable to accurately assess orientation   Assessment/Plan: Acute problems:   Acute-on-chronic kidney injury ([place]) stage IIIa with bilateral hydronephrosis, metabolic acidosis -Baseline creatinine 2.5, presenting creatinine 6.1 -s/p bilateral PCN by IR, urology  -s/p bilateral antegrade ureteral stents and replacement of bilateral nephrostomy tube by IR on 10/18  -Eventually the tubes will be removed under fluoroscopy.  Appreciate assistance of IR and urology   Sepsis secondary to UTI, POA due to chronic indwelling Foley catheter, bilateral multifocal pneumonia -Patient presented with tachypnea, tachycardia, leukocytosis.  Prior history of MDR Proteus, was treated with cefepime, vancomycin, completed 7 days to cover both UTI and pneumonia. -Foley was exchanged in ED by Dr. Alinda Money     Malnutrition of moderate degree, failure to  thrive, poor oral intake -Cortrack tube placed and currently  tolerating feedings will supplement -Discussed in detail with patient's brother regarding PEG tube.  -Patient has been somewhat lethargic in the last few days, this morning somewhat more alert and awake. Will repeat swallow evaluation, if fails, then will pursue PEG tube per patient's brother (HPOA).   -10/21 discussed with IR and due to patient's anatomy after review of CT, IR states that if feeding tube desired will need to consult GI or surgery   Acute on chronic anemia of blood loss, folic acid deficiency -Multiple chronic illness, hematuria, baseline ~7.5 -Transfused 1 unit on 10/19 for hemoglobin of 6.8-current hemoglobin 8.1 -Folic acid low 1.8-HUDJSHFW enteral replace   Hypernatremia -Due to poor oral intake, FTT, started on core track feeding -Added free water 125 cc every 4 hours, Na 143 on 10/21   Acute hypoxemic respiratory failure -With underlying severe multilobular bilateral bronchopneumonia -2D echo showed normal EF -Negative balance of 14.4 L, O2 sats stable 98% on room air   SVT -Treated with amiodarone now discontinued -Maintaining sinus rhythm -TSH normal     Data Reviewed: Basic Metabolic Panel: Recent Labs  Lab 08/28/21 1626 08/29/21 0310 08/30/21 0320 08/31/21 0304 08/31/21 1724 09/01/21 0531 09/01/21 1706 09/02/21 0207  NA  --  149* 150* 149*  --  144  --  143  K  --  4.1 3.9 3.7  --  4.0  --  4.4  CL  --  113* 112* 113*  --  112*  --  111  CO2  --  27 29 25   --  25  --  21*  GLUCOSE  --  104* 101* 100*  --  121*  --  96  BUN  --  31* 31* 33*  --  37*  --  44*  CREATININE  --  2.87* 2.83* 3.05*  --  3.00*  --  2.55*  CALCIUM  --  8.3* 8.4* 8.5*  --  8.4*  --  8.5*  MG 1.9  --   --  1.8 1.8 2.0 1.9  --   PHOS 3.0  --   --  3.0 3.0 2.4* 2.8  --     CBC: Recent Labs  Lab 08/30/21 0320 08/31/21 0304 08/31/21 1724 09/01/21 0531 09/01/21 1501 09/02/21 0207 09/03/21 0339  WBC 10.6* 9.6 10.4 10.5  --  10.2 8.7  NEUTROABS 7.7 7.0  --  7.9*   --  7.4 5.8  HGB 7.5* 7.4* 7.5* 6.8* 8.3* 8.2* 8.1*  HCT 24.4* 24.3* 24.6* 22.2* 25.5* 26.5* 25.5*  MCV 100.0 99.6 100.4* 100.9*  --  95.3 95.1  PLT 88* 86* 85* 89*  --  84* 88*    CBG: Recent Labs  Lab 09/02/21 1938 09/02/21 2048 09/03/21 0004 09/03/21 0349 09/03/21 0720  GLUCAP 99 110* 106* 103* 94     Scheduled Meds:  sodium chloride   Intravenous Once   sodium chloride   Intravenous Once   Chlorhexidine Gluconate Cloth  6 each Topical Daily   feeding supplement (OSMOLITE 1.2 CAL)  1,000 mL Per Tube Q12H   feeding supplement (PROSource TF)  45 mL Per Tube Daily   folic acid  1 mg Per Tube Daily   free water  125 mL Per Tube Q4H   magnesium oxide  400 mg Oral BID   multivitamin with minerals  1 tablet Per Tube Daily   oxybutynin  2.5 mg Per Tube BID     Principal Problem:  Acute-on-chronic kidney injury [facility]) Active Problems:   Metabolic acidosis   Sepsis ([place])   UTI (urinary tract infection) due to urinary indwelling catheter ([place])   Prostate cancer ([place])   Malnutrition of moderate degree   Consultants: Urology [facility] Cardiology Palliative medicine IR  Procedures: Cystoscopy Bilateral PCN Placement of bilateral antegrade ureteral stents  Antibiotics: Cefepime 10/4 through 10/11 Vancomycin single dosing on 10/6, 10/8, and 10/10   Time spent: 35 minutes    Erin Hearing ANP  [facility] 7 am - 330 pm/M-F for direct patient care and secure chat Please refer to Amion for contact info 16  days   Addendum 1:45 PM Attending MD note  Patient was seen, examined,treatment plan was discussed with the Ms. Lissa Merlin.I have personally reviewed the clinical findings, lab,EKG, imaging studies and management of this patient in detail. I agree with the above documentation and plan. Unfortunately, p.o. intake remains as the issue now, has been receiving tube feedings via core track.  I had discussed with patient's brother, HPOA on 10/20 who requested to  pursue PEG tube if patient fails the swallow evaluation or mental status is not improving.  Will assess over the next 24 to 48 hours and discuss with GI/surgery as PEG tube is not visible with IR assistance.  Estill Cotta M.D. [facility] 09/03/2021, 1:47 PM

## 2021-09-03 NOTE — Plan of Care (Signed)

## 2021-09-03 NOTE — Progress Notes (Signed)
Nutrition Follow-up  DOCUMENTATION CODES:   Non-severe (moderate) malnutrition in context of chronic illness  INTERVENTION:   Continue Osmolite 1.2 @ 70 ml/h (1680 ml per day)   Prosource TF 45 ml daily   Free water flushes 125 ml q 4 hrs.   Provides 2056 kcal, 104 gm protein, 2127 ml free water daily.   Monitor magnesium, potassium, and phosphorus daily for at least 3 days, MD to replete as needed, as pt is at risk for refeeding syndrome.   Continue MVI with minerals daily per tube.   Recommend permanent feeding access if continued aggressive care is desired; MD to speak with POA about PEG placement   -Initiate 48 hour calorie count; start calorie count on 09/04/21 as pt is currently NPO for procedure. RD will follow-up on Monday, 09/06/21 for results  NUTRITION DIAGNOSIS:   Moderate Malnutrition related to chronic illness (dementia) as evidenced by mild fat depletion, moderate fat depletion, mild muscle depletion, moderate muscle depletion.  Ongoing  GOAL:   Patient will meet greater than or equal to 90% of their needs  Progressing   MONITOR:   TF tolerance, Labs, Weight trends, Skin, I & O's  REASON FOR ASSESSMENT:   Low [place]    ASSESSMENT:   Anthony Dudley is a 87 y.o. male with medical history significant of prostate cancer, CKD stage III, chronic indwelling Foley catheter, MDR UTI, dementia, bedbound at baseline, chronic diastolic CHF, hypertension, hyperlipidemia COVID infection in June 2020. He was sent to the ED from his nursing home for evaluation of worsening kidney function.  His creatinine was 2.5 on 07/09/21, 5.7 on 08/09/21, and now 6.1 on 08/16/21.  His hemoglobin was 6.6 on 08/10/21 and he received 2 units [facility]BCs at [facility], hemoglobin 9.0 at his facility on 08/16/21.  10/5- s/p BSE- advanced to dysphagia 1 diet with thin liquids; s/p cystoscopy and fulguration of bladder 10/6- s/p placement of bilateral PCNs 10/7- s/p BSE- downgraded to  dysphagia 1 diet with honey thick liquids 10/14- cortrak tube placement unsuccessful 10/15- bedside NGT placement unsuccessful 10/17- NGT placed by IR 10/18- s/p Placement of bilateral antegrade ureter stents thru existing access.   Replacement of bilateral nephrostomy tubes  Reviewed I/O's: -1.7 L x 24 hours and -5.9 L since 08/20/21  UOP: 1.7 L x 24 hours   Plan to removed fluoroscopy tubes today. Pt currently NPO for procedure.   Per MD notes, plan for calorie count and PEG placement. RD will start calorie count tomorrow as pt is NPO for procedure today.   Labs reviewed: CBGS: 94-110 (inpatient orders for glycemic control are none).    Diet Order:   Diet Order             Diet NPO time specified Except for: Sips with Meds  Diet effective midnight                   EDUCATION NEEDS:   Not appropriate for education at this time  Skin:  Skin Assessment: Skin Integrity Issues: Skin Integrity Issues:: Incisions Incisions: closed penis  Last BM:  09/02/21  Height:   Ht Readings from Last 1 Encounters:  08/20/21 5\' 9"  (1.753 m)    Weight:   Wt Readings from Last 1 Encounters:  09/03/21 76.3 kg    Ideal Body Weight:  72.7 kg  BMI:  Body mass index is 24.84 kg/m.  Estimated Nutritional Needs:   Kcal:  2000-2200  Protein:  95-110 grams  Fluid:  > 2  Ricka Burdock, RD, LDN, [place] Registered Dietitian II Certified Diabetes Care and Education Specialist Please refer to [facility] for RD and/or RD on-call/weekend/after hours pager

## 2021-09-04 DIAGNOSIS — Z4659 Encounter for fitting and adjustment of other gastrointestinal appliance and device: Secondary | ICD-10-CM | POA: Diagnosis not present

## 2021-09-04 DIAGNOSIS — N179 Acute kidney failure, unspecified: Secondary | ICD-10-CM | POA: Diagnosis not present

## 2021-09-04 DIAGNOSIS — N135 Crossing vessel and stricture of ureter without hydronephrosis: Secondary | ICD-10-CM

## 2021-09-04 DIAGNOSIS — N181 Chronic kidney disease, stage 1: Secondary | ICD-10-CM

## 2021-09-04 DIAGNOSIS — N170 Acute kidney failure with tubular necrosis: Secondary | ICD-10-CM | POA: Diagnosis not present

## 2021-09-04 LAB — CBC WITH DIFFERENTIAL/PLATELET
Abs Immature Granulocytes: 0.08 10*3/uL — ABNORMAL HIGH (ref 0.00–0.07)
Basophils Absolute: 0 10*3/uL (ref 0.0–0.1)
Basophils Relative: 0 %
Eosinophils Absolute: 0.3 10*3/uL (ref 0.0–0.5)
Eosinophils Relative: 4 %
HCT: 25.5 % — ABNORMAL LOW (ref 39.0–52.0)
Hemoglobin: 8 g/dL — ABNORMAL LOW (ref 13.0–17.0)
Immature Granulocytes: 1 %
Lymphocytes Relative: 22 %
Lymphs Abs: 1.5 10*3/uL (ref 0.7–4.0)
MCH: 30.1 pg (ref 26.0–34.0)
MCHC: 31.4 g/dL (ref 30.0–36.0)
MCV: 95.9 fL (ref 80.0–100.0)
Monocytes Absolute: 0.5 10*3/uL (ref 0.1–1.0)
Monocytes Relative: 8 %
Neutro Abs: 4.3 10*3/uL (ref 1.7–7.7)
Neutrophils Relative %: 65 %
Platelets: 91 10*3/uL — ABNORMAL LOW (ref 150–400)
RBC: 2.66 MIL/uL — ABNORMAL LOW (ref 4.22–5.81)
RDW: 19 % — ABNORMAL HIGH (ref 11.5–15.5)
WBC: 6.7 10*3/uL (ref 4.0–10.5)
nRBC: 0 % (ref 0.0–0.2)

## 2021-09-04 LAB — GLUCOSE, CAPILLARY
Glucose-Capillary: 108 mg/dL — ABNORMAL HIGH (ref 70–99)
Glucose-Capillary: 113 mg/dL — ABNORMAL HIGH (ref 70–99)
Glucose-Capillary: 113 mg/dL — ABNORMAL HIGH (ref 70–99)
Glucose-Capillary: 121 mg/dL — ABNORMAL HIGH (ref 70–99)
Glucose-Capillary: 130 mg/dL — ABNORMAL HIGH (ref 70–99)
Glucose-Capillary: 95 mg/dL (ref 70–99)

## 2021-09-04 NOTE — Plan of Care (Signed)

## 2021-09-04 NOTE — Progress Notes (Signed)
[facility]                                                                              Patient Demographics  Anthony Dudley, is a 87 y.o. male, DOB - Sep 24, 1933, EXB:[id number]  Admit date - 08/17/2021   Admitting Physician Shela Leff, MD  Outpatient Primary MD for the patient is [name], MD  Outpatient specialists:   LOS - 17  days   Medical records reviewed and are as summarized below:    Chief Complaint  Patient presents with   Acute Renal Failure   Abnormal Lab       Brief summary   Patient is a 87 year old male with metastatic prostate CA, chronic indwelling Foley, CKD stage3, MDR, dementia, chronic bedbound status, diastolic CHF, hypertension, multiple other medical problems presented with AKI on CKD from the SNF.  Creatinine reportedly 2.5 on 8/26, 6.1 on 10/3.  He was recently transfused with hemoglobin of 6.6 at [facility] on 9/27. Patient was admitted with sepsis secondary to UTI and AKI on CKD.  Urology was consulted for hydronephrosis, underwent cystoscopy and fulguration of bladder on 10/5 with failed attempted stent placement.  IR was consulted for bilateral nephrostomy tubes, placed on 10/6.  Hospitalization was complicated by SVT, started on amiodarone.  Cardiology and PCCM were consulted.  Renal function has been gradually improving however patient remains very debilitated, poor oral intake, FTT.   Assessment & Plan    Principal Problem:   Acute-on-chronic kidney injury ([place]) stage IIIa with bilateral hydronephrosis, metabolic acidosis -Baseline creatinine reported to be 2.5, presented with creatinine of 6.1 -Underwent bilateral PCN by IR, urology and IR following.   -Underwent bilateral antegrade ureteral stents and replacement of bilateral nephrostomy tube by IR on 10/18  - plan for removing the nephrostomy tube under fluoro if renal function remains stable -Renal function improving, will recheck labs in  a.m.  Active Problems: Sepsis secondary to UTI, POA due to chronic indwelling Foley catheter, bilateral multifocal pneumonia -Patient presented with tachypnea, tachycardia, leukocytosis.  Prior history of MDR Proteus, was treated with cefepime, vancomycin, completed 7 days to cover both UTI and pneumonia. -Foley was exchanged in ED    Malnutrition of moderate degree, failure to thrive, poor oral intake -Patient started on core track feeding, currently tolerating tube feeds, free water -SLP evaluation repeated on 10/20, recommended dysphagia 1 diet with honey thick liquids  -Currently receiving tube feeds and free water via core track. -If no significant improvement in p.o. intake and mental status, pursue PEG tube per patient's brother's (HPOA) wishes -IR recommended feeding tube will need to be placed by GI or surgery due to patient's anatomy after reviewing the CT, will not be feasible by IR  Acute on chronic anemia of blood loss, folic acid deficiency -Multiple chronic illness, hematuria, baseline ~7.5 -Transfused 1 unit on 10/19 for hemoglobin of 6.8 -H&H currently stable, continue folic acid replacement   Hypernatremia -Due to poor oral intake, FTT, started on core track feeding -Added free water 125 cc every 4 hours -Follow BMET in a.m.  Acute hypoxemic respiratory failure -With underlying severe multilobular bilateral bronchopneumonia -2D  echo showed normal EF -Negative balance of 5.7 L  SVT -Currently in normal sinus rhythm, initially was started on amiodarone, has been off -2D echo showed normal EF, TSH normal  Code Status: Full CODE STATUS DVT Prophylaxis:  Place and maintain sequential compression device Start: 08/21/21 0754 SCDs Start: 08/18/21 0235   Level of Care: Level of care: Progressive Family Communication: Updated patient's brother on the phone in detail on 10/20.   Disposition Plan:     Status is: Inpatient  Remains inpatient appropriate because: IV  treatments appropriate due to intensity of illness, unable to take p.o., currently on core track feeding.  Plan for PEG tube if fails swallow evaluation  Time Spent in minutes 25 minutes Procedures:  Cystoscopy Bilateral PCN Placement of bilateral antegrade ureteral stents  Consultants:   Urology PCCM Cardiology Palliative medicine IR  Antimicrobials:   Anti-infectives (From admission, onward)    Start     Dose/Rate Route Frequency Ordered Stop   08/31/21 0600  cefTRIAXone (ROCEPHIN) 1 g in sodium chloride 0.9 % 100 mL IVPB       Note to Pharmacy: To be given in IR for IR procedure tentatively scheduled for 10.18.22   1 g 200 mL/hr over 30 Minutes Intravenous To Radiology 08/30/21 1608 08/31/21 1503   08/27/21 0000  cefTRIAXone (ROCEPHIN) 2 g in sodium chloride 0.9 % 100 mL IVPB  Status:  Discontinued        2 g 200 mL/hr over 30 Minutes Intravenous To Radiology 08/26/21 1419 08/27/21 1615   08/23/21 2200  vancomycin (VANCOCIN) IVPB 1000 mg/200 mL premix        1,000 mg 200 mL/hr over 60 Minutes Intravenous  Once 08/23/21 1631 08/23/21 2256   08/22/21 1800  ceFEPIme (MAXIPIME) 2 g in sodium chloride 0.9 % 100 mL IVPB        2 g 200 mL/hr over 30 Minutes Intravenous Every 24 hours 08/22/21 0727 08/24/21 1848   08/21/21 0830  vancomycin (VANCOREADY) IVPB 1250 mg/250 mL        1,250 mg 166.7 mL/hr over 90 Minutes Intravenous  Once 08/21/21 0733 08/21/21 1842   08/20/21 1800  ceFEPIme (MAXIPIME) 1 g in sodium chloride 0.9 % 100 mL IVPB  Status:  Discontinued        1 g 200 mL/hr over 30 Minutes Intravenous Every 24 hours 08/20/21 1457 08/22/21 0727   08/19/21 1800  ceFEPIme (MAXIPIME) 2 g in sodium chloride 0.9 % 100 mL IVPB  Status:  Discontinued        2 g 200 mL/hr over 30 Minutes Intravenous Every 24 hours 08/19/21 1553 08/20/21 1457   08/19/21 1645  vancomycin (VANCOREADY) IVPB 1500 mg/300 mL        1,500 mg 150 mL/hr over 120 Minutes Intravenous  Once 08/19/21 1551  08/19/21 1911   08/19/21 1551  vancomycin variable dose per unstable renal function (pharmacist dosing)  Status:  Discontinued         Does not apply See admin instructions 08/19/21 1551 08/25/21 0758   08/19/21 1545  ceFAZolin (ANCEF) IVPB 2g/100 mL premix        2 g 200 mL/hr over 30 Minutes Intravenous To Radiology 08/19/21 1452 08/19/21 1655   08/19/21 0000  ceFEPIme (MAXIPIME) 1 g in sodium chloride 0.9 % 100 mL IVPB  Status:  Discontinued        1 g 200 mL/hr over 30 Minutes Intravenous Every 24 hours 08/18/21 0254 08/19/21 1553   08/18/21 0115  ceFEPIme (MAXIPIME) 1 g in sodium chloride 0.9 % 100 mL IVPB        1 g 200 mL/hr over 30 Minutes Intravenous  Once 08/18/21 0111 08/18/21 0453          Medications  Scheduled Meds:  sodium chloride   Intravenous Once   sodium chloride   Intravenous Once   Chlorhexidine Gluconate Cloth  6 each Topical Daily   feeding supplement (OSMOLITE 1.2 CAL)  1,000 mL Per Tube Q12H   feeding supplement (PROSource TF)  45 mL Per Tube Daily   folic acid  1 mg Per Tube Daily   free water  125 mL Per Tube Q4H   magnesium oxide  400 mg Oral BID   multivitamin with minerals  1 tablet Per Tube Daily   oxybutynin  2.5 mg Per Tube BID   Continuous Infusions:   PRN Meds:.[EXPIRED] acetaminophen **FOLLOWED BY** acetaminophen, fentaNYL (SUBLIMAZE) injection, fentaNYL, food thickener, lidocaine (PF), lidocaine (PF), LORazepam, metoprolol tartrate, midazolam      Subjective:   Anthony Dudley was seen and examined today.  Much more alert and awake, able to answer some questions and yelling "please help me".  On questioning, patient wants his mittens to be removed.  Denied any pain.  Knows he is in the hospital.  Objective:   Vitals:   09/03/21 2321 09/04/21 0315 09/04/21 0403 09/04/21 0700  BP: (!) 95/48  (!) 110/53 (!) 103/54  Pulse:    83  Resp: 15  20 19   Temp: 97.6 F (36.4 C)  98.1 F (36.7 C) 97.8 F (36.6 C)  TempSrc: Oral  Oral  Oral  SpO2:   96% 100%  Weight:  77.8 kg    Height:        Intake/Output Summary (Last 24 hours) at 09/04/2021 1142 Last data filed at 09/04/2021 0800 Gross per 24 hour  Intake 0 ml  Output 1825 ml  Net -1825 ml     Wt Readings from Last 3 Encounters:  09/04/21 77.8 kg   Physical Exam General: Alert and oriented to self and place, appropriately answering some questions Cardiovascular: S1 S2 clear, RRR. No pedal edema b/l Respiratory: Fairly CTA B, no wheezing Gastrointestinal: Soft, nontender, nondistended, NBS Ext: no pedal edema bilaterally Neuro: moving all 4 extremities spontaneously  Data Reviewed:  I have personally reviewed following labs and imaging studies  Micro Results No results found for this or any previous visit (from the past 240 hour(s)).  Radiology Reports CT ABDOMEN PELVIS WO CONTRAST  Result Date: 08/19/2021 CLINICAL DATA:  Acute renal failure.  Bilateral hydronephrosis. EXAM: CT ABDOMEN AND PELVIS WITHOUT CONTRAST TECHNIQUE: Multidetector CT imaging of the abdomen and pelvis was performed following the standard protocol without IV contrast. COMPARISON:  12/07/2020 FINDINGS: Lower chest: Trace bilateral pleural effusions. Hepatobiliary: No focal liver abnormality is seen. No gallstones, gallbladder wall thickening, or biliary dilatation. Pancreas: Unremarkable. No pancreatic ductal dilatation or surrounding inflammatory changes. Spleen: Normal in size without focal abnormality. Adrenals/Urinary Tract: Adrenal glands are unremarkable. Moderate bilateral hydronephrosis and hydroureter. 6 mm nonobstructing calculus of the lower pole of the right kidney. 8 mm nonobstructing calculus at the lower pole of the left kidney. 4 mm calculus within the dependent portion of the bladder. The bladder is collapsed around a Foley catheter balloon which limits evaluation. Stomach/Bowel: No bowel dilatation to indicate ileus or obstruction. Extensive sigmoid and descending colon  diverticulosis without evidence of acute diverticulitis. Vascular/Lymphatic: Atherosclerotic calcifications seen throughout the abdominal aorta without aneurysm.  No enlarged abdominal pelvic lymph nodes. Reproductive: Calcifications noted within the prostate. Other: No abdominal wall hernia or abnormality. No abdominopelvic ascites. Musculoskeletal: Diffuse osteoblastic metastatic disease again seen. Unchanged moderate to severe compression deformity of the L1 vertebral body. IMPRESSION: Moderate bilateral hydronephrosis and hydroureter. Bilateral nonobstructing renal calculi. Electronically Signed   By: Miachel Roux M.D.   On: 08/19/2021 13:43   DG Abd 1 View  Result Date: 08/30/2021 CLINICAL DATA:  Feeding tube placement. EXAM: ABDOMEN - 1 VIEW COMPARISON:  None. FINDINGS: Small amount 10 mm Gastrografin administered through feeding tube. Feeding tube tip appears coiled in the region of the stomach. 1 low resolution intraoperative spot views of the stomach were obtained. No fracture visible on the limited views. Total fluoroscopy time: 3 minutes 24 seconds IMPRESSION: 1. Small amount of Gastrografin identified in the stomach. Feeding tube tip coiled in the stomach. Electronically Signed   By: Ronney Asters M.D.   On: 08/30/2021 16:53   DG Abd 1 View  Result Date: 08/28/2021 CLINICAL DATA:  Shortness of breath, nausea, vomiting. EXAM: ABDOMEN - 1 VIEW COMPARISON:  CT abdomen dated 08/19/2021. FINDINGS: New bilateral percutaneous nephrostomy tubes in place. No dilated large or small bowel loops are identified. Patient's RIGHT hand obscures visualization of the RIGHT lower quadrant. Blastic lesions throughout the visualized osseous structures, corresponding to previously described metastatic disease. IMPRESSION: 1. No acute findings. Nonobstructive bowel gas pattern. 2. Bilateral percutaneous nephrostomy tubes in place. 3. Diffuse osseous metastases, as previously described. Electronically Signed   By: Franki Cabot M.D.   On: 08/28/2021 10:20   NM Bone Scan Whole Body  Result Date: 08/30/2021 CLINICAL DATA:  History of metastatic prostate cancer, staging. EXAM: NUCLEAR MEDICINE WHOLE BODY BONE SCAN TECHNIQUE: Whole body anterior and posterior images were obtained approximately 3 hours after intravenous injection of radiopharmaceutical. RADIOPHARMACEUTICALS:  20.7 mCi Technetium-17m MDP IV COMPARISON:  CT August 19, 2021 FINDINGS: Bilateral kidneys are not visualized. There is abnormal osseous uptake throughout the axial and appendicular skeleton for instance involving the sternum, ribs, spine, pelvis and femurs. Additional abnormal multifocal radiotracer uptake involving the elbows, wrists, hands, knees, ankles and feet in a pattern most consistent with degenerative arthropathy. Photopenic defect in the distal femur and proximal tibia at the left knee likely reflects a knee arthroplasty. IMPRESSION: Scintigraphic findings consistent with extensive osteoblastic metastatic disease involving the axial and appendicular skeleton. Electronically Signed   By: Dahlia Bailiff M.D.   On: 08/30/2021 17:35   DG Cystogram  Result Date: 08/19/2021 CLINICAL DATA:  Surgery, elective. EXAM: CYSTOGRAM TECHNIQUE: A single intraprocedural fluoroscopic image from reported cystoscopy and bladder fulguration is submitted. FLUOROSCOPY TIME:  Fluoroscopy Time:  14 seconds COMPARISON:  Renal ultrasound 08/18/2021. CT abdomen/pelvis 06/06/2021. FINDINGS: A single intraprocedural fluoroscopic image of the pelvis from reported cystoscopy and bladder fulguration is submitted. On the provided image, a cystoscope projects in the region of the pelvis. Correlate with the procedural history. IMPRESSION: Single intraprocedural fluoroscopic image of the pelvis from reported cystoscopy and bladder fulguration. Please correlate with the procedural history. Electronically Signed   By: Kellie Simmering D.O.   On: 08/19/2021 09:36   US Renal  Result  Date: 08/18/2021 CLINICAL DATA:  Renal failure EXAM: RENAL / URINARY TRACT ULTRASOUND COMPLETE COMPARISON:  06/06/2021 FINDINGS: Right Kidney: Renal measurements: 10.2 x 5.3 x 5.3 cm. = volume: 150 mL. Moderate hydronephrosis is noted. This has progressed slightly in the interval from the prior CT. Previously seen right renal calculus is not  well appreciated on today's exam. Left Kidney: Renal measurements: 9.8 x 5.5 x 4.5 cm. = volume: 128 mL. Moderate left-sided hydronephrosis is noted as well stable in appearance from prior CT. Previously seen renal calculus is not well appreciated on today's exam. Bladder: Foley catheter is noted within the bladder. Other: None. IMPRESSION: Bilateral hydronephrosis left slightly greater than right similar to that seen on prior CT examination. Previously noted renal calculi bilaterally are not well appreciated on today's exam. Electronically Signed   By: Inez Catalina M.D.   On: 08/18/2021 00:30   DG CHEST PORT 1 VIEW  Result Date: 08/26/2021 CLINICAL DATA:  Shortness of breath. EXAM: PORTABLE CHEST 1 VIEW COMPARISON:  August 21, 2021 FINDINGS: Mild, bilateral multifocal infiltrates are seen. This is decreased in severity when compared to the prior study. There is no evidence of a pleural effusion or pneumothorax. The heart size and mediastinal contours are within normal limits. Moderate to marked severity calcification the aortic arch is noted. Diffusely sclerotic osseous structures are seen. IMPRESSION: Mild, bilateral multifocal infiltrates, decreased in severity when compared to the prior study. Electronically Signed   By: Virgina Norfolk M.D.   On: 08/26/2021 20:48   DG Chest Port 1 View  Result Date: 08/21/2021 CLINICAL DATA:  87 year old male with history of abnormal respiration. EXAM: PORTABLE CHEST 1 VIEW COMPARISON:  Chest x-ray 08/20/2021. FINDINGS: Lung volumes are low. Diffuse interstitial prominence and patchy ill-defined and nodular opacities are noted  throughout the lungs bilaterally. Small left pleural effusion. No definite right pleural effusion. No appreciable pneumothorax. Pulmonary vasculature does not appearing origin. Heart size is normal. The patient is rotated to the left on today's exam, resulting in distortion of the mediastinal contours and reduced diagnostic sensitivity and specificity for mediastinal pathology. Atherosclerotic calcifications in the thoracic aorta. IMPRESSION: 1. Severe multilobar bilateral bronchopneumonia again noted. 2. Small left pleural effusion. 3. Aortic atherosclerosis. Electronically Signed   By: Vinnie Langton M.D.   On: 08/21/2021 10:25   DG CHEST PORT 1 VIEW  Result Date: 08/20/2021 CLINICAL DATA:  Hypoxia, respiratory failure, confusion. EXAM: PORTABLE CHEST 1 VIEW COMPARISON:  Chest radiograph 08/19/2021, CT abdomen pelvis 08/19/2021 FINDINGS: Diffuse hazy interstitial thickening and prominence of the perihilar vasculature. Unchanged retrocardiac left lower lobe opacity. No pneumothorax. Stable cardiomediastinal silhouette. Aortic calcifications. Numerous sclerotic osseous lesions, particularly within thoracolumbar spine, better appreciated on yesterday's cross-sectional imaging. IMPRESSION: Mild pulmonary edema. Retrocardiac left lower lobe opacity may represent combination of pleural fluid and atelectasis, though pneumonia is not excluded. Electronically Signed   By: Ileana Roup M.D.   On: 08/20/2021 09:32   DG CHEST PORT 1 VIEW  Result Date: 08/19/2021 CLINICAL DATA:  Hypoxia. EXAM: PORTABLE CHEST 1 VIEW COMPARISON:  08/17/2021 FINDINGS: The cardiomediastinal silhouette is unchanged with normal heart size. Aortic atherosclerosis is noted. Lung volumes are low there are increasing patchy airspace opacities throughout the left lung with milder airspace opacities on the right. There is persistent elevation of the left hemidiaphragm. No large pleural effusion or pneumothorax is identified although the  patient's chin partially obscures the lung apices. Widespread sclerotic bone metastases are again noted. IMPRESSION: Increasing left greater than right lung airspace opacities concerning for pneumonia. Electronically Signed   By: Logan Bores M.D.   On: 08/19/2021 14:58   DG Chest Port 1 View  Result Date: 08/17/2021 CLINICAL DATA:  Shortness of breath EXAM: PORTABLE CHEST 1 VIEW COMPARISON:  06/06/2021, CT 06/06/2021 FINDINGS: Chronic scarring in the left upper lobe. No acute  airspace disease or pleural effusion. Stable cardiomediastinal silhouette with aortic atherosclerosis. Patchy bilateral skeletal sclerosis consistent with osseous metastatic disease. IMPRESSION: 1. Scarring in the left upper lobe.  No acute airspace disease. 2. Diffuse rib sclerosis consistent with skeletal metastatic disease Electronically Signed   By: Donavan Foil M.D.   On: 08/17/2021 23:41   ECHOCARDIOGRAM COMPLETE  Result Date: 08/21/2021    ECHOCARDIOGRAM REPORT   Patient Name:   YERACHMIEL SPINNEY Date of Exam: 08/21/2021 Medical Rec #:  [id number]      Height:       69.0 in Accession #:    [id number]     Weight:       180.8 lb Date of Birth:  Apr 06, 1933      BSA:          1.979 m Patient Age:    29 years       BP:           122/78 mmHg Patient Gender: M              HR:           110 bpm. Exam Location:  Inpatient Procedure: 2D Echo, Color Doppler and Cardiac Doppler Indications:    Ventricular Tachycardia I47.2  History:        Patient has no prior history of Echocardiogram examinations.  Sonographer:    Bernadene Person RDCS Referring Phys: [id number] [place] Comments: Image acquisition challenging due to patient behavioral factors. and Image acquisition challenging due to uncooperative patient. IMPRESSIONS  1. Left ventricular ejection fraction, by estimation, is 55 to 60%. The left ventricle has normal function. The left ventricle has no regional wall motion abnormalities. There is mild left ventricular  hypertrophy. Left ventricular diastolic parameters are indeterminate.  2. Right ventricular systolic function was not well visualized. The right ventricular size is not well visualized. There is normal pulmonary artery systolic pressure.  3. The mitral valve is normal in structure. Trivial mitral valve regurgitation. No evidence of mitral stenosis.  4. The aortic valve was not well visualized. Aortic valve regurgitation is not visualized. Mild to moderate aortic valve sclerosis/calcification is present, without any evidence of aortic stenosis. FINDINGS  Left Ventricle: Left ventricular ejection fraction, by estimation, is 55 to 60%. The left ventricle has normal function. The left ventricle has no regional wall motion abnormalities. The left ventricular internal cavity size was normal in size. There is  mild left ventricular hypertrophy. Left ventricular diastolic parameters are indeterminate. Right Ventricle: The right ventricular size is not well visualized. Right vetricular wall thickness was not well visualized. Right ventricular systolic function was not well visualized. There is normal pulmonary artery systolic pressure. The tricuspid regurgitant velocity is 2.36 m/s, and with an assumed right atrial pressure of 3 mmHg, the estimated right ventricular systolic pressure is 24.0 mmHg. Left Atrium: Left atrial size was normal in size. Right Atrium: Right atrial size was normal in size. Pericardium: There is no evidence of pericardial effusion. Mitral Valve: The mitral valve is normal in structure. Trivial mitral valve regurgitation. No evidence of mitral valve stenosis. Tricuspid Valve: The tricuspid valve is normal in structure. Tricuspid valve regurgitation is trivial. Aortic Valve: The aortic valve was not well visualized. Aortic valve regurgitation is not visualized. Mild to moderate aortic valve sclerosis/calcification is present, without any evidence of aortic stenosis. Pulmonic Valve: The pulmonic valve  was not well visualized. Pulmonic valve regurgitation is not visualized. Aorta: The aortic root and ascending aorta  are structurally normal, with no evidence of dilitation. IAS/Shunts: The interatrial septum was not well visualized.  LEFT VENTRICLE PLAX 2D LVIDd:         3.60 cm LVIDs:         2.60 cm LV PW:         0.90 cm LV IVS:        1.10 cm LVOT diam:     2.20 cm LV SV:         71 LV SV Index:   36 LVOT Area:     3.80 cm  RIGHT VENTRICLE TAPSE (M-mode): 1.6 cm LEFT ATRIUM             Index        RIGHT ATRIUM           Index LA diam:        3.10 cm 1.57 cm/m   RA Area:     11.30 cm LA Vol (A2C):   30.6 ml 15.46 ml/m  RA Volume:   22.60 ml  11.42 ml/m LA Vol (A4C):   31.3 ml 15.81 ml/m LA Biplane Vol: 31.1 ml 15.71 ml/m  AORTIC VALVE LVOT Vmax:   111.00 cm/s LVOT Vmean:  71.000 cm/s LVOT VTI:    0.187 m  AORTA Ao Asc diam: 3.50 cm TRICUSPID VALVE TR Peak grad:   22.3 mmHg TR Vmax:        236.00 cm/s  SHUNTS Systemic VTI:  0.19 m Systemic Diam: 2.20 cm Dani Gobble Croitoru MD Electronically signed by Sanda Klein MD Signature Date/Time: 08/21/2021/1:58:30 PM    Final    IR NEPHROSTOMY PLACEMENT LEFT  Result Date: 08/20/2021 INDICATION: History of metastatic prostate cancer, now with bilateral obstructive uropathy. Please perform placement of bilateral nephrostomy catheters for urinary diversion purposes. EXAM: ULTRASOUND AND FLUOROSCOPIC GUIDED PLACEMENT OF BILATERAL NEPHROSTOMY TUBES COMPARISON:  CT abdomen pelvis-08/19/2021 MEDICATIONS: Patient is currently admitted to the hospital receiving intravenous antibiotics.; The antibiotic was administered in an appropriate time frame prior to skin puncture. ANESTHESIA/SEDATION: Moderate (conscious) sedation was employed during this procedure, administered by the interventional radiology RN. A total of Versed 0.5 mg was administered intravenously. Moderate Sedation Time: 25 minutes. The patient's level of consciousness and vital signs were monitored continuously  by radiology nursing throughout the procedure under my direct supervision. CONTRAST:  40 mL Omnipaque 350-administered into both renal collecting systems. FLUOROSCOPY TIME:  1 minute, 48 seconds (1.3 mGy) COMPLICATIONS: None immediate. PROCEDURE: The procedure, risks, benefits, and alternatives were explained to the the patient's family, questions were encouraged and answered and informed consent was obtained. A timeout was performed prior to the initiation of the procedure. The operative sites were prepped and draped in the usual sterile fashion and a sterile drape was applied covering the operative field. A sterile gown and sterile gloves were used for the procedure. Local anesthesia was provided with 1% Lidocaine with epinephrine. Beginning with the left kidney, ultrasound was used to localize the left kidney. Under direct ultrasound guidance, a 20 gauge needle was advanced into the renal collecting system. An ultrasound image documentation was performed. Access within the collecting system was confirmed with the efflux of urine followed by limited contrast injection. Under intermittent fluoroscopic guidance, an 0.018 wire was advanced into the collecting system and the tract was dilated with an Accustick stent. Next, over a short Amplatz wire, the track was further dilated ultimately allowing placement of a 10-French percutaneous nephrostomy catheter with end coiled and locked within the renal pelvis.  Contrast was injected and several spot fluoroscopic images were obtained in various obliquities. The contralateral procedure was repeated for the right-sided nephrostomy catheter however note, an upper pole access was acquired secondary to angulation of the right kidney with the inferior pole being located deep and medial with poor sonographic visualization, accentuated due to patient's medical instability and inability to lie prone on the fluoroscopy table. Ultimately, under ultrasound fluoroscopic guidance, a 10  French nephrostomy catheter was placed via a posterosuperior calyx with end coiled and locked within the right renal pelvis. Contrast was injected several spot fluoroscopic images were obtained in various obliquities Both catheters were secured at the skin entrance site within interrupted sutures and StatLock devices. Both nephrostomies were connected to gravity bags. Dressings were applied. The patient tolerated procedure well without immediate postprocedural complication. FINDINGS: Ultrasound scanning demonstrates a moderate dilated bilateral collecting systems. Under a combination of ultrasound and fluoroscopic guidance, a left-sided posterior inferior calix was targeted allowing placement of a 10-French percutaneous nephrostomy catheter with end coiled and locked within the renal pelvis. Contrast injection confirmed appropriate positioning. Under a combination of ultrasound and fluoroscopic guidance, a right-sided posterosuperior calyx was targeted (an inferior calyx was unable to be accessed secondary to angulation of the right kidney as well as patient's somewhat unstable status and inability to lie on the fluoroscopy table) allowing placement of a 10 French percutaneous nephrostomy catheter with end coiled and locked within the renal pelvis. Contrast injection confirmed appropriate positioning. IMPRESSION: Successful ultrasound and fluoroscopic guided placement of bilateral 10 French percutaneous nephrostomy catheters. Note, as above, the left-sided nephrostomy catheter is via a posteroinferior calyx while the right-sided nephrostomy catheter is via a posterosuperior caliceal access. Electronically Signed   By: Sandi Mariscal M.D.   On: 08/20/2021 12:43   IR NEPHROSTOMY PLACEMENT RIGHT  Result Date: 08/20/2021 INDICATION: History of metastatic prostate cancer, now with bilateral obstructive uropathy. Please perform placement of bilateral nephrostomy catheters for urinary diversion purposes. EXAM: ULTRASOUND  AND FLUOROSCOPIC GUIDED PLACEMENT OF BILATERAL NEPHROSTOMY TUBES COMPARISON:  CT abdomen pelvis-08/19/2021 MEDICATIONS: Patient is currently admitted to the hospital receiving intravenous antibiotics.; The antibiotic was administered in an appropriate time frame prior to skin puncture. ANESTHESIA/SEDATION: Moderate (conscious) sedation was employed during this procedure, administered by the interventional radiology RN. A total of Versed 0.5 mg was administered intravenously. Moderate Sedation Time: 25 minutes. The patient's level of consciousness and vital signs were monitored continuously by radiology nursing throughout the procedure under my direct supervision. CONTRAST:  40 mL Omnipaque 350-administered into both renal collecting systems. FLUOROSCOPY TIME:  1 minute, 48 seconds (1.3 mGy) COMPLICATIONS: None immediate. PROCEDURE: The procedure, risks, benefits, and alternatives were explained to the the patient's family, questions were encouraged and answered and informed consent was obtained. A timeout was performed prior to the initiation of the procedure. The operative sites were prepped and draped in the usual sterile fashion and a sterile drape was applied covering the operative field. A sterile gown and sterile gloves were used for the procedure. Local anesthesia was provided with 1% Lidocaine with epinephrine. Beginning with the left kidney, ultrasound was used to localize the left kidney. Under direct ultrasound guidance, a 20 gauge needle was advanced into the renal collecting system. An ultrasound image documentation was performed. Access within the collecting system was confirmed with the efflux of urine followed by limited contrast injection. Under intermittent fluoroscopic guidance, an 0.018 wire was advanced into the collecting system and the tract was dilated with an Accustick stent. Next,  over a short Amplatz wire, the track was further dilated ultimately allowing placement of a 10-French  percutaneous nephrostomy catheter with end coiled and locked within the renal pelvis. Contrast was injected and several spot fluoroscopic images were obtained in various obliquities. The contralateral procedure was repeated for the right-sided nephrostomy catheter however note, an upper pole access was acquired secondary to angulation of the right kidney with the inferior pole being located deep and medial with poor sonographic visualization, accentuated due to patient's medical instability and inability to lie prone on the fluoroscopy table. Ultimately, under ultrasound fluoroscopic guidance, a 10 French nephrostomy catheter was placed via a posterosuperior calyx with end coiled and locked within the right renal pelvis. Contrast was injected several spot fluoroscopic images were obtained in various obliquities Both catheters were secured at the skin entrance site within interrupted sutures and StatLock devices. Both nephrostomies were connected to gravity bags. Dressings were applied. The patient tolerated procedure well without immediate postprocedural complication. FINDINGS: Ultrasound scanning demonstrates a moderate dilated bilateral collecting systems. Under a combination of ultrasound and fluoroscopic guidance, a left-sided posterior inferior calix was targeted allowing placement of a 10-French percutaneous nephrostomy catheter with end coiled and locked within the renal pelvis. Contrast injection confirmed appropriate positioning. Under a combination of ultrasound and fluoroscopic guidance, a right-sided posterosuperior calyx was targeted (an inferior calyx was unable to be accessed secondary to angulation of the right kidney as well as patient's somewhat unstable status and inability to lie on the fluoroscopy table) allowing placement of a 10 French percutaneous nephrostomy catheter with end coiled and locked within the renal pelvis. Contrast injection confirmed appropriate positioning. IMPRESSION:  Successful ultrasound and fluoroscopic guided placement of bilateral 10 French percutaneous nephrostomy catheters. Note, as above, the left-sided nephrostomy catheter is via a posteroinferior calyx while the right-sided nephrostomy catheter is via a posterosuperior caliceal access. Electronically Signed   By: Sandi Mariscal M.D.   On: 08/20/2021 12:43   IR URETERAL STENT PLACEMENT EXISTING ACCESS LEFT  Result Date: 08/31/2021 INDICATION: 87 year old with metastatic prostate cancer and bilateral obstructive uropathy. Bilateral percutaneous nephrostomy tubes were placed on 08/19/2021. Patient also has a Foley catheter. Urology has requested conversion to antegrade ureter stents. EXAM: 1. PLACEMENT OF BILATERAL ANTEGRADE URETER STENTS THROUGH EXISTING ACCESS 2. EXCHANGE OF BILATERAL NEPHROSTOMY TUBES MEDICATIONS: Rocephin 1 g; The antibiotic was administered in an appropriate time frame prior to skin puncture. ANESTHESIA/SEDATION: Moderate (conscious) sedation was employed during this procedure. A total of Versed 1.5mg  and fentanyl 75 mcg was administered intravenously at the order of the provider performing the procedure. Total intra-service moderate sedation time: 56 minutes. Patient's level of consciousness and vital signs were monitored continuously by radiology nurse throughout the procedure under the supervision of the provider performing the procedure. CONTRAST:  50 mL Omnipaque 300-administered into the collecting system(s) FLUOROSCOPY TIME:  Fluoroscopy Time: 18 minutes, 48 seconds, 93 mGy COMPLICATIONS: None immediate. PROCEDURE: Informed consent was obtained for placement of antegrade ureter stents. Patient was placed prone on the interventional table. Bilateral nephrostomy tubes were prepped and draped in sterile fashion. Maximal barrier sterile technique was utilized including caps, mask, sterile gowns, sterile gloves, sterile drape, hand hygiene and skin antiseptic. Left nephrostomy tube was injected  with contrast. Skin was anesthetized with 1% lidocaine. Catheter was cut and removed over a Bentson wire. Kumpe catheter was advanced to the distal left ureter. Contrast injection demonstrated obstruction at the left ureterovesical junction. A Roadrunner wire was successfully advanced into the urinary bladder. Catheter was  advanced over the wire into the urinary bladder. The Foley catheter was clamped and contrast was injected in the bladder in order to create space around the Foley catheter balloon. Kumpe catheter was removed over a superstiff Amplatz wire. 9 French vascular sheath was placed and a Bentson wire was placed as a safety wire. An 8 [place], 22 cm ureter stent was advanced over the superstiff Amplatz wire and successfully deployed within the urinary bladder and in the left renal pelvis. 8.5 [place] multipurpose drain was advanced over the safety wire and reconstituted in the renal pelvis. Contrast was injected. The nephrostomy tube was flushed with saline and capped. Catheter was sutured to skin. Attention was directed to the right nephrostomy tube. Nephrostomy tube was injected with contrast. Catheter was cut and removed over a Bentson wire. Kumpe catheter was advanced into the right ureter and there was an obstruction at the right ureterovesical junction. Roadrunner wire was successfully advanced into the urinary bladder and the catheter was advanced into the urinary bladder. Super stiff Amplatz wire was placed. 9 French vascular sheath was placed over the superstiff Amplatz wire and a Bentson wire was placed as a safety wire. 8 [place], 22 cm ureter stent was advanced over the superstiff Amplatz wire and deployed within the urinary bladder and in the right kidney upper pole calyx. 8.5 French nephrostomy tube was placed over the Bentson wire and deployed in the renal pelvis. Nephrostomy tube was flushed and capped. Nephrostomy tube was sutured to skin. FINDINGS: Obstruction at the bilateral  ureterovesical junctions. Catheters and wires were successfully advanced beyond the ureterovesical junction obstruction bilaterally. 8 French, 22 cm ureter stents were placed bilaterally. Left ureter stent is positioned in the renal pelvis along with an 8.5 French nephrostomy tube. The right ureter stent is coiled in the right upper pole calyx. Right nephrostomy tube is coiled in the renal pelvis. IMPRESSION: 1. Successful placement of bilateral antegrade ureter stents. 2. Replacement of bilateral nephrostomy tubes. Both nephrostomy tubes are capped. Nephrostomy tubes can be removed at a later time with fluoroscopy. Electronically Signed   By: Markus Daft M.D.   On: 08/31/2021 18:02   IR URETERAL STENT PLACEMENT EXISTING ACCESS RIGHT  Result Date: 08/31/2021 INDICATION: 87 year old with metastatic prostate cancer and bilateral obstructive uropathy. Bilateral percutaneous nephrostomy tubes were placed on 08/19/2021. Patient also has a Foley catheter. Urology has requested conversion to antegrade ureter stents. EXAM: 1. PLACEMENT OF BILATERAL ANTEGRADE URETER STENTS THROUGH EXISTING ACCESS 2. EXCHANGE OF BILATERAL NEPHROSTOMY TUBES MEDICATIONS: Rocephin 1 g; The antibiotic was administered in an appropriate time frame prior to skin puncture. ANESTHESIA/SEDATION: Moderate (conscious) sedation was employed during this procedure. A total of Versed 1.5mg  and fentanyl 75 mcg was administered intravenously at the order of the provider performing the procedure. Total intra-service moderate sedation time: 56 minutes. Patient's level of consciousness and vital signs were monitored continuously by radiology nurse throughout the procedure under the supervision of the provider performing the procedure. CONTRAST:  50 mL Omnipaque 300-administered into the collecting system(s) FLUOROSCOPY TIME:  Fluoroscopy Time: 18 minutes, 48 seconds, 93 mGy COMPLICATIONS: None immediate. PROCEDURE: Informed consent was obtained for placement  of antegrade ureter stents. Patient was placed prone on the interventional table. Bilateral nephrostomy tubes were prepped and draped in sterile fashion. Maximal barrier sterile technique was utilized including caps, mask, sterile gowns, sterile gloves, sterile drape, hand hygiene and skin antiseptic. Left nephrostomy tube was injected with contrast. Skin was anesthetized with 1% lidocaine. Catheter was cut and removed  over a Bentson wire. Kumpe catheter was advanced to the distal left ureter. Contrast injection demonstrated obstruction at the left ureterovesical junction. A Roadrunner wire was successfully advanced into the urinary bladder. Catheter was advanced over the wire into the urinary bladder. The Foley catheter was clamped and contrast was injected in the bladder in order to create space around the Foley catheter balloon. Kumpe catheter was removed over a superstiff Amplatz wire. 9 French vascular sheath was placed and a Bentson wire was placed as a safety wire. An 8 [place], 22 cm ureter stent was advanced over the superstiff Amplatz wire and successfully deployed within the urinary bladder and in the left renal pelvis. 8.5 [place] multipurpose drain was advanced over the safety wire and reconstituted in the renal pelvis. Contrast was injected. The nephrostomy tube was flushed with saline and capped. Catheter was sutured to skin. Attention was directed to the right nephrostomy tube. Nephrostomy tube was injected with contrast. Catheter was cut and removed over a Bentson wire. Kumpe catheter was advanced into the right ureter and there was an obstruction at the right ureterovesical junction. Roadrunner wire was successfully advanced into the urinary bladder and the catheter was advanced into the urinary bladder. Super stiff Amplatz wire was placed. 9 French vascular sheath was placed over the superstiff Amplatz wire and a Bentson wire was placed as a safety wire. 8 [place], 22 cm ureter stent was advanced  over the superstiff Amplatz wire and deployed within the urinary bladder and in the right kidney upper pole calyx. 8.5 French nephrostomy tube was placed over the Bentson wire and deployed in the renal pelvis. Nephrostomy tube was flushed and capped. Nephrostomy tube was sutured to skin. FINDINGS: Obstruction at the bilateral ureterovesical junctions. Catheters and wires were successfully advanced beyond the ureterovesical junction obstruction bilaterally. 8 French, 22 cm ureter stents were placed bilaterally. Left ureter stent is positioned in the renal pelvis along with an 8.5 French nephrostomy tube. The right ureter stent is coiled in the right upper pole calyx. Right nephrostomy tube is coiled in the renal pelvis. IMPRESSION: 1. Successful placement of bilateral antegrade ureter stents. 2. Replacement of bilateral nephrostomy tubes. Both nephrostomy tubes are capped. Nephrostomy tubes can be removed at a later time with fluoroscopy. Electronically Signed   By: Markus Daft M.D.   On: 08/31/2021 18:02    Lab Data:  CBC: Recent Labs  Lab 08/31/21 0304 08/31/21 1724 09/01/21 0531 09/01/21 1501 09/02/21 0207 09/03/21 0339 09/04/21 0222  WBC 9.6 10.4 10.5  --  10.2 8.7 6.7  NEUTROABS 7.0  --  7.9*  --  7.4 5.8 4.3  HGB 7.4* 7.5* 6.8* 8.3* 8.2* 8.1* 8.0*  HCT 24.3* 24.6* 22.2* 25.5* 26.5* 25.5* 25.5*  MCV 99.6 100.4* 100.9*  --  95.3 95.1 95.9  PLT 86* 85* 89*  --  84* 88* 91*   Basic Metabolic Panel: Recent Labs  Lab 08/28/21 1626 08/29/21 0310 08/30/21 0320 08/31/21 0304 08/31/21 1724 09/01/21 0531 09/01/21 1706 09/02/21 0207  NA  --  149* 150* 149*  --  144  --  143  K  --  4.1 3.9 3.7  --  4.0  --  4.4  CL  --  113* 112* 113*  --  112*  --  111  CO2  --  27 29 25   --  25  --  21*  GLUCOSE  --  104* 101* 100*  --  121*  --  96  BUN  --  31* 31* 33*  --  37*  --  44*  CREATININE  --  2.87* 2.83* 3.05*  --  3.00*  --  2.55*  CALCIUM  --  8.3* 8.4* 8.5*  --  8.4*  --  8.5*  MG  1.9  --   --  1.8 1.8 2.0 1.9  --   PHOS 3.0  --   --  3.0 3.0 2.4* 2.8  --    GFR: Estimated Creatinine Clearance: 20.8 mL/min (A) (by C-G formula based on SCr of 2.55 mg/dL (H)). Liver Function Tests: No results for input(s): AST, ALT, ALKPHOS, BILITOT, PROT, ALBUMIN in the last 168 hours. No results for input(s): LIPASE, AMYLASE in the last 168 hours. No results for input(s): AMMONIA in the last 168 hours. Coagulation Profile: Recent Labs  Lab 08/31/21 1724  INR 2.0*   Cardiac Enzymes: No results for input(s): CKTOTAL, CKMB, CKMBINDEX, TROPONINI in the last 168 hours. BNP (last 3 results) No results for input(s): PROBNP in the last 8760 hours. HbA1C: No results for input(s): HGBA1C in the last 72 hours. CBG: Recent Labs  Lab 09/03/21 1623 09/03/21 2029 09/03/21 2333 09/04/21 0401 09/04/21 0734  GLUCAP 114* 114* 112* 108* 113*   Lipid Profile: No results for input(s): CHOL, HDL, LDLCALC, TRIG, CHOLHDL, LDLDIRECT in the last 72 hours. Thyroid Function Tests: No results for input(s): TSH, T4TOTAL, FREET4, T3FREE, THYROIDAB in the last 72 hours. Anemia Panel: No results for input(s): VITAMINB12, FOLATE, FERRITIN, TIBC, IRON, RETICCTPCT in the last 72 hours.  Urine analysis:    Component Value Date/Time   COLORURINE YELLOW 08/17/2021 0034   APPEARANCEUR CLOUDY (A) 08/17/2021 0034   LABSPEC 1.009 08/17/2021 0034   PHURINE 6.0 08/17/2021 0034   GLUCOSEU NEGATIVE 08/17/2021 0034   HGBUR LARGE (A) 08/17/2021 0034   BILIRUBINUR NEGATIVE 08/17/2021 0034   KETONESUR NEGATIVE 08/17/2021 0034   PROTEINUR 100 (A) 08/17/2021 0034   NITRITE NEGATIVE 08/17/2021 0034   LEUKOCYTESUR LARGE (A) 08/17/2021 0034     Beza Steppe M.D. [facility] 09/04/2021, 11:42 AM  Available via Epic secure chat 7am-7pm After 7 pm, please refer to night coverage provider listed on amion.

## 2021-09-05 DIAGNOSIS — N189 Chronic kidney disease, unspecified: Secondary | ICD-10-CM | POA: Diagnosis not present

## 2021-09-05 DIAGNOSIS — N135 Crossing vessel and stricture of ureter without hydronephrosis: Secondary | ICD-10-CM | POA: Diagnosis not present

## 2021-09-05 DIAGNOSIS — N179 Acute kidney failure, unspecified: Secondary | ICD-10-CM | POA: Diagnosis not present

## 2021-09-05 DIAGNOSIS — Z4659 Encounter for fitting and adjustment of other gastrointestinal appliance and device: Secondary | ICD-10-CM | POA: Diagnosis not present

## 2021-09-05 LAB — BASIC METABOLIC PANEL
Anion gap: 10 (ref 5–15)
BUN: 56 mg/dL — ABNORMAL HIGH (ref 8–23)
CO2: 19 mmol/L — ABNORMAL LOW (ref 22–32)
Calcium: 8.2 mg/dL — ABNORMAL LOW (ref 8.9–10.3)
Chloride: 112 mmol/L — ABNORMAL HIGH (ref 98–111)
Creatinine, Ser: 2.02 mg/dL — ABNORMAL HIGH (ref 0.61–1.24)
GFR, Estimated: 32 mL/min — ABNORMAL LOW (ref >60)
Glucose, Bld: 115 mg/dL — ABNORMAL HIGH (ref 70–99)
Potassium: 4.9 mmol/L (ref 3.5–5.1)
Sodium: 141 mmol/L (ref 135–145)

## 2021-09-05 LAB — GLUCOSE, CAPILLARY
Glucose-Capillary: 102 mg/dL — ABNORMAL HIGH (ref 70–99)
Glucose-Capillary: 113 mg/dL — ABNORMAL HIGH (ref 70–99)
Glucose-Capillary: 103 mg/dL — ABNORMAL HIGH (ref 70–99)
Glucose-Capillary: 117 mg/dL — ABNORMAL HIGH (ref 70–99)
Glucose-Capillary: 127 mg/dL — ABNORMAL HIGH (ref 70–99)

## 2021-09-05 NOTE — Progress Notes (Signed)
[facility]                                                                              Patient Demographics  Anthony Dudley, is a 87 y.o. male, DOB - Jun 10, 1933, PNT:[id number]  Admit date - 08/17/2021   Admitting Physician Shela Leff, MD  Outpatient Primary MD for the patient is [name], MD  Outpatient specialists:   LOS - 18  days   Medical records reviewed and are as summarized below:    Chief Complaint  Patient presents with   Acute Renal Failure   Abnormal Lab       Brief summary   Patient is a 87 year old male with metastatic prostate CA, chronic indwelling Foley, CKD stage3, MDR, dementia, chronic bedbound status, diastolic CHF, hypertension, multiple other medical problems presented with AKI on CKD from the SNF.  Creatinine reportedly 2.5 on 8/26, 6.1 on 10/3.  He was recently transfused with hemoglobin of 6.6 at [facility] on 9/27. Patient was admitted with sepsis secondary to UTI and AKI on CKD.  Urology was consulted for hydronephrosis, underwent cystoscopy and fulguration of bladder on 10/5 with failed attempted stent placement.  IR was consulted for bilateral nephrostomy tubes, placed on 10/6.  Hospitalization was complicated by SVT, started on amiodarone.  Cardiology and PCCM were consulted.  Renal function has been gradually improving however patient remains very debilitated, poor oral intake, FTT.   Assessment & Plan    Principal Problem:   Acute-on-chronic kidney injury ([place]) stage IIIa with bilateral hydronephrosis, metabolic acidosis -Baseline creatinine reported to be 2.5, presented with creatinine of 6.1 -Underwent bilateral PCN by IR, urology and IR following.   -Underwent bilateral antegrade ureteral stents and replacement of bilateral nephrostomy tube by IR on 10/18  -Renal function improving, 2.0, plan for removing nephrostomy tube tomorrow  -Continue n.p.o. status, IR to evaluate for procedure   Active  Problems: Sepsis secondary to UTI, POA due to chronic indwelling Foley catheter, bilateral multifocal pneumonia -Patient presented with tachypnea, tachycardia, leukocytosis.  Prior history of MDR Proteus, was treated with cefepime, vancomycin, completed 7 days to cover both UTI and pneumonia. -Foley was exchanged in ED    Malnutrition of moderate degree, failure to thrive, poor oral intake -Patient started on core track feeding, currently tolerating tube feeds, free water -SLP evaluation repeated on 10/20, recommended dysphagia 1 diet with honey thick liquids  -Currently on tube feeds and free water via core track. -Plan for PEG tube feeding, ongoing discussions with patient's POA  Acute on chronic anemia of blood loss, folic acid deficiency -Multiple chronic illness, hematuria, baseline ~7.5 -Transfused 1 unit on 10/19 for hemoglobin of 6.8 -Hemoglobin stable, continue folic acid replacement  Hypernatremia -Due to poor oral intake, FTT, started on core track feeding -Added free water 125 cc every 4 hours -Sodium improving  Acute hypoxemic respiratory failure -With underlying severe multilobular bilateral bronchopneumonia -2D echo showed normal EF  SVT -Currently in normal sinus rhythm, initially was started on amiodarone, has been off -2D echo showed normal EF, TSH normal  Code Status: Full CODE STATUS DVT Prophylaxis:  Place and maintain sequential compression device  Start: 08/21/21 0754 SCDs Start: 08/18/21 0235   Level of Care: Level of care: Progressive Family Communication: Updated patient's brother on the phone in detail on 10/20.   Disposition Plan:     Status is: Inpatient  Remains inpatient appropriate because: IV treatments appropriate due to intensity of illness, unable to take p.o., currently on core track feeding.   Nephrostomy tubes to be removed by IR, plan for PEG tube placement  Time Spent in minutes 25 minutes  Procedures:  Cystoscopy Bilateral  PCN Placement of bilateral antegrade ureteral stents  Consultants:   Urology PCCM Cardiology Palliative medicine IR  Antimicrobials:   Anti-infectives (From admission, onward)    Start     Dose/Rate Route Frequency Ordered Stop   08/31/21 0600  cefTRIAXone (ROCEPHIN) 1 g in sodium chloride 0.9 % 100 mL IVPB       Note to Pharmacy: To be given in IR for IR procedure tentatively scheduled for 10.18.22   1 g 200 mL/hr over 30 Minutes Intravenous To Radiology 08/30/21 1608 08/31/21 1503   08/27/21 0000  cefTRIAXone (ROCEPHIN) 2 g in sodium chloride 0.9 % 100 mL IVPB  Status:  Discontinued        2 g 200 mL/hr over 30 Minutes Intravenous To Radiology 08/26/21 1419 08/27/21 1615   08/23/21 2200  vancomycin (VANCOCIN) IVPB 1000 mg/200 mL premix        1,000 mg 200 mL/hr over 60 Minutes Intravenous  Once 08/23/21 1631 08/23/21 2256   08/22/21 1800  ceFEPIme (MAXIPIME) 2 g in sodium chloride 0.9 % 100 mL IVPB        2 g 200 mL/hr over 30 Minutes Intravenous Every 24 hours 08/22/21 0727 08/24/21 1848   08/21/21 0830  vancomycin (VANCOREADY) IVPB 1250 mg/250 mL        1,250 mg 166.7 mL/hr over 90 Minutes Intravenous  Once 08/21/21 0733 08/21/21 1842   08/20/21 1800  ceFEPIme (MAXIPIME) 1 g in sodium chloride 0.9 % 100 mL IVPB  Status:  Discontinued        1 g 200 mL/hr over 30 Minutes Intravenous Every 24 hours 08/20/21 1457 08/22/21 0727   08/19/21 1800  ceFEPIme (MAXIPIME) 2 g in sodium chloride 0.9 % 100 mL IVPB  Status:  Discontinued        2 g 200 mL/hr over 30 Minutes Intravenous Every 24 hours 08/19/21 1553 08/20/21 1457   08/19/21 1645  vancomycin (VANCOREADY) IVPB 1500 mg/300 mL        1,500 mg 150 mL/hr over 120 Minutes Intravenous  Once 08/19/21 1551 08/19/21 1911   08/19/21 1551  vancomycin variable dose per unstable renal function (pharmacist dosing)  Status:  Discontinued         Does not apply See admin instructions 08/19/21 1551 08/25/21 0758   08/19/21 1545  ceFAZolin  (ANCEF) IVPB 2g/100 mL premix        2 g 200 mL/hr over 30 Minutes Intravenous To Radiology 08/19/21 1452 08/19/21 1655   08/19/21 0000  ceFEPIme (MAXIPIME) 1 g in sodium chloride 0.9 % 100 mL IVPB  Status:  Discontinued        1 g 200 mL/hr over 30 Minutes Intravenous Every 24 hours 08/18/21 0254 08/19/21 1553   08/18/21 0115  ceFEPIme (MAXIPIME) 1 g in sodium chloride 0.9 % 100 mL IVPB        1 g 200 mL/hr over 30 Minutes Intravenous  Once 08/18/21 0111 08/18/21 0453  Medications  Scheduled Meds:  sodium chloride   Intravenous Once   sodium chloride   Intravenous Once   Chlorhexidine Gluconate Cloth  6 each Topical Daily   feeding supplement (OSMOLITE 1.2 CAL)  1,000 mL Per Tube Q12H   feeding supplement (PROSource TF)  45 mL Per Tube Daily   folic acid  1 mg Per Tube Daily   free water  125 mL Per Tube Q4H   magnesium oxide  400 mg Oral BID   multivitamin with minerals  1 tablet Per Tube Daily   oxybutynin  2.5 mg Per Tube BID   Continuous Infusions:   PRN Meds:.[EXPIRED] acetaminophen **FOLLOWED BY** acetaminophen, fentaNYL (SUBLIMAZE) injection, fentaNYL, food thickener, lidocaine (PF), lidocaine (PF), LORazepam, metoprolol tartrate, midazolam      Subjective:   Rande Mcevoy was seen and examined today.  Sleeping, arousable.,  Intermittent waxing and waning mental status.  Overnight no acute issues.  Objective:   Vitals:   09/04/21 2000 09/04/21 2353 09/05/21 0423 09/05/21 0427  BP: (!) 106/55 (!) 110/53 (!) 101/47   Pulse: 100  88   Resp: 16 (!) 22 (!) 24   Temp: 98.5 F (36.9 C) 97.7 F (36.5 C) 98 F (36.7 C)   TempSrc: Oral Oral Oral   SpO2: 97% 100% 94%   Weight:    76.5 kg  Height:        Intake/Output Summary (Last 24 hours) at 09/05/2021 1315 Last data filed at 09/05/2021 1045 Gross per 24 hour  Intake 1703.83 ml  Output 1875 ml  Net -171.17 ml     Wt Readings from Last 3 Encounters:  09/05/21 76.5 kg   Physical  Exam General: Somnolent, arousable, appears to be comfortable Cardiovascular: S1 S2 clear, RRR.  Respiratory: CTAB, no wheezing, Gastrointestinal: Soft, nontender, nondistended, NBS Ext: no pedal edema bilaterally  GU: Foley with clear urine  Data Reviewed:  I have personally reviewed following labs and imaging studies  Micro Results No results found for this or any previous visit (from the past 240 hour(s)).  Radiology Reports CT ABDOMEN PELVIS WO CONTRAST  Result Date: 08/19/2021 CLINICAL DATA:  Acute renal failure.  Bilateral hydronephrosis. EXAM: CT ABDOMEN AND PELVIS WITHOUT CONTRAST TECHNIQUE: Multidetector CT imaging of the abdomen and pelvis was performed following the standard protocol without IV contrast. COMPARISON:  12/07/2020 FINDINGS: Lower chest: Trace bilateral pleural effusions. Hepatobiliary: No focal liver abnormality is seen. No gallstones, gallbladder wall thickening, or biliary dilatation. Pancreas: Unremarkable. No pancreatic ductal dilatation or surrounding inflammatory changes. Spleen: Normal in size without focal abnormality. Adrenals/Urinary Tract: Adrenal glands are unremarkable. Moderate bilateral hydronephrosis and hydroureter. 6 mm nonobstructing calculus of the lower pole of the right kidney. 8 mm nonobstructing calculus at the lower pole of the left kidney. 4 mm calculus within the dependent portion of the bladder. The bladder is collapsed around a Foley catheter balloon which limits evaluation. Stomach/Bowel: No bowel dilatation to indicate ileus or obstruction. Extensive sigmoid and descending colon diverticulosis without evidence of acute diverticulitis. Vascular/Lymphatic: Atherosclerotic calcifications seen throughout the abdominal aorta without aneurysm. No enlarged abdominal pelvic lymph nodes. Reproductive: Calcifications noted within the prostate. Other: No abdominal wall hernia or abnormality. No abdominopelvic ascites. Musculoskeletal: Diffuse osteoblastic  metastatic disease again seen. Unchanged moderate to severe compression deformity of the L1 vertebral body. IMPRESSION: Moderate bilateral hydronephrosis and hydroureter. Bilateral nonobstructing renal calculi. Electronically Signed   By: Miachel Roux M.D.   On: 08/19/2021 13:43   DG Abd 1 View  Result  Date: 08/30/2021 CLINICAL DATA:  Feeding tube placement. EXAM: ABDOMEN - 1 VIEW COMPARISON:  None. FINDINGS: Small amount 10 mm Gastrografin administered through feeding tube. Feeding tube tip appears coiled in the region of the stomach. 1 low resolution intraoperative spot views of the stomach were obtained. No fracture visible on the limited views. Total fluoroscopy time: 3 minutes 24 seconds IMPRESSION: 1. Small amount of Gastrografin identified in the stomach. Feeding tube tip coiled in the stomach. Electronically Signed   By: Ronney Asters M.D.   On: 08/30/2021 16:53   DG Abd 1 View  Result Date: 08/28/2021 CLINICAL DATA:  Shortness of breath, nausea, vomiting. EXAM: ABDOMEN - 1 VIEW COMPARISON:  CT abdomen dated 08/19/2021. FINDINGS: New bilateral percutaneous nephrostomy tubes in place. No dilated large or small bowel loops are identified. Patient's RIGHT hand obscures visualization of the RIGHT lower quadrant. Blastic lesions throughout the visualized osseous structures, corresponding to previously described metastatic disease. IMPRESSION: 1. No acute findings. Nonobstructive bowel gas pattern. 2. Bilateral percutaneous nephrostomy tubes in place. 3. Diffuse osseous metastases, as previously described. Electronically Signed   By: Franki Cabot M.D.   On: 08/28/2021 10:20   NM Bone Scan Whole Body  Result Date: 08/30/2021 CLINICAL DATA:  History of metastatic prostate cancer, staging. EXAM: NUCLEAR MEDICINE WHOLE BODY BONE SCAN TECHNIQUE: Whole body anterior and posterior images were obtained approximately 3 hours after intravenous injection of radiopharmaceutical. RADIOPHARMACEUTICALS:  20.7 mCi  Technetium-73m MDP IV COMPARISON:  CT August 19, 2021 FINDINGS: Bilateral kidneys are not visualized. There is abnormal osseous uptake throughout the axial and appendicular skeleton for instance involving the sternum, ribs, spine, pelvis and femurs. Additional abnormal multifocal radiotracer uptake involving the elbows, wrists, hands, knees, ankles and feet in a pattern most consistent with degenerative arthropathy. Photopenic defect in the distal femur and proximal tibia at the left knee likely reflects a knee arthroplasty. IMPRESSION: Scintigraphic findings consistent with extensive osteoblastic metastatic disease involving the axial and appendicular skeleton. Electronically Signed   By: Dahlia Bailiff M.D.   On: 08/30/2021 17:35   DG Cystogram  Result Date: 08/19/2021 CLINICAL DATA:  Surgery, elective. EXAM: CYSTOGRAM TECHNIQUE: A single intraprocedural fluoroscopic image from reported cystoscopy and bladder fulguration is submitted. FLUOROSCOPY TIME:  Fluoroscopy Time:  14 seconds COMPARISON:  Renal ultrasound 08/18/2021. CT abdomen/pelvis 06/06/2021. FINDINGS: A single intraprocedural fluoroscopic image of the pelvis from reported cystoscopy and bladder fulguration is submitted. On the provided image, a cystoscope projects in the region of the pelvis. Correlate with the procedural history. IMPRESSION: Single intraprocedural fluoroscopic image of the pelvis from reported cystoscopy and bladder fulguration. Please correlate with the procedural history. Electronically Signed   By: Kellie Simmering D.O.   On: 08/19/2021 09:36   US Renal  Result Date: 08/18/2021 CLINICAL DATA:  Renal failure EXAM: RENAL / URINARY TRACT ULTRASOUND COMPLETE COMPARISON:  06/06/2021 FINDINGS: Right Kidney: Renal measurements: 10.2 x 5.3 x 5.3 cm. = volume: 150 mL. Moderate hydronephrosis is noted. This has progressed slightly in the interval from the prior CT. Previously seen right renal calculus is not well appreciated on today's  exam. Left Kidney: Renal measurements: 9.8 x 5.5 x 4.5 cm. = volume: 128 mL. Moderate left-sided hydronephrosis is noted as well stable in appearance from prior CT. Previously seen renal calculus is not well appreciated on today's exam. Bladder: Foley catheter is noted within the bladder. Other: None. IMPRESSION: Bilateral hydronephrosis left slightly greater than right similar to that seen on prior CT examination. Previously noted renal  calculi bilaterally are not well appreciated on today's exam. Electronically Signed   By: Inez Catalina M.D.   On: 08/18/2021 00:30   DG CHEST PORT 1 VIEW  Result Date: 08/26/2021 CLINICAL DATA:  Shortness of breath. EXAM: PORTABLE CHEST 1 VIEW COMPARISON:  August 21, 2021 FINDINGS: Mild, bilateral multifocal infiltrates are seen. This is decreased in severity when compared to the prior study. There is no evidence of a pleural effusion or pneumothorax. The heart size and mediastinal contours are within normal limits. Moderate to marked severity calcification the aortic arch is noted. Diffusely sclerotic osseous structures are seen. IMPRESSION: Mild, bilateral multifocal infiltrates, decreased in severity when compared to the prior study. Electronically Signed   By: Virgina Norfolk M.D.   On: 08/26/2021 20:48   DG Chest Port 1 View  Result Date: 08/21/2021 CLINICAL DATA:  87 year old male with history of abnormal respiration. EXAM: PORTABLE CHEST 1 VIEW COMPARISON:  Chest x-ray 08/20/2021. FINDINGS: Lung volumes are low. Diffuse interstitial prominence and patchy ill-defined and nodular opacities are noted throughout the lungs bilaterally. Small left pleural effusion. No definite right pleural effusion. No appreciable pneumothorax. Pulmonary vasculature does not appearing origin. Heart size is normal. The patient is rotated to the left on today's exam, resulting in distortion of the mediastinal contours and reduced diagnostic sensitivity and specificity for mediastinal  pathology. Atherosclerotic calcifications in the thoracic aorta. IMPRESSION: 1. Severe multilobar bilateral bronchopneumonia again noted. 2. Small left pleural effusion. 3. Aortic atherosclerosis. Electronically Signed   By: Vinnie Langton M.D.   On: 08/21/2021 10:25   DG CHEST PORT 1 VIEW  Result Date: 08/20/2021 CLINICAL DATA:  Hypoxia, respiratory failure, confusion. EXAM: PORTABLE CHEST 1 VIEW COMPARISON:  Chest radiograph 08/19/2021, CT abdomen pelvis 08/19/2021 FINDINGS: Diffuse hazy interstitial thickening and prominence of the perihilar vasculature. Unchanged retrocardiac left lower lobe opacity. No pneumothorax. Stable cardiomediastinal silhouette. Aortic calcifications. Numerous sclerotic osseous lesions, particularly within thoracolumbar spine, better appreciated on yesterday's cross-sectional imaging. IMPRESSION: Mild pulmonary edema. Retrocardiac left lower lobe opacity may represent combination of pleural fluid and atelectasis, though pneumonia is not excluded. Electronically Signed   By: Ileana Roup M.D.   On: 08/20/2021 09:32   DG CHEST PORT 1 VIEW  Result Date: 08/19/2021 CLINICAL DATA:  Hypoxia. EXAM: PORTABLE CHEST 1 VIEW COMPARISON:  08/17/2021 FINDINGS: The cardiomediastinal silhouette is unchanged with normal heart size. Aortic atherosclerosis is noted. Lung volumes are low there are increasing patchy airspace opacities throughout the left lung with milder airspace opacities on the right. There is persistent elevation of the left hemidiaphragm. No large pleural effusion or pneumothorax is identified although the patient's chin partially obscures the lung apices. Widespread sclerotic bone metastases are again noted. IMPRESSION: Increasing left greater than right lung airspace opacities concerning for pneumonia. Electronically Signed   By: Logan Bores M.D.   On: 08/19/2021 14:58   DG Chest Port 1 View  Result Date: 08/17/2021 CLINICAL DATA:  Shortness of breath EXAM: PORTABLE  CHEST 1 VIEW COMPARISON:  06/06/2021, CT 06/06/2021 FINDINGS: Chronic scarring in the left upper lobe. No acute airspace disease or pleural effusion. Stable cardiomediastinal silhouette with aortic atherosclerosis. Patchy bilateral skeletal sclerosis consistent with osseous metastatic disease. IMPRESSION: 1. Scarring in the left upper lobe.  No acute airspace disease. 2. Diffuse rib sclerosis consistent with skeletal metastatic disease Electronically Signed   By: Donavan Foil M.D.   On: 08/17/2021 23:41   ECHOCARDIOGRAM COMPLETE  Result Date: 08/21/2021    ECHOCARDIOGRAM REPORT   Patient  Name:   [facility] [place] Date of Exam: 08/21/2021 Medical Rec #:  [id number]      Height:       69.0 in Accession #:    [id number]     Weight:       180.8 lb Date of Birth:  17-Jun-1933      BSA:          1.979 m Patient Age:    69 years       BP:           122/78 mmHg Patient Gender: M              HR:           110 bpm. Exam Location:  Inpatient Procedure: 2D Echo, Color Doppler and Cardiac Doppler Indications:    Ventricular Tachycardia I47.2  History:        Patient has no prior history of Echocardiogram examinations.  Sonographer:    Bernadene Person RDCS Referring Phys: [id number] [place] Comments: Image acquisition challenging due to patient behavioral factors. and Image acquisition challenging due to uncooperative patient. IMPRESSIONS  1. Left ventricular ejection fraction, by estimation, is 55 to 60%. The left ventricle has normal function. The left ventricle has no regional wall motion abnormalities. There is mild left ventricular hypertrophy. Left ventricular diastolic parameters are indeterminate.  2. Right ventricular systolic function was not well visualized. The right ventricular size is not well visualized. There is normal pulmonary artery systolic pressure.  3. The mitral valve is normal in structure. Trivial mitral valve regurgitation. No evidence of mitral stenosis.  4. The aortic valve was not  well visualized. Aortic valve regurgitation is not visualized. Mild to moderate aortic valve sclerosis/calcification is present, without any evidence of aortic stenosis. FINDINGS  Left Ventricle: Left ventricular ejection fraction, by estimation, is 55 to 60%. The left ventricle has normal function. The left ventricle has no regional wall motion abnormalities. The left ventricular internal cavity size was normal in size. There is  mild left ventricular hypertrophy. Left ventricular diastolic parameters are indeterminate. Right Ventricle: The right ventricular size is not well visualized. Right vetricular wall thickness was not well visualized. Right ventricular systolic function was not well visualized. There is normal pulmonary artery systolic pressure. The tricuspid regurgitant velocity is 2.36 m/s, and with an assumed right atrial pressure of 3 mmHg, the estimated right ventricular systolic pressure is 26.9 mmHg. Left Atrium: Left atrial size was normal in size. Right Atrium: Right atrial size was normal in size. Pericardium: There is no evidence of pericardial effusion. Mitral Valve: The mitral valve is normal in structure. Trivial mitral valve regurgitation. No evidence of mitral valve stenosis. Tricuspid Valve: The tricuspid valve is normal in structure. Tricuspid valve regurgitation is trivial. Aortic Valve: The aortic valve was not well visualized. Aortic valve regurgitation is not visualized. Mild to moderate aortic valve sclerosis/calcification is present, without any evidence of aortic stenosis. Pulmonic Valve: The pulmonic valve was not well visualized. Pulmonic valve regurgitation is not visualized. Aorta: The aortic root and ascending aorta are structurally normal, with no evidence of dilitation. IAS/Shunts: The interatrial septum was not well visualized.  LEFT VENTRICLE PLAX 2D LVIDd:         3.60 cm LVIDs:         2.60 cm LV PW:         0.90 cm LV IVS:        1.10 cm LVOT diam:     2.20  cm LV SV:          71 LV SV Index:   36 LVOT Area:     3.80 cm  RIGHT VENTRICLE TAPSE (M-mode): 1.6 cm LEFT ATRIUM             Index        RIGHT ATRIUM           Index LA diam:        3.10 cm 1.57 cm/m   RA Area:     11.30 cm LA Vol (A2C):   30.6 ml 15.46 ml/m  RA Volume:   22.60 ml  11.42 ml/m LA Vol (A4C):   31.3 ml 15.81 ml/m LA Biplane Vol: 31.1 ml 15.71 ml/m  AORTIC VALVE LVOT Vmax:   111.00 cm/s LVOT Vmean:  71.000 cm/s LVOT VTI:    0.187 m  AORTA Ao Asc diam: 3.50 cm TRICUSPID VALVE TR Peak grad:   22.3 mmHg TR Vmax:        236.00 cm/s  SHUNTS Systemic VTI:  0.19 m Systemic Diam: 2.20 cm Dani Gobble Croitoru MD Electronically signed by Sanda Klein MD Signature Date/Time: 08/21/2021/1:58:30 PM    Final    IR NEPHROSTOMY PLACEMENT LEFT  Result Date: 08/20/2021 INDICATION: History of metastatic prostate cancer, now with bilateral obstructive uropathy. Please perform placement of bilateral nephrostomy catheters for urinary diversion purposes. EXAM: ULTRASOUND AND FLUOROSCOPIC GUIDED PLACEMENT OF BILATERAL NEPHROSTOMY TUBES COMPARISON:  CT abdomen pelvis-08/19/2021 MEDICATIONS: Patient is currently admitted to the hospital receiving intravenous antibiotics.; The antibiotic was administered in an appropriate time frame prior to skin puncture. ANESTHESIA/SEDATION: Moderate (conscious) sedation was employed during this procedure, administered by the interventional radiology RN. A total of Versed 0.5 mg was administered intravenously. Moderate Sedation Time: 25 minutes. The patient's level of consciousness and vital signs were monitored continuously by radiology nursing throughout the procedure under my direct supervision. CONTRAST:  40 mL Omnipaque 350-administered into both renal collecting systems. FLUOROSCOPY TIME:  1 minute, 48 seconds (1.3 mGy) COMPLICATIONS: None immediate. PROCEDURE: The procedure, risks, benefits, and alternatives were explained to the the patient's family, questions were encouraged and answered and  informed consent was obtained. A timeout was performed prior to the initiation of the procedure. The operative sites were prepped and draped in the usual sterile fashion and a sterile drape was applied covering the operative field. A sterile gown and sterile gloves were used for the procedure. Local anesthesia was provided with 1% Lidocaine with epinephrine. Beginning with the left kidney, ultrasound was used to localize the left kidney. Under direct ultrasound guidance, a 20 gauge needle was advanced into the renal collecting system. An ultrasound image documentation was performed. Access within the collecting system was confirmed with the efflux of urine followed by limited contrast injection. Under intermittent fluoroscopic guidance, an 0.018 wire was advanced into the collecting system and the tract was dilated with an Accustick stent. Next, over a short Amplatz wire, the track was further dilated ultimately allowing placement of a 10-French percutaneous nephrostomy catheter with end coiled and locked within the renal pelvis. Contrast was injected and several spot fluoroscopic images were obtained in various obliquities. The contralateral procedure was repeated for the right-sided nephrostomy catheter however note, an upper pole access was acquired secondary to angulation of the right kidney with the inferior pole being located deep and medial with poor sonographic visualization, accentuated due to patient's medical instability and inability to lie prone on the fluoroscopy table. Ultimately, under ultrasound fluoroscopic guidance, a  10 French nephrostomy catheter was placed via a posterosuperior calyx with end coiled and locked within the right renal pelvis. Contrast was injected several spot fluoroscopic images were obtained in various obliquities Both catheters were secured at the skin entrance site within interrupted sutures and StatLock devices. Both nephrostomies were connected to gravity bags. Dressings  were applied. The patient tolerated procedure well without immediate postprocedural complication. FINDINGS: Ultrasound scanning demonstrates a moderate dilated bilateral collecting systems. Under a combination of ultrasound and fluoroscopic guidance, a left-sided posterior inferior calix was targeted allowing placement of a 10-French percutaneous nephrostomy catheter with end coiled and locked within the renal pelvis. Contrast injection confirmed appropriate positioning. Under a combination of ultrasound and fluoroscopic guidance, a right-sided posterosuperior calyx was targeted (an inferior calyx was unable to be accessed secondary to angulation of the right kidney as well as patient's somewhat unstable status and inability to lie on the fluoroscopy table) allowing placement of a 10 French percutaneous nephrostomy catheter with end coiled and locked within the renal pelvis. Contrast injection confirmed appropriate positioning. IMPRESSION: Successful ultrasound and fluoroscopic guided placement of bilateral 10 French percutaneous nephrostomy catheters. Note, as above, the left-sided nephrostomy catheter is via a posteroinferior calyx while the right-sided nephrostomy catheter is via a posterosuperior caliceal access. Electronically Signed   By: Sandi Mariscal M.D.   On: 08/20/2021 12:43   IR NEPHROSTOMY PLACEMENT RIGHT  Result Date: 08/20/2021 INDICATION: History of metastatic prostate cancer, now with bilateral obstructive uropathy. Please perform placement of bilateral nephrostomy catheters for urinary diversion purposes. EXAM: ULTRASOUND AND FLUOROSCOPIC GUIDED PLACEMENT OF BILATERAL NEPHROSTOMY TUBES COMPARISON:  CT abdomen pelvis-08/19/2021 MEDICATIONS: Patient is currently admitted to the hospital receiving intravenous antibiotics.; The antibiotic was administered in an appropriate time frame prior to skin puncture. ANESTHESIA/SEDATION: Moderate (conscious) sedation was employed during this procedure,  administered by the interventional radiology RN. A total of Versed 0.5 mg was administered intravenously. Moderate Sedation Time: 25 minutes. The patient's level of consciousness and vital signs were monitored continuously by radiology nursing throughout the procedure under my direct supervision. CONTRAST:  40 mL Omnipaque 350-administered into both renal collecting systems. FLUOROSCOPY TIME:  1 minute, 48 seconds (1.3 mGy) COMPLICATIONS: None immediate. PROCEDURE: The procedure, risks, benefits, and alternatives were explained to the the patient's family, questions were encouraged and answered and informed consent was obtained. A timeout was performed prior to the initiation of the procedure. The operative sites were prepped and draped in the usual sterile fashion and a sterile drape was applied covering the operative field. A sterile gown and sterile gloves were used for the procedure. Local anesthesia was provided with 1% Lidocaine with epinephrine. Beginning with the left kidney, ultrasound was used to localize the left kidney. Under direct ultrasound guidance, a 20 gauge needle was advanced into the renal collecting system. An ultrasound image documentation was performed. Access within the collecting system was confirmed with the efflux of urine followed by limited contrast injection. Under intermittent fluoroscopic guidance, an 0.018 wire was advanced into the collecting system and the tract was dilated with an Accustick stent. Next, over a short Amplatz wire, the track was further dilated ultimately allowing placement of a 10-French percutaneous nephrostomy catheter with end coiled and locked within the renal pelvis. Contrast was injected and several spot fluoroscopic images were obtained in various obliquities. The contralateral procedure was repeated for the right-sided nephrostomy catheter however note, an upper pole access was acquired secondary to angulation of the right kidney with the inferior pole  being  located deep and medial with poor sonographic visualization, accentuated due to patient's medical instability and inability to lie prone on the fluoroscopy table. Ultimately, under ultrasound fluoroscopic guidance, a 10 French nephrostomy catheter was placed via a posterosuperior calyx with end coiled and locked within the right renal pelvis. Contrast was injected several spot fluoroscopic images were obtained in various obliquities Both catheters were secured at the skin entrance site within interrupted sutures and StatLock devices. Both nephrostomies were connected to gravity bags. Dressings were applied. The patient tolerated procedure well without immediate postprocedural complication. FINDINGS: Ultrasound scanning demonstrates a moderate dilated bilateral collecting systems. Under a combination of ultrasound and fluoroscopic guidance, a left-sided posterior inferior calix was targeted allowing placement of a 10-French percutaneous nephrostomy catheter with end coiled and locked within the renal pelvis. Contrast injection confirmed appropriate positioning. Under a combination of ultrasound and fluoroscopic guidance, a right-sided posterosuperior calyx was targeted (an inferior calyx was unable to be accessed secondary to angulation of the right kidney as well as patient's somewhat unstable status and inability to lie on the fluoroscopy table) allowing placement of a 10 French percutaneous nephrostomy catheter with end coiled and locked within the renal pelvis. Contrast injection confirmed appropriate positioning. IMPRESSION: Successful ultrasound and fluoroscopic guided placement of bilateral 10 French percutaneous nephrostomy catheters. Note, as above, the left-sided nephrostomy catheter is via a posteroinferior calyx while the right-sided nephrostomy catheter is via a posterosuperior caliceal access. Electronically Signed   By: Sandi Mariscal M.D.   On: 08/20/2021 12:43   IR URETERAL STENT PLACEMENT  EXISTING ACCESS LEFT  Result Date: 08/31/2021 INDICATION: 87 year old with metastatic prostate cancer and bilateral obstructive uropathy. Bilateral percutaneous nephrostomy tubes were placed on 08/19/2021. Patient also has a Foley catheter. Urology has requested conversion to antegrade ureter stents. EXAM: 1. PLACEMENT OF BILATERAL ANTEGRADE URETER STENTS THROUGH EXISTING ACCESS 2. EXCHANGE OF BILATERAL NEPHROSTOMY TUBES MEDICATIONS: Rocephin 1 g; The antibiotic was administered in an appropriate time frame prior to skin puncture. ANESTHESIA/SEDATION: Moderate (conscious) sedation was employed during this procedure. A total of Versed 1.5mg  and fentanyl 75 mcg was administered intravenously at the order of the provider performing the procedure. Total intra-service moderate sedation time: 56 minutes. Patient's level of consciousness and vital signs were monitored continuously by radiology nurse throughout the procedure under the supervision of the provider performing the procedure. CONTRAST:  50 mL Omnipaque 300-administered into the collecting system(s) FLUOROSCOPY TIME:  Fluoroscopy Time: 18 minutes, 48 seconds, 93 mGy COMPLICATIONS: None immediate. PROCEDURE: Informed consent was obtained for placement of antegrade ureter stents. Patient was placed prone on the interventional table. Bilateral nephrostomy tubes were prepped and draped in sterile fashion. Maximal barrier sterile technique was utilized including caps, mask, sterile gowns, sterile gloves, sterile drape, hand hygiene and skin antiseptic. Left nephrostomy tube was injected with contrast. Skin was anesthetized with 1% lidocaine. Catheter was cut and removed over a Bentson wire. Kumpe catheter was advanced to the distal left ureter. Contrast injection demonstrated obstruction at the left ureterovesical junction. A Roadrunner wire was successfully advanced into the urinary bladder. Catheter was advanced over the wire into the urinary bladder. The Foley  catheter was clamped and contrast was injected in the bladder in order to create space around the Foley catheter balloon. Kumpe catheter was removed over a superstiff Amplatz wire. 9 French vascular sheath was placed and a Bentson wire was placed as a safety wire. An 8 [place], 22 cm ureter stent was advanced over the superstiff Amplatz wire and successfully deployed  within the urinary bladder and in the left renal pelvis. 8.5 [place] multipurpose drain was advanced over the safety wire and reconstituted in the renal pelvis. Contrast was injected. The nephrostomy tube was flushed with saline and capped. Catheter was sutured to skin. Attention was directed to the right nephrostomy tube. Nephrostomy tube was injected with contrast. Catheter was cut and removed over a Bentson wire. Kumpe catheter was advanced into the right ureter and there was an obstruction at the right ureterovesical junction. Roadrunner wire was successfully advanced into the urinary bladder and the catheter was advanced into the urinary bladder. Super stiff Amplatz wire was placed. 9 French vascular sheath was placed over the superstiff Amplatz wire and a Bentson wire was placed as a safety wire. 8 [place], 22 cm ureter stent was advanced over the superstiff Amplatz wire and deployed within the urinary bladder and in the right kidney upper pole calyx. 8.5 French nephrostomy tube was placed over the Bentson wire and deployed in the renal pelvis. Nephrostomy tube was flushed and capped. Nephrostomy tube was sutured to skin. FINDINGS: Obstruction at the bilateral ureterovesical junctions. Catheters and wires were successfully advanced beyond the ureterovesical junction obstruction bilaterally. 8 French, 22 cm ureter stents were placed bilaterally. Left ureter stent is positioned in the renal pelvis along with an 8.5 French nephrostomy tube. The right ureter stent is coiled in the right upper pole calyx. Right nephrostomy tube is coiled in the renal  pelvis. IMPRESSION: 1. Successful placement of bilateral antegrade ureter stents. 2. Replacement of bilateral nephrostomy tubes. Both nephrostomy tubes are capped. Nephrostomy tubes can be removed at a later time with fluoroscopy. Electronically Signed   By: Markus Daft M.D.   On: 08/31/2021 18:02   IR URETERAL STENT PLACEMENT EXISTING ACCESS RIGHT  Result Date: 08/31/2021 INDICATION: 87 year old with metastatic prostate cancer and bilateral obstructive uropathy. Bilateral percutaneous nephrostomy tubes were placed on 08/19/2021. Patient also has a Foley catheter. Urology has requested conversion to antegrade ureter stents. EXAM: 1. PLACEMENT OF BILATERAL ANTEGRADE URETER STENTS THROUGH EXISTING ACCESS 2. EXCHANGE OF BILATERAL NEPHROSTOMY TUBES MEDICATIONS: Rocephin 1 g; The antibiotic was administered in an appropriate time frame prior to skin puncture. ANESTHESIA/SEDATION: Moderate (conscious) sedation was employed during this procedure. A total of Versed 1.5mg  and fentanyl 75 mcg was administered intravenously at the order of the provider performing the procedure. Total intra-service moderate sedation time: 56 minutes. Patient's level of consciousness and vital signs were monitored continuously by radiology nurse throughout the procedure under the supervision of the provider performing the procedure. CONTRAST:  50 mL Omnipaque 300-administered into the collecting system(s) FLUOROSCOPY TIME:  Fluoroscopy Time: 18 minutes, 48 seconds, 93 mGy COMPLICATIONS: None immediate. PROCEDURE: Informed consent was obtained for placement of antegrade ureter stents. Patient was placed prone on the interventional table. Bilateral nephrostomy tubes were prepped and draped in sterile fashion. Maximal barrier sterile technique was utilized including caps, mask, sterile gowns, sterile gloves, sterile drape, hand hygiene and skin antiseptic. Left nephrostomy tube was injected with contrast. Skin was anesthetized with 1%  lidocaine. Catheter was cut and removed over a Bentson wire. Kumpe catheter was advanced to the distal left ureter. Contrast injection demonstrated obstruction at the left ureterovesical junction. A Roadrunner wire was successfully advanced into the urinary bladder. Catheter was advanced over the wire into the urinary bladder. The Foley catheter was clamped and contrast was injected in the bladder in order to create space around the Foley catheter balloon. Kumpe catheter was removed over a superstiff Amplatz  wire. 9 French vascular sheath was placed and a Bentson wire was placed as a safety wire. An 8 [place], 22 cm ureter stent was advanced over the superstiff Amplatz wire and successfully deployed within the urinary bladder and in the left renal pelvis. 8.5 [place] multipurpose drain was advanced over the safety wire and reconstituted in the renal pelvis. Contrast was injected. The nephrostomy tube was flushed with saline and capped. Catheter was sutured to skin. Attention was directed to the right nephrostomy tube. Nephrostomy tube was injected with contrast. Catheter was cut and removed over a Bentson wire. Kumpe catheter was advanced into the right ureter and there was an obstruction at the right ureterovesical junction. Roadrunner wire was successfully advanced into the urinary bladder and the catheter was advanced into the urinary bladder. Super stiff Amplatz wire was placed. 9 French vascular sheath was placed over the superstiff Amplatz wire and a Bentson wire was placed as a safety wire. 8 [place], 22 cm ureter stent was advanced over the superstiff Amplatz wire and deployed within the urinary bladder and in the right kidney upper pole calyx. 8.5 French nephrostomy tube was placed over the Bentson wire and deployed in the renal pelvis. Nephrostomy tube was flushed and capped. Nephrostomy tube was sutured to skin. FINDINGS: Obstruction at the bilateral ureterovesical junctions. Catheters and wires were  successfully advanced beyond the ureterovesical junction obstruction bilaterally. 8 French, 22 cm ureter stents were placed bilaterally. Left ureter stent is positioned in the renal pelvis along with an 8.5 French nephrostomy tube. The right ureter stent is coiled in the right upper pole calyx. Right nephrostomy tube is coiled in the renal pelvis. IMPRESSION: 1. Successful placement of bilateral antegrade ureter stents. 2. Replacement of bilateral nephrostomy tubes. Both nephrostomy tubes are capped. Nephrostomy tubes can be removed at a later time with fluoroscopy. Electronically Signed   By: Markus Daft M.D.   On: 08/31/2021 18:02    Lab Data:  CBC: Recent Labs  Lab 08/31/21 0304 08/31/21 1724 09/01/21 0531 09/01/21 1501 09/02/21 0207 09/03/21 0339 09/04/21 0222  WBC 9.6 10.4 10.5  --  10.2 8.7 6.7  NEUTROABS 7.0  --  7.9*  --  7.4 5.8 4.3  HGB 7.4* 7.5* 6.8* 8.3* 8.2* 8.1* 8.0*  HCT 24.3* 24.6* 22.2* 25.5* 26.5* 25.5* 25.5*  MCV 99.6 100.4* 100.9*  --  95.3 95.1 95.9  PLT 86* 85* 89*  --  84* 88* 91*   Basic Metabolic Panel: Recent Labs  Lab 08/30/21 0320 08/31/21 0304 08/31/21 1724 09/01/21 0531 09/01/21 1706 09/02/21 0207 09/05/21 0235  NA 150* 149*  --  144  --  143 141  K 3.9 3.7  --  4.0  --  4.4 4.9  CL 112* 113*  --  112*  --  111 112*  CO2 29 25  --  25  --  21* 19*  GLUCOSE 101* 100*  --  121*  --  96 115*  BUN 31* 33*  --  37*  --  44* 56*  CREATININE 2.83* 3.05*  --  3.00*  --  2.55* 2.02*  CALCIUM 8.4* 8.5*  --  8.4*  --  8.5* 8.2*  MG  --  1.8 1.8 2.0 1.9  --   --   PHOS  --  3.0 3.0 2.4* 2.8  --   --    GFR: Estimated Creatinine Clearance: 26.3 mL/min (A) (by C-G formula based on SCr of 2.02 mg/dL (H)). Liver Function Tests: No results for  input(s): AST, ALT, ALKPHOS, BILITOT, PROT, ALBUMIN in the last 168 hours. No results for input(s): LIPASE, AMYLASE in the last 168 hours. No results for input(s): AMMONIA in the last 168 hours. Coagulation  Profile: Recent Labs  Lab 08/31/21 1724  INR 2.0*   Cardiac Enzymes: No results for input(s): CKTOTAL, CKMB, CKMBINDEX, TROPONINI in the last 168 hours. BNP (last 3 results) No results for input(s): PROBNP in the last 8760 hours. HbA1C: No results for input(s): HGBA1C in the last 72 hours. CBG: Recent Labs  Lab 09/04/21 2002 09/04/21 2355 09/05/21 0420 09/05/21 0801 09/05/21 1105  GLUCAP 121* 113* 102* 113* 103*   Lipid Profile: No results for input(s): CHOL, HDL, LDLCALC, TRIG, CHOLHDL, LDLDIRECT in the last 72 hours. Thyroid Function Tests: No results for input(s): TSH, T4TOTAL, FREET4, T3FREE, THYROIDAB in the last 72 hours. Anemia Panel: No results for input(s): VITAMINB12, FOLATE, FERRITIN, TIBC, IRON, RETICCTPCT in the last 72 hours.  Urine analysis:    Component Value Date/Time   COLORURINE YELLOW 08/17/2021 0034   APPEARANCEUR CLOUDY (A) 08/17/2021 0034   LABSPEC 1.009 08/17/2021 0034   PHURINE 6.0 08/17/2021 0034   GLUCOSEU NEGATIVE 08/17/2021 0034   HGBUR LARGE (A) 08/17/2021 0034   BILIRUBINUR NEGATIVE 08/17/2021 0034   KETONESUR NEGATIVE 08/17/2021 0034   PROTEINUR 100 (A) 08/17/2021 0034   NITRITE NEGATIVE 08/17/2021 0034   LEUKOCYTESUR LARGE (A) 08/17/2021 0034     Manar Smalling M.D. [facility] 09/05/2021, 1:15 PM  Available via Epic secure chat 7am-7pm After 7 pm, please refer to night coverage provider listed on amion.

## 2021-09-05 NOTE — Plan of Care (Signed)
  Problem: Education: Goal: Knowledge of General Education information will improve Description: Including pain rating scale, medication(s)/side effects and non-pharmacologic comfort measures Outcome: Progressing   Problem: Health Behavior/Discharge Planning: Goal: Ability to manage health-related needs will improve Outcome: Progressing   Problem: Clinical Measurements: Goal: Will remain free from infection Outcome: Progressing   

## 2021-09-05 NOTE — Progress Notes (Signed)
Patient ID: Anthony Dudley, male   DOB: 1933/08/26, 87 y.o.   MRN: [id number]  18 Days Post-Op Subjective: Pt not currently responsive. Resting comfortably.  Objective: Vital signs in last 24 hours: Temp:  [97.5 F (36.4 C)-98.5 F (36.9 C)] 98 F (36.7 C) (10/23 0423) Pulse Rate:  [88-100] 88 (10/23 0423) Resp:  [16-24] 24 (10/23 0423) BP: (94-110)/(47-57) 101/47 (10/23 0423) SpO2:  [94 %-100 %] 94 % (10/23 0423) Weight:  [76.5 kg] 76.5 kg (10/23 0427)  Intake/Output from previous day: 10/22 0701 - 10/23 0700 In: 1703.8 [NG/GT:1703.8] Out: 1575 [Urine:1575] Intake/Output this shift: Total I/O In: -  Out: 300 [Urine:300]  Physical Exam:  General: Alert and oriented GU: B PCNs capped, Foley with grossly clear urine  Lab Results: Recent Labs    09/03/21 0339 09/04/21 0222  HGB 8.1* 8.0*  HCT 25.5* 25.5*   BMET Recent Labs    09/05/21 0235  NA 141  K 4.9  CL 112*  CO2 19*  GLUCOSE 115*  BUN 56*  CREATININE 2.02*  CALCIUM 8.2*     Studies/Results: No results found.  Assessment/Plan: Bilateral hydronephrosis/AKI/sepsis:  S/P bilateral antegrade ureteral stent placement.  Renal function continues to improve and is better than known baseline today.  Would recommend IR remove nephrostomy tubes under fluoroscopy tomorrow.  Once nephrostomy tubes are removed, he will be ok for discharge from a urologic perspective. Metastatic prostate cancer: PSA significantly elevated consistent with probable widespread metastatic prostate cancer. CT scan without lymphadenopathy but with extensive sclerotic bone metastases.  Bone scan has confirmed this.  Will need treatment escalation eventually with androgen receptor blockade/androgen synthesis inhibition as outpatient.  Started ADT with degarelix on 10/6.  Will arrange ongoing therapy as an outpatient for early November to continue ADT. Urethral erosion/urinary retention:  Continue Foley for now.  Will consider SP tube placement  eventually (likely at time of next stent change when he gets general anesthesia).   Please call if further questions.  I will work on arranging outpatient follow up.     LOS: 18 days   Dutch Gray 09/05/2021, 12:31 PM

## 2021-09-06 ENCOUNTER — Inpatient Hospital Stay
Admission: AD | Admit: 2021-09-06 | Discharge: 2021-09-10 | Disposition: A | Discharge status: Skilled Nursing Facility | Payer: Medicare Other | Source: Other Acute Inpatient Hospital

## 2021-09-06 ENCOUNTER — Inpatient Hospital Stay (HOSPITAL_COMMUNITY): Payer: Medicare Other

## 2021-09-06 ENCOUNTER — Other Ambulatory Visit (HOSPITAL_COMMUNITY): Payer: Medicare Other

## 2021-09-06 DIAGNOSIS — R1310 Dysphagia, unspecified: Secondary | ICD-10-CM

## 2021-09-06 DIAGNOSIS — N189 Chronic kidney disease, unspecified: Secondary | ICD-10-CM | POA: Diagnosis not present

## 2021-09-06 DIAGNOSIS — T85598A Other mechanical complication of other gastrointestinal prosthetic devices, implants and grafts, initial encounter: Secondary | ICD-10-CM

## 2021-09-06 DIAGNOSIS — N179 Acute kidney failure, unspecified: Secondary | ICD-10-CM | POA: Diagnosis not present

## 2021-09-06 DIAGNOSIS — E538 Deficiency of other specified B group vitamins: Secondary | ICD-10-CM | POA: Diagnosis present

## 2021-09-06 DIAGNOSIS — J9601 Acute respiratory failure with hypoxia: Secondary | ICD-10-CM | POA: Diagnosis not present

## 2021-09-06 DIAGNOSIS — R627 Adult failure to thrive: Secondary | ICD-10-CM | POA: Diagnosis present

## 2021-09-06 DIAGNOSIS — D638 Anemia in other chronic diseases classified elsewhere: Secondary | ICD-10-CM | POA: Diagnosis present

## 2021-09-06 DIAGNOSIS — B419 Paracoccidioidomycosis, unspecified: Secondary | ICD-10-CM

## 2021-09-06 DIAGNOSIS — E870 Hyperosmolality and hypernatremia: Secondary | ICD-10-CM | POA: Diagnosis present

## 2021-09-06 DIAGNOSIS — J189 Pneumonia, unspecified organism: Secondary | ICD-10-CM | POA: Diagnosis present

## 2021-09-06 DIAGNOSIS — D539 Nutritional anemia, unspecified: Secondary | ICD-10-CM | POA: Diagnosis present

## 2021-09-06 DIAGNOSIS — I471 Supraventricular tachycardia: Secondary | ICD-10-CM | POA: Clinically undetermined

## 2021-09-06 HISTORY — PX: IR NEPHRO TUBE REMOV/FL: IMG2342

## 2021-09-06 LAB — GLUCOSE, CAPILLARY
Glucose-Capillary: 103 mg/dL — ABNORMAL HIGH (ref 70–99)
Glucose-Capillary: 114 mg/dL — ABNORMAL HIGH (ref 70–99)
Glucose-Capillary: 114 mg/dL — ABNORMAL HIGH (ref 70–99)
Glucose-Capillary: 116 mg/dL — ABNORMAL HIGH (ref 70–99)
Glucose-Capillary: 130 mg/dL — ABNORMAL HIGH (ref 70–99)

## 2021-09-06 MED ORDER — PROSOURCE TF PO LIQD: 45.0000 mL | Freq: Every day | ORAL | Status: DC

## 2021-09-06 MED ORDER — OSMOLITE 1.2 CAL PO LIQD: 1000.0000 mL | Freq: Two times a day (BID) | ORAL | 0 refills | Status: DC

## 2021-09-06 MED ORDER — METOPROLOL TARTRATE 5 MG/5ML IV SOLN: 5.0000 mg | INTRAVENOUS | Status: DC | PRN

## 2021-09-06 MED ORDER — FREE WATER: 125.0000 mL | Status: DC

## 2021-09-06 MED ORDER — ADULT MULTIVITAMIN W/MINERALS CH: 1.0000 | ORAL_TABLET | Freq: Every day | ORAL | Status: DC

## 2021-09-06 MED ORDER — FOLIC ACID 1 MG PO TABS: 1.0000 mg | ORAL_TABLET | Freq: Every day | ORAL | Status: DC

## 2021-09-06 MED ORDER — FOOD THICKENER (SIMPLYTHICK HONEY): 1.0000 | ORAL | Status: DC | PRN

## 2021-09-06 MED ORDER — MAGNESIUM OXIDE -MG SUPPLEMENT 400 (240 MG) MG PO TABS: 400.0000 mg | ORAL_TABLET | Freq: Two times a day (BID) | ORAL | Status: DC

## 2021-09-06 MED ORDER — ACETAMINOPHEN 325 MG PO TABS
650.0000 mg | ORAL_TABLET | Freq: Four times a day (QID) | ORAL | Status: DC | PRN
Start: 2021-09-06 — End: 2021-12-01

## 2021-09-06 MED ORDER — LORAZEPAM 2 MG/ML IJ SOLN
0.5000 mg | Freq: Four times a day (QID) | INTRAMUSCULAR | 0 refills | Status: DC | PRN
Start: 2021-09-06 — End: 2021-12-01

## 2021-09-06 MED ORDER — OXYBUTYNIN CHLORIDE 5 MG PO TABS: 2.5000 mg | ORAL_TABLET | Freq: Two times a day (BID) | ORAL | Status: DC

## 2021-09-06 NOTE — Progress Notes (Signed)
Speech Language Pathology Treatment: Dysphagia  Patient Details Name: Anthony Dudley MRN: [id number] DOB: 01-29-1933 Today's Date: 09/06/2021 Time: 5188-4166 SLP Time Calculation (min) (ACUTE ONLY): 10 min  Assessment / Plan / Recommendation Clinical Impression  Pt alert today, still adamantly resists all efforts to feed other than sips of thin water. Pt continues to show signs of dysphagia (multiple swallow and cough) that require further instrumental assessment though mentation and participation have been barriers to MBS. Pt now for tx to select specialty hospital with f/u with SLP. Continue to recommend a diet of purees and honey thick liquids, though sips of thin water are also recommended when pt alert and upright with assist.   HPI        SLP Plan  Continue with current plan of care      Recommendations for follow up therapy are one component of a multi-disciplinary discharge planning process, led by the attending physician.  Recommendations may be updated based on patient status, additional functional criteria and insurance authorization.    Recommendations  Diet recommendations: Dysphagia 1 (puree);Honey-thick liquid Liquids provided via: Cup;Straw;Teaspoon Medication Administration: Crushed with puree Supervision: Full supervision/cueing for compensatory strategies Compensations: Slow rate;Small sips/bites Postural Changes and/or Swallow Maneuvers: Seated upright 90 degrees                Oral Care Recommendations: Oral care QID;Oral care prior to ice chip/H20 Follow up Recommendations: 24 hour supervision/assistance SLP Visit Diagnosis: Dysphagia, unspecified (R13.10) Plan: Continue with current plan of care       GO                Liboria Putnam, Katherene Ponto  09/06/2021, 2:13 PM

## 2021-09-06 NOTE — Procedures (Signed)
Interventional Radiology Procedure Note  Procedure: Bilateral antegrade nephrostograms and PCN removal  Indication: Bilateral ureteral obstruction  Findings: Please refer to procedural dictation for full description.  Complications: None  EBL: < 10 mL  Miachel Roux, MD [phone]

## 2021-09-06 NOTE — Care Management Important Message (Signed)
Important Message  Patient Details  Name: Anthony Dudley MRN: [id number] Date of Birth: [birth year]   Medicare Important Message Given:  Yes     Shelda Altes 09/06/2021, 10:10 AM

## 2021-09-06 NOTE — Discharge Summary (Addendum)
Physician Discharge Summary  Anthony Dudley KZL:[id number] DOB: 12/11/1932 DOA: 08/17/2021  PCP: [name], MD  Admit date: 08/17/2021 Discharge date: 09/06/2021  Time spent: 35 minutes  Recommendations for Outpatient Follow-up:  Discharge to [facility] Bilateral nephrostomy tubes will be removed today 10/24 prior to discharge to [facility] Patient will discharge with cortrack tube in place for nocturnal feedings.  Brother considering placing a gastrostomy tube.  Due to anatomy IR unable to pursue a PEG tube.  Likely will need to consult general surgery for placement of gastrostomy tube if indicated. Plan is to continue telemetry monitoring especially in context of history of SVT   Discharge Diagnoses:  Principal Problem:   Acute-on-chronic kidney injury ([place]) Active Problems:   Metabolic acidosis   Sepsis ([place])   UTI (urinary tract infection) due to urinary indwelling catheter (HCC)   Prostate cancer (HCC)   Malnutrition of moderate degree   Bilateral ureteral obstruction   Dysphagia   Adult failure to thrive   Anemia, chronic disease   Folic acid deficiency   Hypernatremia   Acute respiratory failure with hypoxia (HCC)   Bilateral pneumonia   SVT (supraventricular tachycardia) ([place])  SEPSIS RESOLVED  Discharge Condition: Stable   Diet recommendation: Dysphagia 1 diet with honey thick liquids along with nocturnal tube feedings Jevity 1.2 at 70 cc/h  Filed Weights   09/04/21 0315 09/05/21 0427 09/06/21 0044  Weight: 77.8 kg 76.5 kg 76.7 kg    History of present illness:  87 year old male with metastatic prostate CA, chronic indwelling Foley, CKD stage3, MDR, dementia, chronic bedbound status, diastolic CHF, hypertension, multiple other medical problems presented with AKI on CKD from the SNF.  Creatinine reportedly 2.5 on 8/26, 6.1 on 10/3.  He was recently transfused with hemoglobin of 6.6 at [facility] on 9/27. Patient was admitted with sepsis  secondary to UTI and AKI on CKD.  Urology was consulted for hydronephrosis, underwent cystoscopy and fulguration of bladder on 10/5 with failed attempted stent placement.  IR was consulted for bilateral nephrostomy tubes, placed on 10/6.  Hospitalization was complicated by SVT, started on amiodarone.  Cardiology and PCCM were consulted.  Renal function has been gradually improving however patient remains very debilitated, poor oral intake, FTT.    Hospital Course:    Acute-on-chronic kidney injury ([place]) stage IIIa with bilateral hydronephrosis, metabolic acidosis -Baseline creatinine 2.5, presenting creatinine 6.1 -s/p bilateral PCN by IR, urology  -s/p bilateral antegrade ureteral stents and replacement of bilateral nephrostomy tube by IR on 10/18  -Dr. Alinda Money has recommended nephrostomy tubes to be discontinued and IR plans to discontinue today on 10/20   Sepsis secondary to UTI, POA due to chronic indwelling Foley catheter, bilateral multifocal pneumonia with acute hypoxemic respiratory failure -Patient presented with tachypnea, tachycardia, leukocytosis.  Prior history of MDR Proteus, was treated with cefepime, vancomycin, completed 7 days to cover both UTI and pneumonia. -Foley was exchanged in ED by Dr. Alinda Money -Stable on room air after appropriate treatment of pneumonia as above     Malnutrition of moderate degree, failure to thrive, dysphagia -Cortrack tube placed and currently tolerating feedings w/ supplements -Discussed in detail with patient's brother (HCPOA) regarding PEG tube.  He is aware that decision can be delayed since patient will be discharging to [facility] and not to SNF.  He wishes to allow patient to try to eat for at least 1 more week before making decision regarding feeding tube -10/21 discussed with IR and due to patient's anatomy after review of CT,  IR states that if feeding tube desired will need to consult GI or surgery -Brother states that for several months prior to  admission the patient has not been eating and he became concerned when the patient would not attempt to drink a milkshake which is one of his favorite treats. -SLP recommending D1 diet with honey thick liquids and continued follow-up   Acute on chronic anemia of blood loss, folic acid deficiency -Multiple chronic illness, hematuria, baseline ~7.5 -Transfused 1 unit on 10/19 for hemoglobin of 6.8-current hemoglobin 8.0 -Folic acid low 6.3-ZCHYIFOY enteral replacement   Hypernatremia -Resolved -Due to poor oral intake, FTT, started on cortrack feeding -Sodium 141 on 10/22-continue Free water   SVT -Treated with amiodarone now discontinued -Maintaining sinus rhythm -TSH normal    Procedures: Cystoscopy Bilateral PCN Placement of bilateral antegrade ureteral stents Removal of bilateral percutaneous nephrostomy tubes 10/24  Consultations: Urology PCCM Cardiology Palliative medicine IR   Discharge Exam: Vitals:   09/06/21 0724 09/06/21 0725  BP:  (!) 119/51  Pulse:    Resp:  (!) 32  Temp: 98.7 F (37.1 C)   SpO2:     Constitutional: NAD, calm, comfortable.  Much more alert than on Friday.  Discussed feeding tubes and swallowing.  Patient stated to do what ever his brother said to do. Respiratory: Anterior lung sounds clear to auscultation, no increased work of breathing.  Room air.  O2 saturations between 94 and 100% Cardiovascular: S1-S2.  Pulse regular.  Normotensive.  No peripheral edema.  Skin pale but warm and dry. Abdomen: Normoactive bowel sounds.  Nondistended.  Cortrack tube in place with nocturnal tube feedings infusing. LBM 10/22 Neurologic: CN 2-12 grossly intact. Sensation intact, patient extremely weak in all extremities. Psychiatric: Awake.  Having difficulty talking due to dry mouth.  Laughed when I compared talking with the dry mouth to try to talk after eating peanut butter  Discharge Instructions   Discharge Instructions     Diet general   Complete  by: As directed    Dysphagia 1 with honey thick liquids in addition to nocturnal tube feeding   Increase activity slowly   Complete by: As directed    No wound care   Complete by: As directed       Allergies as of 09/06/2021       Reactions   Solanum Lycopersicum [tomato]    Patient reports he is not allergic to tomato and he eats them all the time        Medication List     STOP taking these medications    metoprolol tartrate 25 MG tablet Commonly known as: LOPRESSOR Replaced by: metoprolol tartrate 5 MG/5ML Soln injection   Multi-Vitamin/Iron Tabs   niacinamide 500 MG tablet   OXYGEN   sodium bicarbonate 650 MG tablet   tamsulosin 0.4 MG Caps capsule Commonly known as: FLOMAX   tolterodine 1 MG tablet Commonly known as: DETROL       TAKE these medications    acetaminophen 325 MG tablet Commonly known as: TYLENOL Take 2 tablets (650 mg total) by mouth every 6 (six) hours as needed for mild pain or fever.   feeding supplement (OSMOLITE 1.2 CAL) Liqd Place 1,000 mLs into feeding tube every 12 (twelve) hours. What changed: You were already taking a medication with the same name, and this prescription was added. Make sure you understand how and when to take each.   feeding supplement (PROSource TF) liquid Place 45 mLs into feeding tube daily. Start taking  on: September 07, 2021 What changed:  how much to take how to take this when to take this   folic acid 1 MG tablet Commonly known as: FOLVITE Place 1 tablet (1 mg total) into feeding tube daily. Start taking on: September 07, 2021   food thickener Liqd Commonly known as: SIMPLYTHICK (HONEY/LEVEL 3/MODERATELY THICK) Take 1 packet by mouth as needed.   free water Soln Place 125 mLs into feeding tube every 4 (four) hours.   ipratropium-albuterol 0.5-2.5 (3) MG/3ML Soln Commonly known as: DUONEB Take 3 mLs by nebulization every 4 (four) hours as needed (shortness of breath).   LORazepam 2 MG/ML  injection Commonly known as: ATIVAN Inject 0.25 mLs (0.5 mg total) into the vein every 6 (six) hours as needed for sedation or anxiety.   magnesium oxide 400 (240 Mg) MG tablet Commonly known as: MAG-OX Take 1 tablet (400 mg total) by mouth 2 (two) times daily.   metoprolol tartrate 5 MG/5ML Soln injection Commonly known as: LOPRESSOR Inject 5 mLs (5 mg total) into the vein every 5 (five) minutes as needed (sustained HR >536, hold for systolic BP less than 144). Replaces: metoprolol tartrate 25 MG tablet   multivitamin with minerals Tabs tablet Place 1 tablet into feeding tube daily. Start taking on: September 07, 2021   oxybutynin 5 MG tablet Commonly known as: DITROPAN Place 0.5 tablets (2.5 mg total) into feeding tube 2 (two) times daily.   vitamin B-12 1000 MCG tablet Commonly known as: CYANOCOBALAMIN Take 2,000 mcg by mouth daily.   Vitamin D-3 125 MCG (5000 UT) Tabs Take 5,000 Units by mouth daily.       Allergies  Allergen Reactions   Solanum Lycopersicum [Tomato]     Patient reports he is not allergic to tomato and he eats them all the time      The results of significant diagnostics from this hospitalization (including imaging, microbiology, ancillary and laboratory) are listed below for reference.    Significant Diagnostic Studies: CT ABDOMEN PELVIS WO CONTRAST  Result Date: 08/19/2021 CLINICAL DATA:  Acute renal failure.  Bilateral hydronephrosis. EXAM: CT ABDOMEN AND PELVIS WITHOUT CONTRAST TECHNIQUE: Multidetector CT imaging of the abdomen and pelvis was performed following the standard protocol without IV contrast. COMPARISON:  12/07/2020 FINDINGS: Lower chest: Trace bilateral pleural effusions. Hepatobiliary: No focal liver abnormality is seen. No gallstones, gallbladder wall thickening, or biliary dilatation. Pancreas: Unremarkable. No pancreatic ductal dilatation or surrounding inflammatory changes. Spleen: Normal in size without focal abnormality.  Adrenals/Urinary Tract: Adrenal glands are unremarkable. Moderate bilateral hydronephrosis and hydroureter. 6 mm nonobstructing calculus of the lower pole of the right kidney. 8 mm nonobstructing calculus at the lower pole of the left kidney. 4 mm calculus within the dependent portion of the bladder. The bladder is collapsed around a Foley catheter balloon which limits evaluation. Stomach/Bowel: No bowel dilatation to indicate ileus or obstruction. Extensive sigmoid and descending colon diverticulosis without evidence of acute diverticulitis. Vascular/Lymphatic: Atherosclerotic calcifications seen throughout the abdominal aorta without aneurysm. No enlarged abdominal pelvic lymph nodes. Reproductive: Calcifications noted within the prostate. Other: No abdominal wall hernia or abnormality. No abdominopelvic ascites. Musculoskeletal: Diffuse osteoblastic metastatic disease again seen. Unchanged moderate to severe compression deformity of the L1 vertebral body. IMPRESSION: Moderate bilateral hydronephrosis and hydroureter. Bilateral nonobstructing renal calculi. Electronically Signed   By: Miachel Roux M.D.   On: 08/19/2021 13:43   DG Abd 1 View  Result Date: 08/30/2021 CLINICAL DATA:  Feeding tube placement. EXAM: ABDOMEN - 1 VIEW  COMPARISON:  None. FINDINGS: Small amount 10 mm Gastrografin administered through feeding tube. Feeding tube tip appears coiled in the region of the stomach. 1 low resolution intraoperative spot views of the stomach were obtained. No fracture visible on the limited views. Total fluoroscopy time: 3 minutes 24 seconds IMPRESSION: 1. Small amount of Gastrografin identified in the stomach. Feeding tube tip coiled in the stomach. Electronically Signed   By: Ronney Asters M.D.   On: 08/30/2021 16:53   DG Abd 1 View  Result Date: 08/28/2021 CLINICAL DATA:  Shortness of breath, nausea, vomiting. EXAM: ABDOMEN - 1 VIEW COMPARISON:  CT abdomen dated 08/19/2021. FINDINGS: New bilateral  percutaneous nephrostomy tubes in place. No dilated large or small bowel loops are identified. Patient's RIGHT hand obscures visualization of the RIGHT lower quadrant. Blastic lesions throughout the visualized osseous structures, corresponding to previously described metastatic disease. IMPRESSION: 1. No acute findings. Nonobstructive bowel gas pattern. 2. Bilateral percutaneous nephrostomy tubes in place. 3. Diffuse osseous metastases, as previously described. Electronically Signed   By: Franki Cabot M.D.   On: 08/28/2021 10:20   NM Bone Scan Whole Body  Result Date: 08/30/2021 CLINICAL DATA:  History of metastatic prostate cancer, staging. EXAM: NUCLEAR MEDICINE WHOLE BODY BONE SCAN TECHNIQUE: Whole body anterior and posterior images were obtained approximately 3 hours after intravenous injection of radiopharmaceutical. RADIOPHARMACEUTICALS:  20.7 mCi Technetium-59m MDP IV COMPARISON:  CT August 19, 2021 FINDINGS: Bilateral kidneys are not visualized. There is abnormal osseous uptake throughout the axial and appendicular skeleton for instance involving the sternum, ribs, spine, pelvis and femurs. Additional abnormal multifocal radiotracer uptake involving the elbows, wrists, hands, knees, ankles and feet in a pattern most consistent with degenerative arthropathy. Photopenic defect in the distal femur and proximal tibia at the left knee likely reflects a knee arthroplasty. IMPRESSION: Scintigraphic findings consistent with extensive osteoblastic metastatic disease involving the axial and appendicular skeleton. Electronically Signed   By: Dahlia Bailiff M.D.   On: 08/30/2021 17:35   DG Cystogram  Result Date: 08/19/2021 CLINICAL DATA:  Surgery, elective. EXAM: CYSTOGRAM TECHNIQUE: A single intraprocedural fluoroscopic image from reported cystoscopy and bladder fulguration is submitted. FLUOROSCOPY TIME:  Fluoroscopy Time:  14 seconds COMPARISON:  Renal ultrasound 08/18/2021. CT abdomen/pelvis 06/06/2021.  FINDINGS: A single intraprocedural fluoroscopic image of the pelvis from reported cystoscopy and bladder fulguration is submitted. On the provided image, a cystoscope projects in the region of the pelvis. Correlate with the procedural history. IMPRESSION: Single intraprocedural fluoroscopic image of the pelvis from reported cystoscopy and bladder fulguration. Please correlate with the procedural history. Electronically Signed   By: Kellie Simmering D.O.   On: 08/19/2021 09:36   US Renal  Result Date: 08/18/2021 CLINICAL DATA:  Renal failure EXAM: RENAL / URINARY TRACT ULTRASOUND COMPLETE COMPARISON:  06/06/2021 FINDINGS: Right Kidney: Renal measurements: 10.2 x 5.3 x 5.3 cm. = volume: 150 mL. Moderate hydronephrosis is noted. This has progressed slightly in the interval from the prior CT. Previously seen right renal calculus is not well appreciated on today's exam. Left Kidney: Renal measurements: 9.8 x 5.5 x 4.5 cm. = volume: 128 mL. Moderate left-sided hydronephrosis is noted as well stable in appearance from prior CT. Previously seen renal calculus is not well appreciated on today's exam. Bladder: Foley catheter is noted within the bladder. Other: None. IMPRESSION: Bilateral hydronephrosis left slightly greater than right similar to that seen on prior CT examination. Previously noted renal calculi bilaterally are not well appreciated on today's exam. Electronically Signed  By: Inez Catalina M.D.   On: 08/18/2021 00:30   DG CHEST PORT 1 VIEW  Result Date: 08/26/2021 CLINICAL DATA:  Shortness of breath. EXAM: PORTABLE CHEST 1 VIEW COMPARISON:  August 21, 2021 FINDINGS: Mild, bilateral multifocal infiltrates are seen. This is decreased in severity when compared to the prior study. There is no evidence of a pleural effusion or pneumothorax. The heart size and mediastinal contours are within normal limits. Moderate to marked severity calcification the aortic arch is noted. Diffusely sclerotic osseous structures  are seen. IMPRESSION: Mild, bilateral multifocal infiltrates, decreased in severity when compared to the prior study. Electronically Signed   By: Virgina Norfolk M.D.   On: 08/26/2021 20:48   DG Chest Port 1 View  Result Date: 08/21/2021 CLINICAL DATA:  87 year old male with history of abnormal respiration. EXAM: PORTABLE CHEST 1 VIEW COMPARISON:  Chest x-ray 08/20/2021. FINDINGS: Lung volumes are low. Diffuse interstitial prominence and patchy ill-defined and nodular opacities are noted throughout the lungs bilaterally. Small left pleural effusion. No definite right pleural effusion. No appreciable pneumothorax. Pulmonary vasculature does not appearing origin. Heart size is normal. The patient is rotated to the left on today's exam, resulting in distortion of the mediastinal contours and reduced diagnostic sensitivity and specificity for mediastinal pathology. Atherosclerotic calcifications in the thoracic aorta. IMPRESSION: 1. Severe multilobar bilateral bronchopneumonia again noted. 2. Small left pleural effusion. 3. Aortic atherosclerosis. Electronically Signed   By: Vinnie Langton M.D.   On: 08/21/2021 10:25   DG CHEST PORT 1 VIEW  Result Date: 08/20/2021 CLINICAL DATA:  Hypoxia, respiratory failure, confusion. EXAM: PORTABLE CHEST 1 VIEW COMPARISON:  Chest radiograph 08/19/2021, CT abdomen pelvis 08/19/2021 FINDINGS: Diffuse hazy interstitial thickening and prominence of the perihilar vasculature. Unchanged retrocardiac left lower lobe opacity. No pneumothorax. Stable cardiomediastinal silhouette. Aortic calcifications. Numerous sclerotic osseous lesions, particularly within thoracolumbar spine, better appreciated on yesterday's cross-sectional imaging. IMPRESSION: Mild pulmonary edema. Retrocardiac left lower lobe opacity may represent combination of pleural fluid and atelectasis, though pneumonia is not excluded. Electronically Signed   By: Ileana Roup M.D.   On: 08/20/2021 09:32   DG CHEST  PORT 1 VIEW  Result Date: 08/19/2021 CLINICAL DATA:  Hypoxia. EXAM: PORTABLE CHEST 1 VIEW COMPARISON:  08/17/2021 FINDINGS: The cardiomediastinal silhouette is unchanged with normal heart size. Aortic atherosclerosis is noted. Lung volumes are low there are increasing patchy airspace opacities throughout the left lung with milder airspace opacities on the right. There is persistent elevation of the left hemidiaphragm. No large pleural effusion or pneumothorax is identified although the patient's chin partially obscures the lung apices. Widespread sclerotic bone metastases are again noted. IMPRESSION: Increasing left greater than right lung airspace opacities concerning for pneumonia. Electronically Signed   By: Logan Bores M.D.   On: 08/19/2021 14:58   DG Chest Port 1 View  Result Date: 08/17/2021 CLINICAL DATA:  Shortness of breath EXAM: PORTABLE CHEST 1 VIEW COMPARISON:  06/06/2021, CT 06/06/2021 FINDINGS: Chronic scarring in the left upper lobe. No acute airspace disease or pleural effusion. Stable cardiomediastinal silhouette with aortic atherosclerosis. Patchy bilateral skeletal sclerosis consistent with osseous metastatic disease. IMPRESSION: 1. Scarring in the left upper lobe.  No acute airspace disease. 2. Diffuse rib sclerosis consistent with skeletal metastatic disease Electronically Signed   By: Donavan Foil M.D.   On: 08/17/2021 23:41   ECHOCARDIOGRAM COMPLETE  Result Date: 08/21/2021    ECHOCARDIOGRAM REPORT   Patient Name:   Anthony Dudley Date of Exam: 08/21/2021 Medical Rec #:  [id number]      Height:       69.0 in Accession #:    [id number]     Weight:       180.8 lb Date of Birth:  12-01-1932      BSA:          1.979 m Patient Age:    15 years       BP:           122/78 mmHg Patient Gender: M              HR:           110 bpm. Exam Location:  Inpatient Procedure: 2D Echo, Color Doppler and Cardiac Doppler Indications:    Ventricular Tachycardia I47.2  History:        Patient has no prior  history of Echocardiogram examinations.  Sonographer:    Bernadene Person RDCS Referring Phys: [id number] [place] Comments: Image acquisition challenging due to patient behavioral factors. and Image acquisition challenging due to uncooperative patient. IMPRESSIONS  1. Left ventricular ejection fraction, by estimation, is 55 to 60%. The left ventricle has normal function. The left ventricle has no regional wall motion abnormalities. There is mild left ventricular hypertrophy. Left ventricular diastolic parameters are indeterminate.  2. Right ventricular systolic function was not well visualized. The right ventricular size is not well visualized. There is normal pulmonary artery systolic pressure.  3. The mitral valve is normal in structure. Trivial mitral valve regurgitation. No evidence of mitral stenosis.  4. The aortic valve was not well visualized. Aortic valve regurgitation is not visualized. Mild to moderate aortic valve sclerosis/calcification is present, without any evidence of aortic stenosis. FINDINGS  Left Ventricle: Left ventricular ejection fraction, by estimation, is 55 to 60%. The left ventricle has normal function. The left ventricle has no regional wall motion abnormalities. The left ventricular internal cavity size was normal in size. There is  mild left ventricular hypertrophy. Left ventricular diastolic parameters are indeterminate. Right Ventricle: The right ventricular size is not well visualized. Right vetricular wall thickness was not well visualized. Right ventricular systolic function was not well visualized. There is normal pulmonary artery systolic pressure. The tricuspid regurgitant velocity is 2.36 m/s, and with an assumed right atrial pressure of 3 mmHg, the estimated right ventricular systolic pressure is 55.7 mmHg. Left Atrium: Left atrial size was normal in size. Right Atrium: Right atrial size was normal in size. Pericardium: There is no evidence of pericardial  effusion. Mitral Valve: The mitral valve is normal in structure. Trivial mitral valve regurgitation. No evidence of mitral valve stenosis. Tricuspid Valve: The tricuspid valve is normal in structure. Tricuspid valve regurgitation is trivial. Aortic Valve: The aortic valve was not well visualized. Aortic valve regurgitation is not visualized. Mild to moderate aortic valve sclerosis/calcification is present, without any evidence of aortic stenosis. Pulmonic Valve: The pulmonic valve was not well visualized. Pulmonic valve regurgitation is not visualized. Aorta: The aortic root and ascending aorta are structurally normal, with no evidence of dilitation. IAS/Shunts: The interatrial septum was not well visualized.  LEFT VENTRICLE PLAX 2D LVIDd:         3.60 cm LVIDs:         2.60 cm LV PW:         0.90 cm LV IVS:        1.10 cm LVOT diam:     2.20 cm LV SV:         71  LV SV Index:   36 LVOT Area:     3.80 cm  RIGHT VENTRICLE TAPSE (M-mode): 1.6 cm LEFT ATRIUM             Index        RIGHT ATRIUM           Index LA diam:        3.10 cm 1.57 cm/m   RA Area:     11.30 cm LA Vol (A2C):   30.6 ml 15.46 ml/m  RA Volume:   22.60 ml  11.42 ml/m LA Vol (A4C):   31.3 ml 15.81 ml/m LA Biplane Vol: 31.1 ml 15.71 ml/m  AORTIC VALVE LVOT Vmax:   111.00 cm/s LVOT Vmean:  71.000 cm/s LVOT VTI:    0.187 m  AORTA Ao Asc diam: 3.50 cm TRICUSPID VALVE TR Peak grad:   22.3 mmHg TR Vmax:        236.00 cm/s  SHUNTS Systemic VTI:  0.19 m Systemic Diam: 2.20 cm Dani Gobble Croitoru MD Electronically signed by Sanda Klein MD Signature Date/Time: 08/21/2021/1:58:30 PM    Final    IR NEPHROSTOMY PLACEMENT LEFT  Result Date: 08/20/2021 INDICATION: History of metastatic prostate cancer, now with bilateral obstructive uropathy. Please perform placement of bilateral nephrostomy catheters for urinary diversion purposes. EXAM: ULTRASOUND AND FLUOROSCOPIC GUIDED PLACEMENT OF BILATERAL NEPHROSTOMY TUBES COMPARISON:  CT abdomen pelvis-08/19/2021  MEDICATIONS: Patient is currently admitted to the hospital receiving intravenous antibiotics.; The antibiotic was administered in an appropriate time frame prior to skin puncture. ANESTHESIA/SEDATION: Moderate (conscious) sedation was employed during this procedure, administered by the interventional radiology RN. A total of Versed 0.5 mg was administered intravenously. Moderate Sedation Time: 25 minutes. The patient's level of consciousness and vital signs were monitored continuously by radiology nursing throughout the procedure under my direct supervision. CONTRAST:  40 mL Omnipaque 350-administered into both renal collecting systems. FLUOROSCOPY TIME:  1 minute, 48 seconds (1.3 mGy) COMPLICATIONS: None immediate. PROCEDURE: The procedure, risks, benefits, and alternatives were explained to the the patient's family, questions were encouraged and answered and informed consent was obtained. A timeout was performed prior to the initiation of the procedure. The operative sites were prepped and draped in the usual sterile fashion and a sterile drape was applied covering the operative field. A sterile gown and sterile gloves were used for the procedure. Local anesthesia was provided with 1% Lidocaine with epinephrine. Beginning with the left kidney, ultrasound was used to localize the left kidney. Under direct ultrasound guidance, a 20 gauge needle was advanced into the renal collecting system. An ultrasound image documentation was performed. Access within the collecting system was confirmed with the efflux of urine followed by limited contrast injection. Under intermittent fluoroscopic guidance, an 0.018 wire was advanced into the collecting system and the tract was dilated with an Accustick stent. Next, over a short Amplatz wire, the track was further dilated ultimately allowing placement of a 10-French percutaneous nephrostomy catheter with end coiled and locked within the renal pelvis. Contrast was injected and  several spot fluoroscopic images were obtained in various obliquities. The contralateral procedure was repeated for the right-sided nephrostomy catheter however note, an upper pole access was acquired secondary to angulation of the right kidney with the inferior pole being located deep and medial with poor sonographic visualization, accentuated due to patient's medical instability and inability to lie prone on the fluoroscopy table. Ultimately, under ultrasound fluoroscopic guidance, a 10 French nephrostomy catheter was placed via a posterosuperior calyx with end  coiled and locked within the right renal pelvis. Contrast was injected several spot fluoroscopic images were obtained in various obliquities Both catheters were secured at the skin entrance site within interrupted sutures and StatLock devices. Both nephrostomies were connected to gravity bags. Dressings were applied. The patient tolerated procedure well without immediate postprocedural complication. FINDINGS: Ultrasound scanning demonstrates a moderate dilated bilateral collecting systems. Under a combination of ultrasound and fluoroscopic guidance, a left-sided posterior inferior calix was targeted allowing placement of a 10-French percutaneous nephrostomy catheter with end coiled and locked within the renal pelvis. Contrast injection confirmed appropriate positioning. Under a combination of ultrasound and fluoroscopic guidance, a right-sided posterosuperior calyx was targeted (an inferior calyx was unable to be accessed secondary to angulation of the right kidney as well as patient's somewhat unstable status and inability to lie on the fluoroscopy table) allowing placement of a 10 French percutaneous nephrostomy catheter with end coiled and locked within the renal pelvis. Contrast injection confirmed appropriate positioning. IMPRESSION: Successful ultrasound and fluoroscopic guided placement of bilateral 10 French percutaneous nephrostomy catheters. Note,  as above, the left-sided nephrostomy catheter is via a posteroinferior calyx while the right-sided nephrostomy catheter is via a posterosuperior caliceal access. Electronically Signed   By: Sandi Mariscal M.D.   On: 08/20/2021 12:43   IR NEPHROSTOMY PLACEMENT RIGHT  Result Date: 08/20/2021 INDICATION: History of metastatic prostate cancer, now with bilateral obstructive uropathy. Please perform placement of bilateral nephrostomy catheters for urinary diversion purposes. EXAM: ULTRASOUND AND FLUOROSCOPIC GUIDED PLACEMENT OF BILATERAL NEPHROSTOMY TUBES COMPARISON:  CT abdomen pelvis-08/19/2021 MEDICATIONS: Patient is currently admitted to the hospital receiving intravenous antibiotics.; The antibiotic was administered in an appropriate time frame prior to skin puncture. ANESTHESIA/SEDATION: Moderate (conscious) sedation was employed during this procedure, administered by the interventional radiology RN. A total of Versed 0.5 mg was administered intravenously. Moderate Sedation Time: 25 minutes. The patient's level of consciousness and vital signs were monitored continuously by radiology nursing throughout the procedure under my direct supervision. CONTRAST:  40 mL Omnipaque 350-administered into both renal collecting systems. FLUOROSCOPY TIME:  1 minute, 48 seconds (1.3 mGy) COMPLICATIONS: None immediate. PROCEDURE: The procedure, risks, benefits, and alternatives were explained to the the patient's family, questions were encouraged and answered and informed consent was obtained. A timeout was performed prior to the initiation of the procedure. The operative sites were prepped and draped in the usual sterile fashion and a sterile drape was applied covering the operative field. A sterile gown and sterile gloves were used for the procedure. Local anesthesia was provided with 1% Lidocaine with epinephrine. Beginning with the left kidney, ultrasound was used to localize the left kidney. Under direct ultrasound guidance,  a 20 gauge needle was advanced into the renal collecting system. An ultrasound image documentation was performed. Access within the collecting system was confirmed with the efflux of urine followed by limited contrast injection. Under intermittent fluoroscopic guidance, an 0.018 wire was advanced into the collecting system and the tract was dilated with an Accustick stent. Next, over a short Amplatz wire, the track was further dilated ultimately allowing placement of a 10-French percutaneous nephrostomy catheter with end coiled and locked within the renal pelvis. Contrast was injected and several spot fluoroscopic images were obtained in various obliquities. The contralateral procedure was repeated for the right-sided nephrostomy catheter however note, an upper pole access was acquired secondary to angulation of the right kidney with the inferior pole being located deep and medial with poor sonographic visualization, accentuated due to patient's medical  instability and inability to lie prone on the fluoroscopy table. Ultimately, under ultrasound fluoroscopic guidance, a 10 French nephrostomy catheter was placed via a posterosuperior calyx with end coiled and locked within the right renal pelvis. Contrast was injected several spot fluoroscopic images were obtained in various obliquities Both catheters were secured at the skin entrance site within interrupted sutures and StatLock devices. Both nephrostomies were connected to gravity bags. Dressings were applied. The patient tolerated procedure well without immediate postprocedural complication. FINDINGS: Ultrasound scanning demonstrates a moderate dilated bilateral collecting systems. Under a combination of ultrasound and fluoroscopic guidance, a left-sided posterior inferior calix was targeted allowing placement of a 10-French percutaneous nephrostomy catheter with end coiled and locked within the renal pelvis. Contrast injection confirmed appropriate positioning.  Under a combination of ultrasound and fluoroscopic guidance, a right-sided posterosuperior calyx was targeted (an inferior calyx was unable to be accessed secondary to angulation of the right kidney as well as patient's somewhat unstable status and inability to lie on the fluoroscopy table) allowing placement of a 10 French percutaneous nephrostomy catheter with end coiled and locked within the renal pelvis. Contrast injection confirmed appropriate positioning. IMPRESSION: Successful ultrasound and fluoroscopic guided placement of bilateral 10 French percutaneous nephrostomy catheters. Note, as above, the left-sided nephrostomy catheter is via a posteroinferior calyx while the right-sided nephrostomy catheter is via a posterosuperior caliceal access. Electronically Signed   By: Sandi Mariscal M.D.   On: 08/20/2021 12:43   IR URETERAL STENT PLACEMENT EXISTING ACCESS LEFT  Result Date: 08/31/2021 INDICATION: 87 year old with metastatic prostate cancer and bilateral obstructive uropathy. Bilateral percutaneous nephrostomy tubes were placed on 08/19/2021. Patient also has a Foley catheter. Urology has requested conversion to antegrade ureter stents. EXAM: 1. PLACEMENT OF BILATERAL ANTEGRADE URETER STENTS THROUGH EXISTING ACCESS 2. EXCHANGE OF BILATERAL NEPHROSTOMY TUBES MEDICATIONS: Rocephin 1 g; The antibiotic was administered in an appropriate time frame prior to skin puncture. ANESTHESIA/SEDATION: Moderate (conscious) sedation was employed during this procedure. A total of Versed 1.5mg  and fentanyl 75 mcg was administered intravenously at the order of the provider performing the procedure. Total intra-service moderate sedation time: 56 minutes. Patient's level of consciousness and vital signs were monitored continuously by radiology nurse throughout the procedure under the supervision of the provider performing the procedure. CONTRAST:  50 mL Omnipaque 300-administered into the collecting system(s) FLUOROSCOPY  TIME:  Fluoroscopy Time: 18 minutes, 48 seconds, 93 mGy COMPLICATIONS: None immediate. PROCEDURE: Informed consent was obtained for placement of antegrade ureter stents. Patient was placed prone on the interventional table. Bilateral nephrostomy tubes were prepped and draped in sterile fashion. Maximal barrier sterile technique was utilized including caps, mask, sterile gowns, sterile gloves, sterile drape, hand hygiene and skin antiseptic. Left nephrostomy tube was injected with contrast. Skin was anesthetized with 1% lidocaine. Catheter was cut and removed over a Bentson wire. Kumpe catheter was advanced to the distal left ureter. Contrast injection demonstrated obstruction at the left ureterovesical junction. A Roadrunner wire was successfully advanced into the urinary bladder. Catheter was advanced over the wire into the urinary bladder. The Foley catheter was clamped and contrast was injected in the bladder in order to create space around the Foley catheter balloon. Kumpe catheter was removed over a superstiff Amplatz wire. 9 French vascular sheath was placed and a Bentson wire was placed as a safety wire. An 8 [place], 22 cm ureter stent was advanced over the superstiff Amplatz wire and successfully deployed within the urinary bladder and in the left renal pelvis. 8.5 [place] multipurpose  drain was advanced over the safety wire and reconstituted in the renal pelvis. Contrast was injected. The nephrostomy tube was flushed with saline and capped. Catheter was sutured to skin. Attention was directed to the right nephrostomy tube. Nephrostomy tube was injected with contrast. Catheter was cut and removed over a Bentson wire. Kumpe catheter was advanced into the right ureter and there was an obstruction at the right ureterovesical junction. Roadrunner wire was successfully advanced into the urinary bladder and the catheter was advanced into the urinary bladder. Super stiff Amplatz wire was placed. 9 French vascular  sheath was placed over the superstiff Amplatz wire and a Bentson wire was placed as a safety wire. 8 [place], 22 cm ureter stent was advanced over the superstiff Amplatz wire and deployed within the urinary bladder and in the right kidney upper pole calyx. 8.5 French nephrostomy tube was placed over the Bentson wire and deployed in the renal pelvis. Nephrostomy tube was flushed and capped. Nephrostomy tube was sutured to skin. FINDINGS: Obstruction at the bilateral ureterovesical junctions. Catheters and wires were successfully advanced beyond the ureterovesical junction obstruction bilaterally. 8 French, 22 cm ureter stents were placed bilaterally. Left ureter stent is positioned in the renal pelvis along with an 8.5 French nephrostomy tube. The right ureter stent is coiled in the right upper pole calyx. Right nephrostomy tube is coiled in the renal pelvis. IMPRESSION: 1. Successful placement of bilateral antegrade ureter stents. 2. Replacement of bilateral nephrostomy tubes. Both nephrostomy tubes are capped. Nephrostomy tubes can be removed at a later time with fluoroscopy. Electronically Signed   By: Markus Daft M.D.   On: 08/31/2021 18:02   IR URETERAL STENT PLACEMENT EXISTING ACCESS RIGHT  Result Date: 08/31/2021 INDICATION: 87 year old with metastatic prostate cancer and bilateral obstructive uropathy. Bilateral percutaneous nephrostomy tubes were placed on 08/19/2021. Patient also has a Foley catheter. Urology has requested conversion to antegrade ureter stents. EXAM: 1. PLACEMENT OF BILATERAL ANTEGRADE URETER STENTS THROUGH EXISTING ACCESS 2. EXCHANGE OF BILATERAL NEPHROSTOMY TUBES MEDICATIONS: Rocephin 1 g; The antibiotic was administered in an appropriate time frame prior to skin puncture. ANESTHESIA/SEDATION: Moderate (conscious) sedation was employed during this procedure. A total of Versed 1.5mg  and fentanyl 75 mcg was administered intravenously at the order of the provider performing the procedure.  Total intra-service moderate sedation time: 56 minutes. Patient's level of consciousness and vital signs were monitored continuously by radiology nurse throughout the procedure under the supervision of the provider performing the procedure. CONTRAST:  50 mL Omnipaque 300-administered into the collecting system(s) FLUOROSCOPY TIME:  Fluoroscopy Time: 18 minutes, 48 seconds, 93 mGy COMPLICATIONS: None immediate. PROCEDURE: Informed consent was obtained for placement of antegrade ureter stents. Patient was placed prone on the interventional table. Bilateral nephrostomy tubes were prepped and draped in sterile fashion. Maximal barrier sterile technique was utilized including caps, mask, sterile gowns, sterile gloves, sterile drape, hand hygiene and skin antiseptic. Left nephrostomy tube was injected with contrast. Skin was anesthetized with 1% lidocaine. Catheter was cut and removed over a Bentson wire. Kumpe catheter was advanced to the distal left ureter. Contrast injection demonstrated obstruction at the left ureterovesical junction. A Roadrunner wire was successfully advanced into the urinary bladder. Catheter was advanced over the wire into the urinary bladder. The Foley catheter was clamped and contrast was injected in the bladder in order to create space around the Foley catheter balloon. Kumpe catheter was removed over a superstiff Amplatz wire. 9 French vascular sheath was placed and a Bentson wire was placed  as a safety wire. An 8 [place], 22 cm ureter stent was advanced over the superstiff Amplatz wire and successfully deployed within the urinary bladder and in the left renal pelvis. 8.5 [place] multipurpose drain was advanced over the safety wire and reconstituted in the renal pelvis. Contrast was injected. The nephrostomy tube was flushed with saline and capped. Catheter was sutured to skin. Attention was directed to the right nephrostomy tube. Nephrostomy tube was injected with contrast. Catheter was cut and  removed over a Bentson wire. Kumpe catheter was advanced into the right ureter and there was an obstruction at the right ureterovesical junction. Roadrunner wire was successfully advanced into the urinary bladder and the catheter was advanced into the urinary bladder. Super stiff Amplatz wire was placed. 9 French vascular sheath was placed over the superstiff Amplatz wire and a Bentson wire was placed as a safety wire. 8 [place], 22 cm ureter stent was advanced over the superstiff Amplatz wire and deployed within the urinary bladder and in the right kidney upper pole calyx. 8.5 French nephrostomy tube was placed over the Bentson wire and deployed in the renal pelvis. Nephrostomy tube was flushed and capped. Nephrostomy tube was sutured to skin. FINDINGS: Obstruction at the bilateral ureterovesical junctions. Catheters and wires were successfully advanced beyond the ureterovesical junction obstruction bilaterally. 8 French, 22 cm ureter stents were placed bilaterally. Left ureter stent is positioned in the renal pelvis along with an 8.5 French nephrostomy tube. The right ureter stent is coiled in the right upper pole calyx. Right nephrostomy tube is coiled in the renal pelvis. IMPRESSION: 1. Successful placement of bilateral antegrade ureter stents. 2. Replacement of bilateral nephrostomy tubes. Both nephrostomy tubes are capped. Nephrostomy tubes can be removed at a later time with fluoroscopy. Electronically Signed   By: Markus Daft M.D.   On: 08/31/2021 18:02     Labs: Basic Metabolic Panel: Recent Labs  Lab 08/31/21 0304 08/31/21 1724 09/01/21 0531 09/01/21 1706 09/02/21 0207 09/05/21 0235  NA 149*  --  144  --  143 141  K 3.7  --  4.0  --  4.4 4.9  CL 113*  --  112*  --  111 112*  CO2 25  --  25  --  21* 19*  GLUCOSE 100*  --  121*  --  96 115*  BUN 33*  --  37*  --  44* 56*  CREATININE 3.05*  --  3.00*  --  2.55* 2.02*  CALCIUM 8.5*  --  8.4*  --  8.5* 8.2*  MG 1.8 1.8 2.0 1.9  --   --    PHOS 3.0 3.0 2.4* 2.8  --   --    CBC: Recent Labs  Lab 08/31/21 0304 08/31/21 1724 09/01/21 0531 09/01/21 1501 09/02/21 0207 09/03/21 0339 09/04/21 0222  WBC 9.6 10.4 10.5  --  10.2 8.7 6.7  NEUTROABS 7.0  --  7.9*  --  7.4 5.8 4.3  HGB 7.4* 7.5* 6.8* 8.3* 8.2* 8.1* 8.0*  HCT 24.3* 24.6* 22.2* 25.5* 26.5* 25.5* 25.5*  MCV 99.6 100.4* 100.9*  --  95.3 95.1 95.9  PLT 86* 85* 89*  --  84* 88* 91*    CBG: Recent Labs  Lab 09/05/21 1546 09/05/21 2007 09/06/21 0006 09/06/21 0404 09/06/21 0726  GLUCAP 117* 127* 130* 114* 103*       Signed:  Erin Hearing ANP [facility] 09/06/2021, 11:10 AM

## 2021-09-06 NOTE — Progress Notes (Addendum)
1:10pm: CSW spoke with Anderson Malta who states patient can be accepted today - he will go to room 5E23.  8:30am: CSW spoke with Anderson Malta at [facility] to discuss patient's discharge plan - Anderson Malta to follow closely for admission once appropriate.  Madilyn Fireman, MSW, LCSW Transitions of Care  Clinical Social Worker II [phone]

## 2021-09-06 NOTE — Progress Notes (Addendum)
Calorie Count Note  48 hour calorie count ordered.  Diet: NPO Supplements: N/A  Breakfast: 0 kcal, 0 grams protein Lunch: 0 kcal, 0 grams protein Dinner: 0 kcal, 0 grams protein Supplements: 0 kcal, 0 grams  Total intake: 0 kcal (0% of minimum estimated needs)  0 protein (0% of minimum estimated needs)  Nutrition Dx: Moderate Malnutrition related to chronic illness (dementia) as evidenced by mild fat depletion, moderate fat depletion, mild muscle depletion, moderate muscle depletion. - ongoing  Goal: Patient will meet greater than or equal to 90% of their needs. - met with TF  Intervention:  Discontinue calorie count. Continue Osmolite 1.2 @ 70 ml/h (1680 ml per day) Continue Prosource TF 45 ml daily Continue free water flushes 125 ml q 4 hrs. Provides 2056 kcal, 104 gm protein, 2127 ml free water daily. Recommend permanent feeding access if continued aggressive care is desired.  Derrel Nip, RD, LDN (she/her/hers) Registered Dietitian I After-Hours/Weekend Pager # in [place]

## 2021-09-07 ENCOUNTER — Other Ambulatory Visit (HOSPITAL_COMMUNITY): Payer: Medicare Other

## 2021-09-07 LAB — CBC WITH DIFFERENTIAL/PLATELET
Abs Immature Granulocytes: 0.06 10*3/uL (ref 0.00–0.07)
Basophils Absolute: 0 10*3/uL (ref 0.0–0.1)
Basophils Relative: 0 %
Eosinophils Absolute: 0.2 10*3/uL (ref 0.0–0.5)
Eosinophils Relative: 3 %
HCT: 27.4 % — ABNORMAL LOW (ref 39.0–52.0)
Hemoglobin: 8.3 g/dL — ABNORMAL LOW (ref 13.0–17.0)
Immature Granulocytes: 1 %
Lymphocytes Relative: 27 %
Lymphs Abs: 2.2 10*3/uL (ref 0.7–4.0)
MCH: 29.5 pg (ref 26.0–34.0)
MCHC: 30.3 g/dL (ref 30.0–36.0)
MCV: 97.5 fL (ref 80.0–100.0)
Monocytes Absolute: 0.8 10*3/uL (ref 0.1–1.0)
Monocytes Relative: 10 %
Neutro Abs: 4.8 10*3/uL (ref 1.7–7.7)
Neutrophils Relative %: 59 %
Platelets: 158 10*3/uL (ref 150–400)
RBC: 2.81 MIL/uL — ABNORMAL LOW (ref 4.22–5.81)
RDW: 18 % — ABNORMAL HIGH (ref 11.5–15.5)
WBC: 8.1 10*3/uL (ref 4.0–10.5)
nRBC: 0 % (ref 0.0–0.2)

## 2021-09-07 LAB — COMPREHENSIVE METABOLIC PANEL
ALT: 79 U/L — ABNORMAL HIGH (ref 0–44)
AST: 53 U/L — ABNORMAL HIGH (ref 15–41)
Albumin: 2.3 g/dL — ABNORMAL LOW (ref 3.5–5.0)
Alkaline Phosphatase: 747 U/L — ABNORMAL HIGH (ref 38–126)
Anion gap: 10 (ref 5–15)
BUN: 61 mg/dL — ABNORMAL HIGH (ref 8–23)
CO2: 21 mmol/L — ABNORMAL LOW (ref 22–32)
Calcium: 8.8 mg/dL — ABNORMAL LOW (ref 8.9–10.3)
Chloride: 112 mmol/L — ABNORMAL HIGH (ref 98–111)
Creatinine, Ser: 2.11 mg/dL — ABNORMAL HIGH (ref 0.61–1.24)
GFR, Estimated: 30 mL/min — ABNORMAL LOW (ref >60)
Glucose, Bld: 96 mg/dL (ref 70–99)
Potassium: 5.1 mmol/L (ref 3.5–5.1)
Sodium: 143 mmol/L (ref 135–145)
Total Bilirubin: 0.6 mg/dL (ref 0.3–1.2)
Total Protein: 7.6 g/dL (ref 6.5–8.1)

## 2021-09-07 LAB — HEMOGLOBIN A1C
Hgb A1c MFr Bld: 6.1 % — ABNORMAL HIGH (ref 4.8–5.6)
Mean Plasma Glucose: 128.37 mg/dL

## 2021-09-09 LAB — BASIC METABOLIC PANEL
Anion gap: 8 (ref 5–15)
BUN: 65 mg/dL — ABNORMAL HIGH (ref 8–23)
CO2: 19 mmol/L — ABNORMAL LOW (ref 22–32)
Calcium: 8.7 mg/dL — ABNORMAL LOW (ref 8.9–10.3)
Chloride: 116 mmol/L — ABNORMAL HIGH (ref 98–111)
Creatinine, Ser: 2.23 mg/dL — ABNORMAL HIGH (ref 0.61–1.24)
GFR, Estimated: 28 mL/min — ABNORMAL LOW (ref >60)
Glucose, Bld: 95 mg/dL (ref 70–99)
Potassium: 4.7 mmol/L (ref 3.5–5.1)
Sodium: 143 mmol/L (ref 135–145)

## 2021-10-04 ENCOUNTER — Other Ambulatory Visit: Payer: Self-pay | Admitting: Urology

## 2021-10-19 ENCOUNTER — Encounter (HOSPITAL_COMMUNITY): Payer: Self-pay | Admitting: Urology

## 2021-10-19 NOTE — Progress Notes (Signed)
Preop instructions for:   Anthony Dudley                        Date of Birth   1933-06-08                          Date of Procedure:   11/11/2021       Doctor: DR Raynelle Bring  [facility] to arrive at [facility]:  0900 am  Report to: Admitting  Procedure: Cystoscopy with stent change suprapubic tube placement    Do not eat or drink past midnight the night before your procedure.(To include any tube feedings-must be discontinued)    Take these morning medications only with sips of water.(or give through gastrostomy or feeding tube). Famotidine   Note: No Insulin or Diabetic meds should be given or taken the morning of the procedure!   Facility contact:     [facility] in [facility], LPN              Phone:    [place]: Anthony Dudley- brother- [phone]  Transportation contact phone#: [facility] in [place]- [phone].  Please send day of procedure:current med list and meds last taken that day, confirm nothing by mouth status from what time, Patient Demographic info( to include DNR status, problem list, allergies)   RN contact  [place] LPN  [phone]                       and Fax #: [phone] Bring Insurance card and picture ID Leave all jewelry and other valuables at place where living( no metal or rings to be worn) No contact lens Women-no make-up, no lotions,perfumes,powders Men-no colognes,lotions  Any questions day of procedure,call  [facility]-[phone]    Sent from :[facility]                   [place]                   Fax:[phone]  Sent by :    Gillian Shields RN

## 2021-10-19 NOTE — Progress Notes (Addendum)
Called and spoke with sister in law and she informed nurse where pt was at [facility] in [place].  Brother, [place], [place] left message with his wife he is to call nurse.  [facility] and faxed to them to request necessary info for surgery.  Received and placed on chart the following:  Demographic sheet  MAR Labs done 09/30/21 of CMP lipid panel, CBC wth diff,  Immunization report  Discharge Summary from [facility] at [place]- dated 09/13/21  Brother, Rachid Parham returned call on 10/19/21 and gave phone consent for surgery on 11/11/21 with 2 RNs.  Consent on front of chart [place] and POA on chart.  Also under Media Tab in epic.   EKg-09/01/21  Echo- 08/21/21 CXR- 09/07/21  PCP- DR Rhona Leavens  Discharge Summary - 09/06/21 from [facility] then to [facility] from 09/06/21-09/10/21 now at [facility] in [place].

## 2021-10-26 ENCOUNTER — Encounter (HOSPITAL_COMMUNITY): Payer: Self-pay | Admitting: Urology

## 2021-10-28 NOTE — Progress Notes (Signed)
Anesthesia Chart Review   Case: [id number] Date/Time: 11/11/21 1145   Procedure: CYSTOSCOPY WITH STENT  CHANGE/ SUPRO PUBIC TUBE PLACEMENT (Bilateral)   Anesthesia type: General   Pre-op diagnosis: BILATERAL URETERAL OBSTRUCTION/ URINARY RETENTION   Location: [facility] / [facility]   Surgeons: Raynelle Bring, MD       DISCUSSION:87 y.o. never smoker with h/o GERD, dementia, CHF, CKD Stage III, prostate cancer, bilateral ureteral obstruction scheduled for above procedure 11/11/2021 with Dr. Raynelle Bring.   Admission in October with sepsis due to UTI, s/p cystoscopy 08/18/2021 with no anesthesia complications noted.   Pt resides in nursing facility, same day workup.  VS: There were no vitals taken for this visit.  PROVIDERSMaryella Shivers, MD is PCP    LABS:  labs on chart (all labs ordered are listed, but only abnormal results are displayed)  Labs Reviewed - No data to display   IMAGES:   EKG: 08/23/2021 Rate 95 bpm  Sinus rhythm with Premature supraventricular complexes Otherwise normal ECG Interpretation limited secondary to artifact  CV: Echo 08/21/2021 1. Left ventricular ejection fraction, by estimation, is 55 to 60%. The  left ventricle has normal function. The left ventricle has no regional  wall motion abnormalities. There is mild left ventricular hypertrophy.  Left ventricular diastolic parameters  are indeterminate.   2. Right ventricular systolic function was not well visualized. The right  ventricular size is not well visualized. There is normal pulmonary artery  systolic pressure.   3. The mitral valve is normal in structure. Trivial mitral valve  regurgitation. No evidence of mitral stenosis.   4. The aortic valve was not well visualized. Aortic valve regurgitation  is not visualized. Mild to moderate aortic valve sclerosis/calcification  is present, without any evidence of aortic stenosis.  Past Medical History:  Diagnosis Date   Adult  failure to thrive    Anemia    Anxiety    Cancer (HCC)    metastatic prostate cancer   CHF (congestive heart failure) (HCC)    Chronic kidney disease    stage 3   Dementia (HCC)    Depression    Dysphagia    Foley catheter in place    GERD (gastroesophageal reflux disease)    Hematuria    History of blood transfusion 08/20/2021   Hypernatremia    Hyponatremia    hx of   Malnutrition (HCC)    Obstructive and reflux uropathy    Physical debility    Severe sepsis with septic shock (CODE) (HCC)    hx of   SVT (supraventricular tachycardia) (HCC)    hx of   Urethral obstruction    bilateral   Urinary tract infection    hx of    Past Surgical History:  Procedure Laterality Date   CYSTOSCOPY WITH RETROGRADE PYELOGRAM, URETEROSCOPY AND STENT PLACEMENT Bilateral 08/18/2021   Procedure: 1.  Cystoscopy 2.  Fulguration of bladder;  Surgeon: Raynelle Bring, MD;  Location: [facility];  Service: Urology;  Laterality: Bilateral;   IR NEPHRO TUBE REMOV/FL  09/06/2021   IR NEPHRO TUBE REMOV/FL  09/06/2021   IR NEPHROSTOMY PLACEMENT LEFT  08/19/2021   IR NEPHROSTOMY PLACEMENT RIGHT  08/19/2021   IR URETERAL STENT PLACEMENT EXISTING ACCESS LEFT  08/31/2021   IR URETERAL STENT PLACEMENT EXISTING ACCESS RIGHT  08/31/2021   TONSILECTOMY, ADENOIDECTOMY, BILATERAL MYRINGOTOMY AND TUBES      MEDICATIONS: No current facility-administered medications for this encounter.    acetaminophen (TYLENOL) 325 MG tablet  Cholecalciferol (VITAMIN D-3) 125 MCG (9179 UT) TABS   folic acid (FOLVITE) 1 MG tablet   food thickener (SIMPLYTHICK, HONEY/LEVEL 3/MODERATELY THICK,) LIQD   ipratropium-albuterol (DUONEB) 0.5-2.5 (3) MG/3ML SOLN   LORazepam (ATIVAN) 2 MG/ML injection   magnesium oxide (MAG-OX) 400 (240 Mg) MG tablet   metoprolol tartrate (LOPRESSOR) 5 MG/5ML SOLN injection   Multiple Vitamin (MULTIVITAMIN WITH MINERALS) TABS tablet   Nutritional Supplements (FEEDING SUPPLEMENT, OSMOLITE 1.2 CAL,)  LIQD   Nutritional Supplements (FEEDING SUPPLEMENT, PROSOURCE TF,) liquid   oxybutynin (DITROPAN) 5 MG tablet   vitamin B-12 (CYANOCOBALAMIN) 1000 MCG tablet   Water For Irrigation, Sterile (FREE WATER) SOLN     Jullianna Gabor Ward, PA-C [facility] [phone]

## 2021-11-03 ENCOUNTER — Emergency Department (HOSPITAL_COMMUNITY): Payer: Medicare Other

## 2021-11-03 ENCOUNTER — Inpatient Hospital Stay (HOSPITAL_COMMUNITY)
Admission: EM | Admit: 2021-11-03 | Discharge: 2021-12-06 | DRG: 659 | Disposition: A | Discharge status: Skilled Nursing Facility | Payer: Medicare Other | Source: Ambulatory Visit | Attending: Internal Medicine | Admitting: Internal Medicine

## 2021-11-03 ENCOUNTER — Encounter (HOSPITAL_COMMUNITY): Payer: Self-pay | Admitting: Emergency Medicine

## 2021-11-03 DIAGNOSIS — T4275XA Adverse effect of unspecified antiepileptic and sedative-hypnotic drugs, initial encounter: Secondary | ICD-10-CM | POA: Diagnosis not present

## 2021-11-03 DIAGNOSIS — Z7189 Other specified counseling: Secondary | ICD-10-CM

## 2021-11-03 DIAGNOSIS — R339 Retention of urine, unspecified: Secondary | ICD-10-CM | POA: Diagnosis present

## 2021-11-03 DIAGNOSIS — R1310 Dysphagia, unspecified: Secondary | ICD-10-CM | POA: Diagnosis not present

## 2021-11-03 DIAGNOSIS — N179 Acute kidney failure, unspecified: Secondary | ICD-10-CM | POA: Diagnosis not present

## 2021-11-03 DIAGNOSIS — R338 Other retention of urine: Secondary | ICD-10-CM | POA: Diagnosis present

## 2021-11-03 DIAGNOSIS — I5032 Chronic diastolic (congestive) heart failure: Secondary | ICD-10-CM | POA: Diagnosis present

## 2021-11-03 DIAGNOSIS — N410 Acute prostatitis: Secondary | ICD-10-CM | POA: Diagnosis present

## 2021-11-03 DIAGNOSIS — D6489 Other specified anemias: Secondary | ICD-10-CM | POA: Diagnosis present

## 2021-11-03 DIAGNOSIS — Y846 Urinary catheterization as the cause of abnormal reaction of the patient, or of later complication, without mention of misadventure at the time of the procedure: Secondary | ICD-10-CM | POA: Diagnosis present

## 2021-11-03 DIAGNOSIS — I468 Cardiac arrest due to other underlying condition: Secondary | ICD-10-CM | POA: Diagnosis not present

## 2021-11-03 DIAGNOSIS — N368 Other specified disorders of urethra: Secondary | ICD-10-CM | POA: Diagnosis present

## 2021-11-03 DIAGNOSIS — R627 Adult failure to thrive: Secondary | ICD-10-CM | POA: Diagnosis not present

## 2021-11-03 DIAGNOSIS — N2589 Other disorders resulting from impaired renal tubular function: Secondary | ICD-10-CM | POA: Diagnosis not present

## 2021-11-03 DIAGNOSIS — H548 Legal blindness, as defined in USA: Secondary | ICD-10-CM | POA: Diagnosis present

## 2021-11-03 DIAGNOSIS — R7401 Elevation of levels of liver transaminase levels: Secondary | ICD-10-CM | POA: Diagnosis not present

## 2021-11-03 DIAGNOSIS — J690 Pneumonitis due to inhalation of food and vomit: Secondary | ICD-10-CM | POA: Diagnosis present

## 2021-11-03 DIAGNOSIS — Z8546 Personal history of malignant neoplasm of prostate: Secondary | ICD-10-CM

## 2021-11-03 DIAGNOSIS — Z20822 Contact with and (suspected) exposure to covid-19: Secondary | ICD-10-CM | POA: Diagnosis present

## 2021-11-03 DIAGNOSIS — I9581 Postprocedural hypotension: Secondary | ICD-10-CM | POA: Diagnosis not present

## 2021-11-03 DIAGNOSIS — I130 Hypertensive heart and chronic kidney disease with heart failure and stage 1 through stage 4 chronic kidney disease, or unspecified chronic kidney disease: Secondary | ICD-10-CM | POA: Diagnosis present

## 2021-11-03 DIAGNOSIS — R609 Edema, unspecified: Secondary | ICD-10-CM | POA: Diagnosis not present

## 2021-11-03 DIAGNOSIS — Z87440 Personal history of urinary (tract) infections: Secondary | ICD-10-CM

## 2021-11-03 DIAGNOSIS — I4891 Unspecified atrial fibrillation: Secondary | ICD-10-CM | POA: Diagnosis not present

## 2021-11-03 DIAGNOSIS — L89626 Pressure-induced deep tissue damage of left heel: Secondary | ICD-10-CM | POA: Diagnosis not present

## 2021-11-03 DIAGNOSIS — D638 Anemia in other chronic diseases classified elsewhere: Secondary | ICD-10-CM

## 2021-11-03 DIAGNOSIS — R319 Hematuria, unspecified: Secondary | ICD-10-CM | POA: Diagnosis present

## 2021-11-03 DIAGNOSIS — Z01818 Encounter for other preprocedural examination: Secondary | ICD-10-CM

## 2021-11-03 DIAGNOSIS — D539 Nutritional anemia, unspecified: Secondary | ICD-10-CM | POA: Diagnosis not present

## 2021-11-03 DIAGNOSIS — R471 Dysarthria and anarthria: Secondary | ICD-10-CM | POA: Diagnosis not present

## 2021-11-03 DIAGNOSIS — E876 Hypokalemia: Secondary | ICD-10-CM | POA: Diagnosis present

## 2021-11-03 DIAGNOSIS — J9601 Acute respiratory failure with hypoxia: Secondary | ICD-10-CM | POA: Diagnosis not present

## 2021-11-03 DIAGNOSIS — I5030 Unspecified diastolic (congestive) heart failure: Secondary | ICD-10-CM | POA: Diagnosis not present

## 2021-11-03 DIAGNOSIS — E43 Unspecified severe protein-calorie malnutrition: Secondary | ICD-10-CM | POA: Diagnosis present

## 2021-11-03 DIAGNOSIS — N136 Pyonephrosis: Secondary | ICD-10-CM | POA: Diagnosis present

## 2021-11-03 DIAGNOSIS — N390 Urinary tract infection, site not specified: Secondary | ICD-10-CM

## 2021-11-03 DIAGNOSIS — R739 Hyperglycemia, unspecified: Secondary | ICD-10-CM | POA: Diagnosis present

## 2021-11-03 DIAGNOSIS — E8809 Other disorders of plasma-protein metabolism, not elsewhere classified: Secondary | ICD-10-CM | POA: Diagnosis present

## 2021-11-03 DIAGNOSIS — R6521 Severe sepsis with septic shock: Secondary | ICD-10-CM | POA: Diagnosis present

## 2021-11-03 DIAGNOSIS — R64 Cachexia: Secondary | ICD-10-CM | POA: Diagnosis present

## 2021-11-03 DIAGNOSIS — F0394 Unspecified dementia, unspecified severity, with anxiety: Secondary | ICD-10-CM | POA: Diagnosis present

## 2021-11-03 DIAGNOSIS — E860 Dehydration: Secondary | ICD-10-CM | POA: Diagnosis present

## 2021-11-03 DIAGNOSIS — Z515 Encounter for palliative care: Secondary | ICD-10-CM | POA: Diagnosis not present

## 2021-11-03 DIAGNOSIS — J189 Pneumonia, unspecified organism: Secondary | ICD-10-CM

## 2021-11-03 DIAGNOSIS — Z22322 Carrier or suspected carrier of Methicillin resistant Staphylococcus aureus: Secondary | ICD-10-CM

## 2021-11-03 DIAGNOSIS — D649 Anemia, unspecified: Secondary | ICD-10-CM | POA: Diagnosis not present

## 2021-11-03 DIAGNOSIS — I959 Hypotension, unspecified: Secondary | ICD-10-CM | POA: Diagnosis not present

## 2021-11-03 DIAGNOSIS — R140 Abdominal distension (gaseous): Secondary | ICD-10-CM

## 2021-11-03 DIAGNOSIS — T380X5A Adverse effect of glucocorticoids and synthetic analogues, initial encounter: Secondary | ICD-10-CM | POA: Diagnosis not present

## 2021-11-03 DIAGNOSIS — E870 Hyperosmolality and hypernatremia: Secondary | ICD-10-CM | POA: Diagnosis not present

## 2021-11-03 DIAGNOSIS — Z7401 Bed confinement status: Secondary | ICD-10-CM

## 2021-11-03 DIAGNOSIS — R946 Abnormal results of thyroid function studies: Secondary | ICD-10-CM | POA: Diagnosis present

## 2021-11-03 DIAGNOSIS — E878 Other disorders of electrolyte and fluid balance, not elsewhere classified: Secondary | ICD-10-CM | POA: Diagnosis present

## 2021-11-03 DIAGNOSIS — F0393 Unspecified dementia, unspecified severity, with mood disturbance: Secondary | ICD-10-CM | POA: Diagnosis present

## 2021-11-03 DIAGNOSIS — T83511A Infection and inflammatory reaction due to indwelling urethral catheter, initial encounter: Secondary | ICD-10-CM | POA: Diagnosis present

## 2021-11-03 DIAGNOSIS — C7951 Secondary malignant neoplasm of bone: Secondary | ICD-10-CM | POA: Diagnosis present

## 2021-11-03 DIAGNOSIS — N3000 Acute cystitis without hematuria: Secondary | ICD-10-CM

## 2021-11-03 DIAGNOSIS — D7589 Other specified diseases of blood and blood-forming organs: Secondary | ICD-10-CM | POA: Diagnosis present

## 2021-11-03 DIAGNOSIS — E8729 Other acidosis: Secondary | ICD-10-CM | POA: Diagnosis present

## 2021-11-03 DIAGNOSIS — R001 Bradycardia, unspecified: Secondary | ICD-10-CM | POA: Diagnosis not present

## 2021-11-03 DIAGNOSIS — D631 Anemia in chronic kidney disease: Secondary | ICD-10-CM | POA: Diagnosis present

## 2021-11-03 DIAGNOSIS — R197 Diarrhea, unspecified: Secondary | ICD-10-CM | POA: Diagnosis not present

## 2021-11-03 DIAGNOSIS — N1832 Chronic kidney disease, stage 3b: Secondary | ICD-10-CM | POA: Diagnosis present

## 2021-11-03 DIAGNOSIS — L89616 Pressure-induced deep tissue damage of right heel: Secondary | ICD-10-CM | POA: Diagnosis not present

## 2021-11-03 DIAGNOSIS — K219 Gastro-esophageal reflux disease without esophagitis: Secondary | ICD-10-CM | POA: Diagnosis present

## 2021-11-03 DIAGNOSIS — N170 Acute kidney failure with tubular necrosis: Secondary | ICD-10-CM | POA: Diagnosis present

## 2021-11-03 DIAGNOSIS — C7989 Secondary malignant neoplasm of other specified sites: Secondary | ICD-10-CM | POA: Diagnosis not present

## 2021-11-03 DIAGNOSIS — R630 Anorexia: Secondary | ICD-10-CM

## 2021-11-03 DIAGNOSIS — Z431 Encounter for attention to gastrostomy: Secondary | ICD-10-CM

## 2021-11-03 DIAGNOSIS — R0989 Other specified symptoms and signs involving the circulatory and respiratory systems: Secondary | ICD-10-CM

## 2021-11-03 DIAGNOSIS — I10 Essential (primary) hypertension: Secondary | ICD-10-CM | POA: Diagnosis present

## 2021-11-03 DIAGNOSIS — R4182 Altered mental status, unspecified: Secondary | ICD-10-CM

## 2021-11-03 DIAGNOSIS — R000 Tachycardia, unspecified: Secondary | ICD-10-CM | POA: Diagnosis present

## 2021-11-03 DIAGNOSIS — G9341 Metabolic encephalopathy: Secondary | ICD-10-CM | POA: Diagnosis present

## 2021-11-03 DIAGNOSIS — A419 Sepsis, unspecified organism: Secondary | ICD-10-CM

## 2021-11-03 DIAGNOSIS — N189 Chronic kidney disease, unspecified: Secondary | ICD-10-CM

## 2021-11-03 DIAGNOSIS — R9389 Abnormal findings on diagnostic imaging of other specified body structures: Secondary | ICD-10-CM

## 2021-11-03 DIAGNOSIS — L89891 Pressure ulcer of other site, stage 1: Secondary | ICD-10-CM | POA: Diagnosis not present

## 2021-11-03 DIAGNOSIS — Z789 Other specified health status: Secondary | ICD-10-CM

## 2021-11-03 DIAGNOSIS — I495 Sick sinus syndrome: Secondary | ICD-10-CM | POA: Diagnosis not present

## 2021-11-03 DIAGNOSIS — Z960 Presence of urogenital implants: Secondary | ICD-10-CM | POA: Diagnosis present

## 2021-11-03 DIAGNOSIS — C61 Malignant neoplasm of prostate: Secondary | ICD-10-CM | POA: Diagnosis present

## 2021-11-03 DIAGNOSIS — R600 Localized edema: Secondary | ICD-10-CM | POA: Diagnosis present

## 2021-11-03 DIAGNOSIS — L89153 Pressure ulcer of sacral region, stage 3: Secondary | ICD-10-CM | POA: Diagnosis present

## 2021-11-03 DIAGNOSIS — E871 Hypo-osmolality and hyponatremia: Secondary | ICD-10-CM | POA: Diagnosis not present

## 2021-11-03 DIAGNOSIS — R579 Shock, unspecified: Secondary | ICD-10-CM | POA: Diagnosis not present

## 2021-11-03 DIAGNOSIS — L8990 Pressure ulcer of unspecified site, unspecified stage: Secondary | ICD-10-CM | POA: Diagnosis present

## 2021-11-03 DIAGNOSIS — Z6824 Body mass index (BMI) 24.0-24.9, adult: Secondary | ICD-10-CM

## 2021-11-03 DIAGNOSIS — T85528A Displacement of other gastrointestinal prosthetic devices, implants and grafts, initial encounter: Secondary | ICD-10-CM

## 2021-11-03 DIAGNOSIS — L89629 Pressure ulcer of left heel, unspecified stage: Secondary | ICD-10-CM | POA: Diagnosis not present

## 2021-11-03 LAB — BASIC METABOLIC PANEL
Anion gap: 11 (ref 5–15)
BUN: 111 mg/dL — ABNORMAL HIGH (ref 8–23)
CO2: 18 mmol/L — ABNORMAL LOW (ref 22–32)
Calcium: 8.2 mg/dL — ABNORMAL LOW (ref 8.9–10.3)
Chloride: 114 mmol/L — ABNORMAL HIGH (ref 98–111)
Creatinine, Ser: 5.4 mg/dL — ABNORMAL HIGH (ref 0.61–1.24)
GFR, Estimated: 10 mL/min — ABNORMAL LOW (ref >60)
Glucose, Bld: 135 mg/dL — ABNORMAL HIGH (ref 70–99)
Potassium: 4.3 mmol/L (ref 3.5–5.1)
Sodium: 143 mmol/L (ref 135–145)

## 2021-11-03 LAB — CBC
HCT: 23 % — ABNORMAL LOW (ref 39.0–52.0)
Hemoglobin: 7.2 g/dL — ABNORMAL LOW (ref 13.0–17.0)
MCH: 32.1 pg (ref 26.0–34.0)
MCHC: 31.3 g/dL (ref 30.0–36.0)
MCV: 102.7 fL — ABNORMAL HIGH (ref 80.0–100.0)
Platelets: 242 10*3/uL (ref 150–400)
RBC: 2.24 MIL/uL — ABNORMAL LOW (ref 4.22–5.81)
RDW: 20.1 % — ABNORMAL HIGH (ref 11.5–15.5)
WBC: 11.7 10*3/uL — ABNORMAL HIGH (ref 4.0–10.5)
nRBC: 0 % (ref 0.0–0.2)

## 2021-11-03 LAB — RESP PANEL BY RT-PCR (FLU A&B, COVID) ARPGX2
Influenza A by PCR: NEGATIVE
Influenza B by PCR: NEGATIVE
SARS Coronavirus 2 by RT PCR: NEGATIVE

## 2021-11-03 LAB — CBC WITH DIFFERENTIAL/PLATELET
Abs Immature Granulocytes: 0.12 10*3/uL — ABNORMAL HIGH (ref 0.00–0.07)
Basophils Absolute: 0 10*3/uL (ref 0.0–0.1)
Basophils Relative: 0 %
Eosinophils Absolute: 0 10*3/uL (ref 0.0–0.5)
Eosinophils Relative: 0 %
HCT: 26.9 % — ABNORMAL LOW (ref 39.0–52.0)
Hemoglobin: 8.3 g/dL — ABNORMAL LOW (ref 13.0–17.0)
Immature Granulocytes: 1 %
Lymphocytes Relative: 9 %
Lymphs Abs: 1 10*3/uL (ref 0.7–4.0)
MCH: 32.5 pg (ref 26.0–34.0)
MCHC: 30.9 g/dL (ref 30.0–36.0)
MCV: 105.5 fL — ABNORMAL HIGH (ref 80.0–100.0)
Monocytes Absolute: 0.7 10*3/uL (ref 0.1–1.0)
Monocytes Relative: 6 %
Neutro Abs: 10.1 10*3/uL — ABNORMAL HIGH (ref 1.7–7.7)
Neutrophils Relative %: 84 %
Platelets: 290 10*3/uL (ref 150–400)
RBC: 2.55 MIL/uL — ABNORMAL LOW (ref 4.22–5.81)
RDW: 20.3 % — ABNORMAL HIGH (ref 11.5–15.5)
WBC: 12 10*3/uL — ABNORMAL HIGH (ref 4.0–10.5)
nRBC: 0 % (ref 0.0–0.2)

## 2021-11-03 LAB — URINALYSIS, ROUTINE W REFLEX MICROSCOPIC
Bilirubin Urine: NEGATIVE
Glucose, UA: NEGATIVE mg/dL
Ketones, ur: NEGATIVE mg/dL
Nitrite: NEGATIVE
Protein, ur: >=300 mg/dL — AB
Specific Gravity, Urine: 1.012 (ref 1.005–1.030)
pH: 8 (ref 5.0–8.0)

## 2021-11-03 LAB — CREATININE, SERUM
Creatinine, Ser: 5.25 mg/dL — ABNORMAL HIGH (ref 0.61–1.24)
GFR, Estimated: 10 mL/min — ABNORMAL LOW (ref >60)

## 2021-11-03 LAB — COMPREHENSIVE METABOLIC PANEL
ALT: 12 U/L (ref 0–44)
AST: 13 U/L — ABNORMAL LOW (ref 15–41)
Albumin: 2.8 g/dL — ABNORMAL LOW (ref 3.5–5.0)
Alkaline Phosphatase: 418 U/L — ABNORMAL HIGH (ref 38–126)
Anion gap: 9 (ref 5–15)
BUN: 115 mg/dL — ABNORMAL HIGH (ref 8–23)
CO2: 22 mmol/L (ref 22–32)
Calcium: 8.3 mg/dL — ABNORMAL LOW (ref 8.9–10.3)
Chloride: 114 mmol/L — ABNORMAL HIGH (ref 98–111)
Creatinine, Ser: 6.08 mg/dL — ABNORMAL HIGH (ref 0.61–1.24)
GFR, Estimated: 8 mL/min — ABNORMAL LOW (ref >60)
Glucose, Bld: 118 mg/dL — ABNORMAL HIGH (ref 70–99)
Potassium: 4.8 mmol/L (ref 3.5–5.1)
Sodium: 145 mmol/L (ref 135–145)
Total Bilirubin: 0.9 mg/dL (ref 0.3–1.2)
Total Protein: 7.3 g/dL (ref 6.5–8.1)

## 2021-11-03 LAB — LACTIC ACID, PLASMA
Lactic Acid, Venous: 1.5 mmol/L (ref 0.5–1.9)
Lactic Acid, Venous: 1.1 mmol/L (ref 0.5–1.9)

## 2021-11-03 LAB — APTT: aPTT: 46 seconds — ABNORMAL HIGH (ref 24–36)

## 2021-11-03 LAB — GLUCOSE, CAPILLARY: Glucose-Capillary: 128 mg/dL — ABNORMAL HIGH (ref 70–99)

## 2021-11-03 LAB — MRSA NEXT GEN BY PCR, NASAL: MRSA by PCR Next Gen: DETECTED — AB

## 2021-11-03 LAB — PROTIME-INR
INR: 1.7 — ABNORMAL HIGH (ref 0.8–1.2)
Prothrombin Time: 20 seconds — ABNORMAL HIGH (ref 11.4–15.2)

## 2021-11-03 MED ORDER — NOREPINEPHRINE 4 MG/250ML-% IV SOLN
2.0000 ug/min | INTRAVENOUS | Status: DC
  Administered 2021-11-03: 22:00:00 7 ug/min via INTRAVENOUS
  Administered 2021-11-04: 16:00:00 3 ug/min via INTRAVENOUS
  Administered 2021-11-04: 04:00:00 7 ug/min via INTRAVENOUS
  Administered 2021-11-06: 02:00:00 2 ug/min via INTRAVENOUS
  Filled 2021-11-03 (×3): qty 250

## 2021-11-03 MED ORDER — ORAL CARE MOUTH RINSE
15.0000 mL | Freq: Two times a day (BID) | OROMUCOSAL | Status: DC
  Administered 2021-11-03: 22:00:00 15 mL via OROMUCOSAL

## 2021-11-03 MED ORDER — METRONIDAZOLE 500 MG/100ML IV SOLN
500.0000 mg | Freq: Once | INTRAVENOUS | Status: AC
  Administered 2021-11-03: 17:00:00 500 mg via INTRAVENOUS
  Filled 2021-11-03: qty 100

## 2021-11-03 MED ORDER — MUPIROCIN 2 % EX OINT
1.0000 "application " | TOPICAL_OINTMENT | Freq: Two times a day (BID) | CUTANEOUS | Status: AC
  Administered 2021-11-04 – 2021-11-08 (×10): 1 via NASAL
  Filled 2021-11-03 (×2): qty 22

## 2021-11-03 MED ORDER — LACTATED RINGERS IV BOLUS
1000.0000 mL | Freq: Once | INTRAVENOUS | Status: AC
  Administered 2021-11-03: 16:00:00 1000 mL via INTRAVENOUS

## 2021-11-03 MED ORDER — POLYETHYLENE GLYCOL 3350 17 G PO PACK: 17.0000 g | PACK | Freq: Every day | ORAL | Status: DC | PRN

## 2021-11-03 MED ORDER — CHLORHEXIDINE GLUCONATE CLOTH 2 % EX PADS
6.0000 | MEDICATED_PAD | Freq: Every day | CUTANEOUS | Status: DC
  Administered 2021-11-04 – 2021-12-06 (×35): 6 via TOPICAL

## 2021-11-03 MED ORDER — VANCOMYCIN HCL IN DEXTROSE 1-5 GM/200ML-% IV SOLN
1000.0000 mg | Freq: Once | INTRAVENOUS | Status: AC
  Administered 2021-11-03: 18:00:00 1000 mg via INTRAVENOUS
  Filled 2021-11-03: qty 200

## 2021-11-03 MED ORDER — DOCUSATE SODIUM 100 MG PO CAPS: 100.0000 mg | ORAL_CAPSULE | Freq: Two times a day (BID) | ORAL | Status: DC | PRN

## 2021-11-03 MED ORDER — NOREPINEPHRINE 4 MG/250ML-% IV SOLN
0.0000 ug/min | INTRAVENOUS | Status: DC
Start: 2021-11-03 — End: 2021-11-03
  Administered 2021-11-03: 19:00:00 2 ug/min via INTRAVENOUS
  Filled 2021-11-03: qty 250

## 2021-11-03 MED ORDER — SODIUM CHLORIDE 0.9 % IV SOLN
2.0000 g | Freq: Once | INTRAVENOUS | Status: AC
  Filled 2021-11-03: qty 2

## 2021-11-03 MED ORDER — LACTATED RINGERS IV BOLUS (SEPSIS)
1000.0000 mL | Freq: Once | INTRAVENOUS | Status: AC
  Administered 2021-11-03: 17:00:00 1000 mL via INTRAVENOUS

## 2021-11-03 MED ORDER — SODIUM CHLORIDE 0.9 % IV BOLUS
1000.0000 mL | Freq: Once | INTRAVENOUS | Status: AC
  Administered 2021-11-03: 23:00:00 1000 mL via INTRAVENOUS

## 2021-11-03 MED ORDER — HEPARIN SODIUM (PORCINE) 5000 UNIT/ML IJ SOLN
5000.0000 [IU] | Freq: Three times a day (TID) | INTRAMUSCULAR | Status: DC
  Administered 2021-11-03 – 2021-11-13 (×29): 5000 [IU] via SUBCUTANEOUS
  Filled 2021-11-03 (×29): qty 1

## 2021-11-03 MED ORDER — LACTATED RINGERS IV SOLN
INTRAVENOUS | Status: DC
Start: 2021-11-03 — End: 2021-11-03

## 2021-11-03 MED ORDER — INSULIN ASPART 100 UNIT/ML IJ SOLN
0.0000 [IU] | INTRAMUSCULAR | Status: DC
  Administered 2021-11-03 – 2021-11-05 (×2): 1 [IU] via SUBCUTANEOUS
  Filled 2021-11-03: qty 0.09

## 2021-11-03 MED ORDER — SODIUM CHLORIDE 0.9 % IV SOLN
250.0000 mL | INTRAVENOUS | Status: DC
  Administered 2021-11-03 – 2021-11-27 (×3): 250 mL via INTRAVENOUS

## 2021-11-03 MED ORDER — ACETAMINOPHEN 650 MG RE SUPP
650.0000 mg | Freq: Once | RECTAL | Status: AC
  Administered 2021-11-03: 16:00:00 650 mg via RECTAL
  Filled 2021-11-03: qty 1

## 2021-11-03 MED ORDER — SODIUM CHLORIDE 0.9 % IV SOLN
1.0000 g | INTRAVENOUS | Status: DC
  Administered 2021-11-04: 17:00:00 1 g via INTRAVENOUS
  Filled 2021-11-03 (×2): qty 1

## 2021-11-03 MED ORDER — LACTATED RINGERS IV SOLN: INTRAVENOUS | Status: AC

## 2021-11-03 MED ORDER — LACTATED RINGERS IV BOLUS
500.0000 mL | Freq: Once | INTRAVENOUS | Status: AC
  Administered 2021-11-03: 20:00:00 500 mL via INTRAVENOUS

## 2021-11-03 NOTE — ED Notes (Signed)
Patient transported to CT 

## 2021-11-03 NOTE — H&P (Signed)
NAME:  [facility], MRN:  [id number], DOB:  December 10, 1932, LOS: 0 ADMISSION DATE:  11/03/2021, CONSULTATION DATE:  11/03/2021 REFERRING MD:  Anthony Leigh, MD CHIEF COMPLAINT:  fever   History of Present Illness:  [facility] is an 87 year old gentleman with PMH significant for CKD, hypertension, CHF, dementia and prostate cancer. He was hospitalized at [facility] in 10/22 because of AKI superimposed on CKD secondary to bilateral hydronephrosis and urethral erosion resulting in Bilateral CYSTOSCOPY WITH RETROGRADE PYELOGRAM,  AND BILATERAL STENTS with Anthony Bring, MD. The patient had a scheduled pre op visit with urology office today and was found to be pyrexial and tachycardiac. He was diagnosed with urinary retention so a foley cath was placed which evacuated > 800 ml dark murky urine.  Hence he was treated with IM Rocephin and sent to [facility] for further evaluation.  At [facility], the patient was found to be septic with pyuria. He was treated with sepsis protocol IVF bolus but needed Levophed for refractory hypotension. Urine and blood cultures were sent off and he was treated with Flagyl and Vancomycin.  He was referred for ICU admission because of Levophed requirement.  I have seen and examined the patient. He was unable to contribute to this consultation because of impaired mental status. The patient's brother was concerned about possible COVID 19 infection since it has been prevalent at the [facility].  However patient's PCR SARS was negative.  Pertinent  Medical History  Metastatic prostate cancer with hx of hydronephrosis that required ureteral stents.  Significant Hospital Events: Including procedures, antibiotic start and stop dates in addition to other pertinent events   Cefepime 11/04/21. Received Flagyl and Vancomycin 11/03/21  Interim History / Subjective:  N/A  Objective   Blood pressure (!) 103/43, pulse (!) 50, temperature 99.1 F (37.3 C), temperature source Rectal, resp. rate  19, height 5\' 9"  (1.753 m), weight 76.7 kg, SpO2 100 %.        Intake/Output Summary (Last 24 hours) at 11/03/2021 2047 Last data filed at 11/03/2021 2041 Gross per 24 hour  Intake 1999 ml  Output 475 ml  Net 1524 ml   Filed Weights   11/03/21 1745  Weight: 76.7 kg    Examination: General: no acute distress HENT: normocephalic, atraumatic, very dry oral cavity Lungs: good air entry bilaterally, no wheezing, no rhonchi heard Cardiovascular: S1, S2 heard; sinus bradycardia Abdomen: Soft, inaudible bowel sounds, not distended, non tender Extremities: atrophy, left leg swelling as compared to right Neuro: unresponsive to voice or tactile stimuli, blind GU: foley in place and draining  Resolved Hospital Problem list   N/A  Assessment & Plan:  Septic shock secondary to acute cystitis with microscopic hematuria and proctitis Plan: continue IVF, Cefepime ABX to start 11/04/21 as patient has already received Flagyl,Rocephin Im and Vancomycin today, F/U cultures  2. Acute kidney injury superimposed on CKD.  - Likely obstructive uropathy with urinary retention. Foley inserted Plan: hydrate, f/u BUN/Creatinine  3. Hx of Metastatic prostate cancer with recent ureteral obstruction and urethral erosion S/P bilateral ureteral stents placed because of hydronephrosis in 08/18/21. Plan: urology f/u. Patient was sent from urology clinic. Consider consult Dr. Huel Coventry once more hemodynamically stable.  4. Chronic anemia Plan: transfuse per protocol.  5. Left lung atelectasis Plan: incentive spirometry, flutter valve therapy.  6. Metabolic encephalopathy secondary to severe azotemia Plan: keep NPO, close monitor for improvement  7. Left leg swelling Plan: venous doppler  Family: Patient's brother [facility] was  updated on condition and goals of therapy.  He wanted patient to remain a FULL code for now.    Best Practice (right click and "Reselect all SmartList Selections" daily)    Diet/type: NPO DVT prophylaxis: prophylactic heparin  GI prophylaxis: N/A Lines: N/A Foley:  Yes, and it is still needed Code Status:  full code Last date of multidisciplinary goals of care discussion [11/03/2021]  Labs   CBC: Recent Labs  Lab 11/03/21 1631  WBC 12.0*  NEUTROABS 10.1*  HGB 8.3*  HCT 26.9*  MCV 105.5*  PLT 478    Basic Metabolic Panel: Recent Labs  Lab 11/03/21 1631  NA 145  K 4.8  CL 114*  CO2 22  GLUCOSE 118*  BUN 115*  CREATININE 6.08*  CALCIUM 8.3*   GFR: Estimated Creatinine Clearance: 8.7 mL/min (A) (by C-G formula based on SCr of 6.08 mg/dL (H)). Recent Labs  Lab 11/03/21 1630 11/03/21 1631  WBC  --  12.0*  LATICACIDVEN 1.5  --     Liver Function Tests: Recent Labs  Lab 11/03/21 1631  AST 13*  ALT 12  ALKPHOS 418*  BILITOT 0.9  PROT 7.3  ALBUMIN 2.8*   No results for input(s): LIPASE, AMYLASE in the last 168 hours. No results for input(s): AMMONIA in the last 168 hours.  ABG No results found for: PHART, PCO2ART, PO2ART, HCO3, TCO2, ACIDBASEDEF, O2SAT   Coagulation Profile: Recent Labs  Lab 11/03/21 1631  INR 1.7*    Cardiac Enzymes: No results for input(s): CKTOTAL, CKMB, CKMBINDEX, TROPONINI in the last 168 hours.  HbA1C: Hgb A1c MFr Bld  Date/Time Value Ref Range Status  09/07/2021 04:06 AM 6.1 (H) 4.8 - 5.6 % Final    Comment:    (NOTE) Pre diabetes:          5.7%-6.4%  Diabetes:              >6.4%  Glycemic control for   <7.0% adults with diabetes     CBG: No results for input(s): GLUCAP in the last 168 hours.  Review of Systems:   Unable to obtain. However patient's brother reported that patient was talkative up to 1400 hrs on day of admission  Past Medical History:  He,  has a past medical history of Adult failure to thrive, Anemia, Anxiety, Cancer ([facility]), CHF (congestive heart failure) ([facility]), Chronic kidney disease, Dementia ([facility]), Depression, Dysphagia, Foley catheter in place, GERD  (gastroesophageal reflux disease), Hematuria, History of blood transfusion (08/20/2021), Hypernatremia, Hyponatremia, Malnutrition ([place]), Obstructive and reflux uropathy, Physical debility, Severe sepsis with septic shock (CODE) ([place]), SVT (supraventricular tachycardia) ([place]), Urethral obstruction, and Urinary tract infection.   Surgical History:   Past Surgical History:  Procedure Laterality Date   CYSTOSCOPY WITH RETROGRADE PYELOGRAM, URETEROSCOPY AND STENT PLACEMENT Bilateral 08/18/2021   Procedure: 1.  Cystoscopy 2.  Fulguration of bladder;  Surgeon: Anthony Bring, MD;  Location: [facility];  Service: Urology;  Laterality: Bilateral;   IR NEPHRO TUBE REMOV/FL  09/06/2021   IR NEPHRO TUBE REMOV/FL  09/06/2021   IR NEPHROSTOMY PLACEMENT LEFT  08/19/2021   IR NEPHROSTOMY PLACEMENT RIGHT  08/19/2021   IR URETERAL STENT PLACEMENT EXISTING ACCESS LEFT  08/31/2021   IR URETERAL STENT PLACEMENT EXISTING ACCESS RIGHT  08/31/2021   TONSILECTOMY, ADENOIDECTOMY, BILATERAL MYRINGOTOMY AND TUBES       Social History:   reports that he has never smoked. He has never used smokeless tobacco. He reports that he does not drink alcohol and does not use  drugs.   Family History:  His family history is not on file.   Allergies Allergies  Allergen Reactions   Solanum Lycopersicum [Tomato]     Patient reports he is not allergic to tomato and he eats them all the time     Home Medications  Prior to Admission medications   Medication Sig Start Date End Date Taking? Authorizing Provider  acetaminophen (TYLENOL) 325 MG tablet Take 2 tablets (650 mg total) by mouth every 6 (six) hours as needed for mild pain or fever. 09/06/21   Samella Parr, NP  Cholecalciferol (VITAMIN D-3) 125 MCG (5000 UT) TABS Take 5,000 Units by mouth daily.    [provider]  folic acid (FOLVITE) 1 MG tablet Place 1 tablet (1 mg total) into feeding tube daily. 09/07/21   Samella Parr, NP  food thickener (SIMPLYTHICK,  HONEY/LEVEL 3/MODERATELY THICK,) LIQD Take 1 packet by mouth as needed. 09/06/21   Samella Parr, NP  ipratropium-albuterol (DUONEB) 0.5-2.5 (3) MG/3ML SOLN Take 3 mLs by nebulization every 4 (four) hours as needed (shortness of breath).    [provider]  LORazepam (ATIVAN) 2 MG/ML injection Inject 0.25 mLs (0.5 mg total) into the vein every 6 (six) hours as needed for sedation or anxiety. 09/06/21   Samella Parr, NP  magnesium oxide (MAG-OX) 400 (240 Mg) MG tablet Take 1 tablet (400 mg total) by mouth 2 (two) times daily. 09/06/21   Samella Parr, NP  metoprolol tartrate (LOPRESSOR) 5 MG/5ML SOLN injection Inject 5 mLs (5 mg total) into the vein every 5 (five) minutes as needed (sustained HR >329, hold for systolic BP less than 191). 09/06/21   Samella Parr, NP  Multiple Vitamin (MULTIVITAMIN WITH MINERALS) TABS tablet Place 1 tablet into feeding tube daily. 09/07/21   Samella Parr, NP  Nutritional Supplements (FEEDING SUPPLEMENT, OSMOLITE 1.2 CAL,) LIQD Place 1,000 mLs into feeding tube every 12 (twelve) hours. 09/06/21   Samella Parr, NP  Nutritional Supplements (FEEDING SUPPLEMENT, PROSOURCE TF,) liquid Place 45 mLs into feeding tube daily. 09/07/21   Samella Parr, NP  oxybutynin (DITROPAN) 5 MG tablet Place 0.5 tablets (2.5 mg total) into feeding tube 2 (two) times daily. 09/06/21   Samella Parr, NP  vitamin B-12 (CYANOCOBALAMIN) 1000 MCG tablet Take 2,000 mcg by mouth daily.    [provider]  Water For Irrigation, Sterile (FREE WATER) SOLN Place 125 mLs into feeding tube every 4 (four) hours. 09/06/21   Samella Parr, NP    Thank you for allowing me the privilege to care for this patient.  Critical care time: 75 minutes

## 2021-11-03 NOTE — ED Provider Notes (Signed)
[facility] DEPT Provider Note   CSN: [id number] Arrival date & time: 11/03/21  1507     History Sepsis  Anthony Dudley is a 87 y.o. male with past medical history significant for CHF, CKD, dementia, hypertension, prostate cancer who presents for evaluation under sepsis.  Per EMS patient was [facility] for [facility]-up appointment for pre op.  Has chronic indwelling Foley catheter with bilateral ureteral obstruction with stents and urethral erosion.  History of metastatic prostate cancer.  Plan on SP tube placement next week. While he was there he noted to be in urinary retention, febrile, tachycardic, tachypneic with soft blood pressures.  Was given IM Rocephin.  They were able to replace Foley catheter with greater than 800 cc dark, murky urine.  Per EMS patient is at his baseline mentation.  1 L IVF with EMS, Rocephin 41m IM PTA with Urology, Possible episode of emesis with EMS on arrival to urology  Level 5 Caveat- Dementia  Resides at [facility]  Collateral from brother BYarel Rushlowat [phone] was sick today, fever up to 102 at nursing facility.  COVID has been going around there.  Has had a mild cough. Normal mentation prior today while visiting patient.  Family verified patient is FULL CODE     HPI     Past Medical History:  Diagnosis Date   Adult failure to thrive    Anemia    Anxiety    Cancer ([facility]    metastatic prostate cancer   CHF (congestive heart failure) (HCC)    Chronic kidney disease    stage 3   Dementia (HCC)    Depression    Dysphagia    Foley catheter in place    GERD (gastroesophageal reflux disease)    Hematuria    History of blood transfusion 08/20/2021   Hypernatremia    Hyponatremia    hx of   Malnutrition (HCC)    Obstructive and reflux uropathy    Physical debility    Severe sepsis with septic shock (CODE) (HCC)    hx of   SVT (supraventricular tachycardia)  (HCC)    hx of   Urethral obstruction    bilateral   Urinary tract infection    hx of    Patient Active Problem List   Diagnosis Date Noted   Septic shock due to urinary tract infection ([place] 11/03/2021   Dysphagia 09/06/2021   Adult failure to thrive 09/06/2021   Anemia, chronic disease 138/93/7342  Folic acid deficiency 187/68/1157  Hypernatremia 09/06/2021   Acute respiratory failure with hypoxia ([place] 09/06/2021   Bilateral pneumonia 09/06/2021   SVT (supra[place]ricular tachycardia) ([place] 09/06/2021   Bilateral ureteral obstruction    Malnutrition of moderate degree 08/25/2021   Acute-on-chronic kidney injury ([facility] 126/20/3559  Metabolic acidosis 174/16/3845  Sepsis ([facility] 08/18/2021   UTI (urinary tract infection) due to urinary indwelling catheter ([place] 08/18/2021   Prostate cancer ([facility] 08/18/2021    Past Surgical History:  Procedure Laterality Date   CYSTOSCOPY WITH RETROGRADE PYELOGRAM, URETEROSCOPY AND STENT PLACEMENT Bilateral 08/18/2021   Procedure: 1.  Cystoscopy 2.  Fulguration of bladder;  Surgeon: BRaynelle Bring MD;  [facility]  Service: Urology;  Laterality: Bilateral;   IR NEPHRO TUBE REMOV/FL  09/06/2021   IR NEPHRO TUBE REMOV/FL  09/06/2021   IR NEPHROSTOMY PLACEMENT LEFT  08/19/2021   IR NEPHROSTOMY PLACEMENT RIGHT  08/19/2021   IR URETERAL STENT PLACEMENT EXISTING ACCESS LEFT  08/31/2021   IR URETERAL STENT PLACEMENT EXISTING ACCESS RIGHT  08/31/2021   TONSILECTOMY, ADENOIDECTOMY, BILATERAL MYRINGOTOMY AND TUBES         History reviewed. No pertinent family history.  Social History   Tobacco Use   Smoking status: Never   Smokeless tobacco: Never  Vaping Use   Vaping Use: Never used  Substance Use Topics   Alcohol use: Never   Drug use: Never    Home Medications Prior to Admission medications   Medication Sig Start Date End Date Taking? Authorizing Provider  acetaminophen (TYLENOL) 325 MG tablet Take 2 tablets (650 mg total) by  mouth every 6 (six) hours as needed for mild pain or fever. 09/06/21   Samella Parr, NP  Cholecalciferol (VITAMIN D-3) 125 MCG (5000 UT) TABS Take 5,000 Units by mouth daily.    [provider]  folic acid (FOLVITE) 1 MG tablet Place 1 tablet (1 mg total) into feeding tube daily. 09/07/21   Samella Parr, NP  food thickener (SIMPLYTHICK, HONEY/LEVEL 3/MODERATELY THICK,) LIQD Take 1 packet by mouth as needed. 09/06/21   Samella Parr, NP  ipratropium-albuterol (DUONEB) 0.5-2.5 (3) MG/3ML SOLN Take 3 mLs by nebulization every 4 (four) hours as needed (shortness of breath).    [provider]  LORazepam (ATIVAN) 2 MG/ML injection Inject 0.25 mLs (0.5 mg total) into the vein every 6 (six) hours as needed for sedation or anxiety. 09/06/21   Samella Parr, NP  magnesium oxide (MAG-OX) 400 (240 Mg) MG tablet Take 1 tablet (400 mg total) by mouth 2 (two) times daily. 09/06/21   Samella Parr, NP  metoprolol tartrate (LOPRESSOR) 5 MG/5ML SOLN injection Inject 5 mLs (5 mg total) into the vein every 5 (five) minutes as needed (sustained HR >476, hold for systolic BP less than 546). 09/06/21   Samella Parr, NP  Multiple Vitamin (MULTIVITAMIN WITH MINERALS) TABS tablet Place 1 tablet into feeding tube daily. 09/07/21   Samella Parr, NP  Nutritional Supplements (FEEDING SUPPLEMENT, OSMOLITE 1.2 CAL,) LIQD Place 1,000 mLs into feeding tube every 12 (twelve) hours. 09/06/21   Samella Parr, NP  Nutritional Supplements (FEEDING SUPPLEMENT, PROSOURCE TF,) liquid Place 45 mLs into feeding tube daily. 09/07/21   Samella Parr, NP  oxybutynin (DITROPAN) 5 MG tablet Place 0.5 tablets (2.5 mg total) into feeding tube 2 (two) times daily. 09/06/21   Samella Parr, NP  vitamin B-12 (CYANOCOBALAMIN) 1000 MCG tablet Take 2,000 mcg by mouth daily.    [provider]  Water For Irrigation, Sterile (FREE WATER) SOLN Place 125 mLs into feeding tube every 4 (four) hours.  09/06/21   Samella Parr, NP    Allergies    Solanum lycopersicum [tomato]  Review of Systems   Review of Systems  Unable to perform ROS: Dementia   Physical Exam Updated Vital Signs BP (!) 112/49    Pulse 86    Temp 99.1 F (37.3 C) (Rectal)    Resp (!) 23    Ht '5\' 9"'  (1.753 m)    Wt 76.7 kg    SpO2 100%    BMI 24.97 kg/m   Physical Exam Vitals and nursing note reviewed.  Constitutional:      General: He is not in acute distress.    Appearance: He is well-developed. He is ill-appearing. He is not diaphoretic.     Comments: Sleepy, arousal to loud voice  HENT:     Head: Normocephalic and atraumatic.  Mouth/Throat:     Mouth: Mucous membranes are dry.     Comments: Very dry mucous membranes, food still in buccal mucosa Eyes:     Pupils: Pupils are equal, round, and reactive to light.  Cardiovascular:     Rate and Rhythm: Regular rhythm. Tachycardia present.     Pulses: Normal pulses.          Radial pulses are 2+ on the right side and 2+ on the left side.       Dorsalis pedis pulses are 2+ on the right side and 2+ on the left side.     Heart sounds: Normal heart sounds.  Pulmonary:     Effort: Pulmonary effort is normal. Tachypnea present. No respiratory distress.     Comments: Tachypneic, coarse lung sounds Chest:     Comments: Non tender Abdominal:     General: Bowel sounds are normal. There is no distension.     Palpations: Abdomen is soft.     Tenderness: There is no abdominal tenderness. There is no guarding or rebound.     Comments: Soft  Genitourinary:    Comments: Foley catheter in place, draining dark brown, yellow murky thick urine Musculoskeletal:        General: No swelling, tenderness, deformity or signs of injury. Normal range of motion.     Cervical back: Normal range of motion and neck supple.     Comments: No bony tenderness, moves extremities spontaneously  Skin:    General: Skin is warm and dry.     Capillary Refill: Capillary refill takes  less than 2 seconds.  Neurological:     Mental Status: Mental status is at baseline.     Comments: At baseline per EMS.    ED Results / Procedures / Treatments   Labs (all labs ordered are listed, but only abnormal results are displayed) Labs Reviewed  COMPREHENSIVE METABOLIC PANEL - Abnormal; Notable for the following components:      Result Value   Chloride 114 (*)    Glucose, Bld 118 (*)    BUN 115 (*)    Creatinine, Ser 6.08 (*)    Calcium 8.3 (*)    Albumin 2.8 (*)    AST 13 (*)    Alkaline Phosphatase 418 (*)    GFR, Estimated 8 (*)    All other components within normal limits  CBC WITH DIFFERENTIAL/PLATELET - Abnormal; Notable for the following components:   WBC 12.0 (*)    RBC 2.55 (*)    Hemoglobin 8.3 (*)    HCT 26.9 (*)    MCV 105.5 (*)    RDW 20.3 (*)    Neutro Abs 10.1 (*)    Abs Immature Granulocytes 0.12 (*)    All other components within normal limits  PROTIME-INR - Abnormal; Notable for the following components:   Prothrombin Time 20.0 (*)    INR 1.7 (*)    All other components within normal limits  APTT - Abnormal; Notable for the following components:   aPTT 46 (*)    All other components within normal limits  URINALYSIS, ROUTINE W REFLEX MICROSCOPIC - Abnormal; Notable for the following components:   APPearance TURBID (*)    Hgb urine dipstick MODERATE (*)    Protein, ur >=300 (*)    Leukocytes,Ua MODERATE (*)    Bacteria, UA MANY (*)    All other components within normal limits  RESP PANEL BY RT-PCR (FLU A&B, COVID) ARPGX2  CULTURE, BLOOD (ROUTINE X 2)  CULTURE, BLOOD (ROUTINE X 2)  URINE CULTURE  LACTIC ACID, PLASMA  LACTIC ACID, PLASMA  CBC  CREATININE, SERUM  CBC  BASIC METABOLIC PANEL  MAGNESIUM  PHOSPHORUS    EKG None  Radiology DG Chest Port 1 View  Result Date: 11/03/2021 CLINICAL DATA:  Question sepsis.  Metastatic prostate cancer. EXAM: PORTABLE CHEST 1 VIEW COMPARISON:  09/07/2021 FINDINGS: Heart size is normal. Chronic  aortic atherosclerotic calcification. There appears to be some volume loss in the left lower lobe that could be simple atelectasis or atelectatic pneumonia. The lungs are otherwise clear. Widespread sclerotic bone density presumably related to metastatic prostate cancer. IMPRESSION: Volume loss in the left lower lobe that could be simple atelectasis or atelectatic pneumonia. Widespread sclerotic bone changes of metastatic prostate cancer. Electronically Signed   By: Nelson Chimes M.D.   On: 11/03/2021 15:47   CT CHEST ABDOMEN PELVIS WO CONTRAST  Result Date: 11/03/2021 CLINICAL DATA:  Sepsis.  Infected urine. EXAM: CT CHEST, ABDOMEN AND PELVIS WITHOUT CONTRAST TECHNIQUE: Multidetector CT imaging of the chest, abdomen and pelvis was performed following the standard protocol without IV contrast. COMPARISON:  CT abdomen and pelvis 08/19/2021. FINDINGS: CT CHEST FINDINGS Cardiovascular: The aorta is ectatic without focal dilatation. Heart size is within normal limits. There is no pericardial effusion. There are atherosclerotic calcifications of the aorta and coronary arteries. Mediastinum/Nodes: No enlarged mediastinal, hilar, or axillary lymph nodes. Thyroid gland, trachea, and esophagus demonstrate no significant findings. Lungs/Pleura: There is a trace left pleural effusion. Evaluation of lung parenchyma is significantly limited secondary to respiratory motion artifact. There is smooth interlobular septal thickening in the upper lungs. There is some atelectatic changes in the left lower lobe focal slightly nodular area in the superior segment of the left lower lobe measures 1.2 by 1.0 by 0.8 mm image 5/47 and 7/143. There is no evidence for pneumothorax. Trachea and central airways are patent. There is a 6 mm nodular density in the right lower lobe image 5/94. Musculoskeletal: Diffuse sclerotic osseous metastatic disease is present. No acute fractures. CT ABDOMEN PELVIS FINDINGS Hepatobiliary: No focal liver  abnormality is seen. No gallstones, gallbladder wall thickening, or biliary dilatation. Pancreas: Unremarkable. No pancreatic ductal dilatation or surrounding inflammatory changes. Spleen: Normal in size without focal abnormality. Adrenals/Urinary Tract: Bilateral ureteral stents are present. There is mild bilateral hydronephrosis which has decreased compared to the prior examination. There are bilateral calcifications in the renal calices. The largest is in the inferior pole the right kidney measuring 12 mm. No perinephric fluid collection. No ureteral calculi. The bladder is decompressed by Foley catheter. Stomach/Bowel: Stomach is within normal limits. Appendix appears normal. No bowel obstruction. There is sigmoid and descending colon diverticulosis without evidence for acute diverticulitis. There is questionable mild rectal wall thickening. There is minimal presacral edema. Vascular/Lymphatic: Aortic atherosclerosis. No enlarged abdominal or pelvic lymph nodes. Reproductive: Uterus and bilateral adnexa are unremarkable. Other: There is no ascites or focal abdominal wall hernia. There are minimal subcutaneous densities in the anterior abdominal wall likely related to medication injection sites. Bilateral gynecomastia. Musculoskeletal: Diffuse osseous sclerotic metastatic disease again seen. There is stable compression deformity of L1. No acute fractures are seen. Degenerative changes affect both hips. IMPRESSION: 1. Trace left pleural effusion. 2. Limited evaluation of lung parenchyma secondary to motion artifact. Peripheral interstitial opacities favored is mild edema. 3. There are 2 focal nodular densities in the lungs with the largest in the superior segment of the left lower lobe measuring up to 12 mm.  This may be infectious/inflammatory, but other etiologies are not excluded. Consider one of the following in 3 months for both low-risk and high-risk individuals: (a) repeat chest CT, (b) follow-up PET-CT, or  (c) tissue sampling. This recommendation follows the consensus statement: Guidelines for Management of Incidental Pulmonary Nodules Detected on CT Images: From the Fleischner Society 2017; Radiology 2017; 284:228-243. 4. Bilateral ureteral stents in place. Mild bilateral hydronephrosis, decreased from prior. 5. Bilateral caliceal calculi.  No ureteral calculi. 6. Foley catheter decompresses the bladder. 7. Mild rectal wall thickening and inflammation worrisome for proctitis. 8. Stable diffuse osseous sclerotic metastatic disease. Electronically Signed   By: Ronney Asters M.D.   On: 11/03/2021 17:55    Procedures .Critical Care Performed by: Nettie Elm, PA-C Authorized by: Nettie Elm, PA-C   Critical care provider statement:    Critical care time (minutes):  76   Critical care time was exclusive of:  Separately billable procedures and treating other patients and teaching time   Critical care was necessary to treat or prevent imminent or life-threatening deterioration of the following conditions:  Sepsis, shock, renal failure, respiratory failure and dehydration   Critical care was time spent personally by me on the following activities:  Development of treatment plan with patient or surrogate, discussions with consultants, evaluation of patient's response to treatment, examination of patient, ordering and review of laboratory studies, ordering and review of radiographic studies, ordering and performing treatments and interventions, pulse oximetry, re-evaluation of patient's condition, review of old charts, obtaining history from patient or surrogate and blood draw for specimens   Care discussed with: admitting provider     Medications Ordered in ED Medications  lactated ringers infusion ( Intravenous New Bag/Given 11/03/21 1638)  norepinephrine (LEVOPHED) 14m in 2560m(0.016 mg/mL) premix infusion (5 mcg/min Intravenous Rate/Dose Change 11/03/21 2000)  docusate sodium (COLACE)  capsule 100 mg (has no administration in time range)  polyethylene glycol (MIRALAX / GLYCOLAX) packet 17 g (has no administration in time range)  heparin injection 5,000 Units (has no administration in time range)  lactated ringers bolus 1,000 mL (0 mLs Intravenous Stopped 11/03/21 1738)  ceFEPIme (MAXIPIME) 2 g in sodium chloride 0.9 % 100 mL IVPB (0 g Intravenous Stopped 11/03/21 2000)  metroNIDAZOLE (FLAGYL) IVPB 500 mg (0 mg Intravenous Stopped 11/03/21 2000)  vancomycin (VANCOCIN) IVPB 1000 mg/200 mL premix (0 mg Intravenous Stopped 11/03/21 2000)  acetaminophen (TYLENOL) suppository 650 mg (650 mg Rectal Given 11/03/21 1620)  lactated ringers bolus 1,000 mL (0 mLs Intravenous Stopped 11/03/21 1750)  lactated ringers bolus 500 mL (500 mLs Intravenous New Bag/Given 11/03/21 2000)    ED Course  I have reviewed the triage vital signs and the nursing notes.  Pertinent labs & imaging results that were available during my care of the patient were reviewed by me and considered in my medical decision making (see chart for details).  [age]ear old here for evaluation for possible sepsis.  EMS he was febrile, tachycardic, tachypneic, soft blood pressures.  Recently seen by urology for occluded foley cath which is chronic. Patient appears clinically dehydrated, warm to the touch.  He has a dark brown, yellow thick urine draining from his Foley catheter which was replaced by urology with greater than 800 cc output just prior to arrival.  History of dementia according to EMS this is baseline mentation.  Has some coarse lung sounds, appears to have partially chewed food in his buccal mucosa concern for aspiration PNA.  Abdomen soft, nontender.  No obvious  cellulitis on exam.  Code sepsis called on initial evaluation.  Given history of heart failure we will start out with 1 L of fluids, give broad-spectrum antibiotics given unclear if respiratory or urine source.  COVID is also going around his nursing facility  apparently.  Hypotensive with nursing 80 systolic, MAP < 60 on recheck. Will give 2 L LR. Already given 1 L IVF with EMS PTA to complete 30cc/kg bolus for sepsis protocol.  Labs and imaging personally reviewed and interpreted:  CBC leukocytosis at 12.0, hemoglobin 8.3 CMP creatinine 6.08, up from baseline at 2, alk phos 418, improved Lactic 1.5 BC obtained UA positive for infection DG chest with possible atelectasis vs PNA COVID/Flu neg EKG sinus tachycardia CT C/A/P with mild edema, opacities likely infectious, possible proctitis?  1640: Patient reassessed. BP 774 systolic, MAP >12  8786: BP MAP> 65, tachypnea into 30's without hypoxia, placed on 2 L [place]  1830: Blood pressure trending down.  Last for recheck maps less than 65.  Last blood pressure 69 systolic.  He is ready had a 34 cc/kg bolus.  Will start on Levophed, admit to PCCM.  1855: Levo at 7.5 in room> BP improve, mentation at baseline  Patient critically ill with septic shock.  I updated family on status.  He is full code.  Received 30/cc/kg bolus, broad-spectrum antibiotics, acute on chronic renal failure likely due to dehydration and prerenal cause due to prior clogged foley.  CONSULT with PCCM, will admit for further management  The patient appears reasonably stabilized for admission considering the current resources, flow, and capabilities available in the ED at this time, and I doubt any other [facility] requiring further screening and/or treatment in the ED prior to admission.     MDM Rules/Calculators/A&P                             Final Clinical Impression(s) / ED Diagnoses Final diagnoses:  Sepsis without acute organ dysfunction, due to unspecified organism [facility])  Acute renal failure superimposed on chronic kidney disease, unspecified CKD stage, unspecified acute renal failure type ([place])  Acute prostatitis  Acute cystitis without hematuria  Community acquired pneumonia, unspecified laterality    Rx / DC  Orders ED Discharge Orders     None        Aashika Carta A, PA-C 11/03/21 2031    Lacretia Leigh, MD 11/07/21 [phone]

## 2021-11-03 NOTE — Progress Notes (Signed)
[name] Progress Note Patient Name: Anthony Dudley DOB: 06-Nov-1933 MRN: [id number]   Date of Service  11/03/2021  HPI/Events of Note  K+ = 4.3.  eICU Interventions  Continue present management.      Intervention Category Major Interventions: Other:  Lysle Dingwall 11/03/2021, 11:58 PM

## 2021-11-03 NOTE — Sepsis Progress Note (Signed)
Notified bedside nurse of need to draw lactic acid.  

## 2021-11-03 NOTE — ED Notes (Signed)
Patient returned back from CT at this time.  °

## 2021-11-03 NOTE — Progress Notes (Signed)
A consult was received from an ED physician for vancomycin and cefepime per pharmacy dosing (for an indication other than meningitis). The patient's profile has been reviewed for ht/wt/allergies/indication/available labs. A one time order has been placed for the above antibiotics.  Further antibiotics/pharmacy consults should be ordered by admitting physician if indicated.                       Reuel Boom, PharmD, BCPS [phone] 11/03/2021, 4:24 PM

## 2021-11-03 NOTE — ED Provider Notes (Signed)
I provided a substantive portion of the care of this patient.  I personally performed the entirety of the medical decision making for this encounter.   87 year old male who presents from urologist office due to concern for infected urine.  Patient is febrile here.  Sepsis protocol started.  Possible aspiration as he has been short of breath.  Plan is for patient be admitted   Lacretia Leigh, MD 11/03/21 1623

## 2021-11-03 NOTE — ED Triage Notes (Signed)
Pt BIB EMS from outside facility. Pt transported to medical appointment. Per EMS, baseline vitals obtained and urologist wanted him brought up for lab workup. Foley cath placed today with 1 liter removed. Alert and able to follow commands. Given 1 gram of Rocephin at facility.   BP 92/56 P 110 RR 24 T 100.4 SpO2 96% RA

## 2021-11-03 NOTE — Progress Notes (Signed)
[facility] Progress Note Patient Name: Anthony Dudley DOB: [birth year] MRN: [id number]   Date of Service  11/03/2021  HPI/Events of Note  Multiple issues: 1. HR = 43 to 70's. Last K+ 4.8 6 hours ago. Patient now on Norepinephrine at 9 mcg/min via PIV. Last LVEF = 55-60%.  eICU Interventions  Plan: BMP STAT. Bolus with 0.9 NaCl 1 liter IV over 1 hour now.      Intervention Category Major Interventions: Hypertension - evaluation and management;Arrhythmia - evaluation and management  Deamonte Sayegh Eugene 11/03/2021, 11:21 PM

## 2021-11-03 NOTE — Sepsis Progress Note (Signed)
eLink monitoring sepsis protocol °

## 2021-11-04 ENCOUNTER — Inpatient Hospital Stay (HOSPITAL_COMMUNITY): Payer: Medicare Other

## 2021-11-04 DIAGNOSIS — R609 Edema, unspecified: Secondary | ICD-10-CM

## 2021-11-04 DIAGNOSIS — N390 Urinary tract infection, site not specified: Secondary | ICD-10-CM | POA: Diagnosis not present

## 2021-11-04 DIAGNOSIS — R6521 Severe sepsis with septic shock: Secondary | ICD-10-CM | POA: Diagnosis not present

## 2021-11-04 DIAGNOSIS — A419 Sepsis, unspecified organism: Secondary | ICD-10-CM | POA: Diagnosis not present

## 2021-11-04 LAB — BASIC METABOLIC PANEL
Anion gap: 9 (ref 5–15)
BUN: 102 mg/dL — ABNORMAL HIGH (ref 8–23)
CO2: 19 mmol/L — ABNORMAL LOW (ref 22–32)
Calcium: 7.8 mg/dL — ABNORMAL LOW (ref 8.9–10.3)
Chloride: 113 mmol/L — ABNORMAL HIGH (ref 98–111)
Creatinine, Ser: 4.83 mg/dL — ABNORMAL HIGH (ref 0.61–1.24)
GFR, Estimated: 11 mL/min — ABNORMAL LOW (ref >60)
Glucose, Bld: 117 mg/dL — ABNORMAL HIGH (ref 70–99)
Potassium: 3.7 mmol/L (ref 3.5–5.1)
Sodium: 141 mmol/L (ref 135–145)

## 2021-11-04 LAB — GLUCOSE, CAPILLARY
Glucose-Capillary: 111 mg/dL — ABNORMAL HIGH (ref 70–99)
Glucose-Capillary: 110 mg/dL — ABNORMAL HIGH (ref 70–99)
Glucose-Capillary: 87 mg/dL (ref 70–99)
Glucose-Capillary: 87 mg/dL (ref 70–99)
Glucose-Capillary: 81 mg/dL (ref 70–99)
Glucose-Capillary: 74 mg/dL (ref 70–99)

## 2021-11-04 LAB — CBC
HCT: 22.7 % — ABNORMAL LOW (ref 39.0–52.0)
Hemoglobin: 7 g/dL — ABNORMAL LOW (ref 13.0–17.0)
MCH: 32.7 pg (ref 26.0–34.0)
MCHC: 30.8 g/dL (ref 30.0–36.0)
MCV: 106.1 fL — ABNORMAL HIGH (ref 80.0–100.0)
Platelets: 257 10*3/uL (ref 150–400)
RBC: 2.14 MIL/uL — ABNORMAL LOW (ref 4.22–5.81)
RDW: 20.3 % — ABNORMAL HIGH (ref 11.5–15.5)
WBC: 11.4 10*3/uL — ABNORMAL HIGH (ref 4.0–10.5)
nRBC: 0 % (ref 0.0–0.2)

## 2021-11-04 LAB — MAGNESIUM: Magnesium: 2.9 mg/dL — ABNORMAL HIGH (ref 1.7–2.4)

## 2021-11-04 LAB — PHOSPHORUS: Phosphorus: 3 mg/dL (ref 2.5–4.6)

## 2021-11-04 MED ORDER — LACTATED RINGERS IV SOLN: INTRAVENOUS | Status: DC

## 2021-11-04 MED ORDER — CHLORHEXIDINE GLUCONATE 0.12 % MT SOLN
15.0000 mL | Freq: Two times a day (BID) | OROMUCOSAL | Status: DC
  Administered 2021-11-04 – 2021-12-06 (×60): 15 mL via OROMUCOSAL
  Filled 2021-11-04 (×50): qty 15

## 2021-11-04 MED ORDER — VANCOMYCIN VARIABLE DOSE PER UNSTABLE RENAL FUNCTION (PHARMACIST DOSING): Status: DC

## 2021-11-04 MED ORDER — LACTATED RINGERS IV BOLUS
500.0000 mL | Freq: Once | INTRAVENOUS | Status: AC
Start: 2021-11-04 — End: 2021-11-04
  Administered 2021-11-04: 18:00:00 500 mL via INTRAVENOUS

## 2021-11-04 MED ORDER — ORAL CARE MOUTH RINSE
15.0000 mL | Freq: Two times a day (BID) | OROMUCOSAL | Status: DC
  Administered 2021-11-04 – 2021-11-23 (×32): 15 mL via OROMUCOSAL

## 2021-11-04 MED ORDER — FOOD THICKENER (SIMPLYTHICK): 1.0000 | ORAL | Status: DC | PRN

## 2021-11-04 NOTE — Progress Notes (Addendum)
NAMECalix Dudley, MRN:  [id number], DOB:  Sep 09, 1933, LOS: 1 ADMISSION DATE:  11/03/2021, CONSULTATION DATE:  11/03/2021 REFERRING MD:  Lacretia Leigh, MD CHIEF COMPLAINT:  fever   History of Present Illness:  [name] is an 87 year old gentleman with PMH significant for CKD, hypertension, CHF, dementia and prostate cancer. He was hospitalized at [facility] hospital in 10/22 because of AKI superimposed on CKD secondary to bilateral hydronephrosis and urethral erosion resulting in Bilateral CYSTOSCOPY WITH RETROGRADE PYELOGRAM,  AND BILATERAL STENTS with Raynelle Bring, MD. The patient had a scheduled pre op visit with urology office today and was found to be pyrexial and tachycardiac. He was diagnosed with urinary retention so a foley cath was placed which evacuated > 800 ml dark murky urine.  Hence he was treated with IM Rocephin and sent to [facility] ED for further evaluation.  At [facility], the patient was found to be septic with pyuria. He was treated with sepsis protocol IVF bolus but needed Levophed for refractory hypotension. Urine and blood cultures were sent off and he was treated with Flagyl and Vancomycin.  He was referred for ICU admission because of Levophed requirement.  I have seen and examined the patient. He was unable to contribute to this consultation because of impaired mental status. The patient's brother was concerned about possible COVID 19 infection since it has been prevalent at the [facility].  However patient's PCR SARS was negative.  Pertinent  Medical History  Metastatic prostate cancer with hx of hydronephrosis that required ureteral stents.  Significant Hospital Events: Including procedures, antibiotic start and stop dates in addition to other pertinent events   Cefepime 11/04/21. Received Flagyl and Vancomycin 11/03/21  Interim History / Subjective:  N/A  Objective   Blood pressure (!) 121/41, pulse (!) 49, temperature 97.7 F (36.5 C), temperature source Oral, resp. rate  15, height 5\' 9"  (1.753 m), weight 74 kg, SpO2 100 %.        Intake/Output Summary (Last 24 hours) at 11/04/2021 1034 Last data filed at 11/04/2021 1000 Gross per 24 hour  Intake 5181.41 ml  Output 1625 ml  Net 3556.41 ml   Filed Weights   11/03/21 1745 11/04/21 0500  Weight: 76.7 kg 74 kg    Examination: General: Chronically and acutely ill older adult M NAD  HENT: NCAT. Dry MM. Trachea midline  Lungs: Even, unlabored. No rhonchi  Cardiovascular: rr. S1s2 cap refill < 3 sec 1+ peripheral pulses  Abdomen: soft ndnt  Extremities: Muscle atrophy. LLE edema  Neuro: Blind. Following commands, weak. Lethargic  GU: foley, clear yellow urine   Resolved Hospital Problem list   N/A  Assessment & Plan:    Acute metabolic encephalopathy due to septic shock, azotemia, superimposed on baseline dementia P -sepsis as below -delirium precautions  Septic shock due to acute cystitis, proctitis P -IVF -vanc cefepime -NE for SBP > 90  -follow Cx data   Obstructive AKI on CKD III -query component of pre-renal 2/2 dehydration as well   P -maintain foley  -trend renal indices UOP   Metastatic prostate cancer with Ureteral obstruction and erosion Hydronephrosis s/p bilat stents 08/18/21 P -uro consult when stable  LLE edema P -lower extremity doppler, r/o DVT   Chronic anemia P -trend H/H -goal hgb >7  Hx HTN -hold home meds in setting of shock    Best Practice (right click and "Reselect all SmartList Selections" daily)   Diet/type: NPO DVT prophylaxis: prophylactic heparin  GI prophylaxis: N/A  Lines: N/A Foley:  Yes, and it is still needed Code Status:  full code Last date of multidisciplinary goals of care discussion [11/03/2021 -- admitting provider d/w pt brother]   Labs   CBC: Recent Labs  Lab 11/03/21 1631 11/03/21 2229 11/04/21 0235  WBC 12.0* 11.7* 11.4*  NEUTROABS 10.1*  --   --   HGB 8.3* 7.2* 7.0*  HCT 26.9* 23.0* 22.7*  MCV 105.5* 102.7*  106.1*  PLT 290 242 751    Basic Metabolic Panel: Recent Labs  Lab 11/03/21 1631 11/03/21 2229 11/04/21 0235  NA 145 143 141  K 4.8 4.3 3.7  CL 114* 114* 113*  CO2 22 18* 19*  GLUCOSE 118* 135* 117*  BUN 115* 111* 102*  CREATININE 6.08* 5.40*   5.25* 4.83*  CALCIUM 8.3* 8.2* 7.8*  MG  --   --  2.9*  PHOS  --   --  3.0   GFR: Estimated Creatinine Clearance: 11 mL/min (A) (by C-G formula based on SCr of 4.83 mg/dL (H)). Recent Labs  Lab 11/03/21 1630 11/03/21 1631 11/03/21 2229 11/04/21 0235  WBC  --  12.0* 11.7* 11.4*  LATICACIDVEN 1.5  --  1.1  --     Liver Function Tests: Recent Labs  Lab 11/03/21 1631  AST 13*  ALT 12  ALKPHOS 418*  BILITOT 0.9  PROT 7.3  ALBUMIN 2.8*   No results for input(s): LIPASE, AMYLASE in the last 168 hours. No results for input(s): AMMONIA in the last 168 hours.  ABG No results found for: PHART, PCO2ART, PO2ART, HCO3, TCO2, ACIDBASEDEF, O2SAT   Coagulation Profile: Recent Labs  Lab 11/03/21 1631  INR 1.7*    Cardiac Enzymes: No results for input(s): CKTOTAL, CKMB, CKMBINDEX, TROPONINI in the last 168 hours.  HbA1C: Hgb A1c MFr Bld  Date/Time Value Ref Range Status  09/07/2021 04:06 AM 6.1 (H) 4.8 - 5.6 % Final    Comment:    (NOTE) Pre diabetes:          5.7%-6.4%  Diabetes:              >6.4%  Glycemic control for   <7.0% adults with diabetes     CBG: Recent Labs  Lab 11/03/21 2304 11/04/21 0345 11/04/21 0805  GLUCAP 128* 111* 110*   CRITICAL CARE Performed by: Cristal Generous   Total critical care time: 37 minutes  Critical care time was exclusive of separately billable procedures and treating other patients.  Critical care was necessary to treat or prevent imminent or life-threatening deterioration.  Critical care was time spent personally by me on the following activities: development of treatment plan with patient and/or surrogate as well as nursing, discussions with consultants, evaluation of  patient's response to treatment, examination of patient, obtaining history from patient or surrogate, ordering and performing treatments and interventions, ordering and review of laboratory studies, ordering and review of radiographic studies, pulse oximetry and re-evaluation of patient's condition.  Eliseo Gum MSN, AGACNP-BC [place] for pager  11/04/2021, 10:34 AM   Attending Note:  I have examined patient, reviewed labs, studies and notes.   Interval events: Remains on norepinephrine 4 More awake and interacting  Vitals:   11/04/21 1045 11/04/21 1100 11/04/21 1115 11/04/21 1130  BP: (!) 118/55 (!) 116/40 (!) 116/49 (!) 103/55  Pulse:   (!) 105 79  Resp: 20 18 (!) 30 (!) 23  Temp:      TempSrc:      SpO2: 97%  98% 94% 96%  Weight:      Height:      Chronically ill-appearing elderly man, sitting up in bed in no distress.  He is blind.  Interacts, follows commands, moves his extremities.  Lungs are clear bilaterally.  Heart is regular without a murmur.  Abdomen is nondistended with positive bowel sounds.  Left greater than right lower extremity edema.  Foley catheter in place with clear yellow urine.  Septic shock due to acute cystitis, proctitis.  Currently covered with broad antibiotics, vancomycin and cefepime.  Plan to tailor based on culture data.  Wean his norepinephrine as able, MAP goal 65.  Has received 3.7 L volume resuscitation.  Acute on chronic stage III renal failure likely principally postobstructive uropathy but also possible component of ATN from his septic shock.  Continue to follow urine output, BMP with resolution of his obstruction, treatment of his hypotension.  Acute metabolic encephalopathy due to septic shock, renal failure superimposed on baseline dementia.  Working to treat underlying causes.  Continue delirium precautions  Metastatic prostate cancer with bilateral ureteral stents, followed by urology.  Suspect we will  leave an indwelling Foley to ensure no more obstruction, then plan for outpatient suprapubic catheter.  He is on Zytiga as an outpatient, plan to restart when he is taking good p.o.  Left lower extremity edema, at risk for DVT given malignancy.  Plan for lower extremity Doppler ultrasound to evaluate.  History of hypertension.  Home antihypertensive regimen currently on hold, but will restart when able to do so.  Independent critical care time is 32 minutes.   Baltazar Apo, MD, PhD 11/04/2021, 11:49 AM [place] [facility] [facility] [phone] or if no answer [phone]

## 2021-11-04 NOTE — Progress Notes (Signed)
Pharmacy Antibiotic Note  Anthony Dudley is a 87 y.o. male admitted on 11/03/2021 with sepsis.  Pharmacy has been consulted for Vancomycin dosing.  SCr on admit 5.25 > 4.83, Cl ~ 11 CG WBC sl elevated, Now Afeb  Plan: Cefepime 2gm 12/21, cont Cefepime 1gm q24 Vanc 1gm 12/21 ~ 1800 Obtain Vanc level in am 12/23  Height: 5\' 9"  (175.3 cm) Weight: 74 kg (163 lb 2.3 oz) IBW/kg (Calculated) : 70.7  Temp (24hrs), Avg:99.1 F (37.3 C), Min:97.7 F (36.5 C), Max:101.4 F (38.6 C)  Recent Labs  Lab 11/03/21 1630 11/03/21 1631 11/03/21 2229 11/04/21 0235  WBC  --  12.0* 11.7* 11.4*  CREATININE  --  6.08* 5.40*   5.25* 4.83*  LATICACIDVEN 1.5  --  1.1  --     Estimated Creatinine Clearance: 11 mL/min (A) (by C-G formula based on SCr of 4.83 mg/dL (H)).    Allergies  Allergen Reactions   Solanum Lycopersicum [Tomato]     Patient reports he is not allergic to tomato and he eats them all the time   Antimicrobials this admission: 2/21 Flagyl x 1  12/21 Cefepime >>  12/21 Vancomycin >>  Dose adjustments this admission:  Microbiology results: 12/21 BCx: ngtd 12/21 UCx: sent 12/21 MRSA PCR: pos  Thank you for allowing pharmacy to be a part of this patients care.  Minda Ditto PharmD 11/04/2021 11:28 AM

## 2021-11-04 NOTE — Progress Notes (Signed)
Left lower extremity venous duplex completed. Refer to "CV Proc" under chart review to view preliminary results.  11/04/2021 1:43 PM Kelby Aline., MHA, RVT, RDCS, RDMS

## 2021-11-04 NOTE — Evaluation (Signed)
SLP Cancellation Note  Patient Details Name: Tajae Rybicki MRN: [id number] DOB: [birth year]   Cancelled treatment:       Reason Eval/Treat Not Completed: Other (comment) (in communication with RN, pt lethargic at this time, will continue efforts)   Macario Golds 11/04/2021, 8:52 AM   Kathleen Lime, MS [place] Office [phone] Pager [phone]

## 2021-11-04 NOTE — Evaluation (Addendum)
Clinical/Bedside Swallow Evaluation Patient Details  Name: Anthony Dudley MRN: [id number] Date of Birth: 1933-10-24  Today's Date: 11/04/2021 Time: SLP Start Time (ACUTE ONLY): 31 SLP Stop Time (ACUTE ONLY): 1037 SLP Time Calculation (min) (ACUTE ONLY): 32 min  Past Medical History:  Past Medical History:  Diagnosis Date   Adult failure to thrive    Anemia    Anxiety    Cancer (HCC)    metastatic prostate cancer   CHF (congestive heart failure) (HCC)    Chronic kidney disease    stage 3   Dementia (HCC)    Depression    Dysphagia    Foley catheter in place    GERD (gastroesophageal reflux disease)    Hematuria    History of blood transfusion 08/20/2021   Hypernatremia    Hyponatremia    hx of   Malnutrition ([place])    Obstructive and reflux uropathy    Physical debility    Severe sepsis with septic shock (CODE) (HCC)    hx of   SVT (supraventricular tachycardia) (HCC)    hx of   Urethral obstruction    bilateral   Urinary tract infection    hx of   Past Surgical History:  Past Surgical History:  Procedure Laterality Date   CYSTOSCOPY WITH RETROGRADE PYELOGRAM, URETEROSCOPY AND STENT PLACEMENT Bilateral 08/18/2021   Procedure: 1.  Cystoscopy 2.  Fulguration of bladder;  Surgeon: Raynelle Bring, MD;  Location: [place];  Service: Urology;  Laterality: Bilateral;   IR NEPHRO TUBE REMOV/FL  09/06/2021   IR NEPHRO TUBE REMOV/FL  09/06/2021   IR NEPHROSTOMY PLACEMENT LEFT  08/19/2021   IR NEPHROSTOMY PLACEMENT RIGHT  08/19/2021   IR URETERAL STENT PLACEMENT EXISTING ACCESS LEFT  08/31/2021   IR URETERAL STENT PLACEMENT EXISTING ACCESS RIGHT  08/31/2021   TONSILECTOMY, ADENOIDECTOMY, BILATERAL MYRINGOTOMY AND TUBES     HPI:  87 year old gentleman with PMH significant for CKD, hypertension, GERD, CHF, dementia and prostate cancer. He was hospitalized at [facility] hospital in 10/22, surgery with urology 12/15 - now w/ Urinary retention, CT imaging, ascites, left lower lobe nodule/  ? Infection vs inflammatory 11/04/2021.  Pt possibly aspirated with dyspnea and speech/swallow eval ordered.  During October admission, pt had a short term feeding tube placed.    Assessment / Plan / Recommendation  Clinical Impression  Pt notable for dysarthria and impulsivity with known h/o dysphagia and likely episodic aspiration.  Today pt requesting water repeatedly.  Oral care provided but once SLP informed pt he was in the hospital, he stated he was at home and raised voice to SLP.  He only would accept po intake and did not allow further oral care.  Was able to remove minimal amounts of viscous brown tinged secretions from oral cavity.  Pt provided with liquids and consumed them sequentially via straw without resting - resulting in dyspnea and cough - suspicious for aspiration due to likely decreased sustained laryngeal closure.   He raised his voice to SLP when provided with small single boluses but became more accepting after significant coughing episode with mild desaturation *to high 80's.    Due to pt's impulsivity, likely impaired oral control d/t dysarthria - recommend to consider nectar liquids and pureed diet with strict precautions.  Pt will require total assist to assure small boluses accepted and to allow rest breaks.    SLP will follow up for dysphagia management.  Of note, during prior admit in Oct 2022, he required short term feeding  tube.  Pt did not complain with any liquid intake, including cold water nectar thick.   SLP Visit Diagnosis: Dysphagia, unspecified (R13.10)    Aspiration Risk  Moderate aspiration risk (d/t lingual weakness/discoordination and impulsivity)    Diet Recommendation Dysphagia 1 (Puree);Nectar-thick liquid   Liquid Administration via: Straw Medication Administration: Whole meds with puree (crush if large) Supervision: Full supervision/cueing for compensatory strategies Compensations: Slow rate;Small sips/bites Postural Changes: Seated upright at  90 degrees;Remain upright for at least 30 minutes after po intake    Other  Recommendations Oral Care Recommendations: Oral care BID Other Recommendations: Order thickener from pharmacy    Recommendations for follow up therapy are one component of a multi-disciplinary discharge planning process, led by the attending physician.  Recommendations may be updated based on patient status, additional functional criteria and insurance authorization.  Follow up Recommendations Follow physician's recommendations for discharge plan and follow up therapies      Assistance Recommended at Discharge    Functional Status Assessment Patient has had a recent decline in their functional status and/or demonstrates limited ability to make significant improvements in function in a reasonable and predictable amount of time  Frequency and Duration min 2x/week  2 weeks       Prognosis Prognosis for Safe Diet Advancement: Fair Barriers to Reach Goals: Time post onset Barriers/Prognosis Comment: recurrent hospitalizations *Oct admitted* and chronic nature of deficits potentially decreasing pt's functional reserve, tolerance of intermittent aspiration      Swallow Study   General Date of Onset: 11/04/21 HPI: 87 year old gentleman with PMH significant for CKD, hypertension, GERD, CHF, dementia and prostate cancer. He was hospitalized at [facility] hospital in 10/22, surgery with urology 12/15 - now w/ Urinary retention, CT imaging, ascites, left lower lobe nodule/ ? Infection vs inflammatory 11/04/2021.  Pt possibly aspirated with dyspnea and speech/swallow eval ordered.  During October admission, pt had a short term feeding tube placed. Type of Study: Bedside Swallow Evaluation Diet Prior to this Study: NPO Respiratory Status: Nasal cannula (2 liters) History of Recent Intubation: No Behavior/Cognition: Alert;Cooperative Oral Care Completed by SLP: Yes Oral Cavity - Dentition: Edentulous;Adequate natural dentition (pt  did not follow directions today - could not fully view oral cavity due to pt getting angry and yelling at SLP) Self-Feeding Abilities: Total assist;Other (Comment) (can hold his cup but is very impulsive taking very large sequenial swallows) Patient Positioning: Upright in bed Baseline Vocal Quality: Normal Volitional Cough: Strong Volitional Swallow: Unable to elicit    Oral/Motor/Sensory Function Overall Oral Motor/Sensory Function: Other (comment) (appears with minimal facial asymmetry on right)   Ice Chips Ice chips: Impaired Pharyngeal Phase Impairments: Suspected delayed Swallow   Thin Liquid Thin Liquid: Impaired Presentation: Self Fed;Straw;Spoon Pharyngeal  Phase Impairments: Cough - Immediate Other Comments: pt consumed liquids sequentially via straw without resting - resulting in dyspnea and cough - suspicious for aspiration due to likely decreased sustained laryngeal closure    Nectar Thick Nectar Thick Liquid: Impaired Presentation: Self Fed;Straw Pharyngeal Phase Impairments: Cough - Delayed   Honey Thick Honey Thick Liquid: Not tested   Puree Puree: Within functional limits Presentation: Spoon Other Comments: Pt only accepted one tsp before wanting "water"   Solid     Solid: Not tested Other Comments: DNT due to pt's dysarthria      Macario Golds 11/04/2021,12:08 PM Kathleen Lime, MS [facility] SLP [place] Office [phone] Pager [phone]

## 2021-11-05 DIAGNOSIS — A419 Sepsis, unspecified organism: Secondary | ICD-10-CM | POA: Diagnosis not present

## 2021-11-05 DIAGNOSIS — R6521 Severe sepsis with septic shock: Secondary | ICD-10-CM | POA: Diagnosis not present

## 2021-11-05 DIAGNOSIS — N390 Urinary tract infection, site not specified: Secondary | ICD-10-CM | POA: Diagnosis not present

## 2021-11-05 LAB — BASIC METABOLIC PANEL
Anion gap: 9 (ref 5–15)
BUN: 75 mg/dL — ABNORMAL HIGH (ref 8–23)
CO2: 19 mmol/L — ABNORMAL LOW (ref 22–32)
Calcium: 8 mg/dL — ABNORMAL LOW (ref 8.9–10.3)
Chloride: 114 mmol/L — ABNORMAL HIGH (ref 98–111)
Creatinine, Ser: 3.79 mg/dL — ABNORMAL HIGH (ref 0.61–1.24)
GFR, Estimated: 15 mL/min — ABNORMAL LOW (ref >60)
Glucose, Bld: 83 mg/dL (ref 70–99)
Potassium: 2.7 mmol/L — CL (ref 3.5–5.1)
Sodium: 142 mmol/L (ref 135–145)

## 2021-11-05 LAB — URINE CULTURE

## 2021-11-05 LAB — CBC
HCT: 21.9 % — ABNORMAL LOW (ref 39.0–52.0)
HCT: 25.5 % — ABNORMAL LOW (ref 39.0–52.0)
Hemoglobin: 6.8 g/dL — CL (ref 13.0–17.0)
Hemoglobin: 8.1 g/dL — ABNORMAL LOW (ref 13.0–17.0)
MCH: 31.9 pg (ref 26.0–34.0)
MCH: 31.4 pg (ref 26.0–34.0)
MCHC: 31.1 g/dL (ref 30.0–36.0)
MCHC: 31.8 g/dL (ref 30.0–36.0)
MCV: 102.8 fL — ABNORMAL HIGH (ref 80.0–100.0)
MCV: 98.8 fL (ref 80.0–100.0)
Platelets: 254 10*3/uL (ref 150–400)
Platelets: 236 10*3/uL (ref 150–400)
RBC: 2.13 MIL/uL — ABNORMAL LOW (ref 4.22–5.81)
RBC: 2.58 MIL/uL — ABNORMAL LOW (ref 4.22–5.81)
RDW: 19.8 % — ABNORMAL HIGH (ref 11.5–15.5)
RDW: 21.2 % — ABNORMAL HIGH (ref 11.5–15.5)
WBC: 9.3 10*3/uL (ref 4.0–10.5)
WBC: 9.1 10*3/uL (ref 4.0–10.5)
nRBC: 0 % (ref 0.0–0.2)
nRBC: 0 % (ref 0.0–0.2)

## 2021-11-05 LAB — GLUCOSE, CAPILLARY
Glucose-Capillary: 73 mg/dL (ref 70–99)
Glucose-Capillary: 71 mg/dL (ref 70–99)
Glucose-Capillary: 78 mg/dL (ref 70–99)
Glucose-Capillary: 108 mg/dL — ABNORMAL HIGH (ref 70–99)
Glucose-Capillary: 117 mg/dL — ABNORMAL HIGH (ref 70–99)
Glucose-Capillary: 138 mg/dL — ABNORMAL HIGH (ref 70–99)

## 2021-11-05 LAB — VANCOMYCIN, RANDOM: Vancomycin Rm: 12

## 2021-11-05 LAB — PREPARE RBC (CROSSMATCH)

## 2021-11-05 MED ORDER — CEFEPIME HCL 2 G IJ SOLR
2.0000 g | INTRAMUSCULAR | Status: DC
Start: 2021-11-05 — End: 2021-11-09
  Administered 2021-11-05 – 2021-11-08 (×4): 2 g via INTRAVENOUS
  Filled 2021-11-05 (×5): qty 2

## 2021-11-05 MED ORDER — POTASSIUM CHLORIDE 10 MEQ/100ML IV SOLN
10.0000 meq | INTRAVENOUS | Status: AC
  Administered 2021-11-05 (×4): 10 meq via INTRAVENOUS
  Filled 2021-11-05 (×4): qty 100

## 2021-11-05 MED ORDER — SODIUM CHLORIDE 0.9% IV SOLUTION: Freq: Once | INTRAVENOUS | Status: AC

## 2021-11-05 MED ORDER — DEXTROSE IN LACTATED RINGERS 5 % IV SOLN
INTRAVENOUS | Status: DC
Start: 2021-11-05 — End: 2021-11-06

## 2021-11-05 MED ORDER — LACTATED RINGERS IV BOLUS
1000.0000 mL | Freq: Once | INTRAVENOUS | Status: AC
  Administered 2021-11-05: 09:00:00 1000 mL via INTRAVENOUS

## 2021-11-05 NOTE — Progress Notes (Signed)
NAME:  Anthony Dudley, MRN:  [id number], DOB:  October 02, 1933, LOS: 2 ADMISSION DATE:  11/03/2021, CONSULTATION DATE:  11/03/2021 REFERRING MD:  Lacretia Leigh, MD CHIEF COMPLAINT:  fever   History of Present Illness:  [name] is an 87 year old gentleman with PMH significant for CKD, hypertension, CHF, dementia and prostate cancer. He was hospitalized at [facility] hospital in 10/22 because of AKI superimposed on CKD secondary to bilateral hydronephrosis and urethral erosion resulting in Bilateral CYSTOSCOPY WITH RETROGRADE PYELOGRAM,  AND BILATERAL STENTS with Raynelle Bring, MD. The patient had a scheduled pre op visit with urology office today and was found to be pyrexial and tachycardiac. He was diagnosed with urinary retention so a foley cath was placed which evacuated > 800 ml dark murky urine.  Hence he was treated with IM Rocephin and sent to [facility] ED for further evaluation.  At [facility], the patient was found to be septic with pyuria. He was treated with sepsis protocol IVF bolus but needed Levophed for refractory hypotension. Urine and blood cultures were sent off and he was treated with Flagyl and Vancomycin.  He was referred for ICU admission because of Levophed requirement.  I have seen and examined the patient. He was unable to contribute to this consultation because of impaired mental status. The patient's brother was concerned about possible COVID 19 infection since it has been prevalent at the [facility].  However patient's PCR SARS was negative.  Pertinent  Medical History  Metastatic prostate cancer with hx of hydronephrosis that required ureteral stents.  Significant Hospital Events: Including procedures, antibiotic start and stop dates in addition to other pertinent events   Cefepime 11/04/21. Received Flagyl and Vancomycin 11/03/21 Vanco stopped 12/23 L LE doppler 12/22 > no DVT  Interim History / Subjective:  Received 1 unit PRBC overnight for hemoglobin 6.8 Potassium replacement.   Serum creatinine continues to improve Norepinephrine 3 I/O+ 4.8 L total  Objective   Blood pressure (!) 127/47, pulse 75, temperature (!) 97.5 F (36.4 C), temperature source Axillary, resp. rate (!) 24, height 5\' 9"  (1.753 m), weight 76.1 kg, SpO2 97 %.        Intake/Output Summary (Last 24 hours) at 11/05/2021 0757 Last data filed at 11/05/2021 0600 Gross per 24 hour  Intake 4746.15 ml  Output 2625 ml  Net 2121.15 ml   Filed Weights   11/03/21 1745 11/04/21 0500 11/05/21 0356  Weight: 76.7 kg 74 kg 76.1 kg    Examination: General: elderly man, chronically ill, sleepy HENT: Dry OP, no stridor Lungs: clear B  Cardiovascular: regular, no M Abdomen: non-distended, + BS Extremities: LLE edema Neuro: Blind, sleepy but wakes to voice, some dysarthria ? Due to edentulous. Moves extremities.  GU: foley, clear yellow urine   Resolved Hospital Problem list   N/A  Assessment & Plan:   Acute metabolic encephalopathy due to septic shock, azotemia, superimposed on baseline dementia P -Some waxing and waning mental status but overall improved with treatment of underlying contributors particularly sepsis, acute renal failure -Delirium precautions -Careful with sedating medications  Septic shock due to acute cystitis, proctitis P -IVF -stabilizing on vanco + cefepime. Will d/c vanco 12/23, follow clinically and cx data. Hopefully narrow further on 12/24 -wean NE for MAP 65  Obstructive AKI on CKD III -query component of pre-renal 2/2 dehydration as well   P -continue foley (changed) -follow BMP, UOP   Metastatic prostate cancer with Ureteral obstruction and erosion Hydronephrosis s/p bilat stents 08/18/21 P -planning follow  up with urology as an outpatient, suprapubic catheter placement  LLE edema P -following, no evidence DVT on [place]  Chronic anemia P -follow CBC -goal hgb >7  Hx HTN -restart home BP regimen as he stabilizes, currently on hold   Best Practice  (right click and "Reselect all SmartList Selections" daily)   Diet/type: NPO DVT prophylaxis: prophylactic heparin  GI prophylaxis: N/A Lines: N/A Foley:  Yes, and it is still needed Code Status:  full code Last date of multidisciplinary goals of care discussion [11/03/2021 -- admitting provider d/w pt brother; further discussions w Red Christians on 12/22]   Labs   CBC: Recent Labs  Lab 11/03/21 1631 11/03/21 2229 11/04/21 0235 11/05/21 0236  WBC 12.0* 11.7* 11.4* 9.3  NEUTROABS 10.1*  --   --   --   HGB 8.3* 7.2* 7.0* 6.8*  HCT 26.9* 23.0* 22.7* 21.9*  MCV 105.5* 102.7* 106.1* 102.8*  PLT 290 242 257 384    Basic Metabolic Panel: Recent Labs  Lab 11/03/21 1631 11/03/21 2229 11/04/21 0235 11/05/21 0236  NA 145 143 141 142  K 4.8 4.3 3.7 2.7*  CL 114* 114* 113* 114*  CO2 22 18* 19* 19*  GLUCOSE 118* 135* 117* 83  BUN 115* 111* 102* 75*  CREATININE 6.08* 5.40*   5.25* 4.83* 3.79*  CALCIUM 8.3* 8.2* 7.8* 8.0*  MG  --   --  2.9*  --   PHOS  --   --  3.0  --    GFR: Estimated Creatinine Clearance: 14 mL/min (A) (by C-G formula based on SCr of 3.79 mg/dL (H)). Recent Labs  Lab 11/03/21 1630 11/03/21 1631 11/03/21 2229 11/04/21 0235 11/05/21 0236  WBC  --  12.0* 11.7* 11.4* 9.3  LATICACIDVEN 1.5  --  1.1  --   --     Liver Function Tests: Recent Labs  Lab 11/03/21 1631  AST 13*  ALT 12  ALKPHOS 418*  BILITOT 0.9  PROT 7.3  ALBUMIN 2.8*   No results for input(s): LIPASE, AMYLASE in the last 168 hours. No results for input(s): AMMONIA in the last 168 hours.  ABG No results found for: PHART, PCO2ART, PO2ART, HCO3, TCO2, ACIDBASEDEF, O2SAT   Coagulation Profile: Recent Labs  Lab 11/03/21 1631  INR 1.7*    Cardiac Enzymes: No results for input(s): CKTOTAL, CKMB, CKMBINDEX, TROPONINI in the last 168 hours.  HbA1C: Hgb A1c MFr Bld  Date/Time Value Ref Range Status  09/07/2021 04:06 AM 6.1 (H) 4.8 - 5.6 % Final    Comment:    (NOTE) Pre diabetes:           5.7%-6.4%  Diabetes:              >6.4%  Glycemic control for   <7.0% adults with diabetes     CBG: Recent Labs  Lab 11/04/21 1615 11/04/21 1922 11/04/21 2328 11/05/21 0353 11/05/21 0737  GLUCAP 87 81 74 73 71   CRITICAL CARE Performed by: Collene Gobble  Independent critical care time 32 minutes  Baltazar Apo, MD, PhD 11/05/2021, 8:00 AM [place] [facility] [phone] or if no answer before 7:00PM call [phone] For any issues after 7:00PM please call eLink [phone]

## 2021-11-05 NOTE — Progress Notes (Signed)
Speech Language Pathology Treatment: Dysphagia  Patient Details Name: Anthony Dudley MRN: [id number] DOB: 1933/01/19 Today's Date: 11/05/2021 Time: 5146-0479 SLP Time Calculation (min) (ACUTE ONLY): 23 min  Assessment / Plan / Recommendation Clinical Impression  Pt provided with nectar thick juice, single bites of mac n cheese, meat and fruit - pureed.  Due to his dementia, he demonstrates delayed swallow initiation and will frequently verbalize before having swallowed initial bolus in his mouth.  He states "Well that's not hardly anything".  SLP assured him that I was not taking away his drink but wanted him to consume small amounts to decrease aspiration risk.  Delays noted up to 30 seconds with purees - despite verbal cues to swallow. Intake is very poor - and pt mostly interested in drinking liquids. NO s/s of aspiration today apparent - he will continue to require full assist with strict precautions. Helping pt to eat is laborious due to his delay in swallow and this raises concerns for malnutrition, dehydration and aspiration risk.  Will follow for dysphagia management with goal to advance to thin liquids during hospital coarse as pt progresses.  Spoke to NT and shared information re: findings/compensations that are helpful.   HPI HPI: 87 year old gentleman with PMH significant for CKD, hypertension, GERD, CHF, dementia and prostate cancer. He was hospitalized at [facility] hospital in 10/22, surgery with urology 12/15 - now w/ Urinary retention, CT imaging, ascites, left lower lobe nodule/ ? Infection vs inflammatory 11/04/2021.  Pt possibly aspirated with dyspnea and speech/swallow eval ordered.  During October hospital admission, pt had a short term feeding tube placed. He remains full code - and was placed on dys1/nectar diet. Follow up for dysphagia management indicated.      SLP Plan  Continue with current plan of care      Recommendations for follow up therapy are one component of a  multi-disciplinary discharge planning process, led by the attending physician.  Recommendations may be updated based on patient status, additional functional criteria and insurance authorization.    Recommendations  Diet recommendations: Nectar-thick liquid;Dysphagia 1 (puree) Liquids provided via: Straw;Cup Medication Administration: Whole meds with puree Compensations: Slow rate;Small sips/bites Postural Changes and/or Swallow Maneuvers: Seated upright 90 degrees;Upright 30-60 min after meal                Oral Care Recommendations: Oral care BID Follow Up Recommendations: Follow physician's recommendations for discharge plan and follow up therapies SLP Visit Diagnosis: Dysphagia, oral phase (R13.11) Plan: Continue with current plan of care        Kathleen Lime, MS [place] [phone] Pager [phone]    Macario Golds  11/05/2021, 4:10 PM

## 2021-11-05 NOTE — Progress Notes (Signed)
[facility] Progress Note Patient Name: Anthony Dudley DOB: [birth year] MRN: [id number]   Date of Service  11/05/2021  HPI/Events of Note  K+ 2.7 crt 3.79 improving Pt has PIV. Nurse does not trust PO  eICU Interventions  Ordered K total 40 meqs IV, further correction as per bedside rounding team     Intervention Category Intermediate Interventions: Electrolyte abnormality - evaluation and management  Judd Lien 11/05/2021, 4:36 AM

## 2021-11-05 NOTE — Progress Notes (Signed)
Pharmacy Antibiotic Note  Anthony Dudley is a 87 y.o. male admitted on 11/03/2021 with sepsis.  Pharmacy has been consulted for Vancomycin dosing.  SCr on admit 6.08 > 4.8 > 3.8, Cl ~ 14 CG WBC sl elevated > wnl, Cont Afeb  Plan: Cefepime 2gm 12/21 > 1gm q24 > adj to 2gm q24 Vancomycin discontinued Vanc random level in am 12/23 = 12  Height: 5\' 9"  (175.3 cm) Weight: 76.1 kg (167 lb 12.3 oz) IBW/kg (Calculated) : 70.7  Temp (24hrs), Avg:97.8 F (36.6 C), Min:97.5 F (36.4 C), Max:98.3 F (36.8 C)  Recent Labs  Lab 11/03/21 1630 11/03/21 1631 11/03/21 2229 11/04/21 0235 11/05/21 0236  WBC  --  12.0* 11.7* 11.4* 9.3  CREATININE  --  6.08* 5.40*   5.25* 4.83* 3.79*  LATICACIDVEN 1.5  --  1.1  --   --   VANCORANDOM  --   --   --   --  12     Estimated Creatinine Clearance: 14 mL/min (A) (by C-G formula based on SCr of 3.79 mg/dL (H)).    Allergies  Allergen Reactions   Solanum Lycopersicum [Tomato]     Patient reports he is not allergic to tomato and he eats them all the time   Antimicrobials this admission: 2/21 Vancomycin & Flagyl x 1  12/21 Cefepime >>   Dose adjustments this admission: 12/23 Cefepime 1gm q24 > 2gm q24  Microbiology results: 12/21 BCx: ngtd 12/21 UCx: mult sp- final 12/21 MRSA PCR: pos  Thank you for allowing pharmacy to be a part of this patients care.  Minda Ditto PharmD 11/05/2021 10:22 AM

## 2021-11-05 NOTE — Progress Notes (Signed)
eLink Physician-Brief Progress Note Patient Name: Anthony Dudley DOB: [birth year] MRN: [id number]   Date of Service  11/05/2021  HPI/Events of Note  Notified of Hgb 6.8 No report of active bleed Remains on norepinephrine  eICU Interventions  Ordered 1 unit PRBC     Intervention Category Intermediate Interventions: Other:  Judd Lien 11/05/2021, 4:03 AM

## 2021-11-06 DIAGNOSIS — R6521 Severe sepsis with septic shock: Secondary | ICD-10-CM | POA: Diagnosis not present

## 2021-11-06 DIAGNOSIS — A419 Sepsis, unspecified organism: Secondary | ICD-10-CM | POA: Diagnosis not present

## 2021-11-06 DIAGNOSIS — N390 Urinary tract infection, site not specified: Secondary | ICD-10-CM | POA: Diagnosis not present

## 2021-11-06 LAB — GLUCOSE, CAPILLARY
Glucose-Capillary: 72 mg/dL (ref 70–99)
Glucose-Capillary: 84 mg/dL (ref 70–99)
Glucose-Capillary: 109 mg/dL — ABNORMAL HIGH (ref 70–99)
Glucose-Capillary: 106 mg/dL — ABNORMAL HIGH (ref 70–99)
Glucose-Capillary: 85 mg/dL (ref 70–99)

## 2021-11-06 LAB — BASIC METABOLIC PANEL
Anion gap: 7 (ref 5–15)
BUN: 50 mg/dL — ABNORMAL HIGH (ref 8–23)
CO2: 21 mmol/L — ABNORMAL LOW (ref 22–32)
Calcium: 7.8 mg/dL — ABNORMAL LOW (ref 8.9–10.3)
Chloride: 116 mmol/L — ABNORMAL HIGH (ref 98–111)
Creatinine, Ser: 2.88 mg/dL — ABNORMAL HIGH (ref 0.61–1.24)
GFR, Estimated: 21 mL/min — ABNORMAL LOW (ref >60)
Glucose, Bld: 88 mg/dL (ref 70–99)
Potassium: 2.5 mmol/L — CL (ref 3.5–5.1)
Sodium: 144 mmol/L (ref 135–145)

## 2021-11-06 MED ORDER — LACTATED RINGERS IV BOLUS
500.0000 mL | Freq: Once | INTRAVENOUS | Status: AC
  Administered 2021-11-06: 500 mL via INTRAVENOUS

## 2021-11-06 MED ORDER — POTASSIUM CHLORIDE 20 MEQ PO PACK
40.0000 meq | PACK | ORAL | Status: DC
  Filled 2021-11-06: qty 2

## 2021-11-06 MED ORDER — KCL IN DEXTROSE-NACL 30-5-0.45 MEQ/L-%-% IV SOLN
INTRAVENOUS | Status: DC
  Filled 2021-11-06 (×2): qty 1000

## 2021-11-06 MED ORDER — POTASSIUM CHLORIDE 10 MEQ/100ML IV SOLN
10.0000 meq | INTRAVENOUS | Status: AC
  Administered 2021-11-06 (×6): 10 meq via INTRAVENOUS
  Filled 2021-11-06 (×6): qty 100

## 2021-11-06 NOTE — Progress Notes (Signed)
NAME:  Anthony Dudley, MRN:  [id number], DOB:  November 25, 1932, LOS: 3 ADMISSION DATE:  11/03/2021, CONSULTATION DATE:  11/03/2021 REFERRING MD:  Lacretia Leigh, MD CHIEF COMPLAINT:  fever   History of Present Illness:  [name] is an 87 year old gentleman with PMH significant for CKD, hypertension, CHF, dementia and prostate cancer. He was hospitalized at [facility] hospital in 10/22 because of AKI superimposed on CKD secondary to bilateral hydronephrosis and urethral erosion resulting in Bilateral CYSTOSCOPY WITH RETROGRADE PYELOGRAM,  AND BILATERAL STENTS with Raynelle Bring, MD. The patient had a scheduled pre op visit with urology office today and was found to be pyrexial and tachycardiac. He was diagnosed with urinary retention so a foley cath was placed which evacuated > 800 ml dark murky urine.  Hence he was treated with IM Rocephin and sent to [facility] ED for further evaluation.  At [facility], the patient was found to be septic with pyuria. He was treated with sepsis protocol IVF bolus but needed Levophed for refractory hypotension. Urine and blood cultures were sent off and he was treated with Flagyl and Vancomycin.  He was referred for ICU admission because of Levophed requirement.  I have seen and examined the patient. He was unable to contribute to this consultation because of impaired mental status. The patient's brother was concerned about possible COVID 19 infection since it has been prevalent at the [facility].  However patient's PCR SARS was negative.  Pertinent  Medical History  Metastatic prostate cancer with hx of hydronephrosis that required ureteral stents.  Significant Hospital Events: Including procedures, antibiotic start and stop dates in addition to other pertinent events   Cefepime 11/04/21. Received Flagyl and Vancomycin 11/03/21 Vanco stopped 12/23 L LE doppler 12/22 > no DVT Urine culture 12/21 >> multiple species, none predominant  Interim History / Subjective:  Was off  norepinephrine through the day 12/23 but restarted overnight, currently on 2 On room air No labs this morning I/O+ 4.7 L total   Objective   Blood pressure (!) 120/56, pulse 92, temperature 97.6 F (36.4 C), temperature source Axillary, resp. rate (!) 24, height 5\' 9"  (1.753 m), weight 77.7 kg, SpO2 95 %.        Intake/Output Summary (Last 24 hours) at 11/06/2021 0753 Last data filed at 11/06/2021 0400 Gross per 24 hour  Intake 2032.05 ml  Output 2125 ml  Net -92.95 ml   Filed Weights   11/04/21 0500 11/05/21 0356 11/06/21 0500  Weight: 74 kg 76.1 kg 77.7 kg    Examination: General: Elderly male laying in bed, no distress HENT: Oropharynx somewhat dry.  Mild dysarthria Lungs: Clear bilaterally Cardiovascular: Regular, no murmur Abdomen: Soft, nondistended, positive bowel sounds Extremities: Left greater than right lower extremity edema Neuro: Blind.  Wakes easily and interacts.  Answers questions but sometimes garbled.  Oriented to self, place, question situation.  Moves extremities GU: Foley catheter in place  Resolved Hospital Problem list   N/A  Assessment & Plan:   Acute metabolic encephalopathy due to septic shock, azotemia, superimposed on baseline dementia P -Waxing and waning mental status, overall improved with treatment of his underlying contributors, sepsis, renal failure -Delirium precautions -Minimize sedating medications  Septic shock due to acute cystitis, proctitis.  No predominant organism on culture P -IVF -Remains on cefepime, vancomycin stopped 12/23.  If he comes off pressors today 12/24 then will plan to narrow further, likely to ceftriaxone on 12/25 if stable -Plan to wean norepinephrine for SBP 90 >> off this  am  Obstructive AKI on CKD III -query component of pre-renal 2/2 dehydration as well   P -Continue Foley in place.  Ultimately will require suprapubic as below.  Changed on date of admission -Follow BMP, urine output    Hypokalemia P -Need to replace aggressively 12/24  Metastatic prostate cancer with Ureteral obstruction and erosion Hydronephrosis s/p bilat stents 08/18/21 P -planning follow up with urology as an outpatient, suprapubic catheter placement  LLE edema -No evidence DVT on ultrasound P -Following  Chronic anemia P -Follow CBC -Transfusion goal hemoglobin 7  Hx HTN -restart home BP regimen as he stabilizes, continue to hold for now  Disposition: Should be able to transition out of the ICU on 12/24.  We will ask TRH to assume his care as of 12/25.   Best Practice (right click and "Reselect all SmartList Selections" daily)   Diet/type: NPO DVT prophylaxis: prophylactic heparin  GI prophylaxis: N/A Lines: N/A Foley:  Yes, and it is still needed Code Status:  full code Last date of multidisciplinary goals of care discussion [11/03/2021 -- admitting provider d/w pt brother; further discussions w G Bowser on 12/22]   Labs   CBC: Recent Labs  Lab 11/03/21 1631 11/03/21 2229 11/04/21 0235 11/05/21 0236 11/05/21 1507  WBC 12.0* 11.7* 11.4* 9.3 9.1  NEUTROABS 10.1*  --   --   --   --   HGB 8.3* 7.2* 7.0* 6.8* 8.1*  HCT 26.9* 23.0* 22.7* 21.9* 25.5*  MCV 105.5* 102.7* 106.1* 102.8* 98.8  PLT 290 242 257 254 962    Basic Metabolic Panel: Recent Labs  Lab 11/03/21 1631 11/03/21 2229 11/04/21 0235 11/05/21 0236  NA 145 143 141 142  K 4.8 4.3 3.7 2.7*  CL 114* 114* 113* 114*  CO2 22 18* 19* 19*  GLUCOSE 118* 135* 117* 83  BUN 115* 111* 102* 75*  CREATININE 6.08* 5.40*   5.25* 4.83* 3.79*  CALCIUM 8.3* 8.2* 7.8* 8.0*  MG  --   --  2.9*  --   PHOS  --   --  3.0  --    GFR: Estimated Creatinine Clearance: 14 mL/min (A) (by C-G formula based on SCr of 3.79 mg/dL (H)). Recent Labs  Lab 11/03/21 1630 11/03/21 1631 11/03/21 2229 11/04/21 0235 11/05/21 0236 11/05/21 1507  WBC  --    < > 11.7* 11.4* 9.3 9.1  LATICACIDVEN 1.5  --  1.1  --   --   --    < > =  values in this interval not displayed.    Liver Function Tests: Recent Labs  Lab 11/03/21 1631  AST 13*  ALT 12  ALKPHOS 418*  BILITOT 0.9  PROT 7.3  ALBUMIN 2.8*   No results for input(s): LIPASE, AMYLASE in the last 168 hours. No results for input(s): AMMONIA in the last 168 hours.  ABG No results found for: PHART, PCO2ART, PO2ART, HCO3, TCO2, ACIDBASEDEF, O2SAT   Coagulation Profile: Recent Labs  Lab 11/03/21 1631  INR 1.7*    Cardiac Enzymes: No results for input(s): CKTOTAL, CKMB, CKMBINDEX, TROPONINI in the last 168 hours.  HbA1C: Hgb A1c MFr Bld  Date/Time Value Ref Range Status  09/07/2021 04:06 AM 6.1 (H) 4.8 - 5.6 % Final    Comment:    (NOTE) Pre diabetes:          5.7%-6.4%  Diabetes:              >6.4%  Glycemic control for   <7.0% adults with  diabetes     CBG: Recent Labs  Lab 11/05/21 1605 11/05/21 1939 11/05/21 2315 11/06/21 0345 11/06/21 0735  GLUCAP 108* 117* 138* 72 84   CRITICAL CARE Performed by: Collene Gobble  Independent critical care time 31 minutes  Baltazar Apo, MD, PhD 11/06/2021, 7:53 AM [facility] [phone] or if no answer before 7:00PM call [phone] For any issues after 7:00PM please call eLink [phone]

## 2021-11-06 NOTE — Progress Notes (Signed)
Transition of Care (TOC) Screening Note  Patient Details  Name: Anthony Dudley Date of Birth: 1933/06/19  Transition of Care [facility]) CM/SW Contact:    Sherie Don, LCSW Phone Number: 11/06/2021, 12:56 PM  Transition of Care Department [facility]) has reviewed patient and no TOC needs have been identified at this time. We will continue to monitor patient advancement through interdisciplinary progression rounds. If new patient transition needs arise, please place a TOC consult.

## 2021-11-06 NOTE — Progress Notes (Addendum)
eLink Physician-Brief Progress Note Patient Name: Anthony Dudley DOB: [birth year] MRN: [id number]   Date of Service  11/06/2021  HPI/Events of Note  Hypotension with SBP in 80s. No symptoms, Was on levophed earlier. Making large amounts of urine  eICU Interventions  LR bolus x 1      Intervention Category Major Interventions: Hypotension - evaluation and management  Oveda Dadamo G Savon Bordonaro 11/06/2021, 11:12 PM  Addendum at 420 am Notified of K = 3, better after repletion yesterday and is on fluids with Kcl in it too Will give additional 30 meq IV Kcl  Recheck K at 9 am

## 2021-11-07 DIAGNOSIS — A419 Sepsis, unspecified organism: Secondary | ICD-10-CM | POA: Diagnosis not present

## 2021-11-07 DIAGNOSIS — E876 Hypokalemia: Secondary | ICD-10-CM | POA: Diagnosis present

## 2021-11-07 DIAGNOSIS — C61 Malignant neoplasm of prostate: Secondary | ICD-10-CM

## 2021-11-07 DIAGNOSIS — C7989 Secondary malignant neoplasm of other specified sites: Secondary | ICD-10-CM

## 2021-11-07 DIAGNOSIS — R339 Retention of urine, unspecified: Secondary | ICD-10-CM | POA: Diagnosis present

## 2021-11-07 DIAGNOSIS — G9341 Metabolic encephalopathy: Secondary | ICD-10-CM | POA: Diagnosis present

## 2021-11-07 DIAGNOSIS — D649 Anemia, unspecified: Secondary | ICD-10-CM

## 2021-11-07 DIAGNOSIS — R600 Localized edema: Secondary | ICD-10-CM | POA: Diagnosis present

## 2021-11-07 DIAGNOSIS — R609 Edema, unspecified: Secondary | ICD-10-CM

## 2021-11-07 DIAGNOSIS — R6521 Severe sepsis with septic shock: Secondary | ICD-10-CM | POA: Diagnosis not present

## 2021-11-07 DIAGNOSIS — I10 Essential (primary) hypertension: Secondary | ICD-10-CM

## 2021-11-07 DIAGNOSIS — H548 Legal blindness, as defined in USA: Secondary | ICD-10-CM

## 2021-11-07 DIAGNOSIS — N390 Urinary tract infection, site not specified: Secondary | ICD-10-CM | POA: Diagnosis not present

## 2021-11-07 LAB — GLUCOSE, CAPILLARY
Glucose-Capillary: 77 mg/dL (ref 70–99)
Glucose-Capillary: 82 mg/dL (ref 70–99)
Glucose-Capillary: 83 mg/dL (ref 70–99)
Glucose-Capillary: 87 mg/dL (ref 70–99)
Glucose-Capillary: 89 mg/dL (ref 70–99)

## 2021-11-07 LAB — BASIC METABOLIC PANEL
Anion gap: 5 (ref 5–15)
BUN: 41 mg/dL — ABNORMAL HIGH (ref 8–23)
CO2: 20 mmol/L — ABNORMAL LOW (ref 22–32)
Calcium: 7.7 mg/dL — ABNORMAL LOW (ref 8.9–10.3)
Chloride: 120 mmol/L — ABNORMAL HIGH (ref 98–111)
Creatinine, Ser: 2.59 mg/dL — ABNORMAL HIGH (ref 0.61–1.24)
GFR, Estimated: 23 mL/min — ABNORMAL LOW (ref >60)
Glucose, Bld: 95 mg/dL (ref 70–99)
Potassium: 3 mmol/L — ABNORMAL LOW (ref 3.5–5.1)
Sodium: 145 mmol/L (ref 135–145)

## 2021-11-07 LAB — BPAM RBC
Blood Product Expiration Date: 202301182359
ISSUE DATE / TIME: 202212231000
Unit Type and Rh: 5100

## 2021-11-07 LAB — TYPE AND SCREEN
ABO/RH(D): O POS
Antibody Screen: NEGATIVE
Unit division: 0

## 2021-11-07 LAB — CBC
HCT: 24 % — ABNORMAL LOW (ref 39.0–52.0)
Hemoglobin: 7.6 g/dL — ABNORMAL LOW (ref 13.0–17.0)
MCH: 31.7 pg (ref 26.0–34.0)
MCHC: 31.7 g/dL (ref 30.0–36.0)
MCV: 100 fL (ref 80.0–100.0)
Platelets: 220 10*3/uL (ref 150–400)
RBC: 2.4 MIL/uL — ABNORMAL LOW (ref 4.22–5.81)
RDW: 21.4 % — ABNORMAL HIGH (ref 11.5–15.5)
WBC: 7.9 10*3/uL (ref 4.0–10.5)
nRBC: 0 % (ref 0.0–0.2)

## 2021-11-07 LAB — POTASSIUM: Potassium: 3.8 mmol/L (ref 3.5–5.1)

## 2021-11-07 LAB — MAGNESIUM: Magnesium: 2.2 mg/dL (ref 1.7–2.4)

## 2021-11-07 MED ORDER — POTASSIUM CHLORIDE 10 MEQ/100ML IV SOLN
10.0000 meq | INTRAVENOUS | Status: AC
  Administered 2021-11-07 (×3): 10 meq via INTRAVENOUS
  Filled 2021-11-07 (×3): qty 100

## 2021-11-07 NOTE — Progress Notes (Signed)
PROGRESS NOTE    Anthony Dudley  ERD:[id number] DOB: [birth year] DOA: 11/03/2021 PCP: [name], MD  Chief Complaint  Patient presents with   Hypotension   Fever    Brief Narrative:  87 yo with CKD, HTN, HF, dementia, prostate cancer who was hospitalized on October with sepsis and AKI on CKD 2/2 obstruction related to metastatic prostate cancer.  He's being treated at this hospitalization for septic shock 2/2 UTI.  He was initially managed in the ICU with need for pressors.  He's been transferred to the hospitalist service on 12/25.  See below for additional details    Assessment & Plan:   Principal Problem:   Septic shock due to urinary tract infection ([place]) Active Problems:   Acute metabolic encephalopathy   Acute kidney injury superimposed on CKD [facility])   Prostate cancer metastatic to multiple sites [facility])   Acute urinary retention   Lower extremity edema   Anemia   Hypokalemia   Essential hypertension   Legally blind   * Septic shock due to urinary tract infection ([place])- (present on admission) Presented to preop visit with urology, found to be febrile and tachycardic with urinary retention with >800 cc dark murky urine Given IM ceftriaxone and sent to ED Initially admitted to ICU with need for pressors -> weaned off  Vanc/cefepime/flagyl x1 on 12/21 Cefepime 12/21-present, plan for at least 7 days Urine cx with multiple species.  Per previous notes, prior cx with MDR proteus sensitive to cefepime (09/2019) Blood cx NG CT abd pelvis with mild bilateral hydro (decreased from prior) - bilateral ureteral stents  Acute metabolic encephalopathy 2/2 acute infection Delirium precautions Unclear what his recent baseline is, will discuss with brother  Acute kidney injury superimposed on CKD ([place]) Baseline creatinine 2.2 Peaked at 6.08 here, improving, follow 2/2 sepsis S/p bilateral antegrade ureter stents on 10/18 by IR 2/2 obstructive uropathy (initially  bilateral perc nephrostomy tubes placed 08/19/21)  Acute urinary retention plan for suprapubic catheter outpatient with urology  Prostate cancer metastatic to multiple sites [facility]) PSA 2,268 10/22 Follows with urology, extensive bone mets     Lower extremity edema LE [place] negative for DVT on L  Hypokalemia Replace and follow  Anemia Trend S/p 1 unit prbc  Essential hypertension BP meds on hold, BP reasobale today (doesn't appear he's on many outpatient)  Legally blind Brother ordering glases   DVT prophylaxis: hearin Code Status: full Family Communication: ben, brother Disposition:   Status is: Inpatient  Remains inpatient appropriate because: need for iv abx       Consultants:  pccm  Procedures:  none  Antimicrobials:  Anti-infectives (From admission, onward)    Start     Dose/Rate Route Frequency Ordered Stop   11/05/21 1700  ceFEPIme (MAXIPIME) 2 g in sodium chloride 0.9 % 100 mL IVPB        2 g 200 mL/hr over 30 Minutes Intravenous Every 24 hours 11/05/21 1026     11/04/21 1700  ceFEPIme (MAXIPIME) 1 g in sodium chloride 0.9 % 100 mL IVPB  Status:  Discontinued        1 g 200 mL/hr over 30 Minutes Intravenous Every 24 hours 11/03/21 2034 11/05/21 1026   11/04/21 1132  vancomycin variable dose per unstable renal function (pharmacist dosing)  Status:  Discontinued         Does not apply See admin instructions 11/04/21 1132 11/05/21 0954   11/03/21 1530  ceFEPIme (MAXIPIME) 2 g in sodium chloride 0.9 % 100  mL IVPB        2 g 200 mL/hr over 30 Minutes Intravenous  Once 11/03/21 1518 11/03/21 2000   11/03/21 1530  metroNIDAZOLE (FLAGYL) IVPB 500 mg        500 mg 100 mL/hr over 60 Minutes Intravenous  Once 11/03/21 1518 11/03/21 2000   11/03/21 1530  vancomycin (VANCOCIN) IVPB 1000 mg/200 mL premix        1,000 mg 200 mL/hr over 60 Minutes Intravenous  Once 11/03/21 1518 11/03/21 2000       Subjective: confused  Objective: Vitals:   11/07/21 1303  11/07/21 1400 11/07/21 1500 11/07/21 1602  BP: (!) 131/58 (!) 97/37 (!) 108/39 130/66  Pulse: 90 60 85 91  Resp: (!) 22 (!) 21 20 18   Temp:      TempSrc:      SpO2: 94% 93% (!) 89% 98%  Weight:      Height:        Intake/Output Summary (Last 24 hours) at 11/07/2021 1654 Last data filed at 11/07/2021 1302 Gross per 24 hour  Intake 994.68 ml  Output 2825 ml  Net -1830.32 ml   Filed Weights   11/05/21 0356 11/06/21 0500 11/07/21 0444  Weight: 76.1 kg 77.7 kg 78.7 kg    Examination:  General exam: Appears calm and comfortable  Respiratory system: unlabored Cardiovascular system:rrr Gastrointestinal system: Abdomen is nondistended, soft and nontender.  Central nervous system: pleasantly confused Extremities: no lee  Data Reviewed: I have personally reviewed following labs and imaging studies  CBC: Recent Labs  Lab 11/03/21 1631 11/03/21 2229 11/04/21 0235 11/05/21 0236 11/05/21 1507 11/07/21 0247  WBC 12.0* 11.7* 11.4* 9.3 9.1 7.9  NEUTROABS 10.1*  --   --   --   --   --   HGB 8.3* 7.2* 7.0* 6.8* 8.1* 7.6*  HCT 26.9* 23.0* 22.7* 21.9* 25.5* 24.0*  MCV 105.5* 102.7* 106.1* 102.8* 98.8 100.0  PLT 290 242 257 254 236 354    Basic Metabolic Panel: Recent Labs  Lab 11/03/21 2229 11/04/21 0235 11/05/21 0236 11/06/21 0812 11/07/21 0247 11/07/21 0923  NA 143 141 142 144 145  --   K 4.3 3.7 2.7* 2.5* 3.0* 3.8  CL 114* 113* 114* 116* 120*  --   CO2 18* 19* 19* 21* 20*  --   GLUCOSE 135* 117* 83 88 95  --   BUN 111* 102* 75* 50* 41*  --   CREATININE 5.40*   5.25* 4.83* 3.79* 2.88* 2.59*  --   CALCIUM 8.2* 7.8* 8.0* 7.8* 7.7*  --   MG  --  2.9*  --   --  2.2  --   PHOS  --  3.0  --   --   --   --     GFR: Estimated Creatinine Clearance: 20.5 mL/min (Dixie Jafri) (by C-G formula based on SCr of 2.59 mg/dL (H)).  Liver Function Tests: Recent Labs  Lab 11/03/21 1631  AST 13*  ALT 12  ALKPHOS 418*  BILITOT 0.9  PROT 7.3  ALBUMIN 2.8*    CBG: Recent Labs  Lab  11/07/21 0028 11/07/21 0340 11/07/21 0736 11/07/21 1113 11/07/21 1507  GLUCAP 89 87 83 82 77     Recent Results (from the past 240 hour(s))  Blood Culture (routine x 2)     Status: None (Preliminary result)   Collection Time: 11/03/21  3:55 PM   Specimen: BLOOD  Result Value Ref Range Status   Specimen Description   Final  BLOOD RIGHT ANTECUBITAL Performed at [place] [address], [place], [place] [address]    Special Requests   Final    BOTTLES DRAWN AEROBIC AND ANAEROBIC Blood Culture results may not be optimal due to an inadequate volume of blood received in culture bottles Performed at [place] [address] [place], [place] [address]    Culture   Final    NO GROWTH 4 DAYS Performed at [facility], [place] [address], [place], [place] [address]    Report Status PENDING  Incomplete  Blood Culture (routine x 2)     Status: None (Preliminary result)   Collection Time: 11/03/21  4:07 PM   Specimen: BLOOD  Result Value Ref Range Status   Specimen Description   Final    BLOOD RIGHT ANTECUBITAL Performed at [place] [address] [place], [place] [address]    Special Requests   Final    Blood Culture results may not be optimal due to an inadequate volume of blood received in culture bottles BOTTLES DRAWN AEROBIC AND ANAEROBIC Performed at [facility], [place] [address] [place], [place] [address]    Culture   Final    NO GROWTH 4 DAYS Performed at [facility], [place] [address] [place], [place] [address]    Report Status PENDING  Incomplete  Resp Panel by RT-PCR (Flu Donivin Wirt&B, Covid) Nasopharyngeal Swab     Status: None   Collection Time: 11/03/21  4:27 PM   Specimen: Nasopharyngeal Swab; Nasopharyngeal(NP) swabs in vial transport medium  Result Value Ref Range Status   SARS Coronavirus 2 by RT PCR NEGATIVE NEGATIVE Final    Comment: (NOTE) SARS-CoV-2 target nucleic  acids are NOT DETECTED.  The SARS-CoV-2 RNA is generally detectable in upper respiratory specimens during the acute phase of infection. The lowest concentration of SARS-CoV-2 viral copies this assay can detect is 138 copies/mL. Zorianna Taliaferro negative result does not preclude SARS-Cov-2 infection and should not be used as the sole basis for treatment or other patient management decisions. Bradie Lacock negative result may occur with  improper specimen collection/handling, submission of specimen other than nasopharyngeal swab, presence of viral mutation(s) within the areas targeted by this assay, and inadequate number of viral copies(<138 copies/mL). Kainalu Heggs negative result must be combined with clinical observations, patient history, and epidemiological information. The expected result is Negative.  Fact Sheet for Patients:  EntrepreneurPulse.com.au  Fact Sheet for Healthcare Providers:  IncredibleEmployment.be  This test is no t yet approved or cleared by the [place] FDA and  has been authorized for detection and/or diagnosis of SARS-CoV-2 by FDA under an Emergency Use Authorization (EUA). This EUA will remain  in effect (meaning this test can be used) for the duration of the COVID-19 declaration under Section 564(b)(1) of the Act, 21 U.S.C.section 360bbb-3(b)(1), unless the authorization is terminated  or revoked sooner.       Influenza Sidnee Gambrill by PCR NEGATIVE NEGATIVE Final   Influenza B by PCR NEGATIVE NEGATIVE Final    Comment: (NOTE) The Xpert Xpress SARS-CoV-2/FLU/RSV plus assay is intended as an aid in the diagnosis of influenza from Nasopharyngeal swab specimens and should not be used as Daray Polgar sole basis for treatment. Nasal washings and aspirates are unacceptable for Xpert Xpress SARS-CoV-2/FLU/RSV testing.  Fact Sheet for Patients: EntrepreneurPulse.com.au  Fact Sheet for Healthcare Providers: IncredibleEmployment.be  This  test is not yet approved or cleared by the [place] and has been authorized  for detection and/or diagnosis of SARS-CoV-2 by FDA under an Emergency Use Authorization (EUA). This EUA will remain in effect (meaning this test can be used) for the duration of the COVID-19 declaration under Section 564(b)(1) of the Act, 21 U.S.C. section 360bbb-3(b)(1), unless the authorization is terminated or revoked.  Performed at [facility], [place] [address], [place], [place] [address]   Urine Culture     Status: Abnormal   Collection Time: 11/03/21  4:28 PM   Specimen: In/Out Cath Urine  Result Value Ref Range Status   Specimen Description   Final    IN/OUT CATH URINE Performed at [place] [address], [place], [place] [address]    Special Requests   Final    NONE Performed at [facility], [place] [address], [place], [place] [address]    Culture MULTIPLE SPECIES PRESENT, SUGGEST RECOLLECTION (Avrom Robarts)  Final   Report Status 11/05/2021 FINAL  Final  MRSA Next Gen by PCR, Nasal     Status: Abnormal   Collection Time: 11/03/21 10:00 PM   Specimen: Nasal Mucosa; Nasal Swab  Result Value Ref Range Status   MRSA by PCR Next Gen DETECTED (Bellatrix Devonshire) NOT DETECTED Final    Comment: CRITICAL RESULT CALLED TO, READ BACK BY AND VERIFIED WITH:  COURTNEY GRIFFIN RN 11/03/21 @ 0940 VS (NOTE) The GeneXpert MRSA Assay (FDA approved for NASAL specimens only), is one component of Abrianna Sidman comprehensive MRSA colonization surveillance program. It is not intended to diagnose MRSA infection nor to guide or monitor treatment for MRSA infections. Test performance is not FDA approved in patients less than 83 years old. Performed at [facility], [place] [address], [place], [place] [address]          Radiology Studies: No results found.      Scheduled Meds:  chlorhexidine  15 mL Mouth Rinse BID   Chlorhexidine Gluconate Cloth  6 each  Topical Daily   heparin  5,000 Units Subcutaneous Q8H   mouth rinse  15 mL Mouth Rinse q12n4p   mupirocin ointment  1 application Nasal BID   Continuous Infusions:  sodium chloride Stopped (11/04/21 0043)   ceFEPime (MAXIPIME) IV Stopped (11/06/21 1743)   dexrose 5 % and 0.45 % NaCl with KCl 30 mEq/L 30 mL/hr at 11/07/21 0839     LOS: 4 days    Time spent: over 30 min    Fayrene Helper, MD [facility]   To contact the attending provider between 7A-7P or the covering provider during after hours 7P-7A, please log into the web site [email or web address] and access using universal [place] password for that web site. If you do not have the password, please call the hospital operator.  11/07/2021, 4:54 PM

## 2021-11-07 NOTE — Assessment & Plan Note (Deleted)
Brother ordering glases

## 2021-11-07 NOTE — Assessment & Plan Note (Addendum)
SLP eval, dysphagia 1 nectar thick Failed calorie count. POA brother would like to pursue PEG tube placement if he fails calorie count. IR has recommended surgical consult for PEG.

## 2021-11-07 NOTE — Assessment & Plan Note (Addendum)
Secondary to obstructive uropathy, sepsis with ATN as well as dehydration. Baseline creatinine 2.2, peaked at 6.08 here, improving Along with hyperchloremic metabolic acidosis, hyponatremia, hypokalemia, hypomagnesemia. Currently receiving D5 potassium for correction.

## 2021-11-07 NOTE — Assessment & Plan Note (Addendum)
At baseline hemoglobin around 8. On presentation hemoglobin was 7.2. SP 1 PRBC transfusion transfuse for hemoglobin less than 7.   Brother has provided consent.

## 2021-11-07 NOTE — Assessment & Plan Note (Addendum)
Met SIRS criteria on presentation febrile, tachycardic. Admitted to ICU requiring pressors.  Secondary to UTI. °Cefepime 12/21-12/26. °Transition to zosyn 12/27 with AMS, °Last day of antibiotic 1/1. °Urine cx with multiple species. °Per previous notes, prior cx with MDR proteus sensitive to cefepime °Blood cx NG °CT abd pelvis with mild bilateral hydro (decreased from prior) - bilateral ureteral stents °Underwent cystoscopy, stent exchange as well as suprapubic catheter placement. °Currently resolved. °

## 2021-11-07 NOTE — Assessment & Plan Note (Addendum)
PSA 2,268 10/22 Follows with urology, extensive bone mets. He has remained on ADT with this re-dosed in November and was recently approved to begin enzalutamide at his nursing facility. Outpatient management urology.

## 2021-11-07 NOTE — Assessment & Plan Note (Addendum)
Blood pressure stable without any medication.  Monitor.

## 2021-11-07 NOTE — Hospital Course (Addendum)
87 yo with CKD, HTN, HF, dementia, prostate cancer who was hospitalized on October with sepsis and AKI on CKD 2/2 obstruction related to metastatic prostate cancer.  He's being treated at this hospitalization for septic shock 2/2 UTI.  He was initially managed in the ICU with need for pressors.  He's been transferred to the hospitalist service on 12/25.  He's encephalopathic, currently covering for UTI and possible pneumonia.   12/29 underwent cystoscopy, bilateral ureteral stent exchange, placement of suprapubic tube.  EEG negative. 1/2 failed calorie count, recommendation is for feeding tube placement.  IR unable to place a feeding tube.  Will consult general surgery.

## 2021-11-07 NOTE — Assessment & Plan Note (Deleted)
Replaced and follow

## 2021-11-07 NOTE — Assessment & Plan Note (Signed)
LE [place] negative for DVT on L

## 2021-11-07 NOTE — Assessment & Plan Note (Addendum)
10/5 underwent cystoscopy and fulguration of bladder. 10/6 bilateral PCN 10/18 replacement of bilateral nephrostomy tube and stent placement 10/24 bilateral PCN removal. 12/29 underwent cystoscopy, bilateral ureteral stent exchange and placement of suprapubic tube.  chronic urinary retention for multiple years and has been catheter dependent. Also has bilateral ureteral obstruction managed with bilateral ureteral stents placed antegrade. Outpatient, he was noted to have urethral erosion and is scheduled SP tube placement along with exchange of his bilateral ureteral stents on Thursday. Presented to urology office for a routine catheter change and urine culture in preparation for his surgery from where he was sent to ER for septic shock

## 2021-11-07 NOTE — Assessment & Plan Note (Addendum)
Waxing and waning mentation Multifactorial, infection, delirium, deconditioning. Delirium precautions, avoid psychotropic medication Per family seems worse than his baseline  B12 wnl, VBG without hypercarbia, folate wnl, TSH mildly elevated, free T4 also mildly elevated. CT head unremarkable. EEG negative for any acute seizure Check cosyntropin stimulation test.

## 2021-11-08 ENCOUNTER — Inpatient Hospital Stay (HOSPITAL_COMMUNITY): Payer: Medicare Other

## 2021-11-08 DIAGNOSIS — I495 Sick sinus syndrome: Secondary | ICD-10-CM

## 2021-11-08 DIAGNOSIS — A419 Sepsis, unspecified organism: Secondary | ICD-10-CM | POA: Diagnosis not present

## 2021-11-08 DIAGNOSIS — E871 Hypo-osmolality and hyponatremia: Secondary | ICD-10-CM

## 2021-11-08 DIAGNOSIS — J189 Pneumonia, unspecified organism: Secondary | ICD-10-CM

## 2021-11-08 DIAGNOSIS — R1310 Dysphagia, unspecified: Secondary | ICD-10-CM

## 2021-11-08 DIAGNOSIS — R000 Tachycardia, unspecified: Secondary | ICD-10-CM | POA: Diagnosis present

## 2021-11-08 DIAGNOSIS — N390 Urinary tract infection, site not specified: Secondary | ICD-10-CM | POA: Diagnosis not present

## 2021-11-08 DIAGNOSIS — G9341 Metabolic encephalopathy: Secondary | ICD-10-CM | POA: Diagnosis not present

## 2021-11-08 DIAGNOSIS — R6521 Severe sepsis with septic shock: Secondary | ICD-10-CM | POA: Diagnosis not present

## 2021-11-08 LAB — CULTURE, BLOOD (ROUTINE X 2)
Culture: NO GROWTH
Culture: NO GROWTH

## 2021-11-08 LAB — COMPREHENSIVE METABOLIC PANEL
ALT: 14 U/L (ref 0–44)
AST: 20 U/L (ref 15–41)
Albumin: 2.2 g/dL — ABNORMAL LOW (ref 3.5–5.0)
Alkaline Phosphatase: 295 U/L — ABNORMAL HIGH (ref 38–126)
Anion gap: 9 (ref 5–15)
BUN: 33 mg/dL — ABNORMAL HIGH (ref 8–23)
CO2: 19 mmol/L — ABNORMAL LOW (ref 22–32)
Calcium: 8.3 mg/dL — ABNORMAL LOW (ref 8.9–10.3)
Chloride: 123 mmol/L — ABNORMAL HIGH (ref 98–111)
Creatinine, Ser: 2.46 mg/dL — ABNORMAL HIGH (ref 0.61–1.24)
GFR, Estimated: 25 mL/min — ABNORMAL LOW (ref >60)
Glucose, Bld: 100 mg/dL — ABNORMAL HIGH (ref 70–99)
Potassium: 3.5 mmol/L (ref 3.5–5.1)
Sodium: 151 mmol/L — ABNORMAL HIGH (ref 135–145)
Total Bilirubin: 0.7 mg/dL (ref 0.3–1.2)
Total Protein: 6.4 g/dL — ABNORMAL LOW (ref 6.5–8.1)

## 2021-11-08 LAB — CBC WITH DIFFERENTIAL/PLATELET
Abs Immature Granulocytes: 0.52 10*3/uL — ABNORMAL HIGH (ref 0.00–0.07)
Basophils Absolute: 0 10*3/uL (ref 0.0–0.1)
Basophils Relative: 0 %
Eosinophils Absolute: 0.4 10*3/uL (ref 0.0–0.5)
Eosinophils Relative: 4 %
HCT: 30 % — ABNORMAL LOW (ref 39.0–52.0)
Hemoglobin: 9.3 g/dL — ABNORMAL LOW (ref 13.0–17.0)
Immature Granulocytes: 6 %
Lymphocytes Relative: 31 %
Lymphs Abs: 2.9 10*3/uL (ref 0.7–4.0)
MCH: 31.5 pg (ref 26.0–34.0)
MCHC: 31 g/dL (ref 30.0–36.0)
MCV: 101.7 fL — ABNORMAL HIGH (ref 80.0–100.0)
Monocytes Absolute: 0.5 10*3/uL (ref 0.1–1.0)
Monocytes Relative: 5 %
Neutro Abs: 4.9 10*3/uL (ref 1.7–7.7)
Neutrophils Relative %: 54 %
Platelets: 254 10*3/uL (ref 150–400)
RBC: 2.95 MIL/uL — ABNORMAL LOW (ref 4.22–5.81)
RDW: 21 % — ABNORMAL HIGH (ref 11.5–15.5)
WBC: 9.2 10*3/uL (ref 4.0–10.5)
nRBC: 0 % (ref 0.0–0.2)

## 2021-11-08 LAB — BLOOD GAS, VENOUS
Acid-base deficit: 2.9 mmol/L — ABNORMAL HIGH (ref 0.0–2.0)
Bicarbonate: 20.4 mmol/L (ref 20.0–28.0)
O2 Saturation: 63.8 %
Patient temperature: 98.6
pCO2, Ven: 31.6 mmHg — ABNORMAL LOW (ref 44.0–60.0)
pH, Ven: 7.427 (ref 7.250–7.430)
pO2, Ven: 34.6 mmHg (ref 32.0–45.0)

## 2021-11-08 LAB — BASIC METABOLIC PANEL
Anion gap: 3 — ABNORMAL LOW (ref 5–15)
BUN: 30 mg/dL — ABNORMAL HIGH (ref 8–23)
CO2: 20 mmol/L — ABNORMAL LOW (ref 22–32)
Calcium: 7.8 mg/dL — ABNORMAL LOW (ref 8.9–10.3)
Chloride: 126 mmol/L — ABNORMAL HIGH (ref 98–111)
Creatinine, Ser: 2.36 mg/dL — ABNORMAL HIGH (ref 0.61–1.24)
GFR, Estimated: 26 mL/min — ABNORMAL LOW (ref >60)
Glucose, Bld: 101 mg/dL — ABNORMAL HIGH (ref 70–99)
Potassium: 3 mmol/L — ABNORMAL LOW (ref 3.5–5.1)
Sodium: 149 mmol/L — ABNORMAL HIGH (ref 135–145)

## 2021-11-08 LAB — VITAMIN B12: Vitamin B-12: 2800 pg/mL — ABNORMAL HIGH (ref 180–914)

## 2021-11-08 LAB — TSH: TSH: 15.382 u[IU]/mL — ABNORMAL HIGH (ref 0.350–4.500)

## 2021-11-08 LAB — PHOSPHORUS: Phosphorus: 2.5 mg/dL (ref 2.5–4.6)

## 2021-11-08 LAB — MAGNESIUM: Magnesium: 2.2 mg/dL (ref 1.7–2.4)

## 2021-11-08 LAB — FOLATE: Folate: 38.5 ng/mL (ref >5.9)

## 2021-11-08 MED ORDER — VANCOMYCIN HCL 1250 MG/250ML IV SOLN: 1250.0000 mg | INTRAVENOUS | Status: DC

## 2021-11-08 MED ORDER — POTASSIUM CHLORIDE 10 MEQ/100ML IV SOLN
10.0000 meq | INTRAVENOUS | Status: AC
  Administered 2021-11-08 (×4): 10 meq via INTRAVENOUS
  Filled 2021-11-08 (×4): qty 100

## 2021-11-08 MED ORDER — POTASSIUM CL IN DEXTROSE 5% 20 MEQ/L IV SOLN
20.0000 meq | INTRAVENOUS | Status: DC
  Administered 2021-11-08: 22:00:00 20 meq via INTRAVENOUS
  Filled 2021-11-08: qty 1000

## 2021-11-08 MED ORDER — VANCOMYCIN HCL 1500 MG/300ML IV SOLN
1500.0000 mg | Freq: Once | INTRAVENOUS | Status: AC
  Administered 2021-11-08: 22:00:00 1500 mg via INTRAVENOUS
  Filled 2021-11-08: qty 300

## 2021-11-08 MED ORDER — METRONIDAZOLE 500 MG/100ML IV SOLN
500.0000 mg | Freq: Two times a day (BID) | INTRAVENOUS | Status: DC
  Administered 2021-11-08 – 2021-11-09 (×2): 500 mg via INTRAVENOUS
  Filled 2021-11-08 (×2): qty 100

## 2021-11-08 MED ORDER — DEXTROSE 5 % IV SOLN: INTRAVENOUS | Status: DC

## 2021-11-08 NOTE — Plan of Care (Signed)
  Problem: Education: Goal: Knowledge of General Education information will improve Description: Including pain rating scale, medication(s)/side effects and non-pharmacologic comfort measures Outcome: Progressing   Problem: Clinical Measurements: Goal: Ability to maintain clinical measurements within normal limits will improve Outcome: Progressing Goal: Will remain free from infection Outcome: Progressing Goal: Diagnostic test results will improve Outcome: Progressing Goal: Respiratory complications will improve Outcome: Progressing Goal: Cardiovascular complication will be avoided Outcome: Progressing   Problem: Activity: Goal: Risk for activity intolerance will decrease Outcome: Progressing   Problem: Nutrition: Goal: Adequate nutrition will be maintained Outcome: Progressing   Problem: Safety: Goal: Ability to remain free from injury will improve Outcome: Progressing   Problem: Skin Integrity: Goal: Risk for impaired skin integrity will decrease Outcome: Progressing   

## 2021-11-08 NOTE — Progress Notes (Signed)
PT Cancellation Note  Patient Details Name: Anthony Dudley MRN: [id number] DOB: 1933/06/23   Cancelled Treatment:    Reason Eval/Treat Not Completed: Other (comment) (too lethargic). RN and NT in room, report pt too lethargic to participate in PT eval at this time, will check back as schedule permits.    Tori Sheenah Dimitroff PT, DPT 11/08/21, 10:00 AM

## 2021-11-08 NOTE — Progress Notes (Signed)
SLP Cancellation Note  Patient Details Name: Anthony Dudley MRN: [id number] DOB: 04/04/1933   Cancelled treatment:       Reason Eval/Treat Not Completed: Patient's level of consciousness. Spoke with patient's RN who stated that patient has been very sleepy and lethargic today. SLP will f/u in next 1-2 dates.   Sonia Baller, MA, CCC-SLP Speech Therapy

## 2021-11-08 NOTE — Progress Notes (Signed)
Pharmacy Antibiotic Note  Anthony Dudley is a 87 y.o. male admitted on 11/03/2021.  11/08/2021 Day #5 of cefepime for sepsis secondary to urinary source. Cx shows multiple species. AoCKD (baseline SCr ~2.0). Trending down to 2.46. Per MD note: "CXR 12/26 with patchy infiltrates in the lungs MRSA PCR 12/21 was positive Will broaden from cefepime and add vanc and flagyl as well to cover MRSA pneumonia and aspiration pneumonia"  Plan: Cefepime 2gm Q24h  Flagyl 500 mg IV q12 per MD Vancomycin 1500 mg IV x 1 dose followed by vancomycin 1250 mg IV q48 for est AUC 517.3, Css min 12.2 using SCr 2.32, Vd 0.72 F/u renal function, WBC, temp Vancomycin levels as needed   Height: 5\' 9"  (175.3 cm) Weight: 72.8 kg (160 lb 7.9 oz) IBW/kg (Calculated) : 70.7  Temp (24hrs), Avg:97.9 F (36.6 C), Min:97.6 F (36.4 C), Max:98.4 F (36.9 C)  Recent Labs  Lab 11/03/21 1630 11/03/21 1631 11/03/21 2229 11/04/21 0235 11/05/21 0236 11/05/21 1507 11/06/21 0812 11/07/21 0247 11/08/21 0449 11/08/21 1553  WBC  --    < > 11.7* 11.4* 9.3 9.1  --  7.9 9.2  --   CREATININE  --    < > 5.40*   5.25* 4.83* 3.79*  --  2.88* 2.59* 2.46* 2.36*  LATICACIDVEN 1.5  --  1.1  --   --   --   --   --   --   --   VANCORANDOM  --   --   --   --  12  --   --   --   --   --    < > = values in this interval not displayed.     Estimated Creatinine Clearance: 22.5 mL/min (A) (by C-G formula based on SCr of 2.36 mg/dL (H)).    Allergies  Allergen Reactions   Solanum Lycopersicum [Tomato]     Patient reports he is not allergic to tomato and he eats them all the time   Antimicrobials this admission: 2/21 Vancomycin & Flagyl x 1  12/21 Cefepime >>  12/26 flagyl >> 12/26 vancomycin>>  Dose adjustments this admission: 12/23 Cefepime 1gm q24 > 2gm q24  Microbiology results: 12/21 BCx: ngF 12/21 UCx: mult sp- final 12/21 MRSA PCR: pos  Thank you for allowing pharmacy to be a part of this patients  care.  Eudelia Bunch, Pharm.D 11/08/2021 7:32 PM

## 2021-11-08 NOTE — Assessment & Plan Note (Addendum)
Unclear etiology. TSH 9.58 and free T4 0.72 on 12/30. No other acute etiology of tachycardia identified. Currently treating with IV Lopressor as needed. asymptomatic.

## 2021-11-08 NOTE — Progress Notes (Signed)
Pharmacy Antibiotic Note  Anthony Dudley is a 87 y.o. male admitted on 11/03/2021 with sepsis.  Pharmacy has been consulted for Vancomycin dosing.  Day #5 of cefepime for sepsis secondary to urinary source. Cx shows multiple species. AoCKD (baseline SCr ~2.0). Trending down to 2.46.  Plan: Cefepime 2gm Q24h  Consider 7 days   Height: 5\' 9"  (175.3 cm) Weight: 72.8 kg (160 lb 7.9 oz) IBW/kg (Calculated) : 70.7  Temp (24hrs), Avg:98 F (36.7 C), Min:97.6 F (36.4 C), Max:98.5 F (36.9 C)  Recent Labs  Lab 11/03/21 1630 11/03/21 1631 11/03/21 2229 11/04/21 0235 11/05/21 0236 11/05/21 1507 11/06/21 0812 11/07/21 0247 11/08/21 0449  WBC  --    < > 11.7* 11.4* 9.3 9.1  --  7.9 9.2  CREATININE  --    < > 5.40*   5.25* 4.83* 3.79*  --  2.88* 2.59* 2.46*  LATICACIDVEN 1.5  --  1.1  --   --   --   --   --   --   VANCORANDOM  --   --   --   --  12  --   --   --   --    < > = values in this interval not displayed.     Estimated Creatinine Clearance: 21.6 mL/min (A) (by C-G formula based on SCr of 2.46 mg/dL (H)).    Allergies  Allergen Reactions   Solanum Lycopersicum [Tomato]     Patient reports he is not allergic to tomato and he eats them all the time   Antimicrobials this admission: 2/21 Vancomycin & Flagyl x 1  12/21 Cefepime >>   Dose adjustments this admission: 12/23 Cefepime 1gm q24 > 2gm q24  Microbiology results: 12/21 BCx: ngF 12/21 UCx: mult sp- final 12/21 MRSA PCR: pos  Thank you for allowing pharmacy to be a part of this patients care.  Elenor Quinones, PharmD, BCPS, BCIDP Clinical Pharmacist 11/08/2021 10:11 AM

## 2021-11-08 NOTE — Assessment & Plan Note (Deleted)
CXR 12/26 with patchy infiltrates in the lungs MRSA PCR 12/21 was positive Will broaden from cefepime and add vanc and flagyl as well to cover MRSA pneumonia and aspiration pneumonia

## 2021-11-08 NOTE — Progress Notes (Signed)
Patients HR increased to 140s. EKG done and Provider notified. Orders to continue to monitor. Patient asymptomatic at this time. Sleeping without distress

## 2021-11-08 NOTE — Progress Notes (Addendum)
PROGRESS NOTE    Anthony Dudley  VEH:[id number] DOB: [birth year] DOA: 11/03/2021 PCP: [name], MD  Chief Complaint  Patient presents with   Hypotension   Fever    Brief Narrative:  87 yo with CKD, HTN, HF, dementia, prostate cancer who was hospitalized on October with sepsis and AKI on CKD 2/2 obstruction related to metastatic prostate cancer.  He's being treated at this hospitalization for septic shock 2/2 UTI.  He was initially managed in the ICU with need for pressors.  He's been transferred to the hospitalist service on 12/25.  See below for additional details    Assessment & Plan:   Principal Problem:   Septic shock due to urinary tract infection ([place]) Active Problems:   Hypernatremia   Acute metabolic encephalopathy   Acute kidney injury superimposed on CKD (HCC)   Sinus tachycardia   Prostate cancer metastatic to multiple sites [facility])   Pneumonia   Acute urinary retention   Lower extremity edema   Anemia   Hypokalemia   Dysphagia   Essential hypertension   Legally blind   * Septic shock due to urinary tract infection ([place])- (present on admission) Presented to preop visit with urology, found to be febrile and tachycardic with urinary retention with >800 cc dark murky urine Given IM ceftriaxone and sent to ED Initially admitted to ICU with need for pressors -> weaned off  Vanc/cefepime/flagyl x1 on 12/21 Cefepime 12/21-present, plan for at least 7 days Urine cx with multiple species.  Per previous notes, prior cx with MDR proteus sensitive to cefepime (09/2019) Blood cx NG CT abd pelvis with mild bilateral hydro (decreased from prior) - bilateral ureteral stents  Acute metabolic encephalopathy 2/2 acute infection Delirium precautions Seems worse than his baseline  B12, VBG, folate, TSH, follow head CT Treat hypernatremia NPO given mental status CXR with pneumonia, which could contribute  Hypernatremia- (present on admission) Start dextrose  containing fluids  Sinus tachycardia Tachy last night, improved at this time, sinus on ekg follow  Acute kidney injury superimposed on CKD ([place]) Baseline creatinine 2.2 Peaked at 6.08 here, improving, follow 2/2 sepsis S/p bilateral antegrade ureter stents on 10/18 by IR 2/2 obstructive uropathy (initially bilateral perc nephrostomy tubes placed 08/19/21)  Acute urinary retention plan for suprapubic catheter outpatient with urology  Pneumonia CXR 12/26 with patchy infiltrates in the lungs MRSA PCR 12/21 was positive Will broaden from cefepime and add vanc and flagyl as well to cover MRSA pneumonia and aspiration pneumonia  Prostate cancer metastatic to multiple sites [facility]) PSA 2,268 10/22 Follows with urology, extensive bone mets     Lower extremity edema LE [place] negative for DVT on L  Hypokalemia Replace and follow  Anemia Trend S/p 1 unit prbc  Essential hypertension BP meds on hold, BP reasobale today (doesn't appear he's on many outpatient)  Dysphagia SLP eval, dysphagia 1 nectar thick  Legally blind Brother ordering glases   DVT prophylaxis: hearin Code Status: full Family Communication: ben, brother - called again 12/26, he'll call back Disposition:   Status is: Inpatient  Remains inpatient appropriate because: need for iv abx       Consultants:  pccm  Procedures:  none  Antimicrobials:  Anti-infectives (From admission, onward)    Start     Dose/Rate Route Frequency Ordered Stop   11/08/21 2000  metroNIDAZOLE (FLAGYL) IVPB 500 mg        500 mg 100 mL/hr over 60 Minutes Intravenous Every 12 hours 11/08/21 1908  11/05/21 1700  ceFEPIme (MAXIPIME) 2 g in sodium chloride 0.9 % 100 mL IVPB        2 g 200 mL/hr over 30 Minutes Intravenous Every 24 hours 11/05/21 1026     11/04/21 1700  ceFEPIme (MAXIPIME) 1 g in sodium chloride 0.9 % 100 mL IVPB  Status:  Discontinued        1 g 200 mL/hr over 30 Minutes Intravenous Every 24 hours 11/03/21  2034 11/05/21 1026   11/04/21 1132  vancomycin variable dose per unstable renal function (pharmacist dosing)  Status:  Discontinued         Does not apply See admin instructions 11/04/21 1132 11/05/21 0954   11/03/21 1530  ceFEPIme (MAXIPIME) 2 g in sodium chloride 0.9 % 100 mL IVPB        2 g 200 mL/hr over 30 Minutes Intravenous  Once 11/03/21 1518 11/03/21 2000   11/03/21 1530  metroNIDAZOLE (FLAGYL) IVPB 500 mg        500 mg 100 mL/hr over 60 Minutes Intravenous  Once 11/03/21 1518 11/03/21 2000   11/03/21 1530  vancomycin (VANCOCIN) IVPB 1000 mg/200 mL premix        1,000 mg 200 mL/hr over 60 Minutes Intravenous  Once 11/03/21 1518 11/03/21 2000       Subjective: Confused, difficult to understand  Objective: Vitals:   11/08/21 0615 11/08/21 0920 11/08/21 1321 11/08/21 1713  BP: 124/66 126/63 (!) 123/57 (!) 115/57  Pulse: (!) 112 (!) 107 91 88  Resp: (!) 22 20 20 20   Temp: 97.9 F (36.6 C) 98.4 F (36.9 C) 98.1 F (36.7 C) 98 F (36.7 C)  TempSrc: Axillary Oral Oral Oral  SpO2: 93% 97% 98% 97%  Weight: 72.8 kg     Height:        Intake/Output Summary (Last 24 hours) at 11/08/2021 1910 Last data filed at 11/08/2021 1508 Gross per 24 hour  Intake 1224.03 ml  Output 1800 ml  Net -575.97 ml   Filed Weights   11/06/21 0500 11/07/21 0444 11/08/21 0615  Weight: 77.7 kg 78.7 kg 72.8 kg    Examination:  General: No acute distress. Cardiovascular: RRR Lungs: unlabored Abdomen: Soft, nontender, nondistended  Neurological: difficult to understand, some appropriate responses, but speaks quietly and hard to understand in general, somewhat lethargic Extremities: No clubbing or cyanosis. No edema.   Data Reviewed: I have personally reviewed following labs and imaging studies  CBC: Recent Labs  Lab 11/03/21 1631 11/03/21 2229 11/04/21 0235 11/05/21 0236 11/05/21 1507 11/07/21 0247 11/08/21 0449  WBC 12.0*   < > 11.4* 9.3 9.1 7.9 9.2  NEUTROABS 10.1*  --   --    --   --   --  4.9  HGB 8.3*   < > 7.0* 6.8* 8.1* 7.6* 9.3*  HCT 26.9*   < > 22.7* 21.9* 25.5* 24.0* 30.0*  MCV 105.5*   < > 106.1* 102.8* 98.8 100.0 101.7*  PLT 290   < > 257 254 236 220 254   < > = values in this interval not displayed.    Basic Metabolic Panel: Recent Labs  Lab 11/04/21 0235 11/05/21 0236 11/06/21 0812 11/07/21 0247 11/07/21 0923 11/08/21 0449 11/08/21 1553  NA 141 142 144 145  --  151* 149*  K 3.7 2.7* 2.5* 3.0* 3.8 3.5 3.0*  CL 113* 114* 116* 120*  --  123* 126*  CO2 19* 19* 21* 20*  --  19* 20*  GLUCOSE 117* 83 88  95  --  100* 101*  BUN 102* 75* 50* 41*  --  33* 30*  CREATININE 4.83* 3.79* 2.88* 2.59*  --  2.46* 2.36*  CALCIUM 7.8* 8.0* 7.8* 7.7*  --  8.3* 7.8*  MG 2.9*  --   --  2.2  --  2.2  --   PHOS 3.0  --   --   --   --  2.5  --     GFR: Estimated Creatinine Clearance: 22.5 mL/min (Jaine Estabrooks) (by C-G formula based on SCr of 2.36 mg/dL (H)).  Liver Function Tests: Recent Labs  Lab 11/03/21 1631 11/08/21 0449  AST 13* 20  ALT 12 14  ALKPHOS 418* 295*  BILITOT 0.9 0.7  PROT 7.3 6.4*  ALBUMIN 2.8* 2.2*    CBG: Recent Labs  Lab 11/07/21 0028 11/07/21 0340 11/07/21 0736 11/07/21 1113 11/07/21 1507  GLUCAP 89 87 83 82 77     Recent Results (from the past 240 hour(s))  Blood Culture (routine x 2)     Status: None   Collection Time: 11/03/21  3:55 PM   Specimen: BLOOD  Result Value Ref Range Status   Specimen Description   Final    BLOOD RIGHT ANTECUBITAL Performed at [facility], [place] [address] [place], [place] [address]    Special Requests   Final    BOTTLES DRAWN AEROBIC AND ANAEROBIC Blood Culture results may not be optimal due to an inadequate volume of blood received in culture bottles Performed at [facility] [address], [place], [place] [address]    Culture   Final    NO GROWTH 5 DAYS Performed at [facility], [place] [address], [place], [place] [address]    Report Status  11/08/2021 FINAL  Final  Blood Culture (routine x 2)     Status: None   Collection Time: 11/03/21  4:07 PM   Specimen: BLOOD  Result Value Ref Range Status   Specimen Description   Final    BLOOD RIGHT ANTECUBITAL Performed at [facility] [address] [place], [place] [address]    Special Requests   Final    Blood Culture results may not be optimal due to an inadequate volume of blood received in culture bottles BOTTLES DRAWN AEROBIC AND ANAEROBIC Performed at [facility], [place] [address], [place], [place] [address]    Culture   Final    NO GROWTH 5 DAYS Performed at [facility], [place] [address], [place], [place] [address]    Report Status 11/08/2021 FINAL  Final  Resp Panel by RT-PCR (Flu Kalum Minner&B, Covid) Nasopharyngeal Swab     Status: None   Collection Time: 11/03/21  4:27 PM   Specimen: Nasopharyngeal Swab; Nasopharyngeal(NP) swabs in vial transport medium  Result Value Ref Range Status   SARS Coronavirus 2 by RT PCR NEGATIVE NEGATIVE Final    Comment: (NOTE) SARS-CoV-2 target nucleic acids are NOT DETECTED.  The SARS-CoV-2 RNA is generally detectable in upper respiratory specimens during the acute phase of infection. The lowest concentration of SARS-CoV-2 viral copies this assay can detect is 138 copies/mL. Glinda Natzke negative result does not preclude SARS-Cov-2 infection and should not be used as the sole basis for treatment or other patient management decisions. Harmani Neto negative result may occur with  improper specimen collection/handling, submission of specimen other than nasopharyngeal swab, presence of viral mutation(s) within the areas targeted by this assay, and inadequate number of viral copies(<138 copies/mL).  Vertie Dibbern negative result must be combined with clinical observations, patient history, and epidemiological information. The expected result is Negative.  Fact Sheet for Patients:  EntrepreneurPulse.com.au  Fact Sheet  for Healthcare Providers:  IncredibleEmployment.be  This test is no t yet approved or cleared by the [place] FDA and  has been authorized for detection and/or diagnosis of SARS-CoV-2 by FDA under an Emergency Use Authorization (EUA). This EUA will remain  in effect (meaning this test can be used) for the duration of the COVID-19 declaration under Section 564(b)(1) of the Act, 21 U.S.C.section 360bbb-3(b)(1), unless the authorization is terminated  or revoked sooner.       Influenza Laurianne Floresca by PCR NEGATIVE NEGATIVE Final   Influenza B by PCR NEGATIVE NEGATIVE Final    Comment: (NOTE) The Xpert Xpress SARS-CoV-2/FLU/RSV plus assay is intended as an aid in the diagnosis of influenza from Nasopharyngeal swab specimens and should not be used as Zephan Beauchaine sole basis for treatment. Nasal washings and aspirates are unacceptable for Xpert Xpress SARS-CoV-2/FLU/RSV testing.  Fact Sheet for Patients: EntrepreneurPulse.com.au  Fact Sheet for Healthcare Providers: IncredibleEmployment.be  This test is not yet approved or cleared by the [place] FDA and has been authorized for detection and/or diagnosis of SARS-CoV-2 by FDA under an Emergency Use Authorization (EUA). This EUA will remain in effect (meaning this test can be used) for the duration of the COVID-19 declaration under Section 564(b)(1) of the Act, 21 U.S.C. section 360bbb-3(b)(1), unless the authorization is terminated or revoked.  Performed at [facility], [place] [address] [place], [place] [address]   Urine Culture     Status: Abnormal   Collection Time: 11/03/21  4:28 PM   Specimen: In/Out Cath Urine  Result Value Ref Range Status   Specimen Description   Final    IN/OUT CATH URINE Performed at [facility] [address], [place], [place] [address]    Special Requests   Final    NONE Performed at [facility],  [place] [address] [place], [place] [address]    Culture MULTIPLE SPECIES PRESENT, SUGGEST RECOLLECTION (Ayo Guarino)  Final   Report Status 11/05/2021 FINAL  Final  MRSA Next Gen by PCR, Nasal     Status: Abnormal   Collection Time: 11/03/21 10:00 PM   Specimen: Nasal Mucosa; Nasal Swab  Result Value Ref Range Status   MRSA by PCR Next Gen DETECTED (Tanda Morrissey) NOT DETECTED Final    Comment: CRITICAL RESULT CALLED TO, READ BACK BY AND VERIFIED WITH:  COURTNEY GRIFFIN RN 11/03/21 @ 6578 VS (NOTE) The GeneXpert MRSA Assay (FDA approved for NASAL specimens only), is one component of Georgette Helmer comprehensive MRSA colonization surveillance program. It is not intended to diagnose MRSA infection nor to guide or monitor treatment for MRSA infections. Test performance is not FDA approved in patients less than 26 years old. Performed at [facility], [place] [address] [place], [place] [address]          Radiology Studies: DG CHEST PORT 1 VIEW  Result Date: 11/08/2021 CLINICAL DATA:  New onset altered mental status. History of metastatic prostate cancer. Congestive heart failure. EXAM: PORTABLE CHEST 1 VIEW COMPARISON:  11/03/2021 FINDINGS: Shallow inspiration. Heart size is normal. Suggestion of patchy infiltration in the lungs, most prominent on the left. This could represent multifocal pneumonia, edema, or metastatic disease. No definite pleural effusions. Mediastinal contours appear intact. Diffuse bone sclerosis consistent with known metastatic prostate cancer. Colonic interposition under the right hemidiaphragm. IMPRESSION:  Developing patchy infiltrates in the lungs. Diffuse bone sclerosis consistent with known metastatic prostate cancer. Electronically Signed   By: Lucienne Capers M.D.   On: 11/08/2021 19:02        Scheduled Meds:  chlorhexidine  15 mL Mouth Rinse BID   Chlorhexidine Gluconate Cloth  6 each Topical Daily   heparin  5,000 Units Subcutaneous Q8H   mouth rinse  15 mL Mouth  Rinse q12n4p   Continuous Infusions:  sodium chloride Stopped (11/04/21 0043)   ceFEPime (MAXIPIME) IV 2 g (11/08/21 1722)   dextrose 5 % with KCl 20 mEq / L     metronidazole     potassium chloride       LOS: 5 days    Time spent: over 30 min    Fayrene Helper, MD [facility]   To contact the attending provider between 7A-7P or the covering provider during after hours 7P-7A, please log into the web site [email or web address] and access using universal [place] password for that web site. If you do not have the password, please call the hospital operator.  11/08/2021, 7:10 PM

## 2021-11-08 NOTE — TOC Progression Note (Signed)
Transition of Care [facility]) - Progression Note    Patient Details  Name: Anthony Dudley MRN: [id number] Date of Birth: June 03, 1933  Transition of Care [facility]) CM/SW Contact  Leeroy Cha, RN Phone Number: 11/08/2021, 8:01 AM  Clinical Narrative:     Transition of Care (TOC) Screening Note   Patient Details  Name: Anthony Dudley Date of Birth: 02-10-1933   Transition of Care [facility]) CM/SW Contact:    Leeroy Cha, RN Phone Number: 11/08/2021, 8:01 AM    Transition of Care Department [facility]) has reviewed patient and no TOC needs have been identified at this time. We will continue to monitor patient advancement through interdisciplinary progression rounds. If new patient transition needs arise, please place a TOC consult.       Barriers to Discharge: Continued Medical Work up  Expected Discharge Plan and Services                                                 Social Determinants of Health (SDOH) Interventions    Readmission Risk Interventions No flowsheet data found.

## 2021-11-08 NOTE — Assessment & Plan Note (Deleted)
Corrected.  Continue IV fluid.  This is recurrent issue for the patient secondary to poor p.o. intake.

## 2021-11-08 NOTE — Plan of Care (Signed)
  Problem: Nutrition: Goal: Adequate nutrition will be maintained Outcome: Not Progressing   

## 2021-11-09 ENCOUNTER — Inpatient Hospital Stay (HOSPITAL_COMMUNITY): Payer: Medicare Other

## 2021-11-09 DIAGNOSIS — Z7189 Other specified counseling: Secondary | ICD-10-CM

## 2021-11-09 DIAGNOSIS — J690 Pneumonitis due to inhalation of food and vomit: Secondary | ICD-10-CM | POA: Diagnosis present

## 2021-11-09 DIAGNOSIS — R6521 Severe sepsis with septic shock: Secondary | ICD-10-CM | POA: Diagnosis not present

## 2021-11-09 DIAGNOSIS — N390 Urinary tract infection, site not specified: Secondary | ICD-10-CM | POA: Diagnosis not present

## 2021-11-09 DIAGNOSIS — G9341 Metabolic encephalopathy: Secondary | ICD-10-CM | POA: Diagnosis not present

## 2021-11-09 DIAGNOSIS — A419 Sepsis, unspecified organism: Secondary | ICD-10-CM | POA: Diagnosis not present

## 2021-11-09 LAB — CBC WITH DIFFERENTIAL/PLATELET
Abs Immature Granulocytes: 0.61 10*3/uL — ABNORMAL HIGH (ref 0.00–0.07)
Basophils Absolute: 0 10*3/uL (ref 0.0–0.1)
Basophils Relative: 1 %
Eosinophils Absolute: 0.4 10*3/uL (ref 0.0–0.5)
Eosinophils Relative: 4 %
HCT: 25.9 % — ABNORMAL LOW (ref 39.0–52.0)
Hemoglobin: 8.1 g/dL — ABNORMAL LOW (ref 13.0–17.0)
Immature Granulocytes: 7 %
Lymphocytes Relative: 26 %
Lymphs Abs: 2.3 10*3/uL (ref 0.7–4.0)
MCH: 31.9 pg (ref 26.0–34.0)
MCHC: 31.3 g/dL (ref 30.0–36.0)
MCV: 102 fL — ABNORMAL HIGH (ref 80.0–100.0)
Monocytes Absolute: 0.4 10*3/uL (ref 0.1–1.0)
Monocytes Relative: 5 %
Neutro Abs: 5 10*3/uL (ref 1.7–7.7)
Neutrophils Relative %: 57 %
Platelets: 228 10*3/uL (ref 150–400)
RBC: 2.54 MIL/uL — ABNORMAL LOW (ref 4.22–5.81)
RDW: 20.9 % — ABNORMAL HIGH (ref 11.5–15.5)
WBC: 8.8 10*3/uL (ref 4.0–10.5)
nRBC: 0 % (ref 0.0–0.2)

## 2021-11-09 LAB — COMPREHENSIVE METABOLIC PANEL
ALT: 12 U/L (ref 0–44)
AST: 15 U/L (ref 15–41)
Albumin: 1.9 g/dL — ABNORMAL LOW (ref 3.5–5.0)
Alkaline Phosphatase: 263 U/L — ABNORMAL HIGH (ref 38–126)
Anion gap: 7 (ref 5–15)
BUN: 26 mg/dL — ABNORMAL HIGH (ref 8–23)
CO2: 19 mmol/L — ABNORMAL LOW (ref 22–32)
Calcium: 8 mg/dL — ABNORMAL LOW (ref 8.9–10.3)
Chloride: 123 mmol/L — ABNORMAL HIGH (ref 98–111)
Creatinine, Ser: 2.18 mg/dL — ABNORMAL HIGH (ref 0.61–1.24)
GFR, Estimated: 29 mL/min — ABNORMAL LOW (ref >60)
Glucose, Bld: 114 mg/dL — ABNORMAL HIGH (ref 70–99)
Potassium: 3.6 mmol/L (ref 3.5–5.1)
Sodium: 149 mmol/L — ABNORMAL HIGH (ref 135–145)
Total Bilirubin: 0.5 mg/dL (ref 0.3–1.2)
Total Protein: 5.7 g/dL — ABNORMAL LOW (ref 6.5–8.1)

## 2021-11-09 LAB — BASIC METABOLIC PANEL
Anion gap: 6 (ref 5–15)
BUN: 24 mg/dL — ABNORMAL HIGH (ref 8–23)
CO2: 19 mmol/L — ABNORMAL LOW (ref 22–32)
Calcium: 8.2 mg/dL — ABNORMAL LOW (ref 8.9–10.3)
Chloride: 121 mmol/L — ABNORMAL HIGH (ref 98–111)
Creatinine, Ser: 2.14 mg/dL — ABNORMAL HIGH (ref 0.61–1.24)
GFR, Estimated: 29 mL/min — ABNORMAL LOW (ref >60)
Glucose, Bld: 96 mg/dL (ref 70–99)
Potassium: 3.3 mmol/L — ABNORMAL LOW (ref 3.5–5.1)
Sodium: 146 mmol/L — ABNORMAL HIGH (ref 135–145)

## 2021-11-09 LAB — CORTISOL: Cortisol, Plasma: 7.7 ug/dL

## 2021-11-09 LAB — MAGNESIUM: Magnesium: 2 mg/dL (ref 1.7–2.4)

## 2021-11-09 LAB — PHOSPHORUS: Phosphorus: 2.6 mg/dL (ref 2.5–4.6)

## 2021-11-09 MED ORDER — PIPERACILLIN-TAZOBACTAM 3.375 G IVPB
3.3750 g | Freq: Three times a day (TID) | INTRAVENOUS | Status: DC
  Administered 2021-11-09 – 2021-11-15 (×17): 3.375 g via INTRAVENOUS
  Filled 2021-11-09 (×17): qty 50

## 2021-11-09 MED ORDER — POTASSIUM CL IN DEXTROSE 5% 20 MEQ/L IV SOLN
20.0000 meq | INTRAVENOUS | Status: DC
  Administered 2021-11-09 – 2021-11-10 (×3): 20 meq via INTRAVENOUS
  Filled 2021-11-09 (×3): qty 1000

## 2021-11-09 NOTE — Care Management Important Message (Signed)
Important Message  Patient Details IM Letter placed in Patients room. Name: Anthony Dudley MRN: [id number] Date of Birth: 03/15/1933   Medicare Important Message Given:  Yes     Kerin Salen 11/09/2021, 3:57 PM

## 2021-11-09 NOTE — Progress Notes (Signed)
Speech Language Pathology Treatment: Dysphagia  Patient Details Name: Joeseph Verville MRN: [id number] DOB: [birth year] Today's Date: 11/09/2021 Time: 1700-1749 SLP Time Calculation (min) (ACUTE ONLY): 18 min  Assessment / Plan / Recommendation Clinical Impression  Pt remains lethargic today, but he was alerted enough to accept some POs - he attended to straw/liquids; maintained eyes closed.  He sipped multiple boluses of thin water, demonstrating multiple sub-swallows per bolus and coughing consistently.  Nectar thick water did not elicit a cough response, suggesting improved airway protection. Mr. Gotay did not follow commands nor verbalize today.  Recommend continuing dysphagia 1 diet with nectar thick liquids as able; crush meds.  SLP will follow while admitted.     HPI HPI: 87 year old gentleman with PMH significant for CKD, hypertension, GERD, CHF, dementia and prostate cancer. He was hospitalized at [facility] hospital in 10/22, surgery with urology 12/15 - now w/ Urinary retention, CT imaging, ascites, left lower lobe nodule/ ? Infection vs inflammatory 11/04/2021.  Pt possibly aspirated with dyspnea and speech/swallow eval ordered.  During October hospital admission, pt had a short term feeding tube placed. He remains full code - and was placed on dys1/nectar diet. AT SNF he was on pureed/thins. Follow up for dysphagia management indicated.      SLP Plan  Continue with current plan of care      Recommendations for follow up therapy are one component of a multi-disciplinary discharge planning process, led by the attending physician.  Recommendations may be updated based on patient status, additional functional criteria and insurance authorization.    Recommendations  Diet recommendations: Dysphagia 1 (puree);Nectar-thick liquid Liquids provided via: Straw;Cup Medication Administration: Crushed with puree Supervision: Trained caregiver to feed patient Compensations: Minimize environmental  distractions Postural Changes and/or Swallow Maneuvers: Seated upright 90 degrees;Upright 30-60 min after meal                Oral Care Recommendations: Oral care BID Follow Up Recommendations: Follow physician's recommendations for discharge plan and follow up therapies Assistance recommended at discharge: Frequent or constant Supervision/Assistance SLP Visit Diagnosis: Dysphagia, oropharyngeal phase (R13.12) Plan: Continue with current plan of care         Hara Milholland L. Tivis Ringer, [place] CCC/SLP Acute Rehabilitation Services Office number [phone] Pager [phone]   Juan Quam Laurice  11/09/2021, 10:24 AM

## 2021-11-09 NOTE — Progress Notes (Signed)
PT Cancellation Note  Patient Details Name: Anthony Dudley MRN: [id number] DOB: [birth year]   Cancelled Treatment:    Reason Eval/Treat Not Completed: Medical issues which prohibited therapy RN reports pt only responding to stimuli/assessment with moaning.     Anthony Dudley 11/09/2021, 10:48 AM Arlyce Dice, DPT Acute Rehabilitation Services Pager: [phone] Office: [phone]

## 2021-11-09 NOTE — Assessment & Plan Note (Addendum)
Discussed code status with brother ben, he desires to continue full code based on his conversation with Pt in the past. Would be interested in feeding tube if needed.  Understands the risk of aspiration with the PEG tube placement as well as general risk of aspiration with patient's dysphagia. Palliative care has been consulted in the past with similar recommendation.

## 2021-11-09 NOTE — Consult Note (Signed)
Urology Consult   Physician requesting consult: Dr. Florene Glen  Reason for consult: Urinary retention and metastatic prostate cancer  History of Present Illness: Anthony Dudley is a 87 y.o. who is well known to me.  I was notified of his hospitalization with plans for surgery pending for Thursday.  He has widespread metastatic prostate cancer.  Started on ADT during prior hospitalization in October.  Initial PSA was over 2000 and decreased to 531 on 09/22/21 as an outpatient.  He has remained on ADT with this re-dosed in November and was recently approved to begin enzalutamide at his nursing facility.  He has long standing chronic urinary retention for multiple years and has been catheter dependent.  He also has bilateral ureteral obstruction managed with bilateral ureteral stents placed antegrade during his hospitalization in the fall. He was noted to have urethral erosion and is scheduled SP tube placement along with exchange of his bilateral ureteral stents on Thursday. He presented to our office last Wednesday for a routine catheter change and urine culture in preparation for his surgery this week. He was noted to be hypotensive with grossly purulent urine and poor drainage from his catheter.  His catheter was changed and he was given IM ceftriaxone and sent to the ED due to hypotension.  He was admitted to the ICU and required pressors and IV antibiotic treatment.  His cultures have not been helpful with multiple species noted.  He is now on Cefepime, vancomycin, and metronidazole.  His renal function has returned to baseline and has clinically improved and was transferred to the floor on Sunday.   Past Medical History:  Diagnosis Date   Adult failure to thrive    Anemia    Anxiety    Cancer (HCC)    metastatic prostate cancer   CHF (congestive heart failure) (HCC)    Chronic kidney disease    stage 3   Dementia (HCC)    Depression    Dysphagia    Foley catheter in place    GERD  (gastroesophageal reflux disease)    Hematuria    History of blood transfusion 08/20/2021   Hypernatremia    Hyponatremia    hx of   Malnutrition (HCC)    Obstructive and reflux uropathy    Physical debility    Severe sepsis with septic shock (CODE) (HCC)    hx of   SVT (supraventricular tachycardia) (HCC)    hx of   Urethral obstruction    bilateral   Urinary tract infection    hx of    Past Surgical History:  Procedure Laterality Date   CYSTOSCOPY WITH RETROGRADE PYELOGRAM, URETEROSCOPY AND STENT PLACEMENT Bilateral 08/18/2021   Procedure: 1.  Cystoscopy 2.  Fulguration of bladder;  Surgeon: Raynelle Bring, MD;  Location: [facility];  Service: Urology;  Laterality: Bilateral;   IR NEPHRO TUBE REMOV/FL  09/06/2021   IR NEPHRO TUBE REMOV/FL  09/06/2021   IR NEPHROSTOMY PLACEMENT LEFT  08/19/2021   IR NEPHROSTOMY PLACEMENT RIGHT  08/19/2021   IR URETERAL STENT PLACEMENT EXISTING ACCESS LEFT  08/31/2021   IR URETERAL STENT PLACEMENT EXISTING ACCESS RIGHT  08/31/2021   TONSILECTOMY, ADENOIDECTOMY, BILATERAL MYRINGOTOMY AND TUBES      Current Hospital Medications:  Home Meds:  No current facility-administered medications on file prior to encounter.   Current Outpatient Medications on File Prior to Encounter  Medication Sig Dispense Refill   abiraterone acetate (ZYTIGA) 250 MG tablet Take 1,000 mg by mouth daily.     Cholecalciferol (  VITAMIN D-3) 125 MCG (5000 UT) TABS Take 5,000 Units by mouth daily.     famotidine (PEPCID) 20 MG tablet Take 20 mg by mouth daily.     folic acid (FOLVITE) 1 MG tablet Place 1 tablet (1 mg total) into feeding tube daily.     magnesium oxide (MAG-OX) 400 (240 Mg) MG tablet Take 1 tablet (400 mg total) by mouth 2 (two) times daily.     mirtazapine (REMERON) 7.5 MG tablet Take 7.5 mg by mouth at bedtime.     Multiple Vitamin (MULTIVITAMIN WITH MINERALS) TABS tablet Place 1 tablet into feeding tube daily.     Nutritional Supplements (FEEDING SUPPLEMENT,  PROSOURCE TF,) liquid Place 45 mLs into feeding tube daily.     oxybutynin (DITROPAN) 5 MG tablet Place 0.5 tablets (2.5 mg total) into feeding tube 2 (two) times daily.     Pollen Extracts (PROSTAT PO) Take 1 tablet by mouth in the morning and at bedtime.     vitamin B-12 (CYANOCOBALAMIN) 1000 MCG tablet Take 2,000 mcg by mouth daily.     acetaminophen (TYLENOL) 325 MG tablet Take 2 tablets (650 mg total) by mouth every 6 (six) hours as needed for mild pain or fever. (Patient not taking: Reported on 11/03/2021)     food thickener (SIMPLYTHICK, HONEY/LEVEL 3/MODERATELY THICK,) LIQD Take 1 packet by mouth as needed.     LORazepam (ATIVAN) 2 MG/ML injection Inject 0.25 mLs (0.5 mg total) into the vein every 6 (six) hours as needed for sedation or anxiety. (Patient not taking: Reported on 11/03/2021) 1 mL 0   metoprolol tartrate (LOPRESSOR) 5 MG/5ML SOLN injection Inject 5 mLs (5 mg total) into the vein every 5 (five) minutes as needed (sustained HR >734, hold for systolic BP less than 287). (Patient not taking: Reported on 11/03/2021) 15 mL    Nutritional Supplements (FEEDING SUPPLEMENT, OSMOLITE 1.2 CAL,) LIQD Place 1,000 mLs into feeding tube every 12 (twelve) hours. (Patient not taking: Reported on 11/03/2021)  0   Water For Irrigation, Sterile (FREE WATER) SOLN Place 125 mLs into feeding tube every 4 (four) hours.       Scheduled Meds:  chlorhexidine  15 mL Mouth Rinse BID   Chlorhexidine Gluconate Cloth  6 each Topical Daily   heparin  5,000 Units Subcutaneous Q8H   mouth rinse  15 mL Mouth Rinse q12n4p   Continuous Infusions:  sodium chloride Stopped (11/04/21 0043)   ceFEPime (MAXIPIME) IV Stopped (11/08/21 1752)   dextrose 5 % with KCl 20 mEq / L Stopped (11/08/21 2140)   metronidazole Stopped (11/08/21 2047)   [START ON 11/10/2021] vancomycin     PRN Meds:.docusate sodium, food thickener, polyethylene glycol  Allergies:  Allergies  Allergen Reactions   Solanum Lycopersicum  [Tomato]     Patient reports he is not allergic to tomato and he eats them all the time    History reviewed. No pertinent family history.  Social History:  reports that he has never smoked. He has never used smokeless tobacco. He reports that he does not drink alcohol and does not use drugs.  ROS: A complete review of systems was performed.  All systems are negative except for pertinent findings as noted.  Physical Exam:  Vital signs in last 24 hours: Temp:  [98 F (36.7 C)-98.4 F (36.9 C)] 98.1 F (36.7 C) (12/27 0507) Pulse Rate:  [88-131] 88 (12/27 0507) Resp:  [13-20] 20 (12/27 0507) BP: (109-126)/(57-68) 116/61 (12/27 0507) SpO2:  [97 %-98 %] 98 % (  12/27 0507) Weight:  [71.7 kg] 71.7 kg (12/27 0500) Constitutional: Pt not verbal this morning consistent with baseline mental status.  Laboratory Data:  Recent Labs    11/07/21 0247 11/08/21 0449 11/09/21 0446  WBC 7.9 9.2 8.8  HGB 7.6* 9.3* 8.1*  HCT 24.0* 30.0* 25.9*  PLT 220 254 228    Recent Labs    11/06/21 0812 11/07/21 0247 11/07/21 0923 11/08/21 0449 11/08/21 1553 11/09/21 0446  NA 144 145  --  151* 149* 149*  K 2.5* 3.0* 3.8 3.5 3.0* 3.6  CL 116* 120*  --  123* 126* 123*  GLUCOSE 88 95  --  100* 101* 114*  BUN 50* 41*  --  33* 30* 26*  CALCIUM 7.8* 7.7*  --  8.3* 7.8* 8.0*  CREATININE 2.88* 2.59*  --  2.46* 2.36* 2.18*     Results for orders placed or performed during the hospital encounter of 11/03/21 (from the past 24 hour(s))  Basic metabolic panel     Status: Abnormal   Collection Time: 11/08/21  3:53 PM  Result Value Ref Range   Sodium 149 (H) 135 - 145 mmol/L   Potassium 3.0 (L) 3.5 - 5.1 mmol/L   Chloride 126 (H) 98 - 111 mmol/L   CO2 20 (L) 22 - 32 mmol/L   Glucose, Bld 101 (H) 70 - 99 mg/dL   BUN 30 (H) 8 - 23 mg/dL   Creatinine, Ser 2.36 (H) 0.61 - 1.24 mg/dL   Calcium 7.8 (L) 8.9 - 10.3 mg/dL   GFR, Estimated 26 (L) >60 mL/min   Anion gap 3 (L) 5 - 15  Blood gas, venous      Status: Abnormal   Collection Time: 11/08/21  7:12 PM  Result Value Ref Range   pH, Ven 7.427 7.250 - 7.430   pCO2, Ven 31.6 (L) 44.0 - 60.0 mmHg   pO2, Ven 34.6 32.0 - 45.0 mmHg   Bicarbonate 20.4 20.0 - 28.0 mmol/L   Acid-base deficit 2.9 (H) 0.0 - 2.0 mmol/L   O2 Saturation 63.8 %   Patient temperature 98.6   TSH     Status: Abnormal   Collection Time: 11/08/21  7:12 PM  Result Value Ref Range   TSH 15.382 (H) 0.350 - 4.500 uIU/mL  Vitamin B12     Status: Abnormal   Collection Time: 11/08/21  7:13 PM  Result Value Ref Range   Vitamin B-12 2,800 (H) 180 - 914 pg/mL  Folate     Status: None   Collection Time: 11/08/21  7:13 PM  Result Value Ref Range   Folate 38.5 >5.9 ng/mL  CBC with Differential/Platelet     Status: Abnormal   Collection Time: 11/09/21  4:46 AM  Result Value Ref Range   WBC 8.8 4.0 - 10.5 K/uL   RBC 2.54 (L) 4.22 - 5.81 MIL/uL   Hemoglobin 8.1 (L) 13.0 - 17.0 g/dL   HCT 25.9 (L) 39.0 - 52.0 %   MCV 102.0 (H) 80.0 - 100.0 fL   MCH 31.9 26.0 - 34.0 pg   MCHC 31.3 30.0 - 36.0 g/dL   RDW 20.9 (H) 11.5 - 15.5 %   Platelets 228 150 - 400 K/uL   nRBC 0.0 0.0 - 0.2 %   Neutrophils Relative % 57 %   Neutro Abs 5.0 1.7 - 7.7 K/uL   Lymphocytes Relative 26 %   Lymphs Abs 2.3 0.7 - 4.0 K/uL   Monocytes Relative 5 %   Monocytes Absolute 0.4 0.1 -  1.0 K/uL   Eosinophils Relative 4 %   Eosinophils Absolute 0.4 0.0 - 0.5 K/uL   Basophils Relative 1 %   Basophils Absolute 0.0 0.0 - 0.1 K/uL   Immature Granulocytes 7 %   Abs Immature Granulocytes 0.61 (H) 0.00 - 0.07 K/uL   Reactive, Benign Lymphocytes PRESENT   Comprehensive metabolic panel     Status: Abnormal   Collection Time: 11/09/21  4:46 AM  Result Value Ref Range   Sodium 149 (H) 135 - 145 mmol/L   Potassium 3.6 3.5 - 5.1 mmol/L   Chloride 123 (H) 98 - 111 mmol/L   CO2 19 (L) 22 - 32 mmol/L   Glucose, Bld 114 (H) 70 - 99 mg/dL   BUN 26 (H) 8 - 23 mg/dL   Creatinine, Ser 2.18 (H) 0.61 - 1.24 mg/dL    Calcium 8.0 (L) 8.9 - 10.3 mg/dL   Total Protein 5.7 (L) 6.5 - 8.1 g/dL   Albumin 1.9 (L) 3.5 - 5.0 g/dL   AST 15 15 - 41 U/L   ALT 12 0 - 44 U/L   Alkaline Phosphatase 263 (H) 38 - 126 U/L   Total Bilirubin 0.5 0.3 - 1.2 mg/dL   GFR, Estimated 29 (L) >60 mL/min   Anion gap 7 5 - 15  Magnesium     Status: None   Collection Time: 11/09/21  4:46 AM  Result Value Ref Range   Magnesium 2.0 1.7 - 2.4 mg/dL  Phosphorus     Status: None   Collection Time: 11/09/21  4:46 AM  Result Value Ref Range   Phosphorus 2.6 2.5 - 4.6 mg/dL   Recent Results (from the past 240 hour(s))  Blood Culture (routine x 2)     Status: None   Collection Time: 11/03/21  3:55 PM   Specimen: BLOOD  Result Value Ref Range Status   Specimen Description   Final    BLOOD RIGHT ANTECUBITAL Performed at [facility], [address] [place], [place] [address]    Special Requests   Final    BOTTLES DRAWN AEROBIC AND ANAEROBIC Blood Culture results may not be optimal due to an inadequate volume of blood received in culture bottles Performed at [facility] [address], [place], [place] [address]    Culture   Final    NO GROWTH 5 DAYS Performed at [facility], [place] [address], [place], [place] [address]    Report Status 11/08/2021 FINAL  Final  Blood Culture (routine x 2)     Status: None   Collection Time: 11/03/21  4:07 PM   Specimen: BLOOD  Result Value Ref Range Status   Specimen Description   Final    BLOOD RIGHT ANTECUBITAL Performed at [facility] [address], [place], [place] [address]    Special Requests   Final    Blood Culture results may not be optimal due to an inadequate volume of blood received in culture bottles BOTTLES DRAWN AEROBIC AND ANAEROBIC Performed at [facility], [place] [address] [place], [place] [address]    Culture   Final    NO GROWTH 5 DAYS Performed at [facility], [place] [address], [place],  [address]    Report Status 11/08/2021 FINAL  Final  Resp Panel by RT-PCR (Flu A&B, Covid) Nasopharyngeal Swab     Status: None   Collection Time: 11/03/21  4:27 PM   Specimen: Nasopharyngeal Swab; Nasopharyngeal(NP) swabs  in vial transport medium  Result Value Ref Range Status   SARS Coronavirus 2 by RT PCR NEGATIVE NEGATIVE Final    Comment: (NOTE) SARS-CoV-2 target nucleic acids are NOT DETECTED.  The SARS-CoV-2 RNA is generally detectable in upper respiratory specimens during the acute phase of infection. The lowest concentration of SARS-CoV-2 viral copies this assay can detect is 138 copies/mL. A negative result does not preclude SARS-Cov-2 infection and should not be used as the sole basis for treatment or other patient management decisions. A negative result may occur with  improper specimen collection/handling, submission of specimen other than nasopharyngeal swab, presence of viral mutation(s) within the areas targeted by this assay, and inadequate number of viral copies(<138 copies/mL). A negative result must be combined with clinical observations, patient history, and epidemiological information. The expected result is Negative.  Fact Sheet for Patients:  EntrepreneurPulse.com.au  Fact Sheet for Healthcare Providers:  IncredibleEmployment.be  This test is no t yet approved or cleared by the [place] FDA and  has been authorized for detection and/or diagnosis of SARS-CoV-2 by FDA under an Emergency Use Authorization (EUA). This EUA will remain  in effect (meaning this test can be used) for the duration of the COVID-19 declaration under Section 564(b)(1) of the Act, 21 U.S.C.section 360bbb-3(b)(1), unless the authorization is terminated  or revoked sooner.       Influenza A by PCR NEGATIVE NEGATIVE Final   Influenza B by PCR NEGATIVE NEGATIVE Final    Comment: (NOTE) The Xpert Xpress SARS-CoV-2/FLU/RSV plus  assay is intended as an aid in the diagnosis of influenza from Nasopharyngeal swab specimens and should not be used as a sole basis for treatment. Nasal washings and aspirates are unacceptable for Xpert Xpress SARS-CoV-2/FLU/RSV testing.  Fact Sheet for Patients: EntrepreneurPulse.com.au  Fact Sheet for Healthcare Providers: IncredibleEmployment.be  This test is not yet approved or cleared by the [place] FDA and has been authorized for detection and/or diagnosis of SARS-CoV-2 by FDA under an Emergency Use Authorization (EUA). This EUA will remain in effect (meaning this test can be used) for the duration of the COVID-19 declaration under Section 564(b)(1) of the Act, 21 U.S.C. section 360bbb-3(b)(1), unless the authorization is terminated or revoked.  Performed at [facility], [place] [address] [place], [place] [address]   Urine Culture     Status: Abnormal   Collection Time: 11/03/21  4:28 PM   Specimen: In/Out Cath Urine  Result Value Ref Range Status   Specimen Description   Final    IN/OUT CATH URINE Performed at [facility] [address], [place], [place] [address]    Special Requests   Final    NONE Performed at [facility], [place] [address], [place], [place] [address]    Culture MULTIPLE SPECIES PRESENT, SUGGEST RECOLLECTION (A)  Final   Report Status 11/05/2021 FINAL  Final  MRSA Next Gen by PCR, Nasal     Status: Abnormal   Collection Time: 11/03/21 10:00 PM   Specimen: Nasal Mucosa; Nasal Swab  Result Value Ref Range Status   MRSA by PCR Next Gen DETECTED (A) NOT DETECTED Final    Comment: CRITICAL RESULT CALLED TO, READ BACK BY AND VERIFIED WITH:  COURTNEY GRIFFIN RN 11/03/21 @ 1601 VS (NOTE) The GeneXpert MRSA Assay (FDA approved for NASAL specimens only), is one component of a comprehensive MRSA colonization surveillance program. It is not intended to diagnose  MRSA infection nor to guide or monitor treatment for MRSA  infections. Test performance is not FDA approved in patients less than 79 years old. Performed at [facility], [place] [address] [place], [place] [address]     Renal Function: Recent Labs    11/04/21 0235 11/05/21 0236 11/06/21 0812 11/07/21 0247 11/08/21 0449 11/08/21 1553 11/09/21 0446  CREATININE 4.83* 3.79* 2.88* 2.59* 2.46* 2.36* 2.18*   Estimated Creatinine Clearance: 24.3 mL/min (A) (by C-G formula based on SCr of 2.18 mg/dL (H)).  Radiologic Imaging: DG CHEST PORT 1 VIEW  Result Date: 11/08/2021 CLINICAL DATA:  New onset altered mental status. History of metastatic prostate cancer. Congestive heart failure. EXAM: PORTABLE CHEST 1 VIEW COMPARISON:  11/03/2021 FINDINGS: Shallow inspiration. Heart size is normal. Suggestion of patchy infiltration in the lungs, most prominent on the left. This could represent multifocal pneumonia, edema, or metastatic disease. No definite pleural effusions. Mediastinal contours appear intact. Diffuse bone sclerosis consistent with known metastatic prostate cancer. Colonic interposition under the right hemidiaphragm. IMPRESSION: Developing patchy infiltrates in the lungs. Diffuse bone sclerosis consistent with known metastatic prostate cancer. Electronically Signed   By: Lucienne Capers M.D.   On: 11/08/2021 19:02    I independently reviewed the above imaging studies.  Impression/Recommendation Metastatic prostate cancer: Continue ADT/enzalutamide.  Has scheduled follow up as outpatient in near future for labs. Bilateral ureteral obstruction/urinary retention/urethral erosion: He is currently scheduled for cystoscopy, bilateral ureteral stent change, and suprapubic tube placement on Thursday.  If he is felt to be medically stable for procedure per Dr. Florene Glen, will plan to proceed as scheduled.  Otherwise, will delay if needed.  I will touch base with patient's brother and  POA once final decision is made.  Dutch Gray 11/09/2021, 7:04 AM    Pryor Curia MD   CC: Dr. Fayrene Helper

## 2021-11-09 NOTE — Progress Notes (Signed)
Pharmacy Antibiotic Note  Anthony Dudley is a 87 y.o. male admitted on 11/03/2021 with  septic shock secondary to acute cystitis with microscopic hematuria and proctitis .  Today, cefepime & metronidazole discontinued and remains on vancomycin.  This evening,  Pharmacy has been consulted for Zosyn dosing.  Plan: Zosyn 3.375g IV q8h (4 hour infusion). Monitor clinical progress and renal function F/U C&S, abx deescalation / LOT   Height: 5\' 9"  (175.3 cm) Weight: 71.7 kg (158 lb 1.1 oz) IBW/kg (Calculated) : 70.7  Temp (24hrs), Avg:98.2 F (36.8 C), Min:98.1 F (36.7 C), Max:98.3 F (36.8 C)  Recent Labs  Lab 11/03/21 1630 11/03/21 1631 11/03/21 2229 11/04/21 0235 11/05/21 0236 11/05/21 1507 11/06/21 0812 11/07/21 0247 11/08/21 0449 11/08/21 1553 11/09/21 0446 11/09/21 1626  WBC  --    < > 11.7*   < > 9.3 9.1  --  7.9 9.2  --  8.8  --   CREATININE  --    < > 5.40*   5.25*   < > 3.79*  --    < > 2.59* 2.46* 2.36* 2.18* 2.14*  LATICACIDVEN 1.5  --  1.1  --   --   --   --   --   --   --   --   --   VANCORANDOM  --   --   --   --  12  --   --   --   --   --   --   --    < > = values in this interval not displayed.    Estimated Creatinine Clearance: 24.8 mL/min (A) (by C-G formula based on SCr of 2.14 mg/dL (H)).    Allergies  Allergen Reactions   Solanum Lycopersicum [Tomato]     Patient reports he is not allergic to tomato and he eats them all the time    Antimicrobials this admission: 12/21 cefepime >> 12/27 12/21 metronidazole >> 12/27 12/21 vancomycin >> 12/27 Zosyn  Microbiology results: 12/21 BCx: NGTD 12/21 UCx: multiple species present, suggest recollection  12/21 MRSA PCR: detected   Thank you for allowing pharmacy to be a part of this patients care.  Royetta Asal, PharmD, BCPS Clinical Pharmacist [place] Please utilize Amion for appropriate phone number to reach the unit pharmacist ([place]) 11/09/2021 5:32 PM

## 2021-11-09 NOTE — Progress Notes (Addendum)
PROGRESS NOTE    Anthony Dudley  XBW:[id number] DOB: [birth year] DOA: 11/03/2021 PCP: [name], MD  Chief Complaint  Patient presents with   Hypotension   Fever    Brief Narrative:  87 yo with CKD, HTN, HF, dementia, prostate cancer who was hospitalized on October with sepsis and AKI on CKD 2/2 obstruction related to metastatic prostate cancer.  He's being treated at this hospitalization for septic shock 2/2 UTI.  He was initially managed in the ICU with need for pressors.  He's been transferred to the hospitalist service on 12/25.  He's encephalopathic, currently covering for UTI and possible pneumonia.    See below for additional details    Assessment & Plan:   Principal Problem:   Septic shock due to urinary tract infection ([place]) Active Problems:   Goals of care, counseling/discussion   Hypernatremia   Acute metabolic encephalopathy   Abnormal CXR   Acute kidney injury superimposed on CKD [facility])   Prostate cancer metastatic to multiple sites [facility])   Acute urinary retention   Sinus tachycardia   Lower extremity edema   Anemia   Hypokalemia   Dysphagia   Essential hypertension   Legally blind   * Septic shock due to urinary tract infection ([place])- (present on admission) Presented to preop visit with urology, found to be febrile and tachycardic with urinary retention with >800 cc dark murky urine Given IM ceftriaxone and sent to ED Initially admitted to ICU with need for pressors -> weaned off  Vanc/cefepime/flagyl x1 on 12/21 Cefepime 12/21-12/26.  Transition to zosyn (12/27- ) with AMS, plan for at least 7 total days abx for UTI Urine cx with multiple species.  Per previous notes, prior cx with MDR proteus sensitive to cefepime (09/2019) Blood cx NG CT abd pelvis with mild bilateral hydro (decreased from prior) - bilateral ureteral stents  Goals of care, counseling/discussion Discussed code status with brother ben, he desires to continue full code.  Would be  interested in feeding tube if needed (previously discussed)  Acute metabolic encephalopathy 2/2 acute infection Delirium precautions Seems worse than his baseline  B12 wnl, VBG without hypercarbia, folate wnl, TSH elevated, follow free T4, follow head CT without acute abnormality Follow EEG Follow am cortisol Treat hypernatremia and infection NPO given mental status CXR with pneumonia, which could contribute  Hypernatremia- (present on admission) Continue hypotonic fluids, trend  Abnormal CXR CXR 12/26 with developing patchy infiltrates Cover for pneumonia esp given AMS, follow repeat films MRSA PCR 12/21 was positive  Sinus tachycardia Improved, follow  Acute urinary retention plan for suprapubic catheter outpatient with urology Urology c/s - potential cystoscopy, stent exchange, suprapubic tube placement Thursday pending stability  Prostate cancer metastatic to multiple sites [facility]) PSA 2,268 10/22 Follows with urology, extensive bone mets     Acute kidney injury superimposed on CKD ([place]) Baseline creatinine 2.2 Peaked at 6.08 here, improving, follow 2/2 sepsis S/p bilateral antegrade ureter stents on 10/18 by IR 2/2 obstructive uropathy (initially bilateral perc nephrostomy tubes placed 08/19/21)  Lower extremity edema LE [place] negative for DVT on L  Hypokalemia Replace and follow  Anemia Trend S/p 1 unit prbc Fluctuating, trend  Essential hypertension BP meds on hold, BP reasobale today (doesn't appear he's on many outpatient)  Dysphagia SLP eval, dysphagia 1 nectar thick - currently NPO  Legally blind Brother ordering glases   DVT prophylaxis: hearin Code Status: full Family Communication: ben, brother - called again 12/27, he'll call back Disposition:   Status is: Inpatient  Remains inpatient appropriate because: need for iv abx   Consultants:  pccm  Procedures:  none  Antimicrobials:  Anti-infectives (From admission, onward)    Start      Dose/Rate Route Frequency Ordered Stop   11/10/21 2200  vancomycin (VANCOREADY) IVPB 1250 mg/250 mL        1,250 mg 166.7 mL/hr over 90 Minutes Intravenous Every 48 hours 11/08/21 1934     11/09/21 1830  piperacillin-tazobactam (ZOSYN) IVPB 3.375 g        3.375 g 12.5 mL/hr over 240 Minutes Intravenous Every 8 hours 11/09/21 1730     11/08/21 2015  vancomycin (VANCOREADY) IVPB 1500 mg/300 mL        1,500 mg 150 mL/hr over 120 Minutes Intravenous  Once 11/08/21 1927 11/09/21 0700   11/08/21 2000  metroNIDAZOLE (FLAGYL) IVPB 500 mg  Status:  Discontinued        500 mg 100 mL/hr over 60 Minutes Intravenous Every 12 hours 11/08/21 1908 11/09/21 1719   11/05/21 1700  ceFEPIme (MAXIPIME) 2 g in sodium chloride 0.9 % 100 mL IVPB  Status:  Discontinued        2 g 200 mL/hr over 30 Minutes Intravenous Every 24 hours 11/05/21 1026 11/09/21 1718   11/04/21 1700  ceFEPIme (MAXIPIME) 1 g in sodium chloride 0.9 % 100 mL IVPB  Status:  Discontinued        1 g 200 mL/hr over 30 Minutes Intravenous Every 24 hours 11/03/21 2034 11/05/21 1026   11/04/21 1132  vancomycin variable dose per unstable renal function (pharmacist dosing)  Status:  Discontinued         Does not apply See admin instructions 11/04/21 1132 11/05/21 0954   11/03/21 1530  ceFEPIme (MAXIPIME) 2 g in sodium chloride 0.9 % 100 mL IVPB        2 g 200 mL/hr over 30 Minutes Intravenous  Once 11/03/21 1518 11/03/21 2000   11/03/21 1530  metroNIDAZOLE (FLAGYL) IVPB 500 mg        500 mg 100 mL/hr over 60 Minutes Intravenous  Once 11/03/21 1518 11/03/21 2000   11/03/21 1530  vancomycin (VANCOCIN) IVPB 1000 mg/200 mL premix        1,000 mg 200 mL/hr over 60 Minutes Intravenous  Once 11/03/21 1518 11/03/21 2000       Subjective: Remains confused and difficult to understand  Objective: Vitals:   11/09/21 0151 11/09/21 0500 11/09/21 0507 11/09/21 1440  BP: 117/62  116/61 (!) 102/53  Pulse: 93  88 89  Resp: 18  20 20   Temp: 98.2 F  (36.8 C)  98.1 F (36.7 C) 98.1 F (36.7 C)  TempSrc: Oral  Oral Oral  SpO2: 98%  98% 95%  Weight:  71.7 kg    Height:        Intake/Output Summary (Last 24 hours) at 11/09/2021 2012 Last data filed at 11/09/2021 1800 Gross per 24 hour  Intake 1706.6 ml  Output 2000 ml  Net -293.4 ml   Filed Weights   11/07/21 0444 11/08/21 0615 11/09/21 0500  Weight: 78.7 kg 72.8 kg 71.7 kg    Examination:  General: No acute distress. Cardiovascular: RRR Lungs: unlabored Abdomen: Soft, nontender, nondistended Neurological: encephalopathic, lethargic, responds with some simple answers, but difficult to understand, follows simple commands, wiggling toes, squeezing finger (teardrop pupil on R chronic) Skin: Warm and dry. No rashes or lesions. Extremities: No clubbing or cyanosis. No edema.  Data Reviewed: I have personally reviewed following labs and  imaging studies  CBC: Recent Labs  Lab 11/03/21 1631 11/03/21 2229 11/05/21 0236 11/05/21 1507 11/07/21 0247 11/08/21 0449 11/09/21 0446  WBC 12.0*   < > 9.3 9.1 7.9 9.2 8.8  NEUTROABS 10.1*  --   --   --   --  4.9 5.0  HGB 8.3*   < > 6.8* 8.1* 7.6* 9.3* 8.1*  HCT 26.9*   < > 21.9* 25.5* 24.0* 30.0* 25.9*  MCV 105.5*   < > 102.8* 98.8 100.0 101.7* 102.0*  PLT 290   < > 254 236 220 254 228   < > = values in this interval not displayed.    Basic Metabolic Panel: Recent Labs  Lab 11/04/21 0235 11/05/21 0236 11/07/21 0247 11/07/21 0923 11/08/21 0449 11/08/21 1553 11/09/21 0446 11/09/21 1626  NA 141   < > 145  --  151* 149* 149* 146*  K 3.7   < > 3.0* 3.8 3.5 3.0* 3.6 3.3*  CL 113*   < > 120*  --  123* 126* 123* 121*  CO2 19*   < > 20*  --  19* 20* 19* 19*  GLUCOSE 117*   < > 95  --  100* 101* 114* 96  BUN 102*   < > 41*  --  33* 30* 26* 24*  CREATININE 4.83*   < > 2.59*  --  2.46* 2.36* 2.18* 2.14*  CALCIUM 7.8*   < > 7.7*  --  8.3* 7.8* 8.0* 8.2*  MG 2.9*  --  2.2  --  2.2  --  2.0  --   PHOS 3.0  --   --   --  2.5  --   2.6  --    < > = values in this interval not displayed.    GFR: Estimated Creatinine Clearance: 24.8 mL/min (Boleslaw Borghi) (by C-G formula based on SCr of 2.14 mg/dL (H)).  Liver Function Tests: Recent Labs  Lab 11/03/21 1631 11/08/21 0449 11/09/21 0446  AST 13* 20 15  ALT 12 14 12   ALKPHOS 418* 295* 263*  BILITOT 0.9 0.7 0.5  PROT 7.3 6.4* 5.7*  ALBUMIN 2.8* 2.2* 1.9*    CBG: Recent Labs  Lab 11/07/21 0028 11/07/21 0340 11/07/21 0736 11/07/21 1113 11/07/21 1507  GLUCAP 89 87 83 82 77     Recent Results (from the past 240 hour(s))  Blood Culture (routine x 2)     Status: None   Collection Time: 11/03/21  3:55 PM   Specimen: BLOOD  Result Value Ref Range Status   Specimen Description   Final    BLOOD RIGHT ANTECUBITAL Performed at [facility], [place] [address], [place], [place] [address]    Special Requests   Final    BOTTLES DRAWN AEROBIC AND ANAEROBIC Blood Culture results may not be optimal due to an inadequate volume of blood received in culture bottles Performed at [place] [address], [place], [place] [address]    Culture   Final    NO GROWTH 5 DAYS Performed at [facility], [place] [address], [place], [place] [address]    Report Status 11/08/2021 FINAL  Final  Blood Culture (routine x 2)     Status: None   Collection Time: 11/03/21  4:07 PM   Specimen: BLOOD  Result Value Ref Range Status   Specimen Description   Final    BLOOD RIGHT ANTECUBITAL Performed at [place] [address] [place], [place] [address]  Special Requests   Final    Blood Culture results may not be optimal due to an inadequate volume of blood received in culture bottles BOTTLES DRAWN AEROBIC AND ANAEROBIC Performed at [facility], [place] [address] [place], [place] [address]    Culture   Final    NO GROWTH 5 DAYS Performed at [facility], [place] [address] [place], [place] [address]     Report Status 11/08/2021 FINAL  Final  Resp Panel by RT-PCR (Flu Santhosh Gulino&B, Covid) Nasopharyngeal Swab     Status: None   Collection Time: 11/03/21  4:27 PM   Specimen: Nasopharyngeal Swab; Nasopharyngeal(NP) swabs in vial transport medium  Result Value Ref Range Status   SARS Coronavirus 2 by RT PCR NEGATIVE NEGATIVE Final    Comment: (NOTE) SARS-CoV-2 target nucleic acids are NOT DETECTED.  The SARS-CoV-2 RNA is generally detectable in upper respiratory specimens during the acute phase of infection. The lowest concentration of SARS-CoV-2 viral copies this assay can detect is 138 copies/mL. Lisa Milian negative result does not preclude SARS-Cov-2 infection and should not be used as the sole basis for treatment or other patient management decisions. Ameilia Rattan negative result may occur with  improper specimen collection/handling, submission of specimen other than nasopharyngeal swab, presence of viral mutation(s) within the areas targeted by this assay, and inadequate number of viral copies(<138 copies/mL). Billiejean Schimek negative result must be combined with clinical observations, patient history, and epidemiological information. The expected result is Negative.  Fact Sheet for Patients:  EntrepreneurPulse.com.au  Fact Sheet for Healthcare Providers:  IncredibleEmployment.be  This test is no t yet approved or cleared by the [place] FDA and  has been authorized for detection and/or diagnosis of SARS-CoV-2 by FDA under an Emergency Use Authorization (EUA). This EUA will remain  in effect (meaning this test can be used) for the duration of the COVID-19 declaration under Section 564(b)(1) of the Act, 21 U.S.C.section 360bbb-3(b)(1), unless the authorization is terminated  or revoked sooner.       Influenza [place] by PCR NEGATIVE NEGATIVE Final   Influenza B by PCR NEGATIVE NEGATIVE Final    Comment: (NOTE) The Xpert Xpress SARS-CoV-2/FLU/RSV plus assay is intended as an aid in  the diagnosis of influenza from Nasopharyngeal swab specimens and should not be used as Jenniger Figiel sole basis for treatment. Nasal washings and aspirates are unacceptable for Xpert Xpress SARS-CoV-2/FLU/RSV testing.  Fact Sheet for Patients: EntrepreneurPulse.com.au  Fact Sheet for Healthcare Providers: IncredibleEmployment.be  This test is not yet approved or cleared by the [place] FDA and has been authorized for detection and/or diagnosis of SARS-CoV-2 by FDA under an Emergency Use Authorization (EUA). This EUA will remain in effect (meaning this test can be used) for the duration of the COVID-19 declaration under Section 564(b)(1) of the Act, 21 U.S.C. section 360bbb-3(b)(1), unless the authorization is terminated or revoked.  Performed at [facility], [place] [address] [place], [place] [address]   Urine Culture     Status: Abnormal   Collection Time: 11/03/21  4:28 PM   Specimen: In/Out Cath Urine  Result Value Ref Range Status   Specimen Description   Final    IN/OUT CATH URINE Performed at [place] [address], [place], [place] [address]    Special Requests   Final    NONE Performed at [facility], [place] [address], [place], [place] [address]    Culture MULTIPLE SPECIES PRESENT, SUGGEST RECOLLECTION (Josten Warmuth)  Final  Report Status 11/05/2021 FINAL  Final  MRSA Next Gen by PCR, Nasal     Status: Abnormal   Collection Time: 11/03/21 10:00 PM   Specimen: Nasal Mucosa; Nasal Swab  Result Value Ref Range Status   MRSA by PCR Next Gen DETECTED (Tyreke Kaeser) NOT DETECTED Final    Comment: CRITICAL RESULT CALLED TO, READ BACK BY AND VERIFIED WITH:  COURTNEY GRIFFIN RN 11/03/21 @ 1610 VS (NOTE) The GeneXpert MRSA Assay (FDA approved for NASAL specimens only), is one component of Wendee Hata comprehensive MRSA colonization surveillance program. It is not intended to diagnose MRSA infection nor to guide or  monitor treatment for MRSA infections. Test performance is not FDA approved in patients less than 15 years old. Performed at [facility], [place] [address], [place], [place] [address]          Radiology Studies: CT HEAD WO CONTRAST (5MM)  Result Date: 11/09/2021 CLINICAL DATA:  Delirium EXAM: CT HEAD WITHOUT CONTRAST TECHNIQUE: Contiguous axial images were obtained from the base of the skull through the vertex without intravenous contrast. COMPARISON:  No prior CT of the head was available for comparison. FINDINGS: Brain: No evidence of acute infarction, hemorrhage, cerebral edema, mass, mass effect, or midline shift. No hydrocephalus or extra-axial fluid collection. General cerebral atrophy, without lobar predominance, likely within normal limits for age. Vascular: No hyperdense vessel. Skull: Normal. Negative for fracture or focal lesion. Sinuses/Orbits: Mucosal thickening in the ethmoid air cells, with near-complete opacification of the right sphenoid sinus. Status post bilateral lens replacements. Other: The mastoid air cells are well aerated. IMPRESSION: IMPRESSION No acute intracranial process. Electronically Signed   By: Merilyn Baba M.D.   On: 11/09/2021 11:29   DG CHEST PORT 1 VIEW  Result Date: 11/08/2021 CLINICAL DATA:  New onset altered mental status. History of metastatic prostate cancer. Congestive heart failure. EXAM: PORTABLE CHEST 1 VIEW COMPARISON:  11/03/2021 FINDINGS: Shallow inspiration. Heart size is normal. Suggestion of patchy infiltration in the lungs, most prominent on the left. This could represent multifocal pneumonia, edema, or metastatic disease. No definite pleural effusions. Mediastinal contours appear intact. Diffuse bone sclerosis consistent with known metastatic prostate cancer. Colonic interposition under the right hemidiaphragm. IMPRESSION: Developing patchy infiltrates in the lungs. Diffuse bone sclerosis consistent with known metastatic  prostate cancer. Electronically Signed   By: Lucienne Capers M.D.   On: 11/08/2021 19:02        Scheduled Meds:  chlorhexidine  15 mL Mouth Rinse BID   Chlorhexidine Gluconate Cloth  6 each Topical Daily   heparin  5,000 Units Subcutaneous Q8H   mouth rinse  15 mL Mouth Rinse q12n4p   Continuous Infusions:  sodium chloride Stopped (11/04/21 0043)   dextrose 5 % with KCl 20 mEq / L Stopped (11/09/21 1800)   piperacillin-tazobactam (ZOSYN)  IV     [START ON 11/10/2021] vancomycin       LOS: 6 days    Time spent: over 30 min    Fayrene Helper, MD [facility]   To contact the attending provider between 7A-7P or the covering provider during after hours 7P-7A, please log into the web site [email or web address] and access using universal [place] password for that web site. If you do not have the password, please call the hospital operator.  11/09/2021, 8:12 PM

## 2021-11-09 NOTE — Assessment & Plan Note (Addendum)
CXR 12/26 with developing patchy infiltrates Completed antibiotic treatment course.

## 2021-11-09 NOTE — Progress Notes (Signed)
Upon turning patient, discovered that his IV had been leaking and his bed was saturated. IV pump stopped, linen and gown changed. Attempted to flush second IV site, flushed sluggishly but without signs of infiltration or leaking. Pump unable to flow via this IV site without constant alarm. IV team consult placed for new IV site due to difficult stick with edema and pt positioning (he withdraws from stimulation and holds his arms rigidly at his chest). Will await IV team.  Coolidge Breeze, RN 11/09/2021

## 2021-11-10 ENCOUNTER — Inpatient Hospital Stay (HOSPITAL_COMMUNITY)
Admit: 2021-11-10 | Discharge: 2021-11-10 | Disposition: A | Discharge status: Home / Self Care | Payer: Medicare Other | Attending: Family Medicine | Admitting: Family Medicine

## 2021-11-10 DIAGNOSIS — J690 Pneumonitis due to inhalation of food and vomit: Secondary | ICD-10-CM

## 2021-11-10 DIAGNOSIS — D539 Nutritional anemia, unspecified: Secondary | ICD-10-CM

## 2021-11-10 DIAGNOSIS — A419 Sepsis, unspecified organism: Secondary | ICD-10-CM | POA: Diagnosis not present

## 2021-11-10 DIAGNOSIS — N390 Urinary tract infection, site not specified: Secondary | ICD-10-CM | POA: Diagnosis not present

## 2021-11-10 DIAGNOSIS — R6521 Severe sepsis with septic shock: Secondary | ICD-10-CM | POA: Diagnosis not present

## 2021-11-10 DIAGNOSIS — N1832 Chronic kidney disease, stage 3b: Secondary | ICD-10-CM

## 2021-11-10 LAB — PHOSPHORUS: Phosphorus: 2.6 mg/dL (ref 2.5–4.6)

## 2021-11-10 LAB — CBC WITH DIFFERENTIAL/PLATELET
Abs Immature Granulocytes: 0.48 10*3/uL — ABNORMAL HIGH (ref 0.00–0.07)
Basophils Absolute: 0 10*3/uL (ref 0.0–0.1)
Basophils Relative: 0 %
Eosinophils Absolute: 0.4 10*3/uL (ref 0.0–0.5)
Eosinophils Relative: 4 %
HCT: 25.6 % — ABNORMAL LOW (ref 39.0–52.0)
Hemoglobin: 7.9 g/dL — ABNORMAL LOW (ref 13.0–17.0)
Immature Granulocytes: 6 %
Lymphocytes Relative: 27 %
Lymphs Abs: 2.3 10*3/uL (ref 0.7–4.0)
MCH: 31.6 pg (ref 26.0–34.0)
MCHC: 30.9 g/dL (ref 30.0–36.0)
MCV: 102.4 fL — ABNORMAL HIGH (ref 80.0–100.0)
Monocytes Absolute: 0.4 10*3/uL (ref 0.1–1.0)
Monocytes Relative: 5 %
Neutro Abs: 5 10*3/uL (ref 1.7–7.7)
Neutrophils Relative %: 58 %
Platelets: 236 10*3/uL (ref 150–400)
RBC: 2.5 MIL/uL — ABNORMAL LOW (ref 4.22–5.81)
RDW: 20.4 % — ABNORMAL HIGH (ref 11.5–15.5)
WBC: 8.6 10*3/uL (ref 4.0–10.5)
nRBC: 0 % (ref 0.0–0.2)

## 2021-11-10 LAB — COMPREHENSIVE METABOLIC PANEL
ALT: 11 U/L (ref 0–44)
AST: 16 U/L (ref 15–41)
Albumin: 1.8 g/dL — ABNORMAL LOW (ref 3.5–5.0)
Alkaline Phosphatase: 286 U/L — ABNORMAL HIGH (ref 38–126)
Anion gap: 5 (ref 5–15)
BUN: 22 mg/dL (ref 8–23)
CO2: 18 mmol/L — ABNORMAL LOW (ref 22–32)
Calcium: 7.8 mg/dL — ABNORMAL LOW (ref 8.9–10.3)
Chloride: 119 mmol/L — ABNORMAL HIGH (ref 98–111)
Creatinine, Ser: 1.99 mg/dL — ABNORMAL HIGH (ref 0.61–1.24)
GFR, Estimated: 32 mL/min — ABNORMAL LOW (ref >60)
Glucose, Bld: 126 mg/dL — ABNORMAL HIGH (ref 70–99)
Potassium: 3.3 mmol/L — ABNORMAL LOW (ref 3.5–5.1)
Sodium: 142 mmol/L (ref 135–145)
Total Bilirubin: 0.6 mg/dL (ref 0.3–1.2)
Total Protein: 5.6 g/dL — ABNORMAL LOW (ref 6.5–8.1)

## 2021-11-10 LAB — CORTISOL: Cortisol, Plasma: 7.5 ug/dL

## 2021-11-10 LAB — MAGNESIUM: Magnesium: 1.8 mg/dL (ref 1.7–2.4)

## 2021-11-10 MED ORDER — POTASSIUM CHLORIDE 10 MEQ/100ML IV SOLN
10.0000 meq | INTRAVENOUS | Status: AC
  Administered 2021-11-10 (×3): 10 meq via INTRAVENOUS
  Filled 2021-11-10 (×3): qty 100

## 2021-11-10 MED ORDER — DEXTROSE IN LACTATED RINGERS 5 % IV SOLN
INTRAVENOUS | Status: DC
Start: 2021-11-10 — End: 2021-11-11

## 2021-11-10 NOTE — Progress Notes (Addendum)
Progress Note   Patient: Anthony Dudley ZOX:[id number] DOB: January 13, 1933 DOA: 11/03/2021     7 DOS: the patient was seen and examined on 11/10/2021   Brief hospital course: 87 yo with CKD, HTN, HF, dementia, prostate cancer who was hospitalized on October with sepsis and AKI on CKD 2/2 obstruction related to metastatic prostate cancer.  He's being treated at this hospitalization for septic shock 2/2 UTI.  He was initially managed in the ICU with need for pressors.  He's been transferred to the hospitalist service on 12/25.  He's encephalopathic, currently covering for UTI and possible pneumonia.    See below for additional details  Assessment and Plan * Septic shock due to urinary tract infection ([place])- (present on admission) Presented to preop visit with urology, found to be febrile and tachycardic with urinary retention with >800 cc dark murky urine Given IM ceftriaxone and sent to ED Initially admitted to ICU with need for pressors -> weaned off  Vanc/cefepime/flagyl x1 on 12/21 Cefepime 12/21-12/26.  Transition to zosyn (12/27- ) with AMS, plan for at least 7 total days abx for UTI Urine cx with multiple species.  Per previous notes, prior cx with MDR proteus sensitive to cefepime (09/2019) Blood cx NG CT abd pelvis with mild bilateral hydro (decreased from prior) - bilateral ureteral stents Plan is for cystoscopy, stent exchange as well as suprapubic catheter placement. Patient more remains at high risk overall does not have any prohibitive risk to go through the procedure.  Goals of care, counseling/discussion Discussed code status with brother ben, he desires to continue full code.  Would be interested in feeding tube if needed (previously discussed)  Aspiration pneumonia ([place]) CXR 12/26 with developing patchy infiltrates Cover for pneumonia esp given AMS, follow repeat films MRSA PCR 12/21 was positive although I do not suspect the patient is suffering from MRSA pneumonia and  therefore will discontinue vancomycin. Continue Zosyn.  Potential transition to Unasyn.   Sinus tachycardia Improved, follow  Legally blind Brother ordering glases  Hypokalemia Replace and follow  Acute urinary retention plan for suprapubic catheter outpatient with urology Urology c/s - potential cystoscopy, stent exchange, suprapubic tube placement Thursday pending stability  Essential hypertension BP meds on hold, BP reasobale today (doesn't appear he's on many outpatient)  Lower extremity edema LE [place] negative for DVT on L  Acute metabolic encephalopathy 2/2 acute infection Delirium precautions Seems worse than his baseline  B12 wnl, VBG without hypercarbia, folate wnl, TSH mildly elevated, free T4 also mildly elevated. CT head unremarkable. EEG performed currently pending. Lower cortisol level although do not suspect the patient has adrenal insufficiency. Treat hypernatremia and infection NPO given mental status CXR with pneumonia, which could contribute  Hypernatremia- (present on admission) Corrected.  We will switch to D5 LR.  Monitor.  Anemia Trend S/p 1 unit prbc Fluctuating, trend  Dysphagia SLP eval, dysphagia 1 nectar thick - currently NPO  Prostate cancer metastatic to multiple sites [facility]) PSA 2,268 10/22 Follows with urology, extensive bone mets     Acute renal failure superimposed on stage 3b chronic kidney disease ([place]) Baseline creatinine 2.2 Peaked at 6.08 here, improving, follow 2/2 sepsis S/p bilateral antegrade ureter stents on 10/18 by IR 2/2 obstructive uropathy (initially bilateral perc nephrostomy tubes placed 08/19/21)     Subjective: Able to follow command and retrolingual stimuli but unable to communicate her own engage in conversation.  No nausea no vomiting.  No acute events overnight.  Objective Vital signs were reviewed and unremarkable. General:  Appear in mild distress, no Rash; Oral Mucosa dry, moist. no Abnormal Neck  Mass Or lumps, Conjunctiva normal  Cardiovascular: S1 and S2 Present, no Murmur, Respiratory: good respiratory effort, Bilateral Air entry present and CTA, no Crackles, no wheezes Abdomen: Bowel Sound present, Soft and no tenderness Extremities: no Pedal edema Neurology: alert and nonverbal, not oriented to time, place, and person affect appropriate. no new focal deficit withdraws to pain stimuli bilaterally. Gait not checked due to patient safety concerns   Data Reviewed: Hypokalemia present.  Family Communication: None at bedside.  Disposition: Status is: Inpatient  Remains inpatient appropriate because: Requirement for suprapubic catheter and cystoscopy for source control in the setting of UTI.    Time spent: 35 minutes  Author: Berle Mull 11/10/2021 8:27 PM  For on call review [email or web address].

## 2021-11-10 NOTE — Progress Notes (Signed)
Pharmacy Antibiotic Note  Anthony Dudley is a 87 y.o. male admitted on 11/03/2021 with  septic shock secondary to acute cystitis with microscopic hematuria and proctitis . Also with concern for PNA. Pharmacy consulted for Zosyn.   Day #8 of abx for urinary coverage. Cystoscopy and stent exchange on 12/29 as long as medically stable. Day #3 of broadened abx for possible PNA. CXR shows opacities. Afebrile, WBC wnl. No worsening respiratory symptoms.  Plan: Zosyn 3.375g IV q8h (4 hour infusion). Stop vancomycin Monitor clinical progress and renal function F/U LOT after urology procedure  Height: 5\' 9"  (175.3 cm) Weight: 74.1 kg (163 lb 5.8 oz) IBW/kg (Calculated) : 70.7  Temp (24hrs), Avg:98.4 F (36.9 C), Min:98 F (36.7 C), Max:99.1 F (37.3 C)  Recent Labs  Lab 11/03/21 1630 11/03/21 1631 11/03/21 2229 11/04/21 0235 11/05/21 0236 11/05/21 1507 11/06/21 0812 11/07/21 0247 11/08/21 0449 11/08/21 1553 11/09/21 0446 11/09/21 1626 11/10/21 0445  WBC  --    < > 11.7*   < > 9.3 9.1  --  7.9 9.2  --  8.8  --  8.6  CREATININE  --    < > 5.40*   5.25*   < > 3.79*  --    < > 2.59* 2.46* 2.36* 2.18* 2.14* 1.99*  LATICACIDVEN 1.5  --  1.1  --   --   --   --   --   --   --   --   --   --   VANCORANDOM  --   --   --   --  12  --   --   --   --   --   --   --   --    < > = values in this interval not displayed.     Estimated Creatinine Clearance: 26.6 mL/min (A) (by C-G formula based on SCr of 1.99 mg/dL (H)).    Allergies  Allergen Reactions   Solanum Lycopersicum [Tomato]     Patient reports he is not allergic to tomato and he eats them all the time   Antimicrobials this admission: 12/21 cefepime >> 12/27 12/21 metronidazole >> 12/27 12/21 vancomycin >> 12/28 12/27 Zosyn >>  Microbiology results: 12/21 BCx: NGTD 12/21 UCx: multiple species present, suggest recollection  12/21 MRSA PCR: detected  Thank you for allowing pharmacy to be a part of this patients  care.  Elenor Quinones, PharmD, BCPS, BCIDP Clinical Pharmacist 11/10/2021 11:50 AM

## 2021-11-10 NOTE — Evaluation (Signed)
Physical Therapy Evaluation-1x Patient Details Name: Anthony Dudley MRN: [id number] DOB: Aug 29, 1933 Today's Date: 11/10/2021  History of Present Illness  87 yo male admitted with UTI, septic shock. Hx of dementia, CKD, CHF, prostate ca.  Clinical Impression  Bed level eval only. Pt was unable to participate. He did not follow any commands. Unable to range UEs or LEs (arms braced against torso, resistance bil LEs). No family present during session. Pt is not appropriate for PT services. Will sign off.      Recommendations for follow up therapy are one component of a multi-disciplinary discharge planning process, led by the attending physician.  Recommendations may be updated based on patient status, additional functional criteria and insurance authorization.  Follow Up Recommendations No PT follow up    Assistance Recommended at Discharge Frequent or constant Supervision/Assistance  Functional Status Assessment    Equipment Recommendations       Recommendations for Other Services       Precautions / Restrictions Precautions Precautions: Fall      Mobility  Bed Mobility               General bed mobility comments: NT-unable to assess 2* cognition. Not following commands witih bil UEs braced against torso-possible contractures vs pt resistive?    Transfers                        Ambulation/Gait                  Stairs            Wheelchair Mobility    Modified Rankin (Stroke Patients Only)       Balance                                             Pertinent Vitals/Pain Pain Assessment: Faces Faces Pain Scale: No hurt    Home Living Family/patient expects to be discharged to:: Unsure                        Prior Function                       Hand Dominance        Extremity/Trunk Assessment   Upper Extremity Assessment Upper Extremity Assessment: Difficult to assess due to impaired  cognition    Lower Extremity Assessment Lower Extremity Assessment: Difficult to assess due to impaired cognition       Communication      Cognition Arousal/Alertness: Awake/alert   Overall Cognitive Status: Difficult to assess                                          General Comments      Exercises     Assessment/Plan    PT Assessment Patient does not need any further PT services  PT Problem List         PT Treatment Interventions      PT Goals (Current goals can be found in the Care Plan section)  Acute Rehab PT Goals Patient Stated Goal: pt unable PT Goal Formulation: Patient unable to participate in goal setting    Frequency     Barriers to discharge  Co-evaluation               AM-PAC PT "6 Clicks" Mobility  Outcome Measure Help needed turning from your back to your side while in a flat bed without using bedrails?: Total Help needed moving from lying on your back to sitting on the side of a flat bed without using bedrails?: Total Help needed moving to and from a bed to a chair (including a wheelchair)?: Total Help needed standing up from a chair using your arms (e.g., wheelchair or bedside chair)?: Total Help needed to walk in hospital room?: Total Help needed climbing 3-5 steps with a railing? : Total 6 Click Score: 6    End of Session     Patient left: in bed;with call bell/phone within reach;with bed alarm set        Time: 1218-1226 PT Time Calculation (min) (ACUTE ONLY): 8 min   Charges:   PT Evaluation $PT Eval Low Complexity: [place], PT Acute Rehabilitation  Office: [phone] Pager: [phone]

## 2021-11-10 NOTE — Progress Notes (Signed)
Patient ID: Anthony Dudley, male   DOB: 12-Apr-1933, 87 y.o.   MRN: [id number]     Subjective: Pt stable without new changes.  Able to slightly verbalize response (though incoherent) to my questions which is consistent with baseline status.  Objective: Vital signs in last 24 hours: Temp:  [98 F (36.7 C)-99.1 F (37.3 C)] 98 F (36.7 C) (12/28 0752) Pulse Rate:  [71-131] 91 (12/28 0752) Resp:  [16-20] 20 (12/28 0752) BP: (95-114)/(53-62) 114/62 (12/28 0752) SpO2:  [95 %-99 %] 99 % (12/28 0752) Weight:  [74.1 kg] 74.1 kg (12/28 0500)  Intake/Output from previous day: 12/27 0701 - 12/28 0700 In: 1199.9 [P.O.:20; I.V.:1129.9; IV Piggyback:50] Out: 2650 [Urine:2650] Intake/Output this shift: No intake/output data recorded.  Physical Exam:  GU: Urine draining clearly from Foley  Lab Results: Recent Labs    11/08/21 0449 11/09/21 0446 11/10/21 0445  HGB 9.3* 8.1* 7.9*  HCT 30.0* 25.9* 25.6*   CBC Latest Ref Rng & Units 11/10/2021 11/09/2021 11/08/2021  WBC 4.0 - 10.5 K/uL 8.6 8.8 9.2  Hemoglobin 13.0 - 17.0 g/dL 7.9(L) 8.1(L) 9.3(L)  Hematocrit 39.0 - 52.0 % 25.6(L) 25.9(L) 30.0(L)  Platelets 150 - 400 K/uL 236 228 254     BMET Recent Labs    11/09/21 1626 11/10/21 0445  NA 146* 142  K 3.3* 3.3*  CL 121* 119*  CO2 19* 18*  GLUCOSE 96 126*  BUN 24* 22  CREATININE 2.14* 1.99*  CALCIUM 8.2* 7.8*     Studies/Results: CT HEAD WO CONTRAST (5MM)  Result Date: 11/09/2021 CLINICAL DATA:  Delirium EXAM: CT HEAD WITHOUT CONTRAST TECHNIQUE: Contiguous axial images were obtained from the base of the skull through the vertex without intravenous contrast. COMPARISON:  No prior CT of the head was available for comparison. FINDINGS: Brain: No evidence of acute infarction, hemorrhage, cerebral edema, mass, mass effect, or midline shift. No hydrocephalus or extra-axial fluid collection. General cerebral atrophy, without lobar predominance, likely within normal limits for age.  Vascular: No hyperdense vessel. Skull: Normal. Negative for fracture or focal lesion. Sinuses/Orbits: Mucosal thickening in the ethmoid air cells, with near-complete opacification of the right sphenoid sinus. Status post bilateral lens replacements. Other: The mastoid air cells are well aerated. IMPRESSION: IMPRESSION No acute intracranial process. Electronically Signed   By: Merilyn Baba M.D.   On: 11/09/2021 11:29   DG CHEST PORT 1 VIEW  Result Date: 11/09/2021 CLINICAL DATA:  Infiltrates. EXAM: PORTABLE CHEST 1 VIEW COMPARISON:  November 08, 2021. FINDINGS: The heart size and mediastinal contours are within normal limits. Right lung is clear. Stable left lung opacities are noted concerning for pneumonia. Stable diffuse osseous sclerosis is noted. IMPRESSION: Stable left lung opacities are noted concerning for pneumonia. Electronically Signed   By: Marijo Conception M.D.   On: 11/09/2021 20:40   DG CHEST PORT 1 VIEW  Result Date: 11/08/2021 CLINICAL DATA:  New onset altered mental status. History of metastatic prostate cancer. Congestive heart failure. EXAM: PORTABLE CHEST 1 VIEW COMPARISON:  11/03/2021 FINDINGS: Shallow inspiration. Heart size is normal. Suggestion of patchy infiltration in the lungs, most prominent on the left. This could represent multifocal pneumonia, edema, or metastatic disease. No definite pleural effusions. Mediastinal contours appear intact. Diffuse bone sclerosis consistent with known metastatic prostate cancer. Colonic interposition under the right hemidiaphragm. IMPRESSION: Developing patchy infiltrates in the lungs. Diffuse bone sclerosis consistent with known metastatic prostate cancer. Electronically Signed   By: Lucienne Capers M.D.   On: 11/08/2021 19:02  Assessment/Plan: 1) Bilateral ureteral obstruction/retention:  Felt to be medically stable per primary team to proceed with procedure tomorrow. Cystoscopy, bilateral ureteral stent change, and SP tube placement.   Continuing on broad spectrum antibiotic therapy. 2) Metastatic prostate cancer: Continue ADT/enzalutamide and will keep outpatient follow up with me as scheduled for ongoing treatment.   LOS: 7 days   Dutch Gray 11/10/2021, 12:46 PM

## 2021-11-10 NOTE — Progress Notes (Signed)
EEG complete - results pending 

## 2021-11-11 ENCOUNTER — Inpatient Hospital Stay (HOSPITAL_COMMUNITY): Payer: Medicare Other | Admitting: Physician Assistant

## 2021-11-11 ENCOUNTER — Encounter (HOSPITAL_COMMUNITY): Payer: Self-pay | Admitting: Internal Medicine

## 2021-11-11 ENCOUNTER — Encounter (HOSPITAL_COMMUNITY)
Admission: EM | Disposition: A | Discharge status: Skilled Nursing Facility | Payer: Self-pay | Source: Ambulatory Visit | Attending: Internal Medicine

## 2021-11-11 ENCOUNTER — Inpatient Hospital Stay (HOSPITAL_COMMUNITY): Payer: Medicare Other

## 2021-11-11 ENCOUNTER — Ambulatory Visit (HOSPITAL_COMMUNITY): Admission: RE | Admit: 2021-11-11 | Payer: Medicare Other | Source: Home / Self Care | Admitting: Urology

## 2021-11-11 DIAGNOSIS — E8729 Other acidosis: Secondary | ICD-10-CM | POA: Diagnosis present

## 2021-11-11 DIAGNOSIS — N390 Urinary tract infection, site not specified: Secondary | ICD-10-CM | POA: Diagnosis not present

## 2021-11-11 DIAGNOSIS — E8809 Other disorders of plasma-protein metabolism, not elsewhere classified: Secondary | ICD-10-CM | POA: Diagnosis present

## 2021-11-11 DIAGNOSIS — Z01818 Encounter for other preprocedural examination: Secondary | ICD-10-CM

## 2021-11-11 DIAGNOSIS — R6521 Severe sepsis with septic shock: Secondary | ICD-10-CM | POA: Diagnosis not present

## 2021-11-11 DIAGNOSIS — J690 Pneumonitis due to inhalation of food and vomit: Secondary | ICD-10-CM | POA: Diagnosis not present

## 2021-11-11 DIAGNOSIS — G9341 Metabolic encephalopathy: Secondary | ICD-10-CM | POA: Diagnosis not present

## 2021-11-11 DIAGNOSIS — A419 Sepsis, unspecified organism: Secondary | ICD-10-CM | POA: Diagnosis not present

## 2021-11-11 DIAGNOSIS — D638 Anemia in other chronic diseases classified elsewhere: Secondary | ICD-10-CM

## 2021-11-11 HISTORY — DX: Hyperosmolality and hypernatremia: E87.0

## 2021-11-11 HISTORY — DX: Unspecified protein-calorie malnutrition: E46

## 2021-11-11 HISTORY — DX: Presence of other specified devices: Z97.8

## 2021-11-11 HISTORY — DX: Heart failure, unspecified: I50.9

## 2021-11-11 HISTORY — DX: Chronic kidney disease, unspecified: N18.9

## 2021-11-11 HISTORY — DX: Supraventricular tachycardia: I47.1

## 2021-11-11 HISTORY — DX: Obstructive and reflux uropathy, unspecified: N13.9

## 2021-11-11 HISTORY — DX: Other malaise: R53.81

## 2021-11-11 HISTORY — DX: Malignant (primary) neoplasm, unspecified: C80.1

## 2021-11-11 HISTORY — PX: INSERTION OF SUPRAPUBIC CATHETER: SHX5870

## 2021-11-11 HISTORY — DX: Hypo-osmolality and hyponatremia: E87.1

## 2021-11-11 HISTORY — DX: Unspecified dementia, unspecified severity, without behavioral disturbance, psychotic disturbance, mood disturbance, and anxiety: F03.90

## 2021-11-11 HISTORY — DX: Urinary tract infection, site not specified: N39.0

## 2021-11-11 HISTORY — DX: Supraventricular tachycardia, unspecified: I47.10

## 2021-11-11 HISTORY — DX: Severe sepsis with septic shock: R65.21

## 2021-11-11 HISTORY — DX: Dysphagia, unspecified: R13.10

## 2021-11-11 HISTORY — DX: Other specified disorders of urethra: N36.8

## 2021-11-11 HISTORY — DX: Hematuria, unspecified: R31.9

## 2021-11-11 HISTORY — DX: Gastro-esophageal reflux disease without esophagitis: K21.9

## 2021-11-11 HISTORY — DX: Anxiety disorder, unspecified: F41.9

## 2021-11-11 HISTORY — DX: Depression, unspecified: F32.A

## 2021-11-11 HISTORY — DX: Adult failure to thrive: R62.7

## 2021-11-11 HISTORY — PX: CYSTOSCOPY W/ URETERAL STENT PLACEMENT: SHX1429

## 2021-11-11 HISTORY — DX: Anemia, unspecified: D64.9

## 2021-11-11 LAB — CBC WITH DIFFERENTIAL/PLATELET
Abs Immature Granulocytes: 0.25 10*3/uL — ABNORMAL HIGH (ref 0.00–0.07)
Basophils Absolute: 0 10*3/uL (ref 0.0–0.1)
Basophils Relative: 0 %
Eosinophils Absolute: 0.4 10*3/uL (ref 0.0–0.5)
Eosinophils Relative: 4 %
HCT: 27.2 % — ABNORMAL LOW (ref 39.0–52.0)
Hemoglobin: 8.5 g/dL — ABNORMAL LOW (ref 13.0–17.0)
Immature Granulocytes: 3 %
Lymphocytes Relative: 35 %
Lymphs Abs: 3.4 10*3/uL (ref 0.7–4.0)
MCH: 31.7 pg (ref 26.0–34.0)
MCHC: 31.3 g/dL (ref 30.0–36.0)
MCV: 101.5 fL — ABNORMAL HIGH (ref 80.0–100.0)
Monocytes Absolute: 0.5 10*3/uL (ref 0.1–1.0)
Monocytes Relative: 5 %
Neutro Abs: 5.1 10*3/uL (ref 1.7–7.7)
Neutrophils Relative %: 53 %
Platelets: 245 10*3/uL (ref 150–400)
RBC: 2.68 MIL/uL — ABNORMAL LOW (ref 4.22–5.81)
RDW: 20.2 % — ABNORMAL HIGH (ref 11.5–15.5)
WBC: 9.6 10*3/uL (ref 4.0–10.5)
nRBC: 0 % (ref 0.0–0.2)

## 2021-11-11 LAB — COMPREHENSIVE METABOLIC PANEL
ALT: 13 U/L (ref 0–44)
AST: 20 U/L (ref 15–41)
Albumin: 1.9 g/dL — ABNORMAL LOW (ref 3.5–5.0)
Alkaline Phosphatase: 330 U/L — ABNORMAL HIGH (ref 38–126)
Anion gap: 6 (ref 5–15)
BUN: 19 mg/dL (ref 8–23)
CO2: 18 mmol/L — ABNORMAL LOW (ref 22–32)
Calcium: 7.9 mg/dL — ABNORMAL LOW (ref 8.9–10.3)
Chloride: 120 mmol/L — ABNORMAL HIGH (ref 98–111)
Creatinine, Ser: 2.15 mg/dL — ABNORMAL HIGH (ref 0.61–1.24)
GFR, Estimated: 29 mL/min — ABNORMAL LOW (ref >60)
Glucose, Bld: 107 mg/dL — ABNORMAL HIGH (ref 70–99)
Potassium: 3.7 mmol/L (ref 3.5–5.1)
Sodium: 144 mmol/L (ref 135–145)
Total Bilirubin: 0.7 mg/dL (ref 0.3–1.2)
Total Protein: 5.7 g/dL — ABNORMAL LOW (ref 6.5–8.1)

## 2021-11-11 SURGERY — CYSTOSCOPY, FLEXIBLE, WITH STENT REPLACEMENT
Anesthesia: General

## 2021-11-11 MED ORDER — FENTANYL CITRATE PF 50 MCG/ML IJ SOSY
PREFILLED_SYRINGE | INTRAMUSCULAR | Status: AC
  Filled 2021-11-11: qty 1

## 2021-11-11 MED ORDER — ONDANSETRON HCL 4 MG/2ML IJ SOLN
INTRAMUSCULAR | Status: AC
  Filled 2021-11-11: qty 2

## 2021-11-11 MED ORDER — FENTANYL CITRATE PF 50 MCG/ML IJ SOSY
25.0000 ug | PREFILLED_SYRINGE | INTRAMUSCULAR | Status: DC | PRN
  Administered 2021-11-11: 11:00:00 25 ug via INTRAVENOUS

## 2021-11-11 MED ORDER — BUPIVACAINE HCL 0.25 % IJ SOLN
INTRAMUSCULAR | Status: DC | PRN
  Administered 2021-11-11: 10 mL

## 2021-11-11 MED ORDER — DEXAMETHASONE SODIUM PHOSPHATE 10 MG/ML IJ SOLN
INTRAMUSCULAR | Status: AC
  Filled 2021-11-11: qty 1

## 2021-11-11 MED ORDER — LACTATED RINGERS IV SOLN: INTRAVENOUS | Status: DC

## 2021-11-11 MED ORDER — KCL IN DEXTROSE-NACL 20-5-0.45 MEQ/L-%-% IV SOLN
INTRAVENOUS | Status: DC
  Filled 2021-11-11 (×3): qty 1000

## 2021-11-11 MED ORDER — PHENYLEPHRINE 40 MCG/ML (10ML) SYRINGE FOR IV PUSH (FOR BLOOD PRESSURE SUPPORT)
PREFILLED_SYRINGE | INTRAVENOUS | Status: DC | PRN
  Administered 2021-11-11: 80 ug via INTRAVENOUS

## 2021-11-11 MED ORDER — KETAMINE HCL 10 MG/ML IJ SOLN
INTRAMUSCULAR | Status: DC | PRN
  Administered 2021-11-11: 10 mg via INTRAVENOUS
  Administered 2021-11-11: 20 mg via INTRAVENOUS
  Administered 2021-11-11: 10 mg via INTRAVENOUS
  Administered 2021-11-11 (×3): 20 mg via INTRAVENOUS

## 2021-11-11 MED ORDER — PHENYLEPHRINE 40 MCG/ML (10ML) SYRINGE FOR IV PUSH (FOR BLOOD PRESSURE SUPPORT)
PREFILLED_SYRINGE | INTRAVENOUS | Status: AC
  Filled 2021-11-11: qty 10

## 2021-11-11 MED ORDER — LIDOCAINE HCL (PF) 2 % IJ SOLN
INTRAMUSCULAR | Status: AC
  Filled 2021-11-11: qty 5

## 2021-11-11 MED ORDER — FENTANYL CITRATE (PF) 100 MCG/2ML IJ SOLN
INTRAMUSCULAR | Status: AC
  Filled 2021-11-11: qty 2

## 2021-11-11 MED ORDER — CHLORHEXIDINE GLUCONATE 0.12 % MT SOLN: 15.0000 mL | Freq: Once | OROMUCOSAL | Status: AC

## 2021-11-11 MED ORDER — KETAMINE HCL 50 MG/5ML IJ SOSY
PREFILLED_SYRINGE | INTRAMUSCULAR | Status: AC
  Filled 2021-11-11: qty 5

## 2021-11-11 MED ORDER — PROPOFOL 10 MG/ML IV BOLUS
INTRAVENOUS | Status: AC
  Filled 2021-11-11: qty 20

## 2021-11-11 MED ORDER — MIDAZOLAM HCL 2 MG/2ML IJ SOLN
INTRAMUSCULAR | Status: AC
  Filled 2021-11-11: qty 2

## 2021-11-11 MED ORDER — STERILE WATER FOR IRRIGATION IR SOLN
Status: DC | PRN
  Administered 2021-11-11: 1000 mL

## 2021-11-11 MED ORDER — ONDANSETRON HCL 4 MG/2ML IJ SOLN: 4.0000 mg | Freq: Once | INTRAMUSCULAR | Status: DC | PRN

## 2021-11-11 MED ORDER — ONDANSETRON HCL 4 MG/2ML IJ SOLN
INTRAMUSCULAR | Status: DC | PRN
  Administered 2021-11-11: 4 mg via INTRAVENOUS

## 2021-11-11 MED ORDER — ORAL CARE MOUTH RINSE
15.0000 mL | Freq: Once | OROMUCOSAL | Status: AC
  Administered 2021-11-11: 09:00:00 15 mL via OROMUCOSAL

## 2021-11-11 MED ORDER — DEXAMETHASONE SODIUM PHOSPHATE 10 MG/ML IJ SOLN
INTRAMUSCULAR | Status: DC | PRN
  Administered 2021-11-11: 10 mg via INTRAVENOUS

## 2021-11-11 MED ORDER — BUPIVACAINE HCL 0.25 % IJ SOLN
INTRAMUSCULAR | Status: AC
  Filled 2021-11-11: qty 1

## 2021-11-11 MED ORDER — MIDAZOLAM HCL 5 MG/5ML IJ SOLN
INTRAMUSCULAR | Status: DC | PRN
  Administered 2021-11-11 (×2): .5 mg via INTRAVENOUS
  Administered 2021-11-11: 1 mg via INTRAVENOUS

## 2021-11-11 MED ORDER — SODIUM CHLORIDE 0.9 % IV SOLN
2.0000 g | INTRAVENOUS | Status: AC
  Administered 2021-11-11: 10:00:00 2 g via INTRAVENOUS
  Filled 2021-11-11: qty 20

## 2021-11-11 MED ORDER — LIDOCAINE HCL URETHRAL/MUCOSAL 2 % EX GEL
CUTANEOUS | Status: AC
  Filled 2021-11-11: qty 30

## 2021-11-11 MED ORDER — ACETAMINOPHEN 10 MG/ML IV SOLN: 1000.0000 mg | Freq: Once | INTRAVENOUS | Status: DC | PRN

## 2021-11-11 SURGICAL SUPPLY — 36 items
BAG COUNTER SPONGE SURGICOUNT (BAG) IMPLANT
BAG URINE DRAIN 2000ML AR STRL (UROLOGICAL SUPPLIES) ×3 IMPLANT
BAG URINE LEG 500ML (DRAIN) ×2 IMPLANT
BAG URO CATCHER STRL LF (MISCELLANEOUS) ×3 IMPLANT
BLADE SURG 15 STRL LF DISP TIS (BLADE) ×2 IMPLANT
BLADE SURG 15 STRL SS (BLADE) ×1
CATH FOLEY 2WAY SLVR  5CC 20FR (CATHETERS) ×1
CATH FOLEY 2WAY SLVR 5CC 20FR (CATHETERS) IMPLANT
CATH URETL OPEN END 6FR 70 (CATHETERS) IMPLANT
CLOTH BEACON ORANGE TIMEOUT ST (SAFETY) ×3 IMPLANT
COUNTER NEEDLE 20 DBL MAG RED (NEEDLE) ×3 IMPLANT
COVER SURGICAL LIGHT HANDLE (MISCELLANEOUS) ×3 IMPLANT
ELECT REM PT RETURN 15FT ADLT (MISCELLANEOUS) ×3 IMPLANT
GAUZE 4X4 16PLY ~~LOC~~+RFID DBL (SPONGE) ×3 IMPLANT
GAUZE SPONGE 4X4 12PLY STRL (GAUZE/BANDAGES/DRESSINGS) ×1 IMPLANT
GLOVE SURG ENC TEXT LTX SZ7.5 (GLOVE) ×3 IMPLANT
GOWN STRL REUS W/TWL LRG LVL3 (GOWN DISPOSABLE) ×6 IMPLANT
GOWN STRL REUS W/TWL XL LVL3 (GOWN DISPOSABLE) ×6 IMPLANT
GUIDEWIRE STR DUAL SENSOR (WIRE) ×3 IMPLANT
GUIDEWIRE ZIPWRE .038 STRAIGHT (WIRE) IMPLANT
KIT TURNOVER KIT A (KITS) IMPLANT
MANIFOLD NEPTUNE II (INSTRUMENTS) ×3 IMPLANT
NEEDLE HYPO 22GX1.5 SAFETY (NEEDLE) ×1 IMPLANT
NS IRRIG 1000ML POUR BTL (IV SOLUTION) ×2 IMPLANT
PACK CYSTO (CUSTOM PROCEDURE TRAY) ×3 IMPLANT
PLUG CATH AND CAP STER (CATHETERS) IMPLANT
STENT CONTOUR 7FRX24X.038 (STENTS) ×2 IMPLANT
SUT SILK 0 SH 30 (SUTURE) ×1 IMPLANT
SUT SILK 2 0 SH (SUTURE) ×3 IMPLANT
SYR 10ML LL (SYRINGE) ×3 IMPLANT
SYR 20ML LL LF (SYRINGE) ×3 IMPLANT
TAPE CLOTH SURG 4X10 WHT LF (GAUZE/BANDAGES/DRESSINGS) ×1 IMPLANT
TOWEL OR 17X26 10 PK STRL BLUE (TOWEL DISPOSABLE) ×3 IMPLANT
TUBING CONNECTING 10 (TUBING) ×3 IMPLANT
TUBING UROLOGY SET (TUBING) IMPLANT
WATER STERILE IRR 3000ML UROMA (IV SOLUTION) ×2 IMPLANT

## 2021-11-11 NOTE — Anesthesia Preprocedure Evaluation (Addendum)
Anesthesia Evaluation  Patient identified by MRN, date of birth, ID band Patient confused  General Assessment Comment:Non verbal  Reviewed: Allergy & Precautions, NPO status , Patient's Chart, lab work & pertinent test results  Airway Mallampati: II  TM Distance: >3 FB Neck ROM: Full    Dental no notable dental hx.    Pulmonary pneumonia, unresolved,    Pulmonary exam normal breath sounds clear to auscultation       Cardiovascular hypertension, Pt. on medications + dysrhythmias Supra Ventricular Tachycardia  Rhythm:Regular Rate:Tachycardia     Neuro/Psych Dementia negative neurological ROS     GI/Hepatic negative GI ROS, Neg liver ROS,   Endo/Other  Hypothyroidism   Renal/GU Renal InsufficiencyRenal diseaseMetastatic prostate cancer  urosepsis  negative genitourinary   Musculoskeletal negative musculoskeletal ROS (+)   Abdominal   Peds negative pediatric ROS (+)  Hematology  (+) anemia ,   Anesthesia Other Findings   Reproductive/Obstetrics negative OB ROS                            Anesthesia Physical Anesthesia Plan  ASA: 4  Anesthesia Plan: MAC   Post-op Pain Management: Minimal or no pain anticipated   Induction: Intravenous  PONV Risk Score and Plan: 2 and Ondansetron, Dexamethasone and Treatment may vary due to age or medical condition  Airway Management Planned: Nasal Cannula  Additional Equipment:   Intra-op Plan:   Post-operative Plan: Extubation in OR  Informed Consent: I have reviewed the patients History and Physical, chart, labs and discussed the procedure including the risks, benefits and alternatives for the proposed anesthesia with the patient or authorized representative who has indicated his/her understanding and acceptance.     Dental advisory given  Plan Discussed with: CRNA and Surgeon  Anesthesia Plan Comments:        Anesthesia Quick  Evaluation

## 2021-11-11 NOTE — Plan of Care (Signed)
°  Problem: Clinical Measurements: Goal: Ability to maintain clinical measurements within normal limits will improve Outcome: Progressing   Problem: Skin Integrity: Goal: Risk for impaired skin integrity will decrease Outcome: Progressing

## 2021-11-11 NOTE — Procedures (Signed)
Patient Name: Ryett Hamman  MRN: [id number]  Epilepsy Attending: Lora Havens  Referring Physician/Provider: Elodia Florence., MD Date: 11/10/2021 Duration: 22.48 mins  Patient history: 87 year old male with altered mental status. EEG to evaluate for seizure.  Level of alertness: Awake  AEDs during EEG study: None  Technical aspects: This EEG study was done with scalp electrodes positioned according to the 10-20 International system of electrode placement. Electrical activity was acquired at a sampling rate of 500Hz  and reviewed with a high frequency filter of 70Hz  and a low frequency filter of 1Hz . EEG data were recorded continuously and digitally stored.   Description: No clear posterior dominant rhythm was seen.  EEG showed continuous generalized polymorphic mixed frequencies with predominantly 3 to 6 Hz theta and delta slowing admixed with 15 to 18 Hz generalized beta activity. Hyperventilation and photic stimulation were not performed.     ABNORMALITY - Continuous slow, generalized  IMPRESSION: This study is suggestive of moderate diffuse encephalopathy, nonspecific etiology. No seizures or definite epileptiform discharges were seen throughout the recording.  Azaylea Maves Barbra Sarks

## 2021-11-11 NOTE — Progress Notes (Signed)
PT Cancellation Note  Patient Details Name: Anthony Dudley MRN: [id number] DOB: [birth year]   Cancelled Treatment:    Reason Eval/Treat Not Completed: PT screened, no needs identified, will sign off New PT order placed yesterday evening after PT evaluation performed yesterday.  Pt was unable to participate (see PT evaluation 11/10/21).  Please re-order if/when pt is able to participate.  Myrtis Hopping [place] 11/11/2021, 8:50 AM Jannette Spanner PT, DPT Acute Rehabilitation Services Pager: [phone] Office: [phone]

## 2021-11-11 NOTE — Transfer of Care (Signed)
Immediate Anesthesia Transfer of Care Note  Patient: Anthony Dudley  Procedure(s) Performed: CYSTOSCOPY WITH STENT  CHANGE/ SUPRO PUBIC TUBE PLACEMENT (Bilateral) INSERTION OF SUPRAPUBIC CATHETER  Patient Location: PACU  Anesthesia Type:MAC  Level of Consciousness: awake, alert  and oriented  Airway & Oxygen Therapy: Patient Spontanous Breathing and Patient connected to face mask oxygen  Post-op Assessment: Report given to RN and Post -op Vital signs reviewed and stable  Post vital signs: Reviewed and stable  Last Vitals:  Vitals Value Taken Time  BP 102/53 11/11/21 1048  Temp    Pulse 102 11/11/21 1049  Resp 28 11/11/21 1049  SpO2 98 % 11/11/21 1049  Vitals shown include unvalidated device data.  Last Pain:  Vitals:   11/11/21 0835  TempSrc: Oral  PainSc:       Patients Stated Pain Goal: 0 (69/48/54 6270)  Complications: No notable events documented.

## 2021-11-11 NOTE — Op Note (Signed)
Preoperative diagnosis: 1.  Bilateral ureteral obstruction 2.  Chronic urinary retention 3.  Metastatic prostate cancer  Postoperative diagnosis: 1.  Bilateral ureteral obstruction 2.  Chronic urinary retention 3.  Metastatic prostate cancer  Procedures: 1.  Cystoscopy 2.  Bilateral ureteral stent change (7 x 24-no string) 3.  Placement of suprapubic tube  Surgeon: Pryor Curia MD  Anesthesia: General  Complications: None  EBL: Minimal  Specimens: None  Intraoperative findings: He was noted to have significant urethral erosion to the mid shaft of the penis.  The urethra appeared normal.  He was noted to have changes within the prostatic urethra extending intravesically consistent with locally advanced prostate cancer.  His indwelling bilateral ureteral stents were able to be identified and were not encrusted.  Indication: [name] is an 87 year old gentleman with widespread metastatic prostate cancer.  He was recently found to have bilateral ureteral obstruction requiring bilateral nephrostomy tube placement and antegrade stent placement.  He also has chronic urinary retention with a history of urethral erosion.  He presents today for the above procedures.  The potential risks, complications, and expected recovery process was discussed in detail.  Informed consent was obtained.  Description of procedure: Anthony Dudley was admitted to the hospital last week due to sepsis related to a urinary tract source.  He was felt to be medically stable at this point to proceed with the above procedures.  Upon arrival to the preoperative area, he was noted to be in sinus tachycardia with a heart rate of 140.  He had not been tachycardic earlier this morning.  It was felt that he could proceed with the procedure per anesthesia but it was felt that this would be best performed utilizing ketamine rather than general anesthesia.  He has been on broad-spectrum IV antibiotics which were updated.   He was placed in the dorsolithotomy position and prepped and draped in usual sterile fashion.  Next, a cystourethroscopy was performed.  The left ureteral stent was identified and brought out to the urethral meatus with flexible graspers.  A 0.38 sensor guidewire was advanced up the left ureter into the renal pelvis under fluoroscopic guidance.  The stent was removed and a new 7 x 24 double-J ureteral stent was advanced over the wire using Seldinger technique and positioned appropriately under fluoroscopic and cystoscopic guidance.  The wire was removed with a good curl noted in the renal pelvis as well as within the bladder.  Attention then turned to the right side and an identical procedure was performed.  A new 7 x 24 double-J ureteral stent was also placed on this side in a similar fashion.  The Lowsley retractor was then placed transurethrally and was able to be palpated in the suprapubic region.  10 cc of quarter percent bupivacaine was administered to provide local anesthesia.  A small horizontal incision was made and carried down through the subcutaneous tissues with electrocautery.  The fascia was incised and the Lowsley retractor was brought out through this incision.  A 0 silk suture was passed through the eyelet of a 20 [place] catheter.  This was grasped with a Lowsley retractor and used to bring the suprapubic tube into the bladder.  This was visualized cystoscopically to ensure proper placement and the Foley balloon was inflated with 20 cc of sterile water.  The suprapubic tube was secured with 2-0 silk sutures and a sterile dressing was applied.  This was placed to straight drainage.  The patient tolerated the procedure well without  complications.  He was able to be transferred to the recovery unit in satisfactory condition.

## 2021-11-11 NOTE — Progress Notes (Signed)
Received report for surgery from floor nurse.

## 2021-11-11 NOTE — Progress Notes (Signed)
Patient ID: Anthony Dudley, male   DOB: 1933-02-02, 87 y.o.   MRN: [id number]  Day of Surgery Subjective: Pt is stable condition with baseline mental status.  Noted to be in sinus tachycardia in preoperative area with HR to 140s.  Was not tachycardic overnight or earlier this morning.   Objective: Vital signs in last 24 hours: Temp:  [98.1 F (36.7 C)-99.4 F (37.4 C)] 99.4 F (37.4 C) (12/29 0835) Pulse Rate:  [80-102] 102 (12/29 0426) Resp:  [18-21] 18 (12/29 0426) BP: (95-112)/(44-70) 109/70 (12/29 0835) SpO2:  [92 %-98 %] 92 % (12/29 0835) Weight:  [74.6 kg] 74.6 kg (12/29 0500)  Intake/Output from previous day: 12/28 0701 - 12/29 0700 In: 1528.8 [I.V.:995; IV Piggyback:533.8] Out: 700 [Urine:700] Intake/Output this shift: No intake/output data recorded.  Physical Exam:   Abd: Soft with no clear pain elucidated GU: Foley in place with clear urine  Lab Results: Recent Labs    11/09/21 0446 11/10/21 0445 11/11/21 0431  HGB 8.1* 7.9* 8.5*  HCT 25.9* 25.6* 27.2*   BMET Recent Labs    11/10/21 0445 11/11/21 0431  NA 142 144  K 3.3* 3.7  CL 119* 120*  CO2 18* 18*  GLUCOSE 126* 107*  BUN 22 19  CREATININE 1.99* 2.15*  CALCIUM 7.8* 7.9*     Studies/Results: CT HEAD WO CONTRAST (5MM)  Result Date: 11/09/2021 CLINICAL DATA:  Delirium EXAM: CT HEAD WITHOUT CONTRAST TECHNIQUE: Contiguous axial images were obtained from the base of the skull through the vertex without intravenous contrast. COMPARISON:  No prior CT of the head was available for comparison. FINDINGS: Brain: No evidence of acute infarction, hemorrhage, cerebral edema, mass, mass effect, or midline shift. No hydrocephalus or extra-axial fluid collection. General cerebral atrophy, without lobar predominance, likely within normal limits for age. Vascular: No hyperdense vessel. Skull: Normal. Negative for fracture or focal lesion. Sinuses/Orbits: Mucosal thickening in the ethmoid air cells, with near-complete  opacification of the right sphenoid sinus. Status post bilateral lens replacements. Other: The mastoid air cells are well aerated. IMPRESSION: IMPRESSION No acute intracranial process. Electronically Signed   By: Merilyn Baba M.D.   On: 11/09/2021 11:29   DG CHEST PORT 1 VIEW  Result Date: 11/09/2021 CLINICAL DATA:  Infiltrates. EXAM: PORTABLE CHEST 1 VIEW COMPARISON:  November 08, 2021. FINDINGS: The heart size and mediastinal contours are within normal limits. Right lung is clear. Stable left lung opacities are noted concerning for pneumonia. Stable diffuse osseous sclerosis is noted. IMPRESSION: Stable left lung opacities are noted concerning for pneumonia. Electronically Signed   By: Marijo Conception M.D.   On: 11/09/2021 20:40   EEG adult  Result Date: 11/11/2021 Lora Havens, MD     11/11/2021  8:34 AM Patient Name: Anthony Dudley MRN: [id number] Epilepsy Attending: Lora Havens Referring Physician/Provider: Elodia Florence., MD Date: 11/10/2021 Duration: 22.48 mins Patient history: 88 year old male with altered mental status. EEG to evaluate for seizure. Level of alertness: Awake AEDs during EEG study: None Technical aspects: This EEG study was done with scalp electrodes positioned according to the 10-20 International system of electrode placement. Electrical activity was acquired at a sampling rate of 500Hz  and reviewed with a high frequency filter of 70Hz  and a low frequency filter of 1Hz . EEG data were recorded continuously and digitally stored. Description: No clear posterior dominant rhythm was seen.  EEG showed continuous generalized polymorphic mixed frequencies with predominantly 3 to 6 Hz theta and delta slowing admixed with 15 to  18 Hz generalized beta activity. Hyperventilation and photic stimulation were not performed.   ABNORMALITY - Continuous slow, generalized IMPRESSION: This study is suggestive of moderate diffuse encephalopathy, nonspecific etiology. No seizures or  definite epileptiform discharges were seen throughout the recording. Priyanka Barbra Sarks    Assessment/Plan: 1) Bilateral ureteral obstruction with chronic urinary retention:  Will plan to proceed with cystoscopy and bilateral ureteral stent change and SP tube placement considering overall situation.  Will continue to monitor tachycardia and will discuss with Internal Medicine after procedure if remains tachycardic.  Per anesthesia, will attempt to proceed with ketamine/IV sedation/local and avoid general anesthesia to minimize risk. 2) Metastatic prostate cancer: Continue current treatment and outpatient follow up.   LOS: 8 days   Dutch Gray 11/11/2021, 9:37 AM

## 2021-11-11 NOTE — Anesthesia Postprocedure Evaluation (Signed)
Anesthesia Post Note  Patient: Renee Harder  Procedure(s) Performed: CYSTOSCOPY WITH STENT  CHANGE/ SUPRO PUBIC TUBE PLACEMENT (Bilateral) INSERTION OF SUPRAPUBIC CATHETER     Patient location during evaluation: PACU Anesthesia Type: MAC Level of consciousness: awake and alert Pain management: pain level controlled Vital Signs Assessment: post-procedure vital signs reviewed and stable Respiratory status: spontaneous breathing, nonlabored ventilation, respiratory function stable and patient connected to nasal cannula oxygen Cardiovascular status: stable and blood pressure returned to baseline Postop Assessment: no apparent nausea or vomiting Anesthetic complications: no   No notable events documented.  Last Vitals:  Vitals:   11/11/21 1100 11/11/21 1115  BP: (!) 98/43 (!) 115/59  Pulse: 98 96  Resp: (!) 22 (!) 22  Temp:    SpO2: 100% 100%    Last Pain:  Vitals:   11/11/21 1130  TempSrc:   PainSc: Asleep                 Lagretta Loseke S

## 2021-11-11 NOTE — Progress Notes (Signed)
[facility] PROGRESS NOTE  Patient: Anthony Dudley IOM:[id number]   PCP: [name], MD DOB: 10-31-1933   DOA: 11/03/2021   DOS: 11/11/2021    Subjective: Seen after the surgery.  Drowsy.  Objective:  Vitals:   11/11/21 1155 11/11/21 1208  BP: 123/64 121/84  Pulse: (!) 103 (!) 108  Resp: (!) 28 20  Temp: 98 F (36.7 C) 98 F (36.7 C)  SpO2: 93% 97%  S1-S2 present. Bowel sound present. Clear to auscultation. Drowsy. Withdraws to painful stimuli.   Assessment and plan: Recurrent UTI and urinary retention. SP suprapubic catheter placement as well as bilateral urinary stent exchange. Appreciate urology assistance. Monitor closely. Suprapubic catheter has some blood as expected postoperatively. Should clear up. Once able to tolerate p.o., advance to dysphagia 1 diet nectar thick.  Author: Berle Mull, MD [facility] 11/11/2021 7:00 PM   If 7PM-7AM, please contact night-coverage at [email or web address]

## 2021-11-12 ENCOUNTER — Encounter (HOSPITAL_COMMUNITY): Payer: Self-pay | Admitting: Urology

## 2021-11-12 ENCOUNTER — Other Ambulatory Visit: Payer: Self-pay

## 2021-11-12 DIAGNOSIS — J690 Pneumonitis due to inhalation of food and vomit: Secondary | ICD-10-CM | POA: Diagnosis not present

## 2021-11-12 DIAGNOSIS — A419 Sepsis, unspecified organism: Secondary | ICD-10-CM | POA: Diagnosis not present

## 2021-11-12 DIAGNOSIS — R6521 Severe sepsis with septic shock: Secondary | ICD-10-CM | POA: Diagnosis not present

## 2021-11-12 DIAGNOSIS — N390 Urinary tract infection, site not specified: Secondary | ICD-10-CM | POA: Diagnosis not present

## 2021-11-12 LAB — COMPREHENSIVE METABOLIC PANEL
ALT: 13 U/L (ref 0–44)
AST: 22 U/L (ref 15–41)
Albumin: 2.1 g/dL — ABNORMAL LOW (ref 3.5–5.0)
Alkaline Phosphatase: 355 U/L — ABNORMAL HIGH (ref 38–126)
Anion gap: 7 (ref 5–15)
BUN: 23 mg/dL (ref 8–23)
CO2: 17 mmol/L — ABNORMAL LOW (ref 22–32)
Calcium: 7.8 mg/dL — ABNORMAL LOW (ref 8.9–10.3)
Chloride: 119 mmol/L — ABNORMAL HIGH (ref 98–111)
Creatinine, Ser: 2.21 mg/dL — ABNORMAL HIGH (ref 0.61–1.24)
GFR, Estimated: 28 mL/min — ABNORMAL LOW (ref >60)
Glucose, Bld: 153 mg/dL — ABNORMAL HIGH (ref 70–99)
Potassium: 3.9 mmol/L (ref 3.5–5.1)
Sodium: 143 mmol/L (ref 135–145)
Total Bilirubin: 0.5 mg/dL (ref 0.3–1.2)
Total Protein: 6.5 g/dL (ref 6.5–8.1)

## 2021-11-12 LAB — CBC
HCT: 27 % — ABNORMAL LOW (ref 39.0–52.0)
Hemoglobin: 8.3 g/dL — ABNORMAL LOW (ref 13.0–17.0)
MCH: 32.5 pg (ref 26.0–34.0)
MCHC: 30.7 g/dL (ref 30.0–36.0)
MCV: 105.9 fL — ABNORMAL HIGH (ref 80.0–100.0)
Platelets: 242 10*3/uL (ref 150–400)
RBC: 2.55 MIL/uL — ABNORMAL LOW (ref 4.22–5.81)
RDW: 20 % — ABNORMAL HIGH (ref 11.5–15.5)
WBC: 13.4 10*3/uL — ABNORMAL HIGH (ref 4.0–10.5)
nRBC: 0 % (ref 0.0–0.2)

## 2021-11-12 LAB — TSH: TSH: 9.589 u[IU]/mL — ABNORMAL HIGH (ref 0.350–4.500)

## 2021-11-12 LAB — BASIC METABOLIC PANEL
Anion gap: 6 (ref 5–15)
BUN: 26 mg/dL — ABNORMAL HIGH (ref 8–23)
CO2: 18 mmol/L — ABNORMAL LOW (ref 22–32)
Calcium: 7.4 mg/dL — ABNORMAL LOW (ref 8.9–10.3)
Chloride: 121 mmol/L — ABNORMAL HIGH (ref 98–111)
Creatinine, Ser: 2.18 mg/dL — ABNORMAL HIGH (ref 0.61–1.24)
GFR, Estimated: 29 mL/min — ABNORMAL LOW (ref >60)
Glucose, Bld: 145 mg/dL — ABNORMAL HIGH (ref 70–99)
Potassium: 3.4 mmol/L — ABNORMAL LOW (ref 3.5–5.1)
Sodium: 145 mmol/L (ref 135–145)

## 2021-11-12 LAB — CBC WITH DIFFERENTIAL/PLATELET
Abs Immature Granulocytes: 0.29 10*3/uL — ABNORMAL HIGH (ref 0.00–0.07)
Basophils Absolute: 0.1 10*3/uL (ref 0.0–0.1)
Basophils Relative: 0 %
Eosinophils Absolute: 0 10*3/uL (ref 0.0–0.5)
Eosinophils Relative: 0 %
HCT: 29.9 % — ABNORMAL LOW (ref 39.0–52.0)
Hemoglobin: 9.1 g/dL — ABNORMAL LOW (ref 13.0–17.0)
Immature Granulocytes: 2 %
Lymphocytes Relative: 15 %
Lymphs Abs: 2.1 10*3/uL (ref 0.7–4.0)
MCH: 32.5 pg (ref 26.0–34.0)
MCHC: 30.4 g/dL (ref 30.0–36.0)
MCV: 106.8 fL — ABNORMAL HIGH (ref 80.0–100.0)
Monocytes Absolute: 0.2 10*3/uL (ref 0.1–1.0)
Monocytes Relative: 1 %
Neutro Abs: 11.4 10*3/uL — ABNORMAL HIGH (ref 1.7–7.7)
Neutrophils Relative %: 82 %
Platelets: 253 10*3/uL (ref 150–400)
RBC: 2.8 MIL/uL — ABNORMAL LOW (ref 4.22–5.81)
RDW: 20.2 % — ABNORMAL HIGH (ref 11.5–15.5)
WBC: 14 10*3/uL — ABNORMAL HIGH (ref 4.0–10.5)
nRBC: 0 % (ref 0.0–0.2)

## 2021-11-12 LAB — T4, FREE: Free T4: 0.72 ng/dL (ref 0.61–1.12)

## 2021-11-12 MED ORDER — METOPROLOL TARTRATE 5 MG/5ML IV SOLN
2.5000 mg | Freq: Once | INTRAVENOUS | Status: AC
  Administered 2021-11-12: 16:00:00 2.5 mg via INTRAVENOUS

## 2021-11-12 MED ORDER — METOPROLOL TARTRATE 5 MG/5ML IV SOLN
INTRAVENOUS | Status: AC
  Filled 2021-11-12: qty 5

## 2021-11-12 MED ORDER — FOOD THICKENER (SIMPLYTHICK): 5.0000 | ORAL | Status: DC | PRN

## 2021-11-12 MED ORDER — SODIUM CHLORIDE 0.9 % IV BOLUS
500.0000 mL | Freq: Once | INTRAVENOUS | Status: AC
  Administered 2021-11-12: 16:00:00 500 mL via INTRAVENOUS

## 2021-11-12 NOTE — TOC Progression Note (Signed)
Transition of Care [facility]) - Progression Note    Patient Details  Name: Anthony Dudley MRN: [id number] Date of Birth: Oct 02, 1933  Transition of Care [facility]) CM/SW Contact  Bentleigh Waren, Juliann Pulse, RN Phone Number: 11/12/2021, 12:46 PM  Clinical Narrative: From [facility] sent to [facility] to son Suezanne Jacquet confirmed to return once stable.       Expected Discharge Plan: Long Term Nursing Home Barriers to Discharge: Continued Medical Work up  Expected Discharge Plan and Services Expected Discharge Plan: [place]                                               Social Determinants of Health (SDOH) Interventions    Readmission Risk Interventions No flowsheet data found.

## 2021-11-12 NOTE — Assessment & Plan Note (Addendum)
Nutrition Problem: Moderate Malnutrition Etiology: chronic illness, cancer and cancer related treatments Nutrition Interventions: Interventions: MVI, Prostat, Hormel Shake   Placing the patient at high risk of poor outcome. Consult general surgery for PEG tube placement.,  Appreciate assistance.

## 2021-11-12 NOTE — Assessment & Plan Note (Deleted)
Currently receiving IV fluid.  Will monitor.

## 2021-11-12 NOTE — Progress Notes (Signed)
Speech Language Pathology Treatment: Dysphagia  Patient Details Name: Anthony Dudley MRN: [id number] DOB: April 19, 1933 Today's Date: 11/12/2021 Time: 9201-0071 SLP Time Calculation (min) (ACUTE ONLY): 13 min  Assessment / Plan / Recommendation Clinical Impression  Pt more alert today and communication appears to be approaching baseline.  He is a bit irritable, asserting that he wants "to get the hell out of here," and difficult to engage in eating/drinking because of his focus on departing.  He did accept a few bites of applesauce, and drank 4 oz of nectar thick juice with good overall toleration and no concerns for overt aspiration.  He kept his eyes closed for duration of session; speech was more frequent and also difficult to understand with very hypernasal resonance.  Recommend resuming a dysphagia 1 diet; nectar thick liquids.  It appears that he was drinking thin liquids at the facility PTA - swallow function has been waxing/waning since at least October.  SLP will follow to determine the possibility of advancing back to thins - he may benefit from an MBS, either while inpatient or later as an OP.   HPI HPI: 87 year old gentleman with PMH significant for CKD, hypertension, GERD, CHF, dementia and prostate cancer. He was hospitalized at [facility] hospital in 10/22, surgery with urology 12/15 - now w/ Urinary retention, CT imaging, ascites, left lower lobe.  Pt possibly aspirated with dyspnea and speech/swallow eval ordered.  During October hospital admission, pt had a short term feeding tube placed. He remains full code - and was placed on dys1/nectar diet. 12/29 underwent cystoscopy, bilateral ureteral stent change, and placement of suprapubic tube .      SLP Plan  Continue with current plan of care      Recommendations for follow up therapy are one component of a multi-disciplinary discharge planning process, led by the attending physician.  Recommendations may be updated based on patient status,  additional functional criteria and insurance authorization.    Recommendations  Diet recommendations: Dysphagia 1 (puree);Nectar-thick liquid Liquids provided via: Straw;Cup Medication Administration: Crushed with puree Supervision: Trained caregiver to feed patient Compensations: Minimize environmental distractions                Oral Care Recommendations: Oral care BID Follow Up Recommendations: Follow physician's recommendations for discharge plan and follow up therapies Assistance recommended at discharge: Frequent or constant Supervision/Assistance SLP Visit Diagnosis: Dysphagia, oropharyngeal phase (R13.12) Plan: Continue with current plan of care          Abhiram Criado L. Tivis Ringer, [place] CCC/SLP Acute Rehabilitation Services Office number [phone] Pager [phone]  Juan Quam Laurice  11/12/2021, 10:21 AM

## 2021-11-12 NOTE — Assessment & Plan Note (Signed)
Responsible for third spacing.  Will monitor.

## 2021-11-12 NOTE — NC FL2 (Signed)
[place] LEVEL OF CARE SCREENING TOOL     IDENTIFICATION  Patient Name: Anthony Dudley Birthdate: Sep 28, 1933 Sex: male Admission Date (Current Location): 11/03/2021  [facility] [place] Number:  Herbalist and Address:  [facility],  [place] [address] [place]      Provider Number: [id number]  Attending Physician Name and Address:  Anthony Hamman, MD  Relative Name and Phone Number:  Anthony Dudley (brother)[phone]    Current Level of Care: Hospital Recommended Level of Care: Other (Comment) (LTC) Prior Approval Number:    Date Approved/Denied:   PASRR Number:    Discharge Plan: Other (Comment) (LTC)    Current Diagnoses: Patient Active Problem List   Diagnosis Date Noted   Hypoalbuminemia 11/11/2021   Hyperchloremic metabolic acidosis 76/19/5093   Aspiration pneumonia ([place]) 11/09/2021   Goals of care, counseling/discussion 11/09/2021   Sinus tachycardia 26/71/2458   Acute metabolic encephalopathy 09/98/3382   Lower extremity edema 11/07/2021   Essential hypertension 11/07/2021   Acute urinary retention 11/07/2021   Hypokalemia 11/07/2021   Legally blind 11/07/2021   Septic shock due to urinary tract infection ([place]) 11/03/2021   Dysphagia 09/06/2021   Adult failure to thrive 09/06/2021   Macrocytic anemia 50/53/9767   Folic acid deficiency 34/19/3790   Hypernatremia 09/06/2021   Acute respiratory failure with hypoxia ([place]) 09/06/2021   SVT (supraventricular tachycardia) ([place]) 09/06/2021   Bilateral ureteral obstruction    Malnutrition of moderate degree 08/25/2021   Acute renal failure superimposed on stage 3b chronic kidney disease ([place]) 24/07/7352   Metabolic acidosis 29/92/4268   Sepsis ([place]) 08/18/2021   UTI (urinary tract infection) due to urinary indwelling catheter ([place]) 08/18/2021   Prostate cancer metastatic to multiple sites ([place]) 08/18/2021    Orientation RESPIRATION BLADDER Height & Weight      Self  Normal  (supra pubic cath) Weight: 75.4 kg Height:  5\' 9"  (175.3 cm)  BEHAVIORAL SYMPTOMS/MOOD NEUROLOGICAL BOWEL NUTRITION STATUS      Incontinent Diet (Dysphagia 1)  AMBULATORY STATUS COMMUNICATION OF NEEDS Skin   Total Care Verbally Normal                       Personal Care Assistance Level of Assistance  Bathing, Feeding, Total care Bathing Assistance: Maximum assistance Feeding assistance: Maximum assistance   Total Care Assistance: Maximum assistance   Functional Limitations Info  Sight, Hearing, Speech Sight Info: Impaired (eyeglasses) Hearing Info: Adequate Speech Info: Impaired (Dentures-top/bottom)    SPECIAL CARE FACTORS FREQUENCY                       Contractures Contractures Info: Not present    Additional Factors Info  Code Status, Allergies Code Status Info:  (Full) Allergies Info:  (Solanum Lycopersicum)           Current Medications (11/12/2021):  This is the current hospital active medication list Current Facility-Administered Medications  Medication Dose Route Frequency Provider Last Rate Last Admin   0.9 %  sodium chloride infusion  250 mL Intravenous Continuous Anthony Bring, MD   Stopped at 11/04/21 0043   chlorhexidine (PERIDEX) 0.12 % solution 15 mL  15 mL Mouth Rinse BID Anthony Bring, MD   15 mL at 11/12/21 0941   Chlorhexidine Gluconate Cloth 2 % PADS 6 each  6 each Topical Daily Anthony Bring, MD   6 each at 11/12/21 0942   dextrose 5 % and 0.45 %  NaCl with KCl 20 mEq/L infusion   Intravenous Continuous Anthony Bring, MD 75 mL/hr at 11/11/21 1935 New Bag at 11/11/21 1935   docusate sodium (COLACE) capsule 100 mg  100 mg Oral BID PRN Anthony Bring, MD       food thickener (SIMPLYTHICK (NECTAR/LEVEL 2/MILDLY THICK)) 1 packet  1 packet Oral PRN Anthony Bring, MD       food thickener (SIMPLYTHICK (NECTAR/LEVEL 2/MILDLY THICK)) 5 packet  5 packet Oral PRN Anthony Hamman, MD       heparin injection 5,000 Units  5,000  Units Subcutaneous Camelia Phenes Anthony Bring, MD   5,000 Units at 11/12/21 0510   MEDLINE mouth rinse  15 mL Mouth Rinse q12n4p Anthony Bring, MD   15 mL at 11/10/21 1526   piperacillin-tazobactam (ZOSYN) IVPB 3.375 g  3.375 g Intravenous Queen Blossom, MD 12.5 mL/hr at 11/12/21 0510 3.375 g at 11/12/21 0510   polyethylene glycol (MIRALAX / GLYCOLAX) packet 17 g  17 g Oral Daily PRN Anthony Bring, MD         Discharge Medications: Please see discharge summary for a list of discharge medications.  Relevant Imaging Results:  Relevant Lab Results:   Additional Information SS#[id number], [place], [place]

## 2021-11-12 NOTE — Progress Notes (Signed)
Progress Note   Patient: Anthony Dudley ZJI:[id number] DOB: May 02, 1933 DOA: 11/03/2021     9 DOS: the patient was seen and examined on 11/12/2021   Brief hospital course: 87 yo with CKD, HTN, HF, dementia, prostate cancer who was hospitalized on October with sepsis and AKI on CKD 2/2 obstruction related to metastatic prostate cancer.  He's being treated at this hospitalization for septic shock 2/2 UTI.  He was initially managed in the ICU with need for pressors.  He's been transferred to the hospitalist service on 12/25.  He's encephalopathic, currently covering for UTI and possible pneumonia.    See below for additional details  Assessment and Plan * Septic shock due to urinary tract infection ([place])- (present on admission) Presented to preop visit with urology, found to be febrile and tachycardic with urinary retention with >800 cc dark murky urine Given IM ceftriaxone and sent to ED Initially admitted to ICU with need for pressors -> weaned off  Vanc/cefepime/flagyl x1 on 12/21 Cefepime 12/21-12/26.  Transition to zosyn (12/27- ) with AMS, plan for at least 7 total days abx for UTI and continue for another 24 to 48 hours post suprapubic catheter placement. Urine cx with multiple species.  Per previous notes, prior cx with MDR proteus sensitive to cefepime (09/2019) Blood cx NG CT abd pelvis with mild bilateral hydro (decreased from prior) - bilateral ureteral stents Underwent cystoscopy, stent exchange as well as suprapubic catheter placement.  Aspiration pneumonia ([place])- (present on admission) CXR 12/26 with developing patchy infiltrates Cover for pneumonia esp given AMS, follow repeat films MRSA PCR 12/21 was positive although I do not suspect the patient is suffering from MRSA pneumonia and therefore will discontinue vancomycin. Continue Zosyn.  Potential transition to Unasyn.   Acute metabolic encephalopathy- (present on admission) 2/2 acute infection Delirium precautions Seems  worse than his baseline  B12 wnl, VBG without hypercarbia, folate wnl, TSH mildly elevated, free T4 also mildly elevated. CT head unremarkable. EEG negative for any acute seizure Lower cortisol level although do not suspect the patient has adrenal insufficiency. Treat hypernatremia and infection Speech therapy consult appreciated.  Currently on D1 diet. CXR with pneumonia, which could contribute  Hyperchloremic metabolic acidosis- (present on admission) Currently receiving IV fluid.  Will monitor.  Hypokalemia- (present on admission) Replace and follow  Hypernatremia- (present on admission) Corrected.  Continue IV fluid.  Likely hyponatremia from poor p.o. intake.  Which means it would be recurrent.  Acute renal failure superimposed on stage 3b chronic kidney disease ([place])- (present on admission) Baseline creatinine 2.2 Peaked at 6.08 here, improving, follow 2/2 sepsis S/p bilateral antegrade ureter stents on 10/18 by IR 2/2 obstructive uropathy (initially bilateral perc nephrostomy tubes placed 08/19/21)  Acute urinary retention- (present on admission) Underwent suprapubic catheter placement and stent exchange.  Prostate cancer metastatic to multiple sites [facility])- (present on admission) PSA 2,268 10/22 Follows with urology, extensive bone mets     Macrocytic anemia- (present on admission) Trend S/p 1 unit prbc Fluctuating, trend  Sinus tachycardia- (present on admission) Improved, follow. Intermittent.  Etiology not clear.  Monitor.  Patient remains asymptomatic.  Essential hypertension- (present on admission) Blood pressure stable without any medication.  Monitor.  Dysphagia SLP eval, dysphagia 1 nectar thick Will require calorie count.  Hypoalbuminemia- (present on admission) Responsible for third spacing.  Will monitor.  Goals of care, counseling/discussion Discussed code status with brother ben, he desires to continue full code.  Would be interested in feeding  tube if needed (previously discussed)  Lower extremity edema- (present on admission) LE [place] negative for DVT on L  Legally blind- (present on admission) Brother ordering glases  Adult failure to thrive- (present on admission) Placing the patient at high risk of poor outcome.     Subjective: Denies any acute complain. Minimal oral intake.  No fever no chills.  Tachycardic in the afternoon resolved on its own.  Objective Tachycardic in the afternoon.  Blood pressure stable. General: Appear in mild distress, no Rash; Oral Mucosa Clear, dry. no Abnormal Neck Mass Or lumps, Conjunctiva normal  Cardiovascular: S1 and S2 Present, no Murmur, Respiratory: good respiratory effort, Bilateral Air entry present and CTA, no Crackles, no wheezes Abdomen: Bowel Sound present, Soft and no tenderness Extremities: no Pedal edema Neurology: alert and oriented to self only. affect appropriate. no new focal deficit Gait not checked due to patient safety concerns   Data Reviewed: My review of labs, imaging, notes and other tests shows no new significant findings.   Family Communication: None at bedside  Disposition: Status is: Inpatient  Remains inpatient appropriate because: Poor p.o. intake.  Unable to sustain nutrition on its own.         Time spent: 35 minutes  Author: Berle Mull 11/12/2021 9:06 PM  For on call review [email or web address].

## 2021-11-13 ENCOUNTER — Other Ambulatory Visit: Payer: Self-pay

## 2021-11-13 DIAGNOSIS — I129 Hypertensive chronic kidney disease with stage 1 through stage 4 chronic kidney disease, or unspecified chronic kidney disease: Secondary | ICD-10-CM

## 2021-11-13 DIAGNOSIS — A419 Sepsis, unspecified organism: Secondary | ICD-10-CM | POA: Diagnosis not present

## 2021-11-13 DIAGNOSIS — N390 Urinary tract infection, site not specified: Secondary | ICD-10-CM | POA: Diagnosis not present

## 2021-11-13 DIAGNOSIS — J690 Pneumonitis due to inhalation of food and vomit: Secondary | ICD-10-CM | POA: Diagnosis not present

## 2021-11-13 DIAGNOSIS — R6521 Severe sepsis with septic shock: Secondary | ICD-10-CM | POA: Diagnosis not present

## 2021-11-13 LAB — CBC WITH DIFFERENTIAL/PLATELET
Abs Immature Granulocytes: 0.09 10*3/uL — ABNORMAL HIGH (ref 0.00–0.07)
Basophils Absolute: 0 10*3/uL (ref 0.0–0.1)
Basophils Relative: 0 %
Eosinophils Absolute: 0.1 10*3/uL (ref 0.0–0.5)
Eosinophils Relative: 1 %
HCT: 24.6 % — ABNORMAL LOW (ref 39.0–52.0)
Hemoglobin: 7.5 g/dL — ABNORMAL LOW (ref 13.0–17.0)
Immature Granulocytes: 1 %
Lymphocytes Relative: 26 %
Lymphs Abs: 2.5 10*3/uL (ref 0.7–4.0)
MCH: 31.8 pg (ref 26.0–34.0)
MCHC: 30.5 g/dL (ref 30.0–36.0)
MCV: 104.2 fL — ABNORMAL HIGH (ref 80.0–100.0)
Monocytes Absolute: 0.5 10*3/uL (ref 0.1–1.0)
Monocytes Relative: 5 %
Neutro Abs: 6.6 10*3/uL (ref 1.7–7.7)
Neutrophils Relative %: 67 %
Platelets: 225 10*3/uL (ref 150–400)
RBC: 2.36 MIL/uL — ABNORMAL LOW (ref 4.22–5.81)
RDW: 20.3 % — ABNORMAL HIGH (ref 11.5–15.5)
WBC: 9.8 10*3/uL (ref 4.0–10.5)
nRBC: 0 % (ref 0.0–0.2)

## 2021-11-13 LAB — COMPREHENSIVE METABOLIC PANEL
ALT: 12 U/L (ref 0–44)
AST: 18 U/L (ref 15–41)
Albumin: 1.9 g/dL — ABNORMAL LOW (ref 3.5–5.0)
Alkaline Phosphatase: 263 U/L — ABNORMAL HIGH (ref 38–126)
Anion gap: 8 (ref 5–15)
BUN: 26 mg/dL — ABNORMAL HIGH (ref 8–23)
CO2: 18 mmol/L — ABNORMAL LOW (ref 22–32)
Calcium: 7.8 mg/dL — ABNORMAL LOW (ref 8.9–10.3)
Chloride: 122 mmol/L — ABNORMAL HIGH (ref 98–111)
Creatinine, Ser: 2.1 mg/dL — ABNORMAL HIGH (ref 0.61–1.24)
GFR, Estimated: 30 mL/min — ABNORMAL LOW (ref >60)
Glucose, Bld: 95 mg/dL (ref 70–99)
Potassium: 3.3 mmol/L — ABNORMAL LOW (ref 3.5–5.1)
Sodium: 148 mmol/L — ABNORMAL HIGH (ref 135–145)
Total Bilirubin: 0.4 mg/dL (ref 0.3–1.2)
Total Protein: 5.5 g/dL — ABNORMAL LOW (ref 6.5–8.1)

## 2021-11-13 MED ORDER — POTASSIUM CHLORIDE 10 MEQ/100ML IV SOLN
10.0000 meq | INTRAVENOUS | Status: AC
  Administered 2021-11-13 (×3): 10 meq via INTRAVENOUS
  Filled 2021-11-13 (×3): qty 100

## 2021-11-13 MED ORDER — POTASSIUM CHLORIDE 2 MEQ/ML IV SOLN
INTRAVENOUS | Status: DC
  Filled 2021-11-13 (×9): qty 1000

## 2021-11-13 MED ORDER — ADULT MULTIVITAMIN W/MINERALS CH
1.0000 | ORAL_TABLET | Freq: Every day | ORAL | Status: DC
  Administered 2021-11-13 – 2021-11-15 (×3): 1 via ORAL
  Filled 2021-11-13 (×4): qty 1

## 2021-11-13 MED ORDER — NEPRO/CARBSTEADY PO LIQD
237.0000 mL | Freq: Three times a day (TID) | ORAL | Status: DC
  Administered 2021-11-13 – 2021-11-16 (×3): 237 mL via ORAL
  Filled 2021-11-13 (×11): qty 237

## 2021-11-13 MED ORDER — PROSOURCE PLUS PO LIQD
30.0000 mL | Freq: Two times a day (BID) | ORAL | Status: DC
  Administered 2021-11-13 – 2021-11-22 (×9): 30 mL via ORAL
  Filled 2021-11-13 (×9): qty 30

## 2021-11-13 NOTE — Progress Notes (Signed)
Initial Nutrition Assessment RD working remotely.   DOCUMENTATION CODES:   Not applicable  INTERVENTION:  - continue Nepro Shake TID, each supplement provides 425 kcal and 19 grams protein. - will consider changing Nepro to Hormel Shake if acceptance of Nepro is poor.  - will order 30 ml Prosource Plus BID, each supplement provides 100 kcal and 15 grams protein.  - will order 1 tablet multivitamin with minerals/day. - RD will follow-up for Calorie Count results on 1/2.   NUTRITION DIAGNOSIS:   Increased nutrient needs related to acute illness, cancer and cancer related treatments, chronic illness as evidenced by estimated needs.  GOAL:   Patient will meet greater than or equal to 90% of their needs  MONITOR:   PO intake, Supplement acceptance, Labs, Weight trends, Skin  REASON FOR ASSESSMENT:   Consult Calorie Count  ASSESSMENT:   87 yo male with medical history of CKD, HTN, heart failure, dementia, and prostate cancer. Of note, he was hospitalized in 08/2021 with sepsis and AKI on CKD 2/2 obstruction related to metastatic prostate cancer. Patient was admitted for septic shock 2/2 UTI.  Today is LOS #9. Diet advanced from NPO to Dysphagia 1, nectar-thick liquids on 12/22, changed back to NPO at midnight on 12/29, and then re-advanced to Dysphagia 1, nectar-thick liquids yesterday at 1026.   Meal intakes from 12/28-12/30 were 0%. He ate 50% of breakfast and 40% of lunch today.   Patient is noted to be a/o to self only with memory impairment.   He was last assessed by a [place] RD on 09/03/21 at which time he met criteria for moderate malnutrition related to chronic illness (dementia) as evidenced by mild fat depletion, moderate fat depletion, mild muscle depletion, moderate muscle depletion.  At that time, patient had NGT placed by IR and was receiving TF.  Weight today is 166 lb and weight has fluctuated nearly daily since admission. Weight on 09/06/21 was 169 lb.     Labs reviewed; Na: 148 mmol/l, K: 3.3 mmol/l, Cl: 122 mmol/l, BUN: 26 mg/dl, creatinine: 2.1 mg/dl, Ca: 7.8 mg/dl, Alk Phos elevated but down from 12/30, GFR: 30 ml/min.   Medications reviewed; 10 mEq IV KCl x3 runs 12/31.  IVF; D5-1/2 NS-20 mEq IV KCl @ 75 ml/hr (306 kcal/24 hrs).     NUTRITION - FOCUSED PHYSICAL EXAM:  RD working remotely.  Diet Order:   Diet Order             DIET - DYS 1 Room service appropriate? No; Fluid consistency: Nectar Thick  Diet effective now                   EDUCATION NEEDS:   Not appropriate for education at this time  Skin:  Skin Assessment: Skin Integrity Issues: Skin Integrity Issues:: DTI DTI: sacrum  Last BM:  12/31 (smear of type 5)  Height:   Ht Readings from Last 1 Encounters:  11/03/21 5' 9" (1.753 m)    Weight:   Wt Readings from Last 1 Encounters:  11/13/21 75.3 kg     Estimated Nutritional Needs:  Kcal:  2100-2300 kcal Protein:  105-120 grams Fluid:  >/= 2.3 L/day      Anthony Matin, MS, RD, LDN, CNSC Inpatient Clinical Dietitian RD pager # available in [facility]  After hours/weekend pager # available in [facility]

## 2021-11-13 NOTE — Progress Notes (Signed)
Calorie count initiated at noon today 12/31. Envelope hung on door.

## 2021-11-13 NOTE — Progress Notes (Signed)
Progress Note   Patient: Anthony Dudley FOY:[id number] DOB: 09-Sep-1933 DOA: 11/03/2021     10 DOS: the patient was seen and examined on 11/13/2021   Brief hospital course: 87 yo with CKD, HTN, HF, dementia, prostate cancer who was hospitalized on October with sepsis and AKI on CKD 2/2 obstruction related to metastatic prostate cancer.  He's being treated at this hospitalization for septic shock 2/2 UTI.  He was initially managed in the ICU with need for pressors.  He's been transferred to the hospitalist service on 12/25.  He's encephalopathic, currently covering for UTI and possible pneumonia.    See below for additional details  Assessment and Plan * Septic shock due to urinary tract infection ([place])- (present on admission) On presentation febrile, tachycardic Admitted to ICU requiring pressors. Cefepime 12/21-12/26. Transition to zosyn 12/27 with AMS, Last day of antibiotic 1/1. Urine cx with multiple species. Per previous notes, prior cx with MDR proteus sensitive to cefepime Blood cx NG CT abd pelvis with mild bilateral hydro (decreased from prior) - bilateral ureteral stents Underwent cystoscopy, stent exchange as well as suprapubic catheter placement.  Aspiration pneumonia ([place])- (present on admission) CXR 12/26 with developing patchy infiltrates Patient was already on IV cefepime.  Transition to IV Zosyn. Was briefly on vancomycin due to positive MRSA PCR although do not think the patient has MRSA pneumonia. Only need 7 days of antibiotic for this.  Acute metabolic encephalopathy- (present on admission) 2/2 acute infection Delirium precautions Per family seems worse than his baseline  B12 wnl, VBG without hypercarbia, folate wnl, TSH mildly elevated, free T4 also mildly elevated. CT head unremarkable. EEG negative for any acute seizure Lower cortisol level although do not suspect the patient has adrenal insufficiency. Speech therapy consult appreciated.  Currently on D1  diet. CXR with pneumonia, which could contribute  Hyperchloremic metabolic acidosis- (present on admission) Currently receiving IV fluid.  Will monitor.  Hypokalemia- (present on admission) Replace and follow  Hypernatremia- (present on admission) Corrected.  Continue IV fluid.  Likely hyponatremia from poor p.o. intake.  Which means it would be recurrent.  Acute renal failure superimposed on stage 3b chronic kidney disease ([place])- (present on admission) Baseline creatinine 2.2 Peaked at 6.08 here, improving, follow 2/2 sepsis S/p bilateral antegrade ureter stents on 10/18 by IR 2/2 obstructive uropathy (initially bilateral perc nephrostomy tubes placed 08/19/21)  Acute urinary retention- (present on admission) Underwent suprapubic catheter placement and stent exchange.  Prostate cancer metastatic to multiple sites [facility])- (present on admission) PSA 2,268 10/22 Follows with urology, extensive bone mets     Macrocytic anemia- (present on admission) At baseline hemoglobin around 8. On presentation hemoglobin was 7.2.  Noted to 6.8 likely dilutional in nature. SP 1 PRBC transfusion hemoglobin remained stable around 8 again. Currently hemoglobin 7.5 likely in the setting of procedure related bleeding as well as dilution. Will transfuse for hemoglobin less than 7.  Sinus tachycardia- (present on admission) Improved, follow. Intermittent.  Etiology not clear.  Monitor.  Patient remains asymptomatic.  Essential hypertension- (present on admission) Blood pressure stable without any medication.  Monitor.  Dysphagia SLP eval, dysphagia 1 nectar thick Will require calorie count.  Hypoalbuminemia- (present on admission) Responsible for third spacing.  Will monitor.  Goals of care, counseling/discussion Discussed code status with brother ben, he desires to continue full code.  Would be interested in feeding tube if needed (previously discussed)  Lower extremity edema- (present on  admission) LE [place] negative for DVT on L  Legally blind- (  present on admission) Brother ordering glases  Adult failure to thrive- (present on admission) Placing the patient at high risk of poor outcome. Will be initiating calorie count for the patient to ensure appropriate oral intake.     Subjective: No nausea no vomiting.  No fever no chills.  No chest pain abdominal pain.  Objective Vital signs were reviewed and unremarkable. General: Appear in mild distress, no Rash; Oral Mucosa Clear, moist. no Abnormal Neck Mass Or lumps, Conjunctiva normal  Cardiovascular: S1 and S2 Present, no Murmur, Respiratory: good respiratory effort, Bilateral Air entry present and CTA, no Crackles, no wheezes Abdomen: Bowel Sound present, Soft and no tenderness Extremities: no Pedal edema Neurology: alert and oriented to time, place, and person affect appropriate. no new focal deficit Gait not checked due to patient safety concerns   Data Reviewed: Worsening hypernatremia, anemia, hypokalemia.  Improving leukocytosis  Family Communication: None at bedside.  Disposition: Status is: Inpatient  Remains inpatient appropriate because: Poor p.o. intake, abnormal electrolyte.         Time spent: 35 minutes  Author: Berle Mull 11/13/2021 6:18 PM  For on call review [email or web address]

## 2021-11-14 DIAGNOSIS — A419 Sepsis, unspecified organism: Secondary | ICD-10-CM | POA: Diagnosis not present

## 2021-11-14 DIAGNOSIS — R6521 Severe sepsis with septic shock: Secondary | ICD-10-CM | POA: Diagnosis not present

## 2021-11-14 DIAGNOSIS — J690 Pneumonitis due to inhalation of food and vomit: Secondary | ICD-10-CM | POA: Diagnosis not present

## 2021-11-14 DIAGNOSIS — N390 Urinary tract infection, site not specified: Secondary | ICD-10-CM | POA: Diagnosis not present

## 2021-11-14 LAB — BASIC METABOLIC PANEL
Anion gap: 3 — ABNORMAL LOW (ref 5–15)
BUN: 21 mg/dL (ref 8–23)
CO2: 18 mmol/L — ABNORMAL LOW (ref 22–32)
Calcium: 7.7 mg/dL — ABNORMAL LOW (ref 8.9–10.3)
Chloride: 123 mmol/L — ABNORMAL HIGH (ref 98–111)
Creatinine, Ser: 1.95 mg/dL — ABNORMAL HIGH (ref 0.61–1.24)
GFR, Estimated: 33 mL/min — ABNORMAL LOW (ref >60)
Glucose, Bld: 88 mg/dL (ref 70–99)
Potassium: 3.5 mmol/L (ref 3.5–5.1)
Sodium: 144 mmol/L (ref 135–145)

## 2021-11-14 LAB — CBC
HCT: 24.8 % — ABNORMAL LOW (ref 39.0–52.0)
Hemoglobin: 7.4 g/dL — ABNORMAL LOW (ref 13.0–17.0)
MCH: 31.9 pg (ref 26.0–34.0)
MCHC: 29.8 g/dL — ABNORMAL LOW (ref 30.0–36.0)
MCV: 106.9 fL — ABNORMAL HIGH (ref 80.0–100.0)
Platelets: 208 10*3/uL (ref 150–400)
RBC: 2.32 MIL/uL — ABNORMAL LOW (ref 4.22–5.81)
RDW: 20.3 % — ABNORMAL HIGH (ref 11.5–15.5)
WBC: 8.1 10*3/uL (ref 4.0–10.5)
nRBC: 0 % (ref 0.0–0.2)

## 2021-11-14 LAB — MAGNESIUM: Magnesium: 1.6 mg/dL — ABNORMAL LOW (ref 1.7–2.4)

## 2021-11-14 MED ORDER — MAGNESIUM SULFATE 2 GM/50ML IV SOLN
2.0000 g | Freq: Once | INTRAVENOUS | Status: AC
  Administered 2021-11-14: 2 g via INTRAVENOUS
  Filled 2021-11-14: qty 50

## 2021-11-14 NOTE — Progress Notes (Addendum)
Progress Note   Patient: Anthony Dudley UKG:[id number] DOB: [birth year] DOA: 11/03/2021     11 DOS: the patient was seen and examined on 11/14/2021   Brief hospital course: 88 yo with CKD, HTN, HF, dementia, prostate cancer who was hospitalized on October with sepsis and AKI on CKD 2/2 obstruction related to metastatic prostate cancer.  He's being treated at this hospitalization for septic shock 2/2 UTI.  He was initially managed in the ICU with need for pressors.  He's been transferred to the hospitalist service on 12/25.  He's encephalopathic, currently covering for UTI and possible pneumonia.    See below for additional details  Assessment and Plan * Septic shock due to urinary tract infection ([place])- (present on admission) On presentation febrile, tachycardic Admitted to ICU requiring pressors. Cefepime 12/21-12/26. Transition to zosyn 12/27 with AMS, Last day of antibiotic 1/1. Urine cx with multiple species. Per previous notes, prior cx with MDR proteus sensitive to cefepime Blood cx NG CT abd pelvis with mild bilateral hydro (decreased from prior) - bilateral ureteral stents Underwent cystoscopy, stent exchange as well as suprapubic catheter placement.  Aspiration pneumonia ([place])- (present on admission) CXR 12/26 with developing patchy infiltrates Patient was already on IV cefepime.  Transition to IV Zosyn. Was briefly on vancomycin due to positive MRSA PCR although do not think the patient has MRSA pneumonia. Only need 7 days of antibiotic for this.  Acute metabolic encephalopathy- (present on admission) Waxing and waning. Multifactorial, infection, delirium, deconditioning. Delirium precautions, avoid psychotropic medication Per family seems worse than his baseline  B12 wnl, VBG without hypercarbia, folate wnl, TSH mildly elevated, free T4 also mildly elevated. CT head unremarkable. EEG negative for any acute seizure Lower cortisol level although do not suspect the patient  has adrenal insufficiency.  We will confirm with cosyntropin stimulation test. Speech therapy consult appreciated.  Currently on D1 diet. CXR with pneumonia, which could contribute  Hyperchloremic metabolic acidosis- (present on admission) Currently receiving IV fluid.  Will monitor.  Hypokalemia- (present on admission) Replace and follow  Hypernatremia- (present on admission) Corrected.  Continue IV fluid.  Likely hypernatremia from poor p.o. intake.  Which means it would be recurrent.  Acute renal failure superimposed on stage 3b chronic kidney disease ([place])- (present on admission) Baseline creatinine 2.2 Peaked at 6.08 here, improving, follow Secondary to dehydration, obstruction as well as sepsis with ATN. S/p bilateral antegrade ureter stents on 10/18 by IR 2/2 obstructive uropathy (initially bilateral perc nephrostomy tubes placed 08/19/21)  Acute urinary retention- (present on admission) Underwent suprapubic catheter placement and stent exchange.  Prostate cancer metastatic to multiple sites [facility])- (present on admission) PSA 2,268 10/22 Follows with urology, extensive bone mets     Macrocytic anemia- (present on admission) At baseline hemoglobin around 8. On presentation hemoglobin was 7.2.  Noted to 6.8 likely dilutional in nature. SP 1 PRBC transfusion hemoglobin remained stable around 8 again. Currently hemoglobin stable around 7 Drop from 8, in the setting of procedure related bleeding as well as dilution. Will transfuse for hemoglobin less than 7.  Brother has provided consent.  Sinus tachycardia- (present on admission) Improved, follow. Intermittent.  Etiology not clear.  Monitor.  Patient remains asymptomatic.  Essential hypertension- (present on admission) Blood pressure stable without any medication.  Monitor.  Dysphagia SLP eval, dysphagia 1 nectar thick Currently undergoing calorie count. POA brother would like to pursue PEG tube placement if he fails  calorie count. Will consult IR tomorrow pending for calorie count results.  IR  has recommended surgical consult in the past for PEG.   Hypoalbuminemia- (present on admission) Responsible for third spacing.  Will monitor.  Goals of care, counseling/discussion Discussed code status with brother ben, he desires to continue full code based on his conversation with Pt in the past. Would be interested in feeding tube if needed.  Understands the risk of aspiration with the PEG tube placement as well as general risk of aspiration with patient's dysphagia.  Lower extremity edema- (present on admission) LE [place] negative for DVT on L  Adult failure to thrive- (present on admission) Placing the patient at high risk of poor outcome. Will be initiating calorie count for the patient to ensure appropriate oral intake.  Hypomagnesemia From poor p.o. intake.  Currently being replaced.     Subjective: Patient is more sleepy and lethargic.  No nausea no vomiting.  Difficult to get medications in.  Objective Vital signs were reviewed and unremarkable. General: Appear in mild distress, no Rash; Oral Mucosa Clear, moist. no Abnormal Neck Mass Or lumps, Conjunctiva normal  Cardiovascular: S1 and S2 Present, no Murmur, Respiratory: good respiratory effort, Bilateral Air entry present and CTA, no Crackles, no wheezes Abdomen: Bowel Sound present, Soft and no tenderness Extremities: no Pedal edema Neurology: Drowsy and incoherent speech, not oriented to time, place, and person affect flat. no new focal deficit Gait not checked due to patient safety concerns   Data Reviewed: My review of labs, imaging, notes and other tests shows no new significant findings.   Family Communication: Discussed with brother on the phone on 11/14/2021.  Disposition: Status is: Inpatient  Remains inpatient appropriate because: Failure to thrive, most likely will require PEG tube placement.  Unable to maintain nutritional  requirement.      Time spent: 50 minutes  Author: Berle Mull 11/14/2021 6:13 PM  For on call review [email or web address].

## 2021-11-14 NOTE — Assessment & Plan Note (Deleted)
From poor p.o. intake.  Currently being replaced.

## 2021-11-15 DIAGNOSIS — J690 Pneumonitis due to inhalation of food and vomit: Secondary | ICD-10-CM | POA: Diagnosis not present

## 2021-11-15 DIAGNOSIS — R6521 Severe sepsis with septic shock: Secondary | ICD-10-CM | POA: Diagnosis not present

## 2021-11-15 DIAGNOSIS — A419 Sepsis, unspecified organism: Secondary | ICD-10-CM | POA: Diagnosis not present

## 2021-11-15 DIAGNOSIS — N390 Urinary tract infection, site not specified: Secondary | ICD-10-CM | POA: Diagnosis not present

## 2021-11-15 LAB — CBC
HCT: 26.3 % — ABNORMAL LOW (ref 39.0–52.0)
Hemoglobin: 7.7 g/dL — ABNORMAL LOW (ref 13.0–17.0)
MCH: 32 pg (ref 26.0–34.0)
MCHC: 29.3 g/dL — ABNORMAL LOW (ref 30.0–36.0)
MCV: 109.1 fL — ABNORMAL HIGH (ref 80.0–100.0)
Platelets: 204 10*3/uL (ref 150–400)
RBC: 2.41 MIL/uL — ABNORMAL LOW (ref 4.22–5.81)
RDW: 20 % — ABNORMAL HIGH (ref 11.5–15.5)
WBC: 6.6 10*3/uL (ref 4.0–10.5)
nRBC: 0 % (ref 0.0–0.2)

## 2021-11-15 LAB — BASIC METABOLIC PANEL
Anion gap: 6 (ref 5–15)
BUN: 19 mg/dL (ref 8–23)
CO2: 19 mmol/L — ABNORMAL LOW (ref 22–32)
Calcium: 8 mg/dL — ABNORMAL LOW (ref 8.9–10.3)
Chloride: 117 mmol/L — ABNORMAL HIGH (ref 98–111)
Creatinine, Ser: 1.81 mg/dL — ABNORMAL HIGH (ref 0.61–1.24)
GFR, Estimated: 36 mL/min — ABNORMAL LOW (ref >60)
Glucose, Bld: 80 mg/dL (ref 70–99)
Potassium: 3.9 mmol/L (ref 3.5–5.1)
Sodium: 142 mmol/L (ref 135–145)

## 2021-11-15 LAB — MAGNESIUM: Magnesium: 2 mg/dL (ref 1.7–2.4)

## 2021-11-15 NOTE — Progress Notes (Signed)
Nutrition Follow-up  DOCUMENTATION CODES:   Not applicable  INTERVENTION:  - continue Nepro Shake TID and Prosource Plus BID. - if patient to remain Full Code, recommend small bore NGT and initiation of nocturnal feeds with ongoing encouragement to eat during the day.    NUTRITION DIAGNOSIS:   Increased nutrient needs related to acute illness, cancer and cancer related treatments, chronic illness as evidenced by estimated needs. -ongoing  GOAL:   Patient will meet greater than or equal to 90% of their needs -unmet   MONITOR:   PO intake, Supplement acceptance, Labs, Weight trends, Skin  ASSESSMENT:   88 yo male with medical history of CKD, HTN, heart failure, dementia, and prostate cancer. Of note, he was hospitalized in 08/2021 with sepsis and AKI on CKD 2/2 obstruction related to metastatic prostate cancer. Patient was admitted for septic shock 2/2 UTI.  Patient is noted to be a/o to self only and was repeating "get me out of here" over and over.  Documented intakes:  12/31- total of 1072 kcal and 43 grams protein from meals + 250 kcal and 16 grams protein from oral nutrition supplements  1/1- the only documentation was 100% of thickened orange juice (70 kcal and 0 grams protein)  1/2- 25% of breakfast (185 kcal and 10 grams protein)    SLP saw patient again today and continues to recommend Dysphagia 1, nectar-thick liquids.  Per review of orders, he has been accepting Nepro Shake 90% of the time offered and Prosource Plus 75% of the time offered.  Weight was mainly stable 12/21-12/31 and is now trending significantly up. Non-pitting edema to all extremities documented in the edema section of flow sheet.     Labs reviewed; Cl: 117 mmol/l, creatinine: 1.01 mg/dl, Ca: 8 mg/dl, GFR: 36 ml/min.   Medications reviewed; 2 g IV Mg sulfate x1 run 1/1.  IVF; D5-30 mEq IV KCl @ 50 ml/hr (204 kcal/24 hrs).   Diet Order:   Diet Order             DIET - DYS 1 Room  service appropriate? No; Fluid consistency: Nectar Thick  Diet effective now                   EDUCATION NEEDS:   Not appropriate for education at this time  Skin:  Skin Assessment: Skin Integrity Issues: Skin Integrity Issues:: DTI DTI: sacrum  Last BM:  12/31 (type 5 smear)  Height:   Ht Readings from Last 1 Encounters:  11/03/21 5\' 9"  (1.753 m)    Weight:   Wt Readings from Last 1 Encounters:  11/15/21 81.3 kg    Estimated Nutritional Needs:  Kcal:  2100-2300 kcal Protein:  105-120 grams Fluid:  >/= 2.3 L/day      Jarome Matin, MS, RD, LDN Inpatient Clinical Dietitian RD pager # available in [place]  After hours/weekend pager # available in [facility]

## 2021-11-15 NOTE — Progress Notes (Signed)
Speech Language Pathology Treatment: Dysphagia  Patient Details Name: Anthony Dudley MRN: [id number] DOB: [birth year] Today's Date: 11/15/2021 Time: 3559-7416 SLP Time Calculation (min) (ACUTE ONLY): 15 min  Assessment / Plan / Recommendation Clinical Impression  Patient seen for skilled SLP session focused on dysphagia goals. When SLP entered room, patient was alert but seemed to be talking to himself. His eyes were open and although he did respond verbally to SLP, he did not make eye contact and his gaze did not change from staring at ceiling. Speech was very difficult to understand but as patient would repeat himself, SLP was able to determine what he was saying. He was perseverative on wanting "to get out of here". He did accept two straw sips of thin liquids (water) and although he initially was slow to suck through straw, SLP did not observe any overt s/s aspiration or penetration, swallow initiation appeared timely and patient did not exhibit the multiple swallows or coughing he was exhibiting during ST session on 12/30. His meal tray was delivered but patient refusing any other PO's and refusing further trials of water, saying "all I want is to get out of here". Unfortunately, do not anticipate that patient would participate in objecitve swallow assessment (MBS) at this time and SLP recommending to continue on current diet of Dys 1 solids, nectar thick liquids but allow for sips of plain water in between meals when alert. SLP will continue to follow for diet toleration, ability to advance PO's, ability to participate in MBS.   HPI HPI: 88 year old gentleman with PMH significant for CKD, hypertension, GERD, CHF, dementia and prostate cancer. He was hospitalized at [facility] hospital in 10/22, surgery with urology 12/15 - now w/ Urinary retention, CT imaging, ascites, left lower lobe.  Pt possibly aspirated with dyspnea and speech/swallow eval ordered.  During October hospital admission, pt had a short term  feeding tube placed. He remains full code - and was placed on dys1/nectar diet. 12/29 underwent cystoscopy, bilateral ureteral stent change, and placement of suprapubic tube .      SLP Plan  Continue with current plan of care      Recommendations for follow up therapy are one component of a multi-disciplinary discharge planning process, led by the attending physician.  Recommendations may be updated based on patient status, additional functional criteria and insurance authorization.    Recommendations  Diet recommendations: Dysphagia 1 (puree);Nectar-thick liquid;Other(comment) (plain water in between meals if alert and after oral care) Liquids provided via: Straw;Cup Medication Administration: Crushed with puree Supervision: Trained caregiver to feed patient Compensations: Minimize environmental distractions Postural Changes and/or Swallow Maneuvers: Seated upright 90 degrees;Upright 30-60 min after meal                Oral Care Recommendations: Oral care BID Follow Up Recommendations: Follow physician's recommendations for discharge plan and follow up therapies Assistance recommended at discharge: Frequent or constant Supervision/Assistance SLP Visit Diagnosis: Dysphagia, oropharyngeal phase (R13.12) Plan: Continue with current plan of care           Sonia Baller, MA, CCC-SLP Speech Therapy

## 2021-11-15 NOTE — TOC Progression Note (Addendum)
Transition of Care [facility]) - Progression Note    Patient Details  Name: Anthony Dudley MRN: [id number] Date of Birth: 11-May-1933  Transition of Care [facility]) CM/SW Contact  Ross Ludwig, [place] Phone Number: 11/15/2021, 6:02 PM  Clinical Narrative:     CSW confirmed patient is LTC at [facility], and he can return once medically ready for discharge.  He will need a new covid test within 24 hours of discharge.   Expected Discharge Plan: Long Term Nursing Home Barriers to Discharge: Continued Medical Work up  Expected Discharge Plan and Services Expected Discharge Plan: [place]                                               Social Determinants of Health (SDOH) Interventions    Readmission Risk Interventions No flowsheet data found.

## 2021-11-15 NOTE — Progress Notes (Signed)
Progress Note   Patient: Anthony Dudley MEQ:[id number] DOB: May 16, 1933 DOA: 11/03/2021     12 DOS: the patient was seen and examined on 11/15/2021   Brief Dudley course: 88 yo with CKD, HTN, HF, dementia, prostate cancer who was hospitalized on October with sepsis and AKI on CKD 2/2 obstruction related to metastatic prostate cancer.  He's being treated at this hospitalization for septic shock 2/2 UTI.  He was initially managed in the ICU with need for pressors.  He's been transferred to the hospitalist service on 12/25.  He's encephalopathic, currently covering for UTI and possible pneumonia.   12/29 underwent cystoscopy, bilateral ureteral stent exchange, placement of suprapubic tube.  EEG negative. 1/2 failed calorie count, recommendation is for feeding tube placement.  IR unable to place a feeding tube.  Will consult general surgery.   Assessment and Plan * Septic shock due to urinary tract infection (Anthony Dudley)- (present on admission) On presentation febrile, tachycardic Admitted to ICU requiring pressors. Cefepime 12/21-12/26. Transition to zosyn 12/27 with AMS, Last day of antibiotic 1/1. Urine cx with multiple species. Per previous notes, prior cx with MDR proteus sensitive to cefepime Blood cx NG CT abd pelvis with mild bilateral hydro (decreased from prior) - bilateral ureteral stents Underwent cystoscopy, stent exchange as well as suprapubic catheter placement.  Acute metabolic encephalopathy- (present on admission) Waxing and waning. Multifactorial, infection, delirium, deconditioning. Delirium precautions, avoid psychotropic medication Per family seems worse than his baseline  B12 wnl, VBG without hypercarbia, folate wnl, TSH mildly elevated, free T4 also mildly elevated. CT head unremarkable. EEG negative for any acute seizure Lower cortisol level although do not suspect the patient has adrenal insufficiency.  We will confirm with cosyntropin stimulation test. Speech therapy  consult appreciated.  Currently on D1 diet. CXR with pneumonia, which could contribute  Hyperchloremic metabolic acidosis- (present on admission) Currently receiving IV fluid.  Will monitor.  Hypokalemia- (present on admission) Replace and follow  Hypernatremia- (present on admission) Corrected.  Continue IV fluid.  Likely hypernatremia from poor p.o. intake.  Which means it would be recurrent.  Acute renal failure superimposed on stage 3b chronic kidney disease (Anthony Dudley)- (present on admission) Baseline creatinine 2.2 Peaked at 6.08 here, improving, follow Secondary to dehydration, obstruction as well as sepsis with ATN. S/p bilateral antegrade ureter stents on 10/18 by IR 2/2 obstructive uropathy (initially bilateral perc nephrostomy tubes placed 08/19/21)  Aspiration pneumonia (Anthony Dudley)- (present on admission) CXR 12/26 with developing patchy infiltrates Completed antibiotic treatment course.  Acute urinary retention- (present on admission) Underwent suprapubic catheter placement and stent exchange.  Prostate cancer metastatic to multiple sites Anthony Dudley)- (present on admission) PSA 2,268 10/22 Follows with urology, extensive bone mets     Macrocytic anemia- (present on admission) At baseline hemoglobin around 8. On presentation hemoglobin was 7.2.  Noted to 6.8 likely dilutional in nature. SP 1 PRBC transfusion hemoglobin remained stable around 8 again. Currently hemoglobin stable around 7 Drop from 8, in the setting of procedure related bleeding as well as dilution. Will transfuse for hemoglobin less than 7.  Brother has provided consent.  Sinus tachycardia- (present on admission) Improved, follow. Intermittent.  Etiology not clear.  Monitor.  Patient remains asymptomatic.  Essential hypertension- (present on admission) Blood pressure stable without any medication.  Monitor.  Dysphagia SLP eval, dysphagia 1 nectar thick Currently undergoing calorie count. POA brother would  like to pursue PEG tube placement if he fails calorie count. IR has recommended surgical consult for PEG.   Hypoalbuminemia- (present on  admission) Responsible for third spacing.  Will monitor.  Goals of care, counseling/discussion Discussed code status with brother ben, he desires to continue full code based on his conversation with Pt in the past. Would be interested in feeding tube if needed.  Understands the risk of aspiration with the PEG tube placement as well as general risk of aspiration with patient's dysphagia.  Lower extremity edema- (present on admission) LE [place] negative for DVT on L  Adult failure to thrive- (present on admission) Placing the patient at high risk of poor outcome. Will be initiating calorie count for the patient to ensure appropriate oral intake. Consult general surgery for PEG tube placement.  Hypomagnesemia- (present on admission) From poor p.o. intake.  Currently being replaced.     Subjective: No nausea no vomiting.  No fever no chills.  Able to have a conversation today with me.  Wants to get out of the Dudley but wants to be well as well.  When I mentions that if he wants to get out of the Dudley he will require a tube in his stomach, he mention I would like to get out of the Dudley.  Objective Vital signs were reviewed and unremarkable. General: Appear in mild distress, no Rash; Oral Mucosa Clear, moist. no Abnormal Neck Mass Or lumps, Conjunctiva normal  Cardiovascular: S1 and S2 Present, no Murmur, Respiratory: good respiratory effort, Bilateral Air entry present and CTA, no Crackles, no wheezes Abdomen: Bowel Sound present, Soft and no tenderness Extremities: no Pedal edema Neurology: alert and oriented to place, and person affect appropriate. no new focal deficit Gait not checked due to patient safety concerns    Data Reviewed: My review of labs, imaging, notes and other tests shows no new significant findings.   Family  Communication: None at bedside  Disposition: Status is: Inpatient  Remains inpatient appropriate because: Failed calorie count, unable to maintain nutrition on his own.  Requiring feeding tube.         Time spent: 30 minutes  Author: Berle Mull 11/15/2021 7:42 PM  For on call review [email or web address].

## 2021-11-16 ENCOUNTER — Encounter (HOSPITAL_COMMUNITY): Payer: Self-pay | Admitting: Certified Registered Nurse Anesthetist

## 2021-11-16 ENCOUNTER — Encounter (HOSPITAL_COMMUNITY): Payer: Self-pay | Admitting: Anesthesiology

## 2021-11-16 ENCOUNTER — Encounter (HOSPITAL_COMMUNITY): Payer: Self-pay | Admitting: Internal Medicine

## 2021-11-16 DIAGNOSIS — A419 Sepsis, unspecified organism: Secondary | ICD-10-CM | POA: Diagnosis not present

## 2021-11-16 DIAGNOSIS — L8990 Pressure ulcer of unspecified site, unspecified stage: Secondary | ICD-10-CM | POA: Diagnosis present

## 2021-11-16 DIAGNOSIS — R627 Adult failure to thrive: Secondary | ICD-10-CM

## 2021-11-16 DIAGNOSIS — R6521 Severe sepsis with septic shock: Secondary | ICD-10-CM | POA: Diagnosis not present

## 2021-11-16 DIAGNOSIS — N390 Urinary tract infection, site not specified: Secondary | ICD-10-CM | POA: Diagnosis not present

## 2021-11-16 DIAGNOSIS — L89159 Pressure ulcer of sacral region, unspecified stage: Secondary | ICD-10-CM

## 2021-11-16 DIAGNOSIS — J690 Pneumonitis due to inhalation of food and vomit: Secondary | ICD-10-CM | POA: Diagnosis not present

## 2021-11-16 LAB — BASIC METABOLIC PANEL
Anion gap: 6 (ref 5–15)
BUN: 19 mg/dL (ref 8–23)
CO2: 19 mmol/L — ABNORMAL LOW (ref 22–32)
Calcium: 7.8 mg/dL — ABNORMAL LOW (ref 8.9–10.3)
Chloride: 115 mmol/L — ABNORMAL HIGH (ref 98–111)
Creatinine, Ser: 1.74 mg/dL — ABNORMAL HIGH (ref 0.61–1.24)
GFR, Estimated: 38 mL/min — ABNORMAL LOW (ref >60)
Glucose, Bld: 102 mg/dL — ABNORMAL HIGH (ref 70–99)
Potassium: 3.7 mmol/L (ref 3.5–5.1)
Sodium: 140 mmol/L (ref 135–145)

## 2021-11-16 LAB — CBC
HCT: 25.9 % — ABNORMAL LOW (ref 39.0–52.0)
Hemoglobin: 8.2 g/dL — ABNORMAL LOW (ref 13.0–17.0)
MCH: 32.5 pg (ref 26.0–34.0)
MCHC: 31.7 g/dL (ref 30.0–36.0)
MCV: 102.8 fL — ABNORMAL HIGH (ref 80.0–100.0)
Platelets: 233 10*3/uL (ref 150–400)
RBC: 2.52 MIL/uL — ABNORMAL LOW (ref 4.22–5.81)
RDW: 19.1 % — ABNORMAL HIGH (ref 11.5–15.5)
WBC: 7 10*3/uL (ref 4.0–10.5)
nRBC: 0 % (ref 0.0–0.2)

## 2021-11-16 LAB — GLUCOSE, CAPILLARY
Glucose-Capillary: 84 mg/dL (ref 70–99)
Glucose-Capillary: 84 mg/dL (ref 70–99)

## 2021-11-16 LAB — MAGNESIUM: Magnesium: 1.7 mg/dL (ref 1.7–2.4)

## 2021-11-16 MED ORDER — METOPROLOL SUCCINATE ER 25 MG PO TB24
25.0000 mg | ORAL_TABLET | Freq: Every day | ORAL | Status: DC
  Administered 2021-11-20 – 2021-11-22 (×3): 25 mg via ORAL
  Filled 2021-11-16 (×4): qty 1

## 2021-11-16 MED ORDER — CEFAZOLIN SODIUM-DEXTROSE 2-4 GM/100ML-% IV SOLN: 2.0000 g | INTRAVENOUS | Status: AC

## 2021-11-16 MED ORDER — COSYNTROPIN 0.25 MG IJ SOLR
0.2500 mg | Freq: Once | INTRAMUSCULAR | Status: AC
  Administered 2021-11-17: 0.25 mg via INTRAVENOUS
  Filled 2021-11-16: qty 0.25

## 2021-11-16 MED ORDER — METOPROLOL TARTRATE 5 MG/5ML IV SOLN
5.0000 mg | INTRAVENOUS | Status: DC | PRN
  Administered 2021-11-18: 23:00:00 5 mg via INTRAVENOUS
  Filled 2021-11-16: qty 5

## 2021-11-16 MED ORDER — SODIUM CHLORIDE 0.9 % IV BOLUS
500.0000 mL | Freq: Once | INTRAVENOUS | Status: AC
  Administered 2021-11-16: 500 mL via INTRAVENOUS

## 2021-11-16 NOTE — Assessment & Plan Note (Signed)
Mid sacral area, present on admission.  Continue foam dressing.  Pressure Injury 11/07/21 Sacrum Mid Deep Tissue Pressure Injury - Purple or maroon localized area of discolored intact skin or blood-filled blister due to damage of underlying soft tissue from pressure and/or shear. (Active)  11/07/21 1600  Location: Sacrum  Location Orientation: Mid  Staging: Deep Tissue Pressure Injury - Purple or maroon localized area of discolored intact skin or blood-filled blister due to damage of underlying soft tissue from pressure and/or shear.  Wound Description (Comments):   Present on Admission: Yes

## 2021-11-16 NOTE — Progress Notes (Signed)
Nutrition Follow-up  DOCUMENTATION CODES:   Non-severe (moderate) malnutrition in context of chronic illness  INTERVENTION:  - will d/c Nepro. - will order [place] Shake TID with meals, each supplement provides 500 kcal and 22 grams protein. - continue Prosource Plus BID.  - will monitor for possible PEG/G-tube placement.   If G-tube placed, TF recommendations: - Osmolite 1.5 @ 20 ml/hr x14 hours/day (1800-0800) to increase by 10 ml every 14 hours of run time to reach goal rate of 70 ml/hr with 45 ml Prosource TF BID and 100 ml free water QID. - at goal rate, this regimen will provide 1550 kcal (74% kcal need), 83 grams protein (79% protein need), and 1149 ml free water.   NUTRITION DIAGNOSIS:   Moderate Malnutrition related to chronic illness, cancer and cancer related treatments as evidenced by mild fat depletion, moderate muscle depletion. -revised, ongoing  GOAL:   Patient will meet greater than or equal to 90% of their needs -unmet  MONITOR:   PO intake, Supplement acceptance, Labs, Weight trends, Skin   ASSESSMENT:   88 yo male with medical history of CKD, HTN, heart failure, dementia, and prostate cancer. Of note, he was hospitalized in 08/2021 with sepsis and AKI on CKD 2/2 obstruction related to metastatic prostate cancer. Patient was admitted for septic shock 2/2 UTI.  No meal intake percentages documented after breakfast yesterday. SLP saw patient this AM for bedside evaluation and her note indicates that patient does not like Nepro and would only consume a very small amount of it prior to completely refusing it.   Patient is noted to be a/o to self only. He was laying in bed with eyes closed with no visitors present at the time of RD visit. Patient did not awake to name call x2, shoulder rub x1, hand rub x1, or during NFPE completion.   Able to talk with Dr. Posey Pronto briefly in person. IR unable to place PEG and Dr. Posey Pronto reached out to Surgery earlier this AM about  possible G-tube placement. Awaiting further information concerning this.   Weight has been fairly stable since admission.    Labs reviewed; Cl: 115 mmol/l, creatinine: 1.74 mg/dl, Ca: 7.8 mg/dl, GFR: 38 ml/min.  Medications reviewed.  IVF; D5-20 mEq KCl @ 50 ml/hr (204 kcal/24 hrs).    NUTRITION - FOCUSED PHYSICAL EXAM:  Flowsheet Row Most Recent Value  Orbital Region Mild depletion  Upper Arm Region Moderate depletion  Thoracic and Lumbar Region Unable to assess  Buccal Region Mild depletion  Temple Region Moderate depletion  Clavicle Bone Region Moderate depletion  Clavicle and Acromion Bone Region Moderate depletion  Scapular Bone Region Unable to assess  Dorsal Hand No depletion  Patellar Region Moderate depletion  Anterior Thigh Region Moderate depletion  Posterior Calf Region Mild depletion  Edema (RD Assessment) Mild  [BLE]  Hair Reviewed  Eyes Unable to assess  Mouth Unable to assess  Skin Reviewed  Nails Reviewed       Diet Order:   Diet Order             DIET - DYS 1 Room service appropriate? No; Fluid consistency: Nectar Thick  Diet effective now                   EDUCATION NEEDS:   Not appropriate for education at this time  Skin:  Skin Assessment: Skin Integrity Issues: Skin Integrity Issues:: DTI DTI: sacrum  Last BM:  1/3 (Type 6, medium amount)  Height:  Ht Readings from Last 1 Encounters:  11/03/21 5\' 9"  (1.753 m)    Weight:   Wt Readings from Last 1 Encounters:  11/16/21 75.9 kg     Estimated Nutritional Needs:  Kcal:  2100-2300 kcal Protein:  105-120 grams Fluid:  >/= 2.3 L/day     Jarome Matin, MS, RD, LDN Inpatient Clinical Dietitian RD pager # available in [place]  After hours/weekend pager # available in [facility]

## 2021-11-16 NOTE — Progress Notes (Signed)
Speech Language Pathology Treatment: Dysphagia  Patient Details Name: Anthony Dudley MRN: [id number] DOB: 19-Mar-1933 Today's Date: 11/16/2021 Time: 0712-1975 SLP Time Calculation (min) (ACUTE ONLY): 14 min  Assessment / Plan / Recommendation Clinical Impression  Patient seen to address SLP dysphagia goals. When arrived to room, pt asleep but did awaken.  His speech is severely dysarthric and he moaned when [facility] was elevated - thus this limited optimal positioning.  SLP did assist pt to position head in neutral position as best able as he tends to extend neck *c/w dementia.  He accepted intake of nepro, orange juice- nectar, single small bolus of grits and pineapple. Prolonged bolus manipulation/oral holding observed with purees - despite cues to swallow.  Liquid wash assisted to aid oral clearance.   Swallow is much more efficient with liquids vs solids/purees and  no indication of airway compromise with nectar liquids via straw..  His disliked the Nepro and refused more after single bolus.  SLP recommends to maximize liquid nutrition for efficiency. His dysarthria and oral dysphagia prevent him from being appropriate to consume solids requiring mastication.    HPI HPI: 88 year old gentleman with PMH significant for CKD, hypertension, GERD, CHF, dementia and prostate cancer. He was hospitalized at [facility] [place] hospital in 10/22, surgery with urology 12/15 - now w/ Urinary retention, CT imaging, ascites, left lower lobe.  Pt possibly aspirated with dyspnea and speech/swallow eval ordered.  During October hospital admission, pt had a short term feeding tube placed. He remains full code - and was placed on dys1/nectar diet. 12/29 underwent cystoscopy, bilateral ureteral stent change, and placement of suprapubic tube .      SLP Plan  Continue with current plan of care      Recommendations for follow up therapy are one component of a multi-disciplinary discharge planning process, led by the attending physician.   Recommendations may be updated based on patient status, additional functional criteria and insurance authorization.    Recommendations  Diet recommendations: Dysphagia 1 (puree);Nectar-thick liquid Liquids provided via: Straw;Cup Medication Administration: Crushed with puree Compensations: Minimize environmental distractions Postural Changes and/or Swallow Maneuvers: Seated upright 90 degrees;Upright 30-60 min after meal                Oral Care Recommendations: Oral care BID Follow Up Recommendations: Follow physician's recommendations for discharge plan and follow up therapies Assistance recommended at discharge: Frequent or constant Supervision/Assistance SLP Visit Diagnosis: Dysphagia, oropharyngeal phase (R13.12) Plan: Continue with current plan of care        Kathleen Lime, MS [place] Office [phone] Pager [phone]    Macario Golds  11/16/2021, 8:56 AM

## 2021-11-16 NOTE — Progress Notes (Signed)
Consent for PEG placement obtained from brother Gabriel Carina with second nurse witness - Marijean Bravo RN due to patients dementia.  Consent placed on chart

## 2021-11-16 NOTE — Progress Notes (Addendum)
Progress Note   Patient: Anthony Dudley DDU:[id number] DOB: 05-26-1933 DOA: 11/03/2021     13 DOS: the patient was seen and examined on 11/16/2021   Brief hospital course: 88 yo with CKD, HTN, HF, dementia, prostate cancer who was hospitalized on October with sepsis and AKI on CKD 2/2 obstruction related to metastatic prostate cancer.  He's being treated at this hospitalization for septic shock 2/2 UTI.  He was initially managed in the ICU with need for pressors.  He's been transferred to the hospitalist service on 12/25.  He's encephalopathic, currently covering for UTI and possible pneumonia.   12/29 underwent cystoscopy, bilateral ureteral stent exchange, placement of suprapubic tube.  EEG negative. 1/2 failed calorie count, recommendation is for feeding tube placement.  IR unable to place a feeding tube.  Will consult general surgery.   Assessment and Plan * Septic shock due to urinary tract infection ([place])- (present on admission) Met SIRS criteria on presentation febrile, tachycardic. Admitted to ICU requiring pressors.  Secondary to UTI. Cefepime 12/21-12/26. Transition to zosyn 12/27 with AMS, Last day of antibiotic 1/1. Urine cx with multiple species. Per previous notes, prior cx with MDR proteus sensitive to cefepime Blood cx NG CT abd pelvis with mild bilateral hydro (decreased from prior) - bilateral ureteral stents Underwent cystoscopy, stent exchange as well as suprapubic catheter placement. Currently resolved.  Acute metabolic encephalopathy- (present on admission) Waxing and waning mentation Multifactorial, infection, delirium, deconditioning. Delirium precautions, avoid psychotropic medication Per family seems worse than his baseline  B12 wnl, VBG without hypercarbia, folate wnl, TSH mildly elevated, free T4 also mildly elevated. CT head unremarkable. EEG negative for any acute seizure Check cosyntropin stimulation test.  Chronic retention of urine- (present on  admission) 10/5 underwent cystoscopy and fulguration of bladder. 10/6 bilateral PCN 10/18 replacement of bilateral nephrostomy tube and stent placement 10/24 bilateral PCN removal. 12/29 underwent cystoscopy, bilateral ureteral stent exchange and placement of suprapubic tube.  chronic urinary retention for multiple years and has been catheter dependent. Also has bilateral ureteral obstruction managed with bilateral ureteral stents placed antegrade. Outpatient, he was noted to have urethral erosion and is scheduled SP tube placement along with exchange of his bilateral ureteral stents on Thursday. Presented to urology office for a routine catheter change and urine culture in preparation for his surgery from where he was sent to ER for septic shock  Prostate cancer metastatic to multiple sites [facility])- (present on admission) PSA 2,268 10/22 Follows with urology, extensive bone mets. He has remained on ADT with this re-dosed in November and was recently approved to begin enzalutamide at his nursing facility. Outpatient management urology.   Aspiration pneumonia ([place])- (present on admission) CXR 12/26 with developing patchy infiltrates Completed antibiotic treatment course.  Dysphagia SLP eval, dysphagia 1 nectar thick Failed calorie count. POA brother would like to pursue PEG tube placement if he fails calorie count. IR has recommended surgical consult for PEG.  Macrocytic anemia- (present on admission) At baseline hemoglobin around 8. On presentation hemoglobin was 7.2. SP 1 PRBC transfusion transfuse for hemoglobin less than 7.   Brother has provided consent.  Acute renal failure superimposed on stage 3b chronic kidney disease ([place])- (present on admission) Secondary to obstructive uropathy, sepsis with ATN as well as dehydration. Baseline creatinine 2.2, peaked at 6.08 here, improving Along with hyperchloremic metabolic acidosis, hyponatremia, hypokalemia,  hypomagnesemia. Currently receiving D5 potassium for correction.  Sinus tachycardia- (present on admission) Unclear etiology. TSH 9.58 and free T4 0.72 on 12/30. No other  acute etiology of tachycardia identified. Currently treating with IV Lopressor as needed. asymptomatic.  Essential hypertension- (present on admission) Blood pressure stable without any medication.  Monitor.  Hypoalbuminemia- (present on admission) Responsible for third spacing.  Will monitor.  Goals of care, counseling/discussion Discussed code status with brother Anthony Dudley, he desires to continue full code based on his conversation with Pt in the past. Would be interested in feeding tube if needed.  Understands the risk of aspiration with the PEG tube placement as well as general risk of aspiration with patient's dysphagia. Palliative care has been consulted in the past with similar recommendation.  Lower extremity edema- (present on admission) LE [place] negative for DVT on L  Adult failure to thrive- (present on admission) Nutrition Problem: Moderate Malnutrition Etiology: chronic illness, cancer and cancer related treatments Nutrition Interventions: Interventions: MVI, Prostat, Hormel Shake   Placing the patient at high risk of poor outcome. Consult general surgery for PEG tube placement.,  Appreciate assistance.  Pressure ulcer- (present on admission) Mid sacral area, present on admission.  Continue foam dressing.  Pressure Injury 11/07/21 Sacrum Mid Deep Tissue Pressure Injury - Purple or maroon localized area of discolored intact skin or blood-filled blister due to damage of underlying soft tissue from pressure and/or shear. (Active)  11/07/21 1600  Location: Sacrum  Location Orientation: Mid  Staging: Deep Tissue Pressure Injury - Purple or maroon localized area of discolored intact skin or blood-filled blister due to damage of underlying soft tissue from pressure and/or shear.  Wound Description (Comments):    Present on Admission: Yes        Subjective: No nausea no vomiting no fever no chills.  No chest pain abdominal pain.  Objective Vital signs were reviewed and unremarkable. General: Appear in mild distress, no Rash; Oral Mucosa Clear, moist. no Abnormal Neck Mass Or lumps, Conjunctiva normal  Cardiovascular: S1 and S2 Present, no Murmur, Respiratory: good respiratory effort, Bilateral Air entry present and CTA, no Crackles, no wheezes Abdomen: Bowel Sound present, Soft and no tenderness Extremities: no Pedal edema Neurology: Drowsy but arousable, and oriented to self.  Not able to follow any commands today. affect appropriate. no new focal deficit Gait not checked due to patient safety concerns   Data Reviewed: My review of labs, imaging, notes and other tests shows no new significant findings.   Family Communication: Discussed with brother on 1/2.  Disposition: Status is: Inpatient  Remains inpatient appropriate because: Poor p.o. intake, and will be undergoing PEG tube placement.     Time spent: 35 minutes  Author: Berle Mull 11/16/2021 9:08 PM  For on call review [email or web address].

## 2021-11-16 NOTE — Anesthesia Preprocedure Evaluation (Deleted)
Anesthesia Evaluation    Reviewed: Allergy & Precautions, Patient's Chart, lab work & pertinent test results, reviewed documented beta blocker date and time   History of Anesthesia Complications Negative for: history of anesthetic complications  Airway        Dental   Pulmonary neg pulmonary ROS,           Cardiovascular hypertension, Pt. on home beta blockers and Pt. on medications +CHF  + dysrhythmias Supra Ventricular Tachycardia   TTE 08/2021: EF 55-60%, mild LVH, valves ok    Neuro/Psych Anxiety Depression Dementia negative neurological ROS     GI/Hepatic Neg liver ROS, GERD  Medicated and Controlled,  Endo/Other  negative endocrine ROS  Renal/GU Renal InsufficiencyRenal disease (Cr 1.74)  negative genitourinary   Musculoskeletal negative musculoskeletal ROS (+)   Abdominal   Peds  Hematology  (+) anemia , Hgb 8.2   Anesthesia Other Findings metastatic prostate cancer, admitted 11/11/21 with septic shock secondary to UTI and possible pneumonia, failure to thrive  Reproductive/Obstetrics negative OB ROS                             Anesthesia Physical Anesthesia Plan  ASA: 3  Anesthesia Plan: General   Post-op Pain Management: Ofirmev IV (intra-op)   Induction: Intravenous  PONV Risk Score and Plan: 3 and Treatment may vary due to age or medical condition, Ondansetron and Dexamethasone  Airway Management Planned: Oral ETT  Additional Equipment: None  Intra-op Plan:   Post-operative Plan: Extubation in OR  Informed Consent:   Plan Discussed with:   Anesthesia Plan Comments:         Anesthesia Quick Evaluation

## 2021-11-16 NOTE — Progress Notes (Signed)
Assumed care of patient and report received from Russ Halo, RN. Agree with previously documented assessment. Will continue plan of care.

## 2021-11-16 NOTE — Consult Note (Signed)
[facility] Surgery Consult Note  Anthony Dudley 1932/11/26  [id number].    Requesting MD: Berle Mull Chief Complaint/Reason for Consult: G tube  HPI:  Anthony Dudley is an 88yo male PMH metastatic prostate cancer, CHF, CKD, and dementia who was admitted to [facility] 11/11/21 with septic shock secondary to UTI and possible pneumonia. He completed antibiotics and has improved from this standpoint. 11/11/21 he underwent cystoscopy, bilateral ureteral stent exchange, placement of suprapubic tube. General surgery has been asked to see for consideration of G tube placement. He is on a dysphagia 1 diet but failed a calorie count. IR unable to place feeding tube due to anatomy.  At time of my exam patient is at baseline mentation per nursing - dysarthric, responds to voice and touch with groans and garbled words.  Abdominal surgical history: suprapubic catheter placement Anticoagulants: none  Review of Systems  Unable to perform ROS: Dementia    History reviewed. No pertinent family history.  Past Medical History:  Diagnosis Date   Adult failure to thrive    Anemia    Anxiety    Cancer ([place])    metastatic prostate cancer   CHF (congestive heart failure) (HCC)    Chronic kidney disease    stage 3   Dementia (HCC)    Depression    Dysphagia    Foley catheter in place    GERD (gastroesophageal reflux disease)    Hematuria    History of blood transfusion 08/20/2021   Hypernatremia    Hyponatremia    hx of   Malnutrition (HCC)    Obstructive and reflux uropathy    Physical debility    Severe sepsis with septic shock (CODE) (HCC)    hx of   SVT (supraventricular tachycardia) (HCC)    hx of   Urethral obstruction    bilateral   Urinary tract infection    hx of    Past Surgical History:  Procedure Laterality Date   CYSTOSCOPY W/ URETERAL STENT PLACEMENT Bilateral 11/11/2021   Procedure: CYSTOSCOPY WITH STENT  CHANGE/ SUPRO PUBIC TUBE PLACEMENT;   Surgeon: Raynelle Bring, MD;  Location: [facility];  Service: Urology;  Laterality: Bilateral;   CYSTOSCOPY WITH RETROGRADE PYELOGRAM, URETEROSCOPY AND STENT PLACEMENT Bilateral 08/18/2021   Procedure: 1.  Cystoscopy 2.  Fulguration of bladder;  Surgeon: Raynelle Bring, MD;  Location: [place];  Service: Urology;  Laterality: Bilateral;   INSERTION OF SUPRAPUBIC CATHETER N/A 11/11/2021   Procedure: INSERTION OF SUPRAPUBIC CATHETER;  Surgeon: Raynelle Bring, MD;  Location: [facility];  Service: Urology;  Laterality: N/A;   IR NEPHRO TUBE REMOV/FL  09/06/2021   IR NEPHRO TUBE REMOV/FL  09/06/2021   IR NEPHROSTOMY PLACEMENT LEFT  08/19/2021   IR NEPHROSTOMY PLACEMENT RIGHT  08/19/2021   IR URETERAL STENT PLACEMENT EXISTING ACCESS LEFT  08/31/2021   IR URETERAL STENT PLACEMENT EXISTING ACCESS RIGHT  08/31/2021   TONSILECTOMY, ADENOIDECTOMY, BILATERAL MYRINGOTOMY AND TUBES      Social History:  reports that he has never smoked. He has never used smokeless tobacco. He reports that he does not drink alcohol and does not use drugs.  Allergies:  Allergies  Allergen Reactions   Solanum Lycopersicum [Tomato]     Patient reports he is not allergic to tomato and he eats them all the time    Medications Prior to Admission  Medication Sig Dispense Refill   abiraterone acetate (ZYTIGA) 250 MG tablet Take 1,000 mg by mouth daily.     Cholecalciferol (VITAMIN D-3)  125 MCG (5000 UT) TABS Take 5,000 Units by mouth daily.     famotidine (PEPCID) 20 MG tablet Take 20 mg by mouth daily.     folic acid (FOLVITE) 1 MG tablet Place 1 tablet (1 mg total) into feeding tube daily.     magnesium oxide (MAG-OX) 400 (240 Mg) MG tablet Take 1 tablet (400 mg total) by mouth 2 (two) times daily.     mirtazapine (REMERON) 7.5 MG tablet Take 7.5 mg by mouth at bedtime.     Multiple Vitamin (MULTIVITAMIN WITH MINERALS) TABS tablet Place 1 tablet into feeding tube daily.     Nutritional Supplements (FEEDING SUPPLEMENT, PROSOURCE TF,)  liquid Place 45 mLs into feeding tube daily.     oxybutynin (DITROPAN) 5 MG tablet Place 0.5 tablets (2.5 mg total) into feeding tube 2 (two) times daily.     Pollen Extracts (PROSTAT PO) Take 1 tablet by mouth in the morning and at bedtime.     vitamin B-12 (CYANOCOBALAMIN) 1000 MCG tablet Take 2,000 mcg by mouth daily.     acetaminophen (TYLENOL) 325 MG tablet Take 2 tablets (650 mg total) by mouth every 6 (six) hours as needed for mild pain or fever. (Patient not taking: Reported on 11/03/2021)     food thickener (SIMPLYTHICK, HONEY/LEVEL 3/MODERATELY THICK,) LIQD Take 1 packet by mouth as needed.     LORazepam (ATIVAN) 2 MG/ML injection Inject 0.25 mLs (0.5 mg total) into the vein every 6 (six) hours as needed for sedation or anxiety. (Patient not taking: Reported on 11/03/2021) 1 mL 0   metoprolol tartrate (LOPRESSOR) 5 MG/5ML SOLN injection Inject 5 mLs (5 mg total) into the vein every 5 (five) minutes as needed (sustained HR >503, hold for systolic BP less than 546). (Patient not taking: Reported on 11/03/2021) 15 mL    Nutritional Supplements (FEEDING SUPPLEMENT, OSMOLITE 1.2 CAL,) LIQD Place 1,000 mLs into feeding tube every 12 (twelve) hours. (Patient not taking: Reported on 11/03/2021)  0   Water For Irrigation, Sterile (FREE WATER) SOLN Place 125 mLs into feeding tube every 4 (four) hours.      Prior to Admission medications   Medication Sig Start Date End Date Taking? Authorizing Provider  abiraterone acetate (ZYTIGA) 250 MG tablet Take 1,000 mg by mouth daily. 10/28/21  Yes [provider]  Cholecalciferol (VITAMIN D-3) 125 MCG (5000 UT) TABS Take 5,000 Units by mouth daily.   Yes [provider]  famotidine (PEPCID) 20 MG tablet Take 20 mg by mouth daily. 10/12/21  Yes [provider]  folic acid (FOLVITE) 1 MG tablet Place 1 tablet (1 mg total) into feeding tube daily. 09/07/21  Yes Samella Parr, NP  magnesium oxide (MAG-OX) 400 (240 Mg) MG tablet Take  1 tablet (400 mg total) by mouth 2 (two) times daily. 09/06/21  Yes Samella Parr, NP  mirtazapine (REMERON) 7.5 MG tablet Take 7.5 mg by mouth at bedtime. 10/06/21  Yes [provider]  Multiple Vitamin (MULTIVITAMIN WITH MINERALS) TABS tablet Place 1 tablet into feeding tube daily. 09/07/21  Yes Samella Parr, NP  Nutritional Supplements (FEEDING SUPPLEMENT, PROSOURCE TF,) liquid Place 45 mLs into feeding tube daily. 09/07/21  Yes Samella Parr, NP  oxybutynin (DITROPAN) 5 MG tablet Place 0.5 tablets (2.5 mg total) into feeding tube 2 (two) times daily. 09/06/21  Yes Samella Parr, NP  Pollen Extracts (PROSTAT PO) Take 1 tablet by mouth in the morning and at bedtime.   Yes [provider]  vitamin B-12 (CYANOCOBALAMIN) 1000 MCG tablet Take 2,000 mcg by mouth daily.   Yes [provider]  acetaminophen (TYLENOL) 325 MG tablet Take 2 tablets (650 mg total) by mouth every 6 (six) hours as needed for mild pain or fever. Patient not taking: Reported on 11/03/2021 09/06/21   Samella Parr, NP  food thickener (SIMPLYTHICK, HONEY/LEVEL 3/MODERATELY THICK,) LIQD Take 1 packet by mouth as needed. 09/06/21   Samella Parr, NP  LORazepam (ATIVAN) 2 MG/ML injection Inject 0.25 mLs (0.5 mg total) into the vein every 6 (six) hours as needed for sedation or anxiety. Patient not taking: Reported on 11/03/2021 09/06/21   Samella Parr, NP  metoprolol tartrate (LOPRESSOR) 5 MG/5ML SOLN injection Inject 5 mLs (5 mg total) into the vein every 5 (five) minutes as needed (sustained HR >220, hold for systolic BP less than 254). Patient not taking: Reported on 11/03/2021 09/06/21   Samella Parr, NP  Nutritional Supplements (FEEDING SUPPLEMENT, OSMOLITE 1.2 CAL,) LIQD Place 1,000 mLs into feeding tube every 12 (twelve) hours. Patient not taking: Reported on 11/03/2021 09/06/21   Samella Parr, NP  Water For Irrigation, Sterile (FREE WATER) SOLN Place 125 mLs into  feeding tube every 4 (four) hours. 09/06/21   Samella Parr, NP    Blood pressure 123/60, pulse (!) 102, temperature 99.3 F (37.4 C), resp. rate 20, height 5\' 9"  (1.753 m), weight 75.9 kg, SpO2 97 %. Physical Exam: General:chronically ill appearing male who is laying in bed in NAD HEENT: head is normocephalic, atraumatic.  Ears and nose without any masses or lesions.  Mouth is pink and moist. Edentulous  Heart: regular, rate, and rhythm.  Normal s1,s2. No obvious murmurs, gallops, or rubs noted.  Palpable pedal pulses bilaterally  Lungs: CTAB, no wheezes, rhonchi, or rales noted.  Respiratory effort nonlabored on room air Abd: soft, NT, ND, no visible surgical scars MS: no BUE/BLE edema, calves soft and no edema Skin: warm and dry with no masses, lesions, or rashes Psych: baseline dementia  Results for orders placed or performed during the hospital encounter of 11/03/21 (from the past 48 hour(s))  Basic metabolic panel     Status: Abnormal   Collection Time: 11/15/21  4:41 AM  Result Value Ref Range   Sodium 142 135 - 145 mmol/L   Potassium 3.9 3.5 - 5.1 mmol/L   Chloride 117 (H) 98 - 111 mmol/L   CO2 19 (L) 22 - 32 mmol/L   Glucose, Bld 80 70 - 99 mg/dL    Comment: Glucose reference range applies only to samples taken after fasting for at least 8 hours.   BUN 19 8 - 23 mg/dL   Creatinine, Ser 1.81 (H) 0.61 - 1.24 mg/dL   Calcium 8.0 (L) 8.9 - 10.3 mg/dL   GFR, Estimated 36 (L) >60 mL/min    Comment: (NOTE) Calculated using the CKD-EPI Creatinine Equation (2021)    Anion gap 6 5 - 15    Comment: Performed at [facility], [place] [address], [place], [place] [address]  CBC     Status: Abnormal   Collection Time: 11/15/21  4:41 AM  Result Value Ref Range   WBC 6.6 4.0 - 10.5 K/uL   RBC 2.41 (L) 4.22 - 5.81 MIL/uL   Hemoglobin 7.7 (L) 13.0 - 17.0 g/dL   HCT 26.3 (L) 39.0 - 52.0 %   MCV 109.1 (H) 80.0 - 100.0 fL   MCH 32.0 26.0 - 34.0 pg  MCHC 29.3 (L) 30.0  - 36.0 g/dL   RDW 20.0 (H) 11.5 - 15.5 %   Platelets 204 150 - 400 K/uL   nRBC 0.0 0.0 - 0.2 %    Comment: Performed at [facility], [place] [address] [place], [place] [address]  Magnesium     Status: None   Collection Time: 11/15/21  4:41 AM  Result Value Ref Range   Magnesium 2.0 1.7 - 2.4 mg/dL    Comment: Performed at [facility], [place] [address] [place], [place] [address]  Basic metabolic panel     Status: Abnormal   Collection Time: 11/16/21  4:09 AM  Result Value Ref Range   Sodium 140 135 - 145 mmol/L   Potassium 3.7 3.5 - 5.1 mmol/L   Chloride 115 (H) 98 - 111 mmol/L   CO2 19 (L) 22 - 32 mmol/L   Glucose, Bld 102 (H) 70 - 99 mg/dL    Comment: Glucose reference range applies only to samples taken after fasting for at least 8 hours.   BUN 19 8 - 23 mg/dL   Creatinine, Ser 1.74 (H) 0.61 - 1.24 mg/dL   Calcium 7.8 (L) 8.9 - 10.3 mg/dL   GFR, Estimated 38 (L) >60 mL/min    Comment: (NOTE) Calculated using the CKD-EPI Creatinine Equation (2021)    Anion gap 6 5 - 15    Comment: Performed at [facility], [place] [address] [place], [place] [address]  CBC     Status: Abnormal   Collection Time: 11/16/21  4:09 AM  Result Value Ref Range   WBC 7.0 4.0 - 10.5 K/uL   RBC 2.52 (L) 4.22 - 5.81 MIL/uL   Hemoglobin 8.2 (L) 13.0 - 17.0 g/dL   HCT 25.9 (L) 39.0 - 52.0 %   MCV 102.8 (H) 80.0 - 100.0 fL   MCH 32.5 26.0 - 34.0 pg   MCHC 31.7 30.0 - 36.0 g/dL   RDW 19.1 (H) 11.5 - 15.5 %   Platelets 233 150 - 400 K/uL   nRBC 0.0 0.0 - 0.2 %    Comment: Performed at [facility], [place] [address], [place], [place] [address]  Magnesium     Status: None   Collection Time: 11/16/21  4:09 AM  Result Value Ref Range   Magnesium 1.7 1.7 - 2.4 mg/dL    Comment: Performed at [facility], [place] [address], [place], [place] [address]   No results found.    Assessment/Plan Prostate cancer metastatic  to multiple sites Dysphagia Failure to thrive - Patient with dysphagia, poor oral intake, and failure to thrive in the setting of metastatic prostate cancer. Family desires for aggressive treatment and would like to continue full scope of care. He cannot tolerate enough nutrition by mouth and general surgery has been asked to see for placement of G tube. IR unable to place due to anatomy. We can proceed with laparoscopic G tube placement.  Attempted to call brother Anthony Dudley at 2 numbers listed in chart withoutout answer to discuss possible G tube placement tomorrow. Left voicemail without patient identifying information to call hospital floor back.  Possible G tube placement tomorrow  ID - none VTE - per primary, ok for chemical dvt prophylaxis from surgical standpoint FEN - D1 diet, Nepro Shake TID and Prosource Plus BID, NPO MN Foley - SP tube  Septic shock due to UTI - improved, completed abx Aspiration PNA - completed abx Acute on  chronic renal failure Macrocytic anemia HTN Dementia  Moderate Medical Decision Making  Winferd Humphrey, [facility] Surgery 11/16/2021, 10:51 AM Please see Amion for pager number during day hours 7:00am-4:30pm

## 2021-11-17 ENCOUNTER — Encounter (HOSPITAL_COMMUNITY)
Admission: EM | Disposition: A | Discharge status: Skilled Nursing Facility | Payer: Self-pay | Source: Ambulatory Visit | Attending: Internal Medicine

## 2021-11-17 ENCOUNTER — Inpatient Hospital Stay (HOSPITAL_COMMUNITY): Payer: Medicare Other

## 2021-11-17 LAB — CBC WITH DIFFERENTIAL/PLATELET
Abs Immature Granulocytes: 0.1 10*3/uL — ABNORMAL HIGH (ref 0.00–0.07)
Basophils Absolute: 0 10*3/uL (ref 0.0–0.1)
Basophils Relative: 0 %
Eosinophils Absolute: 0.2 10*3/uL (ref 0.0–0.5)
Eosinophils Relative: 2 %
HCT: 25.8 % — ABNORMAL LOW (ref 39.0–52.0)
Hemoglobin: 7.8 g/dL — ABNORMAL LOW (ref 13.0–17.0)
Immature Granulocytes: 1 %
Lymphocytes Relative: 21 %
Lymphs Abs: 1.7 10*3/uL (ref 0.7–4.0)
MCH: 31.6 pg (ref 26.0–34.0)
MCHC: 30.2 g/dL (ref 30.0–36.0)
MCV: 104.5 fL — ABNORMAL HIGH (ref 80.0–100.0)
Monocytes Absolute: 0.6 10*3/uL (ref 0.1–1.0)
Monocytes Relative: 8 %
Neutro Abs: 5.2 10*3/uL (ref 1.7–7.7)
Neutrophils Relative %: 68 %
Platelets: 212 10*3/uL (ref 150–400)
RBC: 2.47 MIL/uL — ABNORMAL LOW (ref 4.22–5.81)
RDW: 19 % — ABNORMAL HIGH (ref 11.5–15.5)
WBC: 7.9 10*3/uL (ref 4.0–10.5)
nRBC: 0 % (ref 0.0–0.2)

## 2021-11-17 LAB — AMMONIA: Ammonia: 19 umol/L (ref 9–35)

## 2021-11-17 LAB — ACTH STIMULATION, 3 TIME POINTS
Cortisol, 30 Min: 11.9 ug/dL
Cortisol, 60 Min: 13.1 ug/dL
Cortisol, Base: 10.6 ug/dL

## 2021-11-17 LAB — COMPREHENSIVE METABOLIC PANEL
ALT: 13 U/L (ref 0–44)
AST: 18 U/L (ref 15–41)
Albumin: 1.9 g/dL — ABNORMAL LOW (ref 3.5–5.0)
Alkaline Phosphatase: 352 U/L — ABNORMAL HIGH (ref 38–126)
Anion gap: 6 (ref 5–15)
BUN: 15 mg/dL (ref 8–23)
CO2: 18 mmol/L — ABNORMAL LOW (ref 22–32)
Calcium: 7.7 mg/dL — ABNORMAL LOW (ref 8.9–10.3)
Chloride: 114 mmol/L — ABNORMAL HIGH (ref 98–111)
Creatinine, Ser: 1.5 mg/dL — ABNORMAL HIGH (ref 0.61–1.24)
GFR, Estimated: 45 mL/min — ABNORMAL LOW (ref >60)
Glucose, Bld: 99 mg/dL (ref 70–99)
Potassium: 3.5 mmol/L (ref 3.5–5.1)
Sodium: 138 mmol/L (ref 135–145)
Total Bilirubin: 0.4 mg/dL (ref 0.3–1.2)
Total Protein: 5.7 g/dL — ABNORMAL LOW (ref 6.5–8.1)

## 2021-11-17 LAB — TSH: TSH: 21.227 u[IU]/mL — ABNORMAL HIGH (ref 0.350–4.500)

## 2021-11-17 LAB — GLUCOSE, CAPILLARY
Glucose-Capillary: 103 mg/dL — ABNORMAL HIGH (ref 70–99)
Glucose-Capillary: 103 mg/dL — ABNORMAL HIGH (ref 70–99)
Glucose-Capillary: 128 mg/dL — ABNORMAL HIGH (ref 70–99)
Glucose-Capillary: 99 mg/dL (ref 70–99)

## 2021-11-17 LAB — LACTIC ACID, PLASMA: Lactic Acid, Venous: 1 mmol/L (ref 0.5–1.9)

## 2021-11-17 SURGERY — INSERTION, GASTROSTOMY TUBE, PERCUTANEOUS
Anesthesia: Choice

## 2021-11-17 SURGERY — INSERTION, PEG TUBE
Anesthesia: General

## 2021-11-17 MED ORDER — KCL IN DEXTROSE-NACL 40-5-0.9 MEQ/L-%-% IV SOLN
INTRAVENOUS | Status: DC
  Filled 2021-11-17 (×8): qty 1000

## 2021-11-17 MED ORDER — PIPERACILLIN-TAZOBACTAM 3.375 G IVPB
3.3750 g | Freq: Three times a day (TID) | INTRAVENOUS | Status: DC
  Administered 2021-11-17 – 2021-11-19 (×6): 3.375 g via INTRAVENOUS
  Filled 2021-11-17 (×6): qty 50

## 2021-11-17 MED ORDER — PHENYLEPHRINE HCL (PRESSORS) 10 MG/ML IV SOLN
INTRAVENOUS | Status: AC
  Filled 2021-11-17: qty 1

## 2021-11-17 MED ORDER — SODIUM CHLORIDE 0.9 % IV BOLUS
250.0000 mL | Freq: Once | INTRAVENOUS | Status: AC
  Administered 2021-11-17: 250 mL via INTRAVENOUS

## 2021-11-17 MED ORDER — VANCOMYCIN HCL 1000 MG/200ML IV SOLN
1000.0000 mg | Freq: Every day | INTRAVENOUS | Status: DC
  Administered 2021-11-17 – 2021-11-18 (×2): 1000 mg via INTRAVENOUS
  Filled 2021-11-17 (×2): qty 200

## 2021-11-17 NOTE — Progress Notes (Signed)
Patients brother Suezanne Jacquet here this evening, patient more alert and conversing with him in the room - answering questions and asking brother and RN to "be quiet".  Attempted bite of apple sauce and offered liquids, but patient yelled "no, no,no" and unwilling to try at this time.  Will continue to monitor, brother states he should be available to come back tomorrow as needed.

## 2021-11-17 NOTE — Hospital Course (Signed)
88 year old male Known metastatic prostate cancer with chronic indwelling Foley CKD 3 Chronic bedbound status HFpEF SVT previously treated with amiodarone  Recent lengthy hospitalization 10/4 through 09/06/2021 secondary to sepsis-patient was found to have hydronephrosis underwent cystoscopy fulguration bilateral PCN and bilateral nephrostomy tube placement by IR 08/2017  Patient was readmitted by critical care service 11/03/2021-found to be pyrexia tachycardic and in urinary retention Received pressors and was sent to the ICU He was resuscitated and transferred to [facility] service 12/25 12/29 cystoscopy bilateral stent exchange and placement of suprapubic catheter Recommended dysphagia 1 diet but failed calorie count 1/3 General surgery consulted to place feeding tube as could not be placed by IR secondary to anatomy

## 2021-11-17 NOTE — Progress Notes (Addendum)
Late entry for 11/17/21 ~ 1800 pm  Patient seen reviewed-low BP after I rounded He remains encephalopathic likely 2/2 with aspirations I had extensive discussions with family [brother] I discussed he might be septic but its not showing up We will hold feeds of any enteral sort for now I held empiric antibiotics initially but will start broad-spectrum Vanco and Zosyn at this time and monitor His pressures have improved according to nursing on my reevaluation so we can keep him on regular floor  Verneita Griffes, MD [facility] 5:36 PM

## 2021-11-17 NOTE — Progress Notes (Signed)
Pharmacy Antibiotic Note  Anthony Dudley is a 88 y.o. male admitted on 11/03/2021 with AKI and sepsis; completed an extended course of cefepime/Zosyn on 1/2, now again encephalopathic with new low-grade fevers.  Pharmacy has been consulted for vancomycin and Zosyn dosing. AKI continues to resolve  Plan: Vancomycin 1000 mg IV q24 hr (est AUC 543 based on SCr 1.5; Vd 0.72) Measure vancomycin AUC at steady state as indicated SCr daily while on vanc Zosyn 3.375 g IV q8 hrs by 4-hr infusion    Height: 5\' 9"  (175.3 cm) Weight: 75.9 kg (167 lb 5.3 oz) IBW/kg (Calculated) : 70.7  Temp (24hrs), Avg:99.1 F (37.3 C), Min:98.2 F (36.8 C), Max:100.5 F (38.1 C)  Recent Labs  Lab 11/13/21 0537 11/14/21 0539 11/15/21 0441 11/16/21 0409 11/17/21 1009 11/17/21 1145  WBC 9.8 8.1 6.6 7.0 7.9  --   CREATININE 2.10* 1.95* 1.81* 1.74* 1.50*  --   LATICACIDVEN  --   --   --   --   --  1.0    Estimated Creatinine Clearance: 35.4 mL/min (A) (by C-G formula based on SCr of 1.5 mg/dL (H)).    Allergies  Allergen Reactions   Solanum Lycopersicum [Tomato]     Patient reports he is not allergic to tomato and he eats them all the time    Antimicrobials this admission: 12/21 Vancomycin / Flagyl x 1  12/21 Cefepime >> 12/27  12/26 flagyl >> 12/27  12/26 vanc >> 12/28  12/27 Zosyn >> 1/2  1/4 vancomycin >>  1/4 Zosyn >>   Dose adjustments this admission: n/a  Microbiology results: 12/21 BCx: NGF 12/21 UCx: mult sp-final 12/21 MRSA PCR: pos 1/4 BCx: sent 1/4 UCx: sent  Thank you for allowing pharmacy to be a part of this patients care.  Danylle Ouk A 11/17/2021 6:11 PM

## 2021-11-17 NOTE — Progress Notes (Signed)
Patient still unable to take anything PO this AM - patient not alert enough, will not follow commands.

## 2021-11-17 NOTE — Progress Notes (Signed)
°   11/17/21 1059  Assess: MEWS Score  Temp 99 F (37.2 C)  BP (!) 88/40  Pulse Rate 90  Level of Consciousness Responds to Voice  SpO2 95 %  O2 Device Room Air  Assess: MEWS Score  MEWS Temp 0  MEWS Systolic 1  MEWS Pulse 0  MEWS RR 0  MEWS LOC 1  MEWS Score 2  MEWS Score Color Yellow  Assess: if the MEWS score is Yellow or Red  Were vital signs taken at a resting state? Yes  Focused Assessment No change from prior assessment  Does the patient meet 2 or more of the SIRS criteria? No  MEWS guidelines implemented *See Row Information* Yes  Treat  MEWS Interventions Escalated (See documentation below);Administered scheduled meds/treatments  Pain Scale PAINAD  Pain Score 0  Take Vital Signs  Increase Vital Sign Frequency  Yellow: Q 2hr X 2 then Q 4hr X 2, if remains yellow, continue Q 4hrs  Escalate  MEWS: Escalate Yellow: discuss with charge nurse/RN and consider discussing with provider and RRT  Notify: Charge Nurse/RN  Name of Charge Nurse/RN Notified M Long RN  Date Charge Nurse/RN Notified 11/17/21  Time Charge Nurse/RN Notified 1100  Notify: Provider  Provider Name/Title Dr. Verlon Au  Date Provider Notified 11/17/21  Time Provider Notified 1143  Notification Type Face-to-face  Notification Reason Change in status  Provider response See new orders  Date of Provider Response 11/17/21  Time of Provider Response 1100  Assess: SIRS CRITERIA  SIRS Temperature  0  SIRS Pulse 0  SIRS Respirations  0  SIRS WBC 0  SIRS Score Sum  0   Patient Yellow MEWS d/t low BP, and mental status, MD and CN aware - will initiate new orders.

## 2021-11-17 NOTE — Progress Notes (Addendum)
PROGRESS NOTE   Anthony Dudley  JJO:[id number] DOB: 1933/06/21 DOA: 11/03/2021 PCP: [name], MD  Brief Narrative:   88 year old male Known metastatic prostate cancer with chronic indwelling Foley CKD 3 Chronic bedbound status HFpEF SVT previously treated with amiodarone  Recent lengthy hospitalization 10/4 through 09/06/2021 secondary to sepsis-patient was found to have hydronephrosis underwent cystoscopy fulguration bilateral PCN and bilateral nephrostomy tube placement by IR 08/2017  Patient was readmitted by critical care service 11/03/2021-found to be pyrexia tachycardic and in urinary retention Received pressors and was sent to the ICU He was resuscitated and transferred to [facility] 12/25 12/29 cystoscopy bilateral stent exchange and placement of suprapubic catheter Recommended dysphagia 1 diet but failed calorie count 1/3 General surgery consulted to place feeding tube as could not be placed by IR secondary to anatomy   Hospital-Problem based course  Sepsis from prior to admission likely combination of probable aspiration pneumonia and multidrug-resistant urinary infection [prior multidrug-resistant Proteus] Cultures negative from admission Received cefepime as well as Zosyn through 11/14/2021 total of 7 days Continue fluids 50 cc/h Acute metabolic encephalopathy present on admission Work-up including B12 TSH EEG CT all negative Cosyntropin testing creatinine Chronic urinary retention Prostate cancer PSA 2268 Underwent cystoscopy bilateral stent exchange and suprapubic 12/21 Continue enzalutamide and ADT per urology we will follow-up with Dr. Alinda Money outpatient Chronic dysphagia Risk for aspiration Was supposed to get PEG tube today but lethargic and ill-appearing and not safe for anesthesia Continue dysphagia 1 diet I will discuss with family goals of care Acute renal failure superimposed on CKD 3B on admission peak creatinine 6.0 Acidosis of renal  failure Creatinine has resolved, continue cautious D5 with K 30 cc at 50 cc/h  Volumes are net even Chronic sinus tachycardia Likely from underlying cancer state in addition to failure to thrive Continue Toprol-XL 25 daily   DVT prophylaxis: Foot pumps bilaterally Code Status: Full code confirmed with brother Family Communication: called and d/w brother Bentleigh Stankus  [phone]  and updated fully--Long discussion ensued-if patient is more awake alert can consider small bore feeding tube in the next 24 hours if not however we may have to consider alternate methods of feeding-patient's brother is absolutely resistant to palliative care and does not want to speak to them at this time I have mentioned to family member that this may be a terminal state for his brother if he does not come out of his delirium Disposition:  Status is: Inpatient  Remains inpatient appropriate because:     Consultants:  Urology  Procedures:   Antimicrobials:     Subjective:  Incoherent Cannot orient at all and is somnolent No distress  Objective: Vitals:   11/16/21 0334 11/16/21 1308 11/16/21 1957 11/17/21 0512  BP: 123/60 125/70 120/68 105/79  Pulse: (!) 102 100 (!) 106 100  Resp: 20 20 20 20   Temp: 99.3 F (37.4 C) 97.6 F (36.4 C) 99.6 F (37.6 C) (!) 100.5 F (38.1 C)  TempSrc:   Oral Oral  SpO2: 97% 100% 97% 96%  Weight:      Height:        Intake/Output Summary (Last 24 hours) at 11/17/2021 0946 Last data filed at 11/17/2021 0930 Gross per 24 hour  Intake 1582.62 ml  Output 625 ml  Net 957.62 ml   Filed Weights   11/14/21 0500 11/15/21 0500 11/16/21 0334  Weight: 79.5 kg 81.3 kg 75.9 kg    Examination:  Not arousable No icterus no pallor Cannot examine chest clearly Abd soft,  has Suprapubic in place  Some Le edema Power seems intact to withdrawal Psych cannot assess  Data Reviewed: personally reviewed   CBC    Component Value Date/Time   WBC 7.0 11/16/2021 0409    RBC 2.52 (L) 11/16/2021 0409   HGB 8.2 (L) 11/16/2021 0409   HCT 25.9 (L) 11/16/2021 0409   PLT 233 11/16/2021 0409   MCV 102.8 (H) 11/16/2021 0409   MCH 32.5 11/16/2021 0409   MCHC 31.7 11/16/2021 0409   RDW 19.1 (H) 11/16/2021 0409   LYMPHSABS 2.5 11/13/2021 0537   MONOABS 0.5 11/13/2021 0537   EOSABS 0.1 11/13/2021 0537   BASOSABS 0.0 11/13/2021 0537   CMP Latest Ref Rng & Units 11/16/2021 11/15/2021 11/14/2021  Glucose 70 - 99 mg/dL 102(H) 80 88  BUN 8 - 23 mg/dL 19 19 21   Creatinine 0.61 - 1.24 mg/dL 1.74(H) 1.81(H) 1.95(H)  Sodium 135 - 145 mmol/L 140 142 144  Potassium 3.5 - 5.1 mmol/L 3.7 3.9 3.5  Chloride 98 - 111 mmol/L 115(H) 117(H) 123(H)  CO2 22 - 32 mmol/L 19(L) 19(L) 18(L)  Calcium 8.9 - 10.3 mg/dL 7.8(L) 8.0(L) 7.7(L)  Total Protein 6.5 - 8.1 g/dL - - -  Total Bilirubin 0.3 - 1.2 mg/dL - - -  Alkaline Phos 38 - 126 U/L - - -  AST 15 - 41 U/L - - -  ALT 0 - 44 U/L - - -     Radiology Studies: DG CHEST PORT 1 VIEW  Result Date: 11/17/2021 CLINICAL DATA:  Pneumonia EXAM: PORTABLE CHEST 1 VIEW COMPARISON:  Previous studies including the examination of 11/09/2021 FINDINGS: There is poor inspiration. There are patchy infiltrates in left lung. There is improvement in aeration of left lung. There are no new focal infiltrates. Diffuse sclerotic changes in the bony structures may suggest sclerotic metastatic disease. IMPRESSION: There is slight improvement in aeration of left lung suggesting decrease in pneumonia. There are no new focal pulmonary infiltrates. There is no significant pleural effusion or pneumothorax. Electronically Signed   By: Elmer Picker M.D.   On: 11/17/2021 08:28     Scheduled Meds:  (feeding supplement) PROSource Plus  30 mL Oral BID BM   chlorhexidine  15 mL Mouth Rinse BID   Chlorhexidine Gluconate Cloth  6 each Topical Daily   mouth rinse  15 mL Mouth Rinse q12n4p   metoprolol succinate  25 mg Oral Daily   multivitamin with minerals  1  tablet Oral Daily   Continuous Infusions:  sodium chloride Stopped (11/04/21 0043)    ceFAZolin (ANCEF) IV Stopped (11/17/21 0930)   dextrose 5 % with kcl 50 mL/hr at 11/16/21 1507     LOS: 14 days   Time spent: [place], MD [facility] To contact the attending provider between 7A-7P or the covering provider during after hours 7P-7A, please log into the web site [email or web address] and access using universal [place] password for that web site. If you do not have the password, please call the hospital operator.  11/17/2021, 9:46 AM

## 2021-11-18 ENCOUNTER — Other Ambulatory Visit: Payer: Self-pay

## 2021-11-18 DIAGNOSIS — N2589 Other disorders resulting from impaired renal tubular function: Secondary | ICD-10-CM

## 2021-11-18 DIAGNOSIS — A419 Sepsis, unspecified organism: Secondary | ICD-10-CM | POA: Diagnosis not present

## 2021-11-18 DIAGNOSIS — G9341 Metabolic encephalopathy: Secondary | ICD-10-CM | POA: Diagnosis not present

## 2021-11-18 DIAGNOSIS — R6521 Severe sepsis with septic shock: Secondary | ICD-10-CM | POA: Diagnosis not present

## 2021-11-18 DIAGNOSIS — N390 Urinary tract infection, site not specified: Secondary | ICD-10-CM | POA: Diagnosis not present

## 2021-11-18 LAB — CBC WITH DIFFERENTIAL/PLATELET
Abs Immature Granulocytes: 0.13 10*3/uL — ABNORMAL HIGH (ref 0.00–0.07)
Basophils Absolute: 0 10*3/uL (ref 0.0–0.1)
Basophils Relative: 0 %
Eosinophils Absolute: 0.2 10*3/uL (ref 0.0–0.5)
Eosinophils Relative: 3 %
HCT: 20.7 % — ABNORMAL LOW (ref 39.0–52.0)
Hemoglobin: 6.5 g/dL — CL (ref 13.0–17.0)
Immature Granulocytes: 2 %
Lymphocytes Relative: 24 %
Lymphs Abs: 1.7 10*3/uL (ref 0.7–4.0)
MCH: 32.5 pg (ref 26.0–34.0)
MCHC: 31.4 g/dL (ref 30.0–36.0)
MCV: 103.5 fL — ABNORMAL HIGH (ref 80.0–100.0)
Monocytes Absolute: 0.6 10*3/uL (ref 0.1–1.0)
Monocytes Relative: 9 %
Neutro Abs: 4.3 10*3/uL (ref 1.7–7.7)
Neutrophils Relative %: 62 %
Platelets: 205 10*3/uL (ref 150–400)
RBC: 2 MIL/uL — ABNORMAL LOW (ref 4.22–5.81)
RDW: 19 % — ABNORMAL HIGH (ref 11.5–15.5)
WBC: 6.9 10*3/uL (ref 4.0–10.5)
nRBC: 0 % (ref 0.0–0.2)

## 2021-11-18 LAB — URINE CULTURE: Culture: NO GROWTH

## 2021-11-18 LAB — OCCULT BLOOD X 1 CARD TO LAB, STOOL: Fecal Occult Bld: POSITIVE — AB

## 2021-11-18 LAB — PREPARE RBC (CROSSMATCH)

## 2021-11-18 LAB — GLUCOSE, CAPILLARY
Glucose-Capillary: 105 mg/dL — ABNORMAL HIGH (ref 70–99)
Glucose-Capillary: 79 mg/dL (ref 70–99)
Glucose-Capillary: 81 mg/dL (ref 70–99)
Glucose-Capillary: 93 mg/dL (ref 70–99)

## 2021-11-18 LAB — COMPREHENSIVE METABOLIC PANEL
ALT: 11 U/L (ref 0–44)
AST: 17 U/L (ref 15–41)
Albumin: 1.7 g/dL — ABNORMAL LOW (ref 3.5–5.0)
Alkaline Phosphatase: 302 U/L — ABNORMAL HIGH (ref 38–126)
Anion gap: 6 (ref 5–15)
BUN: 12 mg/dL (ref 8–23)
CO2: 17 mmol/L — ABNORMAL LOW (ref 22–32)
Calcium: 7.5 mg/dL — ABNORMAL LOW (ref 8.9–10.3)
Chloride: 118 mmol/L — ABNORMAL HIGH (ref 98–111)
Creatinine, Ser: 1.4 mg/dL — ABNORMAL HIGH (ref 0.61–1.24)
GFR, Estimated: 49 mL/min — ABNORMAL LOW (ref >60)
Glucose, Bld: 102 mg/dL — ABNORMAL HIGH (ref 70–99)
Potassium: 3.7 mmol/L (ref 3.5–5.1)
Sodium: 141 mmol/L (ref 135–145)
Total Bilirubin: 0.4 mg/dL (ref 0.3–1.2)
Total Protein: 5.1 g/dL — ABNORMAL LOW (ref 6.5–8.1)

## 2021-11-18 LAB — LACTIC ACID, PLASMA: Lactic Acid, Venous: 0.9 mmol/L (ref 0.5–1.9)

## 2021-11-18 LAB — PHOSPHORUS: Phosphorus: 2.8 mg/dL (ref 2.5–4.6)

## 2021-11-18 LAB — MAGNESIUM: Magnesium: 1.7 mg/dL (ref 1.7–2.4)

## 2021-11-18 MED ORDER — OSMOLITE 1.5 CAL PO LIQD
1000.0000 mL | ORAL | Status: DC
  Filled 2021-11-18 (×2): qty 1000

## 2021-11-18 MED ORDER — VANCOMYCIN HCL 1000 MG/200ML IV SOLN: 1000.0000 mg | Freq: Every day | INTRAVENOUS | Status: DC

## 2021-11-18 MED ORDER — SODIUM CHLORIDE 0.9% IV SOLUTION: Freq: Once | INTRAVENOUS | Status: DC

## 2021-11-18 MED ORDER — LACTATED RINGERS IV BOLUS
250.0000 mL | Freq: Once | INTRAVENOUS | Status: AC
  Administered 2021-11-18: 250 mL via INTRAVENOUS

## 2021-11-18 MED ORDER — FREE WATER: 100.0000 mL | Freq: Four times a day (QID) | Status: DC

## 2021-11-18 MED ORDER — SODIUM CHLORIDE 0.9 % IV BOLUS
250.0000 mL | Freq: Once | INTRAVENOUS | Status: AC
  Administered 2021-11-18: 250 mL via INTRAVENOUS

## 2021-11-18 MED ORDER — VITAL HIGH PROTEIN PO LIQD
1000.0000 mL | ORAL | Status: DC
  Filled 2021-11-18: qty 1000

## 2021-11-18 MED ORDER — PROSOURCE TF PO LIQD
45.0000 mL | Freq: Two times a day (BID) | ORAL | Status: DC
  Filled 2021-11-18 (×2): qty 45

## 2021-11-18 NOTE — Progress Notes (Signed)
NUTRITION NOTE  Consult received for TF initiation and management.   Able to communicate with MD via secure chat. Plan is for small bore NGT placement at bedside today and initiation of TF.  IR deemed patient not a candidate for PEG due to anatomy. General Surgery consulted for G-tube placement and note from today states that patient is not a candidate at the moment due to intermittent hypotension and tachycardia and he was anemic this AM.   Will order Osmolite 1.5 @ 20 ml/hr x14 hours/day (1800-0800) to increase by 10 ml every 14 hours of run time to reach goal rate of 70 ml/hr with 45 ml Prosource TF BID and 100 ml free water QID.   At goal rate, this regimen will provide 1550 kcal (74% kcal need), 83 grams protein (79% protein need), and 1149 ml free water.  Patient remains on Soft diet. If PO intakes remain <25%, will change TF regimen to be over 24 hours/day and to meet >/= 90% estimated needs.   Estimated Nutritional Needs:  Kcal:  2100-2300 kcal Protein:  105-120 grams Fluid:  >/= 2.3 L/day       Jarome Matin, MS, RD, LDN Inpatient Clinical Dietitian RD pager # available in [place]  After hours/weekend pager # available in [facility]

## 2021-11-18 NOTE — Progress Notes (Signed)
BP improved after 261ml bolus   11/18/21 0208  Vitals  BP (!) 100/50  MAP (mmHg) 65  BP Method Automatic  Pulse Rate 86  MEWS Score  MEWS Temp 0  MEWS Systolic 1  MEWS Pulse 0  MEWS RR 0  MEWS LOC 2  MEWS Score 3  MEWS Score Color Yellow

## 2021-11-18 NOTE — Progress Notes (Signed)
Pt BP 78/46 with MAP 51. VS rechecked and data below. Pt responds to pain and heavy tactile stimuli. MD paged for interventions. mIVF infusing at 141ml/hr.    11/17/21 2358  Vitals  Temp 97.7 F (36.5 C)  BP (!) 97/45  MAP (mmHg) (!) 61  BP Location Left Arm  BP Method Automatic  Patient Position (if appropriate) Lying  Pulse Rate 77  Pulse Rate Source Monitor  Resp 18  Oxygen Therapy  SpO2 92 %  O2 Device Room Air  MEWS Score  MEWS Temp 0  MEWS Systolic 1  MEWS Pulse 0  MEWS RR 0  MEWS LOC 1  MEWS Score 2  MEWS Score Color Yellow

## 2021-11-18 NOTE — Progress Notes (Signed)
Upon completion of blood transfusion pt's IV was flushed with NS to begin infusing his ordered IV fluids and IV antibiotics. No signs of leaking prior to flush, but upon flushing IV leaked significantly. Removed dressing, manipulated gently while attempting to flush. IV not patent. Removed. Pt's second IV line still in place but unable to flush. Left in place for IV team to evaluate. Entered IV team consult for difficult IV stick in light of pt's edema and past difficulty with IV starts. Will await response. [place] Breeze, RN 11/18/2021

## 2021-11-18 NOTE — Progress Notes (Addendum)
PROGRESS NOTE   Anthony Dudley  QMG:[id number] DOB: 1933/02/24 DOA: 11/03/2021 PCP: [name], MD  Brief Narrative:   88 year old male Known metastatic prostate cancer with chronic indwelling Foley CKD 3 Chronic bedbound status HFpEF SVT previously treated with amiodarone  Recent lengthy hospitalization 10/4 through 09/06/2021 secondary to sepsis-patient was found to have hydronephrosis underwent cystoscopy fulguration bilateral PCN and bilateral nephrostomy tube placement by IR 08/2017  Patient was readmitted by critical care service 11/03/2021-found to be pyrexia tachycardic and in urinary retention Received pressors and was sent to the ICU He was resuscitated and transferred to [facility] 12/25 12/29 cystoscopy bilateral stent exchange and placement of suprapubic catheter Recommended dysphagia 1 diet but failed calorie count 1/3 General surgery consulted to place feeding tube as could not be placed by IR secondary to anatomy   Hospital-Problem based course  Sepsis from prior to admission likely combination of probable aspiration pneumonia and multidrug-resistant urinary infection [prior multidrug-resistant Proteus] Cultures negative from admission Received multiple boluses of fluid on 1/4-Continue fluids  Received cefepime as well as Zosyn through 11/14/2021 total of 7 days Re-started Vanc/Zosyn for possible infection--follow blood and urine cult from 01/13/66 Acute metabolic encephalopathy present on admission Work-up including B12 TSH EEG CT all negative Cosyntropin testing creatinine Chronic urinary retention Prostate cancer PSA 2268 Underwent cystoscopy bilateral stent exchange and suprapubic 12/21 Continue enzalutamide and ADT per urology we will follow-up with Dr. Alinda Money outpatient Anemia ? Cause Transfused 1 U on admit Transfuse 1 more unit 1/5  Get hemoccult Iron studies in am Hemoccult is +, poor candidate at this stage for any scope Chronic  dysphagia Risk for aspiration Was supposed to get PEG tube today but lethargic and ill-appearing and not safe for anesthesia Continue dysphagia 1 diet  Family wants all masures taken --get Feeding tube in today and start tube feeds Gen surg will re-eval in the am for possible PEG tube Acute renal failure superimposed on CKD 3B on admission peak creatinine 6.0 Acidosis of renal failure  D5/NS with K 40 cc at 100 cc/h  Volumes are net even Chronic sinus tachycardia Likely from underlying cancer state in addition to failure to thrive Continue Toprol-XL 25 daily   DVT prophylaxis: Foot pumps bilaterally Code Status: Full code confirmed with brother Family Communication: called and d/w brother Anthony Dudley  [phone]  and updated fully on 1/5 He understands the risks  Disposition:  Status is: Inpatient  Remains inpatient appropriate because:     Consultants:  Urology  Procedures:   Antimicrobials:     Subjective:  More awake  Opening eyes No cp fever   Objective: Vitals:   11/18/21 0953 11/18/21 1010 11/18/21 1015 11/18/21 1200  BP: (!) 116/57 (!) 94/56  (!) 104/53  Pulse: 92 (!) 122 67 72  Resp: (!) 24 (!) 23  17  Temp: 97.9 F (36.6 C) 97.9 F (36.6 C)  98 F (36.7 C)  TempSrc: Oral Oral  Oral  SpO2: 100% 100%  100%  Weight:      Height:        Intake/Output Summary (Last 24 hours) at 11/18/2021 1403 Last data filed at 11/18/2021 1200 Gross per 24 hour  Intake 1699.13 ml  Output 750 ml  Net 949.13 ml    Filed Weights   11/14/21 0500 11/15/21 0500 11/16/21 0334  Weight: 79.5 kg 81.3 kg 75.9 kg    Examination:  Interactive No icterus no pallor Cannot examine chest clearly Abd soft, has Suprapubic in place  Some  Le edema Power seems intact  Psych cannot assess Refuses LE exam and sacral exam  Data Reviewed: personally reviewed   CBC    Component Value Date/Time   WBC 6.9 11/18/2021 0511   RBC 2.00 (L) 11/18/2021 0511   HGB 6.5 (LL)  11/18/2021 0511   HCT 20.7 (L) 11/18/2021 0511   PLT 205 11/18/2021 0511   MCV 103.5 (H) 11/18/2021 0511   MCH 32.5 11/18/2021 0511   MCHC 31.4 11/18/2021 0511   RDW 19.0 (H) 11/18/2021 0511   LYMPHSABS 1.7 11/18/2021 0511   MONOABS 0.6 11/18/2021 0511   EOSABS 0.2 11/18/2021 0511   BASOSABS 0.0 11/18/2021 0511   CMP Latest Ref Rng & Units 11/18/2021 11/17/2021 11/16/2021  Glucose 70 - 99 mg/dL 102(H) 99 102(H)  BUN 8 - 23 mg/dL 12 15 19   Creatinine 0.61 - 1.24 mg/dL 1.40(H) 1.50(H) 1.74(H)  Sodium 135 - 145 mmol/L 141 138 140  Potassium 3.5 - 5.1 mmol/L 3.7 3.5 3.7  Chloride 98 - 111 mmol/L 118(H) 114(H) 115(H)  CO2 22 - 32 mmol/L 17(L) 18(L) 19(L)  Calcium 8.9 - 10.3 mg/dL 7.5(L) 7.7(L) 7.8(L)  Total Protein 6.5 - 8.1 g/dL 5.1(L) 5.7(L) -  Total Bilirubin 0.3 - 1.2 mg/dL 0.4 0.4 -  Alkaline Phos 38 - 126 U/L 302(H) 352(H) -  AST 15 - 41 U/L 17 18 -  ALT 0 - 44 U/L 11 13 -     Radiology Studies: DG CHEST PORT 1 VIEW  Result Date: 11/17/2021 CLINICAL DATA:  Pneumonia EXAM: PORTABLE CHEST 1 VIEW COMPARISON:  Previous studies including the examination of 11/09/2021 FINDINGS: There is poor inspiration. There are patchy infiltrates in left lung. There is improvement in aeration of left lung. There are no new focal infiltrates. Diffuse sclerotic changes in the bony structures may suggest sclerotic metastatic disease. IMPRESSION: There is slight improvement in aeration of left lung suggesting decrease in pneumonia. There are no new focal pulmonary infiltrates. There is no significant pleural effusion or pneumothorax. Electronically Signed   By: Elmer Picker M.D.   On: 11/17/2021 08:28     Scheduled Meds:  (feeding supplement) PROSource Plus  30 mL Oral BID BM   sodium chloride   Intravenous Once   chlorhexidine  15 mL Mouth Rinse BID   Chlorhexidine Gluconate Cloth  6 each Topical Daily   mouth rinse  15 mL Mouth Rinse q12n4p   metoprolol succinate  25 mg Oral Daily   Continuous  Infusions:  sodium chloride Stopped (11/04/21 0043)   dextrose 5 % and 0.9 % NaCl with KCl 40 mEq/L 100 mL/hr at 11/17/21 1515   piperacillin-tazobactam (ZOSYN)  IV 3.375 g (11/18/21 0201)   vancomycin 1,000 mg (11/17/21 1837)     LOS: 15 days   Time spent: 34  Nita Sells, MD [facility] To contact the attending provider between 7A-7P or the covering provider during after hours 7P-7A, please log into the web site [email or web address] and access using universal [place] password for that web site. If you do not have the password, please call the hospital operator.  11/18/2021, 2:03 PM

## 2021-11-18 NOTE — Significant Event (Signed)
Rapid Response Event Note   Reason for Call :  Hypotension and tachycardia.  Initial Focused Assessment:  Patient in baseline status of mentation, alert to self only, responsive, follows commands. Extensive edema especially in bilateral upper extremities with obvious fluid shifts. BP right arm 72/46 manually; dynamap not reading arm pressures. Switched to right lower leg with dynamap and reading of 125/57. HR on telemetry 120-130's. Heart sounds irregular. Lung sounds clear upper lobes, diminished lower.  Interventions:  Manual BP and switched sites to monitor BP. Chart reviewed. Notified on call provider who came to bedside. Metoprolol given per orders once adequate BP determined.   Plan of Care:  Assessment consistent from previous documented reports. Continue current care plan, monitor vitals per MEWS protocol, patient to remain in current bed placement.   Event Summary:   MD Notified: Gershon Cull, NP Call Time: 2300 Arrival Time: 2305 End Time: [place], RN

## 2021-11-18 NOTE — Progress Notes (Addendum)
Date and time results received: 11/18/21 0612   Test: Chem  Critical Value: Hemoglobin 6.5  Name of Provider Notified: Clarene Essex   Orders Received? Or Actions Taken?:  Awaiting new orders. T&S expired 11/08/21  See new orders.

## 2021-11-18 NOTE — Progress Notes (Signed)
On chart review, patient has had intermittent hypotension and tachycardia, and is anemic this morning, with ongoing delirium. He is not currently medically appropriate for an elective G tube placement. General surgery will sign off for now, please re-consult as needed.

## 2021-11-18 NOTE — TOC Progression Note (Signed)
Transition of Care [facility]) - Progression Note    Patient Details  Name: Anthony Dudley MRN: [id number] Date of Birth: 1933-05-10  Transition of Care [facility]) CM/SW Contact  June Rode, Juliann Pulse, RN Phone Number: 11/18/2021, 2:45 PM  Clinical Narrative:  d/c plan to return back to [facility] once stable. Noted for re eval of PEG placement.     Expected Discharge Plan: Long Term Nursing Home Barriers to Discharge: Continued Medical Work up  Expected Discharge Plan and Services Expected Discharge Plan: [place]                                               Social Determinants of Health (SDOH) Interventions    Readmission Risk Interventions No flowsheet data found.

## 2021-11-19 ENCOUNTER — Inpatient Hospital Stay (HOSPITAL_COMMUNITY): Payer: Medicare Other

## 2021-11-19 ENCOUNTER — Inpatient Hospital Stay: Payer: Self-pay

## 2021-11-19 DIAGNOSIS — A419 Sepsis, unspecified organism: Secondary | ICD-10-CM | POA: Diagnosis not present

## 2021-11-19 DIAGNOSIS — N390 Urinary tract infection, site not specified: Secondary | ICD-10-CM | POA: Diagnosis not present

## 2021-11-19 DIAGNOSIS — G9341 Metabolic encephalopathy: Secondary | ICD-10-CM | POA: Diagnosis not present

## 2021-11-19 DIAGNOSIS — R6521 Severe sepsis with septic shock: Secondary | ICD-10-CM | POA: Diagnosis not present

## 2021-11-19 LAB — COMPREHENSIVE METABOLIC PANEL
ALT: 11 U/L (ref 0–44)
AST: 26 U/L (ref 15–41)
Albumin: 1.6 g/dL — ABNORMAL LOW (ref 3.5–5.0)
Alkaline Phosphatase: 289 U/L — ABNORMAL HIGH (ref 38–126)
Anion gap: 6 (ref 5–15)
BUN: 12 mg/dL (ref 8–23)
CO2: 15 mmol/L — ABNORMAL LOW (ref 22–32)
Calcium: 7.6 mg/dL — ABNORMAL LOW (ref 8.9–10.3)
Chloride: 120 mmol/L — ABNORMAL HIGH (ref 98–111)
Creatinine, Ser: 1.44 mg/dL — ABNORMAL HIGH (ref 0.61–1.24)
GFR, Estimated: 47 mL/min — ABNORMAL LOW (ref >60)
Glucose, Bld: 98 mg/dL (ref 70–99)
Potassium: 4.2 mmol/L (ref 3.5–5.1)
Sodium: 141 mmol/L (ref 135–145)
Total Bilirubin: 0.7 mg/dL (ref 0.3–1.2)
Total Protein: 5.1 g/dL — ABNORMAL LOW (ref 6.5–8.1)

## 2021-11-19 LAB — CBC WITH DIFFERENTIAL/PLATELET
Abs Immature Granulocytes: 0.09 10*3/uL — ABNORMAL HIGH (ref 0.00–0.07)
Basophils Absolute: 0 10*3/uL (ref 0.0–0.1)
Basophils Relative: 0 %
Eosinophils Absolute: 0.3 10*3/uL (ref 0.0–0.5)
Eosinophils Relative: 5 %
HCT: 27.3 % — ABNORMAL LOW (ref 39.0–52.0)
Hemoglobin: 8.3 g/dL — ABNORMAL LOW (ref 13.0–17.0)
Immature Granulocytes: 1 %
Lymphocytes Relative: 27 %
Lymphs Abs: 1.7 10*3/uL (ref 0.7–4.0)
MCH: 31.6 pg (ref 26.0–34.0)
MCHC: 30.4 g/dL (ref 30.0–36.0)
MCV: 103.8 fL — ABNORMAL HIGH (ref 80.0–100.0)
Monocytes Absolute: 0.6 10*3/uL (ref 0.1–1.0)
Monocytes Relative: 9 %
Neutro Abs: 3.6 10*3/uL (ref 1.7–7.7)
Neutrophils Relative %: 58 %
Platelets: 188 10*3/uL (ref 150–400)
RBC: 2.63 MIL/uL — ABNORMAL LOW (ref 4.22–5.81)
RDW: 22.1 % — ABNORMAL HIGH (ref 11.5–15.5)
WBC: 6.3 10*3/uL (ref 4.0–10.5)
nRBC: 0 % (ref 0.0–0.2)

## 2021-11-19 LAB — GLUCOSE, CAPILLARY
Glucose-Capillary: 104 mg/dL — ABNORMAL HIGH (ref 70–99)
Glucose-Capillary: 106 mg/dL — ABNORMAL HIGH (ref 70–99)
Glucose-Capillary: 108 mg/dL — ABNORMAL HIGH (ref 70–99)
Glucose-Capillary: 116 mg/dL — ABNORMAL HIGH (ref 70–99)

## 2021-11-19 LAB — TYPE AND SCREEN
ABO/RH(D): O POS
Antibody Screen: NEGATIVE
Unit division: 0

## 2021-11-19 LAB — PHOSPHORUS
Phosphorus: 2.8 mg/dL (ref 2.5–4.6)
Phosphorus: 2.5 mg/dL (ref 2.5–4.6)

## 2021-11-19 LAB — BPAM RBC
Blood Product Expiration Date: 202302012359
ISSUE DATE / TIME: 202301050945
Unit Type and Rh: 5100

## 2021-11-19 LAB — MAGNESIUM
Magnesium: 1.6 mg/dL — ABNORMAL LOW (ref 1.7–2.4)
Magnesium: 2.3 mg/dL (ref 1.7–2.4)

## 2021-11-19 MED ORDER — ZINC OXIDE 40 % EX OINT
TOPICAL_OINTMENT | CUTANEOUS | Status: DC
  Administered 2021-11-19 – 2021-11-25 (×5): 1 via TOPICAL
  Filled 2021-11-19 (×2): qty 57

## 2021-11-19 MED ORDER — ZINC OXIDE 40 % EX OINT: TOPICAL_OINTMENT | CUTANEOUS | Status: DC | PRN

## 2021-11-19 MED ORDER — ENSURE ENLIVE PO LIQD
237.0000 mL | Freq: Two times a day (BID) | ORAL | Status: DC
  Administered 2021-11-19 – 2021-11-22 (×4): 237 mL via ORAL

## 2021-11-19 MED ORDER — MAGNESIUM SULFATE 2 GM/50ML IV SOLN
2.0000 g | Freq: Once | INTRAVENOUS | Status: AC
  Administered 2021-11-19: 2 g via INTRAVENOUS
  Filled 2021-11-19: qty 50

## 2021-11-19 NOTE — Progress Notes (Signed)
NUTRITION NOTE  Secure chat message received from Pharmacist who shares that the plan is no longer to start tube feeding, rather, to start TPN.  No central access listed in LDA/on avatar. Consult placed this afternoon for IV team to place PICC today.   TPN to start 1/7. Surgery note from today states patient remains not a candidate for G-tube placement due to episodes of hypotension and anemia.   Patient is on Dysphagia 1, nectar-thick liquids diet. RN note from this AM states that patient is doing well with liquids today, especially sweeter liquids to include juice, milk, boost breeze, and that patient did consume prosource plus supplement.   RD will continue to follow per protocol.     Estimated Nutritional Needs:  Kcal:  2100-2300 kcal Protein:  105-120 grams Fluid:  >/= 2.3 L/day     Jarome Matin, MS, RD, LDN Inpatient Clinical Dietitian RD pager # available in [place]  After hours/weekend pager # available in [facility]

## 2021-11-19 NOTE — Progress Notes (Addendum)
IR was requested for G tube placement, anatomy not amendable for percutaneous G tube placement.   CT scan from 11/03/21 re-evaluated by Dr. Earleen Newport today, no window for percutaneous G tube placement.  Patient is scheduled for nasogastric feeding tube placement with diagnostic radiology today.   Please call radiology for further questions and concerns.  Armando Gang Abril Cappiello PA-C 11/19/2021 12:11 PM

## 2021-11-19 NOTE — Progress Notes (Signed)
Progress Note  8 Days Post-Op  Subjective: More alert this morning. Oriented to self and place. He knows he is in the hospital and states he is here because his brother wanted him to be here. He denies pain. We discuss his eating and he states he does not eat much because he isn't very hungry   Objective: Vital signs in last 24 hours: Temp:  [97.9 F (36.6 C)-98.8 F (37.1 C)] 97.9 F (36.6 C) (01/06 0505) Pulse Rate:  [70-138] 70 (01/06 0505) Resp:  [17-29] 20 (01/06 0505) BP: (72-127)/(46-68) 127/60 (01/06 0505) SpO2:  [100 %] 100 % (01/06 0505) Last BM Date: 11/18/21  Intake/Output from previous day: 01/05 0701 - 01/06 0700 In: 1326.7 [P.O.:680; I.V.:250; Blood:396.7] Out: 1700 [Urine:1700] Intake/Output this shift: Total I/O In: 715 [P.O.:715] Out: -   PE: General: chronically ill appearing male who is laying in bed in NAD HEENT: head is normocephalic, atraumatic.   Mouth is pink and moist. edentulous Heart: regular, rate, and rhythm. Palpable radial pulses  Lungs: Respiratory effort nonlabored on room air Abd: soft, NT, ND, +BS MSK: all 4 extremities are symmetrical with no cyanosis, clubbing, or edema. Skin: warm and dry Psych: A&Ox3 with an appropriate affect.    Lab Results:  Recent Labs    11/18/21 0511 11/19/21 0438  WBC 6.9 6.3  HGB 6.5* 8.3*  HCT 20.7* 27.3*  PLT 205 188   BMET Recent Labs    11/18/21 0511 11/19/21 0438  NA 141 141  K 3.7 4.2  CL 118* 120*  CO2 17* 15*  GLUCOSE 102* 98  BUN 12 12  CREATININE 1.40* 1.44*  CALCIUM 7.5* 7.6*   PT/INR No results for input(s): LABPROT, INR in the last 72 hours. CMP     Component Value Date/Time   NA 141 11/19/2021 0438   K 4.2 11/19/2021 0438   CL 120 (H) 11/19/2021 0438   CO2 15 (L) 11/19/2021 0438   GLUCOSE 98 11/19/2021 0438   BUN 12 11/19/2021 0438   CREATININE 1.44 (H) 11/19/2021 0438   CALCIUM 7.6 (L) 11/19/2021 0438   PROT 5.1 (L) 11/19/2021 0438   ALBUMIN 1.6 (L)  11/19/2021 0438   AST 26 11/19/2021 0438   ALT 11 11/19/2021 0438   ALKPHOS 289 (H) 11/19/2021 0438   BILITOT 0.7 11/19/2021 0438   GFRNONAA 47 (L) 11/19/2021 0438   Lipase  No results found for: LIPASE     Studies/Results: No results found.  Anti-infectives: Anti-infectives (From admission, onward)    Start     Dose/Rate Route Frequency Ordered Stop   11/19/21 1400  vancomycin (VANCOREADY) IVPB 1000 mg/200 mL        1,000 mg 200 mL/hr over 60 Minutes Intravenous Daily 11/18/21 1417     11/17/21 1830  piperacillin-tazobactam (ZOSYN) IVPB 3.375 g        3.375 g 12.5 mL/hr over 240 Minutes Intravenous Every 8 hours 11/17/21 1810     11/17/21 1830  vancomycin (VANCOREADY) IVPB 1000 mg/200 mL  Status:  Discontinued        1,000 mg 200 mL/hr over 60 Minutes Intravenous Daily 11/17/21 1810 11/18/21 1417   11/17/21 0600  ceFAZolin (ANCEF) IVPB 2g/100 mL premix        2 g 200 mL/hr over 30 Minutes Intravenous On call to O.R. 11/16/21 1226 11/18/21 0559   11/11/21 0700  cefTRIAXone (ROCEPHIN) 2 g in sodium chloride 0.9 % 100 mL IVPB        2  g 200 mL/hr over 30 Minutes Intravenous On call 11/11/21 0654 11/11/21 0957   11/10/21 2200  vancomycin (VANCOREADY) IVPB 1250 mg/250 mL  Status:  Discontinued        1,250 mg 166.7 mL/hr over 90 Minutes Intravenous Every 48 hours 11/08/21 1934 11/10/21 1147   11/09/21 1830  piperacillin-tazobactam (ZOSYN) IVPB 3.375 g  Status:  Discontinued        3.375 g 12.5 mL/hr over 240 Minutes Intravenous Every 8 hours 11/09/21 1730 11/15/21 1105   11/08/21 2015  vancomycin (VANCOREADY) IVPB 1500 mg/300 mL        1,500 mg 150 mL/hr over 120 Minutes Intravenous  Once 11/08/21 1927 11/09/21 0700   11/08/21 2000  metroNIDAZOLE (FLAGYL) IVPB 500 mg  Status:  Discontinued        500 mg 100 mL/hr over 60 Minutes Intravenous Every 12 hours 11/08/21 1908 11/09/21 1719   11/05/21 1700  ceFEPIme (MAXIPIME) 2 g in sodium chloride 0.9 % 100 mL IVPB  Status:   Discontinued        2 g 200 mL/hr over 30 Minutes Intravenous Every 24 hours 11/05/21 1026 11/09/21 1718   11/04/21 1700  ceFEPIme (MAXIPIME) 1 g in sodium chloride 0.9 % 100 mL IVPB  Status:  Discontinued        1 g 200 mL/hr over 30 Minutes Intravenous Every 24 hours 11/03/21 2034 11/05/21 1026   11/04/21 1132  vancomycin variable dose per unstable renal function (pharmacist dosing)  Status:  Discontinued         Does not apply See admin instructions 11/04/21 1132 11/05/21 0954   11/03/21 1530  ceFEPIme (MAXIPIME) 2 g in sodium chloride 0.9 % 100 mL IVPB        2 g 200 mL/hr over 30 Minutes Intravenous  Once 11/03/21 1518 11/03/21 2000   11/03/21 1530  metroNIDAZOLE (FLAGYL) IVPB 500 mg        500 mg 100 mL/hr over 60 Minutes Intravenous  Once 11/03/21 1518 11/03/21 2000   11/03/21 1530  vancomycin (VANCOCIN) IVPB 1000 mg/200 mL premix        1,000 mg 200 mL/hr over 60 Minutes Intravenous  Once 11/03/21 1518 11/03/21 2000        Assessment/Plan Prostate cancer metastatic to multiple sites Dysphagia Failure to thrive - Patient with dysphagia, poor oral intake, and failure to thrive in the setting of metastatic prostate cancer. Family desires for aggressive treatment and would like to continue full scope of care - brother declines palliative consult. He cannot tolerate enough nutrition by mouth and general surgery has been asked to see for placement of G tube. IR unable to place due to anatomy. Unable to place NGT yesterday - possible IR NGT placement today   We had tentatively planned to place G tube earlier this week but patient had worsening delirium and febrile. Now with episodes of hypotension and anemic. Still not currently medically appropriate for G tube placement but we will continue to follow and reassess early next week I will call and discuss plan with brother   ID - none VTE - per primary, ok for chemical dvt prophylaxis from surgical standpoint FEN - D1 diet, Nepro  Shake TID and Prosource Plus BID Foley - SP tube   Septic shock due to UTI - improved, completed abx Aspiration PNA - completed abx Acute on chronic renal failure Macrocytic anemia HTN Dementia   Low Medical Decision Making   LOS: 16 days   Beckie Busing  Britt Boozer [facility] Surgery 11/19/2021, 10:54 AM Please see Amion for pager number during day hours 7:00am-4:30pm

## 2021-11-19 NOTE — Progress Notes (Signed)
PHARMACY - TOTAL PARENTERAL NUTRITION CONSULT NOTE   Indication:  intolerance to oral feeds , failure to thrive  Patient Measurements: Height: _0  (175.3 cm) Weight: 75.9 kg (167 lb 5.3 oz) IBW/kg (Calculated) : 70.7 TPN AdjBW (KG): 76.7 Body mass index is 24.71 kg/m.  Assessment: 88 yo male with dysphasia, poor oral intake, and failure to thrive in the setting of metastatic prostate cancer.  Was previously on dysphasia 1 diet, however failed calorie count.  General surgery was consulted to see patient for placement of G tube, however was unable to be placed due to patient anatomy.  With worsening mental status and vital signs, unable to place NG tube to begin enteral feeds.  Pharmacy has been consulted to dose TPN.    Glucose / Insulin: CBGs <120; no SSI ordered yet Electrolytes: Mg 1.6, CoCa9.5, other elytes wnl Renal: Scr 1.44, BUN 12 - stable Hepatic: Alk phos elevated at 289, AST/ALT wnl, Tbili wnl Intake / Output: +535 mL, UOP 300 mL MIVF: D5NS + Kcl 40 mEq/L at 100 mL/hr GI Imaging: none thus far GI Surgeries / Procedures:  -Attempted NG tube placement 1/6, however could not be advanced due to agitation  Central access: ordered for 1/6 TPN start date: plan for 1/7  Nutritional Goals: Goal TPN rate is 90 mL/hr (provides 112 g of protein and ~2100 kcals per day)  RD Assessment: Estimated Needs Total Energy Estimated Needs: 2100-2300 kcal Total Protein Estimated Needs: 105-120 grams Total Fluid Estimated Needs: >/= 2.3 L/day  Current Nutrition:  Dysphagia 1 - 0% documented intake Ensure Enlive/Plus 2 times daily  Prosource Plus 2 times daily  Plan:  Give magnesium 2g IV x1  Consult received after TPN order cutoff time, TPN will initiate 1/7 PM TPN labs ordered Continue MIVF Order SSI at time of TPN initiation Check TPN labs 1/7  Dimple Nanas, PharmD 11/19/2021 2:12 PM

## 2021-11-19 NOTE — Consult Note (Signed)
WOC Nurse Consult Note: Reason for Consult: DTPI to left heel, moisture associated skin damage-specifically irritant contact dermatitis due to fecal incontinence, full thickness skin loss (Stage 3) to coccygeal area, DTPI to mid thoracic areas with erythema to bilateral great toe tips.  ICD-10 CM Codes for Irritant Dermatitis  L24A2 - Due to fecal, urinary or dual incontinence  Wound type: pressure plus moisture Pressure Injury POA: Yes, sacrum, No (heel), mid thoracic Measurement: Sacrum/coccygeal:  Stage 3, 3cm x 3cm x 0.2cm with 6cm x 6cm area of erythema surrounding Left heel:  5cm x 5cm purple area of discoloration over boggy tissue consistent with DTPI and likely to evolve into a full thickness area of skin loss Left great toe tip:  1cm x 1.5cm area of purple discoloration Right great toe: 2cm x 1cm area of erythema, nonblanching Mid thoracic:  4cm x 2cm area in which there are three purple areas of discoloration Wound bed: As described above Drainage (amount, consistency, odor) Only the sacral area has exudate and it is a small amount of serous Periwound:As described above Dressing procedure/placement/frequency: A mattress replacement is provided today as are bilateral pressure redistribution heel boots. Patient has an indwelling urinary catheter but is incontinent of a large amount of liquid brown stool at the time of my assessment. Topical care to the heel will be with a nonadherent antimicrobial wound contact layer (xeroform) topped with a silicone foam prior to foot being placed into [employer].  The coccyx has been treated with foam, but with the frequent stooling, it is perhaps more appropriate to use a moisture barrier (e.g., zinc oxide/Desitin).   [place] nursing team will follow, seeing weekly for wound assessment while in house and will remain available to this patient, the nursing and medical teams.   Thanks, Maudie Flakes, MSN, RN, [place], Arther Abbott  Pager# [phone]

## 2021-11-19 NOTE — Care Management Important Message (Signed)
Important Message  Patient Details IM Letter placed in Patients room. Name: Anthony Dudley MRN: [id number] Date of Birth: 31-Mar-1933   Medicare Important Message Given:  Yes     Kerin Salen 11/19/2021, 10:57 AM

## 2021-11-19 NOTE — Progress Notes (Signed)
Patient doinf well with liquid intake ... seems to enjoy sweeter drinks and juices VS pureed food.  Took in apprx 420 cc of liquids in including juice, wild berry boost, milk, and pro source supplement

## 2021-11-19 NOTE — Progress Notes (Signed)
PROGRESS NOTE   Anthony Dudley  YTK:[id number] DOB: [birth year] DOA: 11/03/2021 PCP: [name], MD  Brief Narrative:   88 year old male Known metastatic prostate cancer with chronic indwelling Foley CKD 3 Chronic bedbound status HFpEF SVT previously treated with amiodarone  Recent lengthy hospitalization 10/4 through 09/06/2021 secondary to sepsis-patient was found to have hydronephrosis underwent cystoscopy fulguration bilateral PCN and bilateral nephrostomy tube placement by IR 08/2017  Patient was readmitted by critical care service 11/03/2021-found to be pyrexia tachycardic and in urinary retention Received pressors and was sent to the ICU He was resuscitated and transferred to [facility] 12/25 12/29 cystoscopy bilateral stent exchange and placement of suprapubic catheter Recommended dysphagia 1 diet but failed calorie count 1/3 General surgery consulted to place feeding tube as could not be placed by IR secondary to anatomy   Hospital-Problem based course  Sepsis from prior to admission likely combination of probable aspiration pneumonia and multidrug-resistant urinary infection [prior multidrug-resistant Proteus] Cultures negative from admission Received multiple boluses of fluid on 1/4-Continue fluids  Received cefepime as well as Zosyn through 11/14/2021 total of 7 days Re-started Vanc/Zosyn for possible infection blood and urine cult from 11/17/21 are negative--stop all abx today and observe only Acute metabolic encephalopathy present on admission Work-up including B12 TSH EEG CT all negative Cosyntropin testing negative Mental status has completely resolved-he is close to his normal baseline at this time Diarrhea not otherwise specifie 2 episodes of loose stools watery No fever-discontinue laxatives MiraLAX and Colace 1/6 Consider testing for C. difficile continues chronic urinary retention Prostate cancer PSA 2268 Underwent cystoscopy bilateral stent  exchange and suprapubic 12/21 Continue enzalutamide and ADT per urology we will follow-up with Dr. Alinda Money outpatient Anemia ? Cause Transfused 1 U on admit Transfuse 1 more unit 1/5   Hemoccult is +, poor candidate at this stage for any scope If he stabilizes in the outpatient setting we can consider work-up for this as he is not having overtly bloody stools Chronic dysphagia Risk for aspiration Was supposed to get PEG tube Continue dysphagia 1 diet  Family wants all masures taken  Unfortunately patient refused NG tube placement on 1/6 so we will start TPN and place PICC line for the same Acute renal failure superimposed on CKD 3B on admission peak creatinine 6.0 Acidosis of renal failure  D5/NS with K 40 cc at 100 cc/h  Volumes are net +2 L so cut back rate to 75 cc an hour Chronic sinus tachycardia Likely from underlying cancer state in addition to failure to thrive Continue Toprol-XL 25 daily Decubitus ulcer present on admission, left heel pressure injury Place Prevalon boots-turn as tolerated-follow-up wound nurse input  DVT prophylaxis: Foot pumps bilaterally Code Status: Full code confirmed with brother Family Communication: called and d/w brother Deyonte Cadden  [phone]  and updated  1/6 Disposition:  Status is: Inpatient  Remains inpatient appropriate because:     Consultants:  Urology  Procedures:   Antimicrobials:     Subjective:  More awake  Coherent tells me he would not really like to have a tube placed-he is drinking well but he is not eating I received notification that he refused tube placement when he went down to the x-ray unit Have been in communication with his brother who has also spoken with surgical radiology  Objective: Vitals:   11/19/21 0106 11/19/21 0505 11/19/21 1118 11/19/21 1535  BP: 118/62 127/60 (!) 104/57   Pulse: 84 70 85   Resp: 20 20 (!) 22   Temp:  98.8 F (37.1 C) 97.9 F (36.6 C) 97.8 F (36.6 C)   TempSrc: Oral  Oral    SpO2: 100% 100% 100%   Weight:    84 kg  Height:        Intake/Output Summary (Last 24 hours) at 11/19/2021 1612 Last data filed at 11/19/2021 1600 Gross per 24 hour  Intake 3175.02 ml  Output 2250 ml  Net 925.02 ml    Filed Weights   11/15/21 0500 11/16/21 0334 11/19/21 1535  Weight: 81.3 kg 75.9 kg 84 kg    Examination:  Interactive more coherent No icterus no pallor Cannot examine chest clearly Abd soft, has Suprapubic in place  Some Le edema Power seems intact  Psych seems slightly flat  he has an area of dusky skin on left heel He has large watery stool in the bag  Data Reviewed: personally reviewed   CBC    Component Value Date/Time   WBC 6.3 11/19/2021 0438   RBC 2.63 (L) 11/19/2021 0438   HGB 8.3 (L) 11/19/2021 0438   HCT 27.3 (L) 11/19/2021 0438   PLT 188 11/19/2021 0438   MCV 103.8 (H) 11/19/2021 0438   MCH 31.6 11/19/2021 0438   MCHC 30.4 11/19/2021 0438   RDW 22.1 (H) 11/19/2021 0438   LYMPHSABS 1.7 11/19/2021 0438   MONOABS 0.6 11/19/2021 0438   EOSABS 0.3 11/19/2021 0438   BASOSABS 0.0 11/19/2021 0438   CMP Latest Ref Rng & Units 11/19/2021 11/18/2021 11/17/2021  Glucose 70 - 99 mg/dL 98 102(H) 99  BUN 8 - 23 mg/dL 12 12 15   Creatinine 0.61 - 1.24 mg/dL 1.44(H) 1.40(H) 1.50(H)  Sodium 135 - 145 mmol/L 141 141 138  Potassium 3.5 - 5.1 mmol/L 4.2 3.7 3.5  Chloride 98 - 111 mmol/L 120(H) 118(H) 114(H)  CO2 22 - 32 mmol/L 15(L) 17(L) 18(L)  Calcium 8.9 - 10.3 mg/dL 7.6(L) 7.5(L) 7.7(L)  Total Protein 6.5 - 8.1 g/dL 5.1(L) 5.1(L) 5.7(L)  Total Bilirubin 0.3 - 1.2 mg/dL 0.7 0.4 0.4  Alkaline Phos 38 - 126 U/L 289(H) 302(H) 352(H)  AST 15 - 41 U/L 26 17 18   ALT 0 - 44 U/L 11 11 13      Radiology Studies: DG Fluoro Rm 1-60 Min  Result Date: 11/19/2021 CLINICAL DATA:  Poor appetite. EXAM: DG C-ARM 1-60 MIN FLUOROSCOPY TIME:  Fluoroscopy Time:  12 seconds Radiation Exposure Index (if provided by the fluoroscopic device): 1.1 mGy Number of Acquired Spot  Images: None COMPARISON:  Head CT 11/09/2021. Chest radiograph 12/07/2021. FINDINGS: Nasogastric tube placement attempted under fluoroscopic guidance. The patient was agitated and phonating throughout the procedure, and the tube could not be advanced beyond the oropharynx despite multiple attempts. IMPRESSION: Multiple unsuccessful nasogastric tube placement attempts under fluoroscopic guidance. Electronically Signed   By: Kellie Simmering D.O.   On: 11/19/2021 14:18   [place] EKG SITE RITE  Result Date: 11/19/2021 If Site Rite image not attached, placement could not be confirmed due to current cardiac rhythm.    Scheduled Meds:  (feeding supplement) PROSource Plus  30 mL Oral BID BM   chlorhexidine  15 mL Mouth Rinse BID   Chlorhexidine Gluconate Cloth  6 each Topical Daily   feeding supplement  237 mL Oral BID BM   liver oil-zinc oxide   Topical Q4H while awake   mouth rinse  15 mL Mouth Rinse q12n4p   metoprolol succinate  25 mg Oral Daily   Continuous Infusions:  sodium chloride Stopped (11/04/21 0043)  dextrose 5 % and 0.9 % NaCl with KCl 40 mEq/L 100 mL/hr at 11/19/21 1347   magnesium sulfate bolus IVPB 2 g (11/19/21 1527)   piperacillin-tazobactam (ZOSYN)  IV 3.375 g (11/19/21 1525)     LOS: 16 days   Time spent: [place], MD [facility] To contact the attending provider between 7A-7P or the covering provider during after hours 7P-7A, please log into the web site [email or web address] and access using universal [place] password for that web site. If you do not have the password, please call the hospital operator.  11/19/2021, 4:12 PM

## 2021-11-20 DIAGNOSIS — L89629 Pressure ulcer of left heel, unspecified stage: Secondary | ICD-10-CM

## 2021-11-20 DIAGNOSIS — R197 Diarrhea, unspecified: Secondary | ICD-10-CM

## 2021-11-20 DIAGNOSIS — A419 Sepsis, unspecified organism: Secondary | ICD-10-CM | POA: Diagnosis not present

## 2021-11-20 DIAGNOSIS — N390 Urinary tract infection, site not specified: Secondary | ICD-10-CM | POA: Diagnosis not present

## 2021-11-20 DIAGNOSIS — R6521 Severe sepsis with septic shock: Secondary | ICD-10-CM | POA: Diagnosis not present

## 2021-11-20 DIAGNOSIS — G9341 Metabolic encephalopathy: Secondary | ICD-10-CM | POA: Diagnosis not present

## 2021-11-20 LAB — COMPREHENSIVE METABOLIC PANEL
ALT: 13 U/L (ref 0–44)
AST: 18 U/L (ref 15–41)
Albumin: 1.6 g/dL — ABNORMAL LOW (ref 3.5–5.0)
Alkaline Phosphatase: 246 U/L — ABNORMAL HIGH (ref 38–126)
Anion gap: 4 — ABNORMAL LOW (ref 5–15)
BUN: 11 mg/dL (ref 8–23)
CO2: 17 mmol/L — ABNORMAL LOW (ref 22–32)
Calcium: 7.8 mg/dL — ABNORMAL LOW (ref 8.9–10.3)
Chloride: 121 mmol/L — ABNORMAL HIGH (ref 98–111)
Creatinine, Ser: 1.36 mg/dL — ABNORMAL HIGH (ref 0.61–1.24)
GFR, Estimated: 51 mL/min — ABNORMAL LOW (ref >60)
Glucose, Bld: 119 mg/dL — ABNORMAL HIGH (ref 70–99)
Potassium: 4.2 mmol/L (ref 3.5–5.1)
Sodium: 142 mmol/L (ref 135–145)
Total Bilirubin: 0.3 mg/dL (ref 0.3–1.2)
Total Protein: 5.1 g/dL — ABNORMAL LOW (ref 6.5–8.1)

## 2021-11-20 LAB — CBC WITH DIFFERENTIAL/PLATELET
Abs Immature Granulocytes: 0.04 10*3/uL (ref 0.00–0.07)
Basophils Absolute: 0 10*3/uL (ref 0.0–0.1)
Basophils Relative: 1 %
Eosinophils Absolute: 0.4 10*3/uL (ref 0.0–0.5)
Eosinophils Relative: 7 %
HCT: 26.8 % — ABNORMAL LOW (ref 39.0–52.0)
Hemoglobin: 8.1 g/dL — ABNORMAL LOW (ref 13.0–17.0)
Immature Granulocytes: 1 %
Lymphocytes Relative: 28 %
Lymphs Abs: 1.5 10*3/uL (ref 0.7–4.0)
MCH: 31 pg (ref 26.0–34.0)
MCHC: 30.2 g/dL (ref 30.0–36.0)
MCV: 102.7 fL — ABNORMAL HIGH (ref 80.0–100.0)
Monocytes Absolute: 0.5 10*3/uL (ref 0.1–1.0)
Monocytes Relative: 9 %
Neutro Abs: 3 10*3/uL (ref 1.7–7.7)
Neutrophils Relative %: 54 %
Platelets: 208 10*3/uL (ref 150–400)
RBC: 2.61 MIL/uL — ABNORMAL LOW (ref 4.22–5.81)
RDW: 21.3 % — ABNORMAL HIGH (ref 11.5–15.5)
WBC: 5.4 10*3/uL (ref 4.0–10.5)
nRBC: 0 % (ref 0.0–0.2)

## 2021-11-20 LAB — PHOSPHORUS
Phosphorus: 2.6 mg/dL (ref 2.5–4.6)
Phosphorus: 2.9 mg/dL (ref 2.5–4.6)

## 2021-11-20 LAB — GLUCOSE, CAPILLARY
Glucose-Capillary: 107 mg/dL — ABNORMAL HIGH (ref 70–99)
Glucose-Capillary: 97 mg/dL (ref 70–99)

## 2021-11-20 LAB — MAGNESIUM
Magnesium: 1.8 mg/dL (ref 1.7–2.4)
Magnesium: 1.9 mg/dL (ref 1.7–2.4)

## 2021-11-20 LAB — TRIGLYCERIDES: Triglycerides: 95 mg/dL (ref <150)

## 2021-11-20 MED ORDER — SODIUM CHLORIDE 0.9% FLUSH
10.0000 mL | Freq: Two times a day (BID) | INTRAVENOUS | Status: DC
  Administered 2021-11-20 – 2021-11-22 (×2): 10 mL
  Administered 2021-11-24: 20 mL
  Administered 2021-11-24 – 2021-11-26 (×5): 10 mL
  Administered 2021-11-26: 20 mL
  Administered 2021-11-27: 10 mL
  Administered 2021-11-27 – 2021-11-28 (×2): 20 mL
  Administered 2021-11-29 – 2021-11-30 (×5): 10 mL
  Administered 2021-12-01: 30 mL
  Administered 2021-12-01 – 2021-12-02 (×2): 10 mL

## 2021-11-20 MED ORDER — SODIUM CHLORIDE 0.9% FLUSH
10.0000 mL | INTRAVENOUS | Status: DC | PRN
  Administered 2021-11-23: 10 mL

## 2021-11-20 MED ORDER — KCL IN DEXTROSE-NACL 20-5-0.9 MEQ/L-%-% IV SOLN
INTRAVENOUS | Status: DC
  Filled 2021-11-20: qty 1000

## 2021-11-20 MED ORDER — INSULIN ASPART 100 UNIT/ML IJ SOLN
0.0000 [IU] | Freq: Four times a day (QID) | INTRAMUSCULAR | Status: DC
  Administered 2021-11-22 – 2021-11-23 (×3): 1 [IU] via SUBCUTANEOUS
  Administered 2021-11-23: 5 [IU] via SUBCUTANEOUS
  Administered 2021-11-23: 1 [IU] via SUBCUTANEOUS
  Administered 2021-11-23 – 2021-11-24 (×3): 2 [IU] via SUBCUTANEOUS
  Administered 2021-11-25 – 2021-11-26 (×4): 1 [IU] via SUBCUTANEOUS

## 2021-11-20 MED ORDER — MAGNESIUM SULFATE IN D5W 1-5 GM/100ML-% IV SOLN
1.0000 g | Freq: Once | INTRAVENOUS | Status: AC
  Administered 2021-11-20: 1 g via INTRAVENOUS
  Filled 2021-11-20: qty 100

## 2021-11-20 MED ORDER — TRAVASOL 10 % IV SOLN
INTRAVENOUS | Status: AC
  Filled 2021-11-20: qty 499.2

## 2021-11-20 NOTE — Progress Notes (Addendum)
Peripherally Inserted Central Catheter Placement  The IV Nurse has discussed with the patient and/or persons authorized to consent for the patient, the purpose of this procedure and the potential benefits and risks involved with this procedure.  The benefits include less needle sticks, lab draws from the catheter, and the patient may be discharged home with the catheter. Risks include, but not limited to, infection, bleeding, blood clot (thrombus formation), and puncture of an artery; nerve damage and irregular heartbeat and possibility to perform a PICC exchange if needed/ordered by physician.  Alternatives to this procedure were also discussed.  Bard Power PICC patient education guide, fact sheet on infection prevention and patient information card has been provided to patient /or left at bedside.  Telephone consent obtained from brother, Karim Aiello.  PICC Placement Documentation  PICC Double Lumen 29/57/47 PICC Right Basilic 40 cm 1 cm (Active)  Indication for Insertion or Continuance of Line Administration of hyperosmolar/irritating solutions (i.e. TPN, Vancomycin, etc.) 11/20/21 0958  Exposed Catheter (cm) 1 cm 11/20/21 0958  Site Assessment Clean;Dry;Intact 11/20/21 0958  Lumen #1 Status Flushed;Saline locked;Blood return noted 11/20/21 0958  Lumen #2 Status Flushed;Saline locked;Blood return noted 11/20/21 0958  Dressing Type Transparent 11/20/21 0958  Dressing Status Clean;Dry;Intact 11/20/21 0958  Antimicrobial disc in place? Yes 11/20/21 0958  Safety Lock Not Applicable 34/03/70 9643  Line Care Connections checked and tightened 11/20/21 0958  Line Adjustment (NICU/IV Team Only) No 11/20/21 0958  Dressing Intervention New dressing 11/20/21 0958  Dressing Change Due 11/27/21 11/20/21 0958       Rolena Infante 11/20/2021, 9:58 AM

## 2021-11-20 NOTE — Progress Notes (Addendum)
PHARMACY - TOTAL PARENTERAL NUTRITION CONSULT NOTE   Indication:  intolerance to oral feeds , failure to thrive  Patient Measurements: Height: _0  (175.3 cm) Weight: 84 kg (185 lb 3 oz) IBW/kg (Calculated) : 70.7 TPN AdjBW (KG): 76.7 Body mass index is 27.35 kg/m.  Assessment: 88 yo male with dysphasia, poor oral intake, and failure to thrive in the setting of metastatic prostate cancer.  Was previously on dysphasia 1 diet, however failed calorie count.  General surgery was consulted to see patient for placement of G tube, however was unable to be placed due to patient anatomy.  With worsening mental status and vital signs, unable to place NG tube to begin enteral feeds.  Pharmacy has been consulted to dose TPN.    Glucose / Insulin: no hx DM< CBGs <120 prior to starting TPN;  Electrolytes: Mg 1.6> 1.8 after 2 gm bolus, CoCa 9.72, CL 121, CO2 17 Renal: Scr 1.36, BUN 11 Hepatic: Alk phos elevated at 246, AST/ALT wnl, Tbili wnl Intake / Output: +3097.4 mL, UOP 900 mL Stools x 3 recorded MIVF: D5NS + Kcl 40 mEq/L at 75 mL/hr (decr from 100 ml/hr) GI Imaging: none thus far GI Surgeries / Procedures:  -Attempted NG tube placement 1/6, however could not be advanced due to agitation  Central access: PICC placed 1/7 for TPN TPN start date:  1/7  Nutritional Goals: Goal TPN rate is 90 mL/hr (provides 112 g of protein and ~2100 kcals per day)  RD Assessment: 11/19/21 Estimated Needs Total Energy Estimated Needs: 2100-2300 kcal Total Protein Estimated Needs: 105-120 grams Total Fluid Estimated Needs: >/= 2.3 L/day  Current Nutrition:  Dysphagia 1 - 5% documented intake Ensure Enlive/Plus 2 times daily  Prosource Plus 2 times daily  Plan:  Now: Give magnesium 1g IV x1  Start TPN at 40 mL/hr at 1800 Electrolytes in TPN: Na 29mq/L, K 5853m/L, Ca 53m153mL, Mg 53mE72m, and Phos 153mm54m. Cl:Ac Max acetate Add standard MVI and trace elements to TPN Initiate Sensitive q6h SSI and adjust as  needed  Reduce MIVF to 35 mL/hr at 1800 to keep total IVF 75 ml/hr per MD request for 1/7 Monitor TPN labs on Mon/Thurs, BMET, Mag & Phos 1/8  MicheEudelia [email or web address] 11/20/2021 6:38 AM  Addendum:  [facility]

## 2021-11-20 NOTE — Progress Notes (Signed)
PROGRESS NOTE   Anthony Dudley  KGU:[id number] DOB: 1933/10/06 DOA: 11/03/2021 PCP: Anthony Shivers, MD  Brief Narrative:   88 year old male Known metastatic prostate cancer with chronic indwelling Foley CKD 3 Chronic bedbound status HFpEF SVT previously treated with amiodarone  Recent lengthy hospitalization 10/4 through 09/06/2021 secondary to sepsis-patient was found to have hydronephrosis underwent cystoscopy fulguration bilateral PCN and bilateral nephrostomy tube placement by IR 08/2017  Patient was readmitted by critical care service 11/03/2021-found to be pyrexia tachycardic and in urinary retention Received pressors and was sent to the ICU He was resuscitated and transferred to [facility] 12/25 12/29 cystoscopy bilateral stent exchange and placement of suprapubic catheter Recommended dysphagia 1 diet but failed calorie count 1/3 General surgery consulted to place feeding tube as could not be placed by IR secondary to anatomy   Hospital-Problem based course  Sepsis from prior to admission likely combination of probable aspiration pneumonia and multidrug-resistant urinary infection [prior multidrug-resistant Proteus] Cultures negative from admission Received multiple boluses of fluid on 1/4-DC NS plus D5 at 75 cc/h with K and monitor trends with TPN Received cefepime as well as Zosyn through 11/14/2021 total of 7 days Re-started Vanc/Zosyn 11/17/21 --cultures negative--antibiotics finally stopped on 1/6 Acute metabolic encephalopathy present on admission Work-up including B12 TSH EEG CT all negative Cosyntropin testing negative Mental status changes resolved-seems closer to baseline Diarrhea not otherwise specifie No fever-discontinue laxatives MiraLAX and Colace 1/6 Monitor for excessive diarrhea chronic urinary retention Prostate cancer PSA 2268 Underwent cystoscopy bilateral stent exchange and suprapubic 12/21 Continue enzalutamide and ADT per urology. follow-up  with Dr. Alinda Dudley outpatient Anemia ? Cause Transfused 1 U on admit Transfuse 1 more unit 1/5   Hemoccult is +, poor candidate at this stage for any scope in the outpatient setting,consider work-up for this as he is not having overtly bloody stools Chronic dysphagia Risk for aspiration Was supposed to get PEG tube-was unsafe to do so Continue dysphagia 1 diet  Family wants all masures taken  Unfortunately patient refused NG tube placement on 1/6  start TPN and place PICC line for the same General surgery to follow for placement of PEG tube Acute renal failure superimposed on CKD 3B on admission peak creatinine 6.0 Acidosis of renal failure AKI is resolved Mixed alkalosis and acidosis picture secondary to unknown causes Continue TPN Chronic sinus tachycardia Likely from underlying cancer state in addition to failure to thrive Continue Toprol-XL 25 daily Decubitus ulcer present on admission, left heel pressure injury Place Prevalon boots-turn as tolerated-follow-up wound nurse input  DVT prophylaxis: Foot pumps bilaterally Code Status: Full code confirmed with brother Family Communication: called and d/w brother Anthony Dudley  [phone] last updated 1/6 Disposition:  Status is: Inpatient  Remains inpatient appropriate because:     Consultants:  Urology  Procedures:   Antimicrobials:     Subjective:  Somewhat coherent, but cannot understand completely PICC line placed this morning TPN to start later today Small stool in bed according to nursing staff Otherwise stable  Objective: Vitals:   11/19/21 1535 11/19/21 2008 11/20/21 0525 11/20/21 1037  BP:  (!) 111/56 106/80 (!) 107/50  Pulse:  85 81   Resp:  20 20   Temp:  97.7 F (36.5 C) 97.7 F (36.5 C)   TempSrc:      SpO2:  100% 99%   Weight: 84 kg     Height:        Intake/Output Summary (Last 24 hours) at 11/20/2021 1308 Last data filed at 11/20/2021  1200 Gross per 24 hour  Intake 4062.4 ml  Output 600 ml   Net 3462.4 ml    Filed Weights   11/15/21 0500 11/16/21 0334 11/19/21 1535  Weight: 81.3 kg 75.9 kg 84 kg    Examination:  Interactive more coherent, no apparent distress No icterus no pallor Anteriorly chest is clear Abd soft, has Suprapubic  Some Le edema Power seems intact  Psych seems slightly flat Could not examine his decubitus  Data Reviewed: personally reviewed   CBC    Component Value Date/Time   WBC 5.4 11/20/2021 0521   RBC 2.61 (L) 11/20/2021 0521   HGB 8.1 (L) 11/20/2021 0521   HCT 26.8 (L) 11/20/2021 0521   PLT 208 11/20/2021 0521   MCV 102.7 (H) 11/20/2021 0521   MCH 31.0 11/20/2021 0521   MCHC 30.2 11/20/2021 0521   RDW 21.3 (H) 11/20/2021 0521   LYMPHSABS 1.5 11/20/2021 0521   MONOABS 0.5 11/20/2021 0521   EOSABS 0.4 11/20/2021 0521   BASOSABS 0.0 11/20/2021 0521   CMP Latest Ref Rng & Units 11/20/2021 11/19/2021 11/18/2021  Glucose 70 - 99 mg/dL 119(H) 98 102(H)  BUN 8 - 23 mg/dL 11 12 12   Creatinine 0.61 - 1.24 mg/dL 1.36(H) 1.44(H) 1.40(H)  Sodium 135 - 145 mmol/L 142 141 141  Potassium 3.5 - 5.1 mmol/L 4.2 4.2 3.7  Chloride 98 - 111 mmol/L 121(H) 120(H) 118(H)  CO2 22 - 32 mmol/L 17(L) 15(L) 17(L)  Calcium 8.9 - 10.3 mg/dL 7.8(L) 7.6(L) 7.5(L)  Total Protein 6.5 - 8.1 g/dL 5.1(L) 5.1(L) 5.1(L)  Total Bilirubin 0.3 - 1.2 mg/dL 0.3 0.7 0.4  Alkaline Phos 38 - 126 U/L 246(H) 289(H) 302(H)  AST 15 - 41 U/L 18 26 17   ALT 0 - 44 U/L 13 11 11      Radiology Studies: DG Fluoro Rm 1-60 Min  Result Date: 11/19/2021 CLINICAL DATA:  Poor appetite. EXAM: DG C-ARM 1-60 MIN FLUOROSCOPY TIME:  Fluoroscopy Time:  12 seconds Radiation Exposure Index (if provided by the fluoroscopic device): 1.1 mGy Number of Acquired Spot Images: None COMPARISON:  Head CT 11/09/2021. Chest radiograph 12/07/2021. FINDINGS: Nasogastric tube placement attempted under fluoroscopic guidance. The patient was agitated and phonating throughout the procedure, and the tube could not be  advanced beyond the oropharynx despite multiple attempts. IMPRESSION: Multiple unsuccessful nasogastric tube placement attempts under fluoroscopic guidance. Electronically Signed   By: Kellie Simmering D.O.   On: 11/19/2021 14:18   [place] EKG SITE RITE  Result Date: 11/19/2021 If Site Rite image not attached, placement could not be confirmed due to current cardiac rhythm.    Scheduled Meds:  (feeding supplement) PROSource Plus  30 mL Oral BID BM   chlorhexidine  15 mL Mouth Rinse BID   Chlorhexidine Gluconate Cloth  6 each Topical Daily   feeding supplement  237 mL Oral BID BM   [START ON 11/21/2021] insulin aspart  0-9 Units Subcutaneous Q6H   liver oil-zinc oxide   Topical Q4H while awake   mouth rinse  15 mL Mouth Rinse q12n4p   metoprolol succinate  25 mg Oral Daily   sodium chloride flush  10-40 mL Intracatheter Q12H   Continuous Infusions:  sodium chloride Stopped (11/04/21 0043)   dextrose 5 % and 0.9 % NaCl with KCl 20 mEq/L     dextrose 5 % and 0.9 % NaCl with KCl 40 mEq/L 75 mL/hr at 11/20/21 1027   TPN ADULT (ION)       LOS: 17 days  Time spent: [place], MD [facility] To contact the attending provider between 7A-7P or the covering provider during after hours 7P-7A, please log into the web site [email or web address] and access using universal [place] password for that web site. If you do not have the password, please call the hospital operator.  11/20/2021, 1:08 PM

## 2021-11-21 DIAGNOSIS — G9341 Metabolic encephalopathy: Secondary | ICD-10-CM | POA: Diagnosis not present

## 2021-11-21 DIAGNOSIS — N390 Urinary tract infection, site not specified: Secondary | ICD-10-CM | POA: Diagnosis not present

## 2021-11-21 DIAGNOSIS — R6521 Severe sepsis with septic shock: Secondary | ICD-10-CM | POA: Diagnosis not present

## 2021-11-21 DIAGNOSIS — A419 Sepsis, unspecified organism: Secondary | ICD-10-CM | POA: Diagnosis not present

## 2021-11-21 LAB — BASIC METABOLIC PANEL
Anion gap: 3 — ABNORMAL LOW (ref 5–15)
BUN: 11 mg/dL (ref 8–23)
CO2: 16 mmol/L — ABNORMAL LOW (ref 22–32)
Calcium: 7.5 mg/dL — ABNORMAL LOW (ref 8.9–10.3)
Chloride: 120 mmol/L — ABNORMAL HIGH (ref 98–111)
Creatinine, Ser: 1.36 mg/dL — ABNORMAL HIGH (ref 0.61–1.24)
GFR, Estimated: 51 mL/min — ABNORMAL LOW (ref >60)
Glucose, Bld: 95 mg/dL (ref 70–99)
Potassium: 4.5 mmol/L (ref 3.5–5.1)
Sodium: 139 mmol/L (ref 135–145)

## 2021-11-21 LAB — PHOSPHORUS
Phosphorus: 4.9 mg/dL — ABNORMAL HIGH (ref 2.5–4.6)
Phosphorus: 3.1 mg/dL (ref 2.5–4.6)

## 2021-11-21 LAB — GLUCOSE, CAPILLARY
Glucose-Capillary: 101 mg/dL — ABNORMAL HIGH (ref 70–99)
Glucose-Capillary: 100 mg/dL — ABNORMAL HIGH (ref 70–99)

## 2021-11-21 LAB — MAGNESIUM
Magnesium: 2.1 mg/dL (ref 1.7–2.4)
Magnesium: 1.8 mg/dL (ref 1.7–2.4)

## 2021-11-21 MED ORDER — MAGNESIUM SULFATE 2 GM/50ML IV SOLN
2.0000 g | Freq: Once | INTRAVENOUS | Status: AC
  Administered 2021-11-21: 2 g via INTRAVENOUS
  Filled 2021-11-21: qty 50

## 2021-11-21 MED ORDER — TRAVASOL 10 % IV SOLN
INTRAVENOUS | Status: AC
  Filled 2021-11-21: qty 1123.2

## 2021-11-21 NOTE — Progress Notes (Signed)
PHARMACY - TOTAL PARENTERAL NUTRITION CONSULT NOTE   Indication:  intolerance to oral feeds , failure to thrive  Patient Measurements: Height: '5\' 9"'  (175.3 cm) Weight: 84 kg (185 lb 3 oz) IBW/kg (Calculated) : 70.7 TPN AdjBW (KG): 76.7 Body mass index is 27.35 kg/m.  Assessment: 88 yo male with dysphasia, poor oral intake, and failure to thrive in the setting of metastatic prostate cancer.  Was previously on dysphasia 1 diet, however failed calorie count.  General surgery was consulted to see patient for placement of G tube, however was unable to be placed due to patient anatomy.  With worsening mental status and vital signs, unable to place NG tube to begin enteral feeds.  Pharmacy has been consulted to dose TPN.    Glucose / Insulin: no hx DM CBG 99  & 100 with TPN @ 40 ml/hr, no SSI needed;  Electrolytes:  K 4.5; CL 120, CO2 16; CoCa 9.52; phos 3.1, mag 1.8 Renal: Scr 1.36, BUN 11 Hepatic: Alk phos elevated at 246, AST/ALT wnl, Tbili wnl on 11/20/21 Intake / Output: -1011.9 mL, UOP 3350 mL Stools x 1 recorded MIVF: IVF dc'd by TRH on 1/7  GI Imaging: none thus far GI Surgeries / Procedures:  -Attempted NG tube placement 1/6, however could not be advanced due to agitation  Central access: PICC placed 1/7 for TPN TPN start date:  1/7  Nutritional Goals: Goal TPN rate is 90 mL/hr (provides 112 g of protein and ~2100 kcals per day)  RD Assessment: 11/19/21 Estimated Needs Total Energy Estimated Needs: 2100-2300 kcal Total Protein Estimated Needs: 105-120 grams Total Fluid Estimated Needs: >/= 2.3 L/day  Current Nutrition:  Dysphagia 1 - 15% & 25% of 2 meals documented intake Ensure Enlive/Plus 2 times daily  Prosource Plus 2 times daily  Plan:  Now: Mag 2 gm IVPB Increase TPN to goal rate of 90 mL/hr at 1800 Electrolytes in TPN: Na 54mq/L, K 450m/L, Ca 15m10mL, Mg 15mE815m, and Phos 115mm28m. Cl:Ac Max acetate Add standard MVI and trace elements to TPN continue Sensitive q6h  SSI and adjust as needed  MIVF DC'd by TRH on 1/7 Monitor TPN labs on Mon/Thurs  MicheEudelia Bunchrm.D 11/21/2021 6:24 AM

## 2021-11-21 NOTE — Progress Notes (Signed)
PROGRESS NOTE   Anthony Dudley  WUJ:[id number] DOB: 13-Mar-1933 DOA: 11/03/2021 PCP: [name], MD  Brief Narrative:   88 year old male Known metastatic prostate cancer with chronic indwelling Foley CKD 3 Chronic bedbound status HFpEF SVT previously treated with amiodarone  Recent lengthy hospitalization 10/4 through 09/06/2021 secondary to sepsis-patient was found to have hydronephrosis underwent cystoscopy fulguration bilateral PCN and bilateral nephrostomy tube placement by IR 08/2017  Patient was readmitted by critical care service 11/03/2021-found to be pyrexia tachycardic and in urinary retention Received pressors and was sent to the ICU He was resuscitated and transferred to [facility] 12/25 12/29 cystoscopy bilateral stent exchange and placement of suprapubic catheter Recommended dysphagia 1 diet but failed calorie count 1/3 General surgery consulted to place feeding tube as could not be placed by IR secondary to anatomy TPN was started 1/7 after PICC was placed   Hospital-Problem based course  Sepsis from prior to admission likely combination of probable aspiration pneumonia and multidrug-resistant urinary infection [prior multidrug-resistant Proteus] Cultures negative from admission Received multiple boluses of fluid on 1/4-fluids now being adjusted with TPN Received cefepime as well as Zosyn through 11/14/2021 total of 7 days Re-started Vanc/Zosyn 11/17/21 --cultures negative--antibiotics finally stopped on 1/6 Acute metabolic encephalopathy present on admission Work-up including B12 TSH EEG CT all negative Cosyntropin testing negative Mental status improved delirium resolved  Diarrhea not otherwise specifie discontinue laxatives MiraLAX and Colace 1/6 Monitor for excessive diarrhea chronic urinary retention Prostate cancer PSA 2268 Underwent cystoscopy bilateral stent exchange and suprapubic 12/21 Continue enzalutamide and ADT per urology. follow-up with  Dr. Alinda Money outpatient Anemia ? Cause Transfused 1 U on admit Transfuse 1 more unit 1/5   Hemoccult is +, poor candidate at this stage for any scope in the outpatient setting,consider work-up for this as he is not having overtly bloody stools Chronic dysphagia Risk for aspiration TPN since 1/7 Was supposed to get PEG tube-will be reevaluated hopefully by general surgery 1/9 for consideration of this Continue dysphagia 1 diet  Family wants all masures taken  Unfortunately patient refused NG tube placement on 1/6 Acute renal failure superimposed on CKD 3B on admission peak creatinine 6.0 Acidosis of renal failure AKI is resolved Mixed alkalosis and acidosis picture secondary to unknown causes Continue TPN replace micronutrients as per TPN-- Chronic sinus tachycardia Likely from underlying cancer state in addition to failure to thrive Continue Toprol-XL 25 daily Decubitus ulcer present on admission, left heel pressure injury Place Prevalon boots-turn as tolerated-follow-up wound nurse input  DVT prophylaxis: Foot pumps bilaterally Code Status: Full code confirmed  Family Communication: called and d/w brother Jasir Rother  [phone] last updated 1/8 Disposition:  Status is: Inpatient  Remains inpatient appropriate because:     Consultants:  Urology  Procedures:   Antimicrobials:     Subjective:  Awake Seems to be eating a little better, maybe about 50% of meals No other real complaints  Objective: Vitals:   11/20/21 1400 11/20/21 1925 11/21/21 0409 11/21/21 1127  BP: (!) 103/50 (!) 141/79 (!) 125/57 (!) 127/50  Pulse: 79 87 87 73  Resp: 14 18 16 20   Temp: 98.3 F (36.8 C) 98.2 F (36.8 C) 98 F (36.7 C) 98.3 F (36.8 C)  TempSrc: Oral Axillary Axillary Oral  SpO2: 99% 97% 97% 99%  Weight:      Height:        Intake/Output Summary (Last 24 hours) at 11/21/2021 1141 Last data filed at 11/21/2021 1026 Gross per 24 hour  Intake 2238.07 ml  Output 4100 ml   Net -1861.93 ml    Filed Weights   11/15/21 0500 11/16/21 0334 11/19/21 1535  Weight: 81.3 kg 75.9 kg 84 kg    Examination:  Interactive more coherent No icterus no pallor Anteriorly chest is clear, no added sound Abd soft, has Suprapubic  Some Le edema Power seems intact --Psych seems slightly flat Could not examine his decubitus  Data Reviewed: personally reviewed   CBC    Component Value Date/Time   WBC 5.4 11/20/2021 0521   RBC 2.61 (L) 11/20/2021 0521   HGB 8.1 (L) 11/20/2021 0521   HCT 26.8 (L) 11/20/2021 0521   PLT 208 11/20/2021 0521   MCV 102.7 (H) 11/20/2021 0521   MCH 31.0 11/20/2021 0521   MCHC 30.2 11/20/2021 0521   RDW 21.3 (H) 11/20/2021 0521   LYMPHSABS 1.5 11/20/2021 0521   MONOABS 0.5 11/20/2021 0521   EOSABS 0.4 11/20/2021 0521   BASOSABS 0.0 11/20/2021 0521   CMP Latest Ref Rng & Units 11/21/2021 11/20/2021 11/19/2021  Glucose 70 - 99 mg/dL 95 119(H) 98  BUN 8 - 23 mg/dL 11 11 12   Creatinine 0.61 - 1.24 mg/dL 1.36(H) 1.36(H) 1.44(H)  Sodium 135 - 145 mmol/L 139 142 141  Potassium 3.5 - 5.1 mmol/L 4.5 4.2 4.2  Chloride 98 - 111 mmol/L 120(H) 121(H) 120(H)  CO2 22 - 32 mmol/L 16(L) 17(L) 15(L)  Calcium 8.9 - 10.3 mg/dL 7.5(L) 7.8(L) 7.6(L)  Total Protein 6.5 - 8.1 g/dL - 5.1(L) 5.1(L)  Total Bilirubin 0.3 - 1.2 mg/dL - 0.3 0.7  Alkaline Phos 38 - 126 U/L - 246(H) 289(H)  AST 15 - 41 U/L - 18 26  ALT 0 - 44 U/L - 13 11   Radiology Studies: DG Fluoro Rm 1-60 Min  Result Date: 11/19/2021 CLINICAL DATA:  Poor appetite. EXAM: DG C-ARM 1-60 MIN FLUOROSCOPY TIME:  Fluoroscopy Time:  12 seconds Radiation Exposure Index (if provided by the fluoroscopic device): 1.1 mGy Number of Acquired Spot Images: None COMPARISON:  Head CT 11/09/2021. Chest radiograph 12/07/2021. FINDINGS: Nasogastric tube placement attempted under fluoroscopic guidance. The patient was agitated and phonating throughout the procedure, and the tube could not be advanced beyond the  oropharynx despite multiple attempts. IMPRESSION: Multiple unsuccessful nasogastric tube placement attempts under fluoroscopic guidance. Electronically Signed   By: Kellie Simmering D.O.   On: 11/19/2021 14:18   [place] EKG SITE RITE  Result Date: 11/19/2021 If Site Rite image not attached, placement could not be confirmed due to current cardiac rhythm.    Scheduled Meds:  (feeding supplement) PROSource Plus  30 mL Oral BID BM   chlorhexidine  15 mL Mouth Rinse BID   Chlorhexidine Gluconate Cloth  6 each Topical Daily   feeding supplement  237 mL Oral BID BM   insulin aspart  0-9 Units Subcutaneous Q6H   liver oil-zinc oxide   Topical Q4H while awake   mouth rinse  15 mL Mouth Rinse q12n4p   metoprolol succinate  25 mg Oral Daily   sodium chloride flush  10-40 mL Intracatheter Q12H   Continuous Infusions:  sodium chloride Stopped (11/04/21 0043)   magnesium sulfate bolus IVPB     TPN ADULT (ION) 40 mL/hr at 11/20/21 1720   TPN ADULT (ION)       LOS: 18 days   Time spent: 43  Nita Sells, MD [facility] To contact the attending provider between 7A-7P or the covering provider during after hours 7P-7A, please log into the web  site [email or web address] and access using universal [place] password for that web site. If you do not have the password, please call the hospital operator.  11/21/2021, 11:41 AM

## 2021-11-22 DIAGNOSIS — R6521 Severe sepsis with septic shock: Secondary | ICD-10-CM | POA: Diagnosis not present

## 2021-11-22 DIAGNOSIS — N390 Urinary tract infection, site not specified: Secondary | ICD-10-CM | POA: Diagnosis not present

## 2021-11-22 DIAGNOSIS — A419 Sepsis, unspecified organism: Secondary | ICD-10-CM | POA: Diagnosis not present

## 2021-11-22 DIAGNOSIS — G9341 Metabolic encephalopathy: Secondary | ICD-10-CM | POA: Diagnosis not present

## 2021-11-22 LAB — COMPREHENSIVE METABOLIC PANEL
ALT: 12 U/L (ref 0–44)
AST: 16 U/L (ref 15–41)
Albumin: 1.8 g/dL — ABNORMAL LOW (ref 3.5–5.0)
Alkaline Phosphatase: 312 U/L — ABNORMAL HIGH (ref 38–126)
Anion gap: 5 (ref 5–15)
BUN: 23 mg/dL (ref 8–23)
CO2: 19 mmol/L — ABNORMAL LOW (ref 22–32)
Calcium: 7.4 mg/dL — ABNORMAL LOW (ref 8.9–10.3)
Chloride: 116 mmol/L — ABNORMAL HIGH (ref 98–111)
Creatinine, Ser: 1.22 mg/dL (ref 0.61–1.24)
GFR, Estimated: 58 mL/min — ABNORMAL LOW (ref >60)
Glucose, Bld: 112 mg/dL — ABNORMAL HIGH (ref 70–99)
Potassium: 4.6 mmol/L (ref 3.5–5.1)
Sodium: 140 mmol/L (ref 135–145)
Total Bilirubin: 0.3 mg/dL (ref 0.3–1.2)
Total Protein: 5.3 g/dL — ABNORMAL LOW (ref 6.5–8.1)

## 2021-11-22 LAB — GLUCOSE, CAPILLARY
Glucose-Capillary: 100 mg/dL — ABNORMAL HIGH (ref 70–99)
Glucose-Capillary: 105 mg/dL — ABNORMAL HIGH (ref 70–99)
Glucose-Capillary: 114 mg/dL — ABNORMAL HIGH (ref 70–99)
Glucose-Capillary: 115 mg/dL — ABNORMAL HIGH (ref 70–99)
Glucose-Capillary: 122 mg/dL — ABNORMAL HIGH (ref 70–99)
Glucose-Capillary: 146 mg/dL — ABNORMAL HIGH (ref 70–99)
Glucose-Capillary: 75 mg/dL (ref 70–99)
Glucose-Capillary: 89 mg/dL (ref 70–99)
Glucose-Capillary: 96 mg/dL (ref 70–99)
Glucose-Capillary: 99 mg/dL (ref 70–99)

## 2021-11-22 LAB — PHOSPHORUS: Phosphorus: 3.2 mg/dL (ref 2.5–4.6)

## 2021-11-22 LAB — TRIGLYCERIDES: Triglycerides: 108 mg/dL (ref <150)

## 2021-11-22 LAB — MAGNESIUM: Magnesium: 2 mg/dL (ref 1.7–2.4)

## 2021-11-22 LAB — CULTURE, BLOOD (ROUTINE X 2)
Culture: NO GROWTH
Culture: NO GROWTH
Special Requests: ADEQUATE

## 2021-11-22 MED ORDER — TRAVASOL 10 % IV SOLN
INTRAVENOUS | Status: AC
  Filled 2021-11-22: qty 1123.2

## 2021-11-22 MED ORDER — COLLAGENASE 250 UNIT/GM EX OINT
TOPICAL_OINTMENT | Freq: Every day | CUTANEOUS | Status: DC
  Administered 2021-11-25 – 2021-11-30 (×5): 1 via TOPICAL
  Filled 2021-11-22 (×2): qty 30

## 2021-11-22 NOTE — Progress Notes (Signed)
PHARMACY - TOTAL PARENTERAL NUTRITION CONSULT NOTE   Indication:  intolerance to oral feeds , failure to thrive  Patient Measurements: Height: '5\' 9"'  (175.3 cm) Weight: 84 kg (185 lb 3 oz) IBW/kg (Calculated) : 70.7 TPN AdjBW (KG): 76.7 Body mass index is 27.35 kg/m.  Assessment: 88 yo male with dysphasia, poor oral intake, and failure to thrive in the setting of metastatic prostate cancer.  Was previously on dysphasia 1 diet, however failed calorie count.  General surgery was consulted to see patient for placement of G tube, however was unable to be placed due to patient anatomy.  With worsening mental status and vital signs, unable to place NG tube to begin enteral feeds.  Pharmacy has been consulted to dose TPN.    Glucose / Insulin: no hx DM  - on sSSI q6h (has not used any insulin during the past 24 hrs) - cbgs: 95-114 Electrolytes:  CL high at 116. CO2 low at 19 -other lytes wnl including CorrCa Renal: Scr down 1.22, BUN 11 Hepatic: AST/ALT and Tbili wnl - Alk phos elevated and trending up with 312 Intake / Output: - 601 mL - UOP: 2900 ml MIVF: IVF dc'd by TRH on 1/7  GI Imaging: none thus far GI Surgeries / Procedures:  -1/6: Attempted NG tube placement, however could not be advanced due to agitation  Central access: PICC placed 1/7 for TPN TPN start date:  1/7  Nutritional Goals: Goal TPN rate is 90 mL/hr (provides 112 g of protein and ~2100 kcals per day)  RD Assessment: 11/19/21 Estimated Needs Total Energy Estimated Needs: 2100-2300 kcal Total Protein Estimated Needs: 105-120 grams Total Fluid Estimated Needs: >/= 2.3 L/day  Current Nutrition:  - Dysphagia 1 - 15% & 25% of 2 meals documented intake - Ensure Enlive/Plus 2 times daily (missed some doses d/t agitation)  Plan:   At 1800: - continue TPN at goal rate of 90 mL/hr at 1800 - Electrolytes in TPN:  Na 11mq/L K 457m/L Ca 5m34mL Mg 5mE44m Phos 15mm17m Cl:Ac Max acetate - Add standard MVI and  trace elements to TPN - continue Sensitive q6h SSI and adjust as needed  - MIVF DC'd by TRH on 1/7 - Monitor TPN labs on Mon/Thurs - cmet, phos and mag on 1/10  Anthony Dudley PDia SitterrmD, BCPS 11/22/2021 7:23 AM

## 2021-11-22 NOTE — Progress Notes (Signed)
Progress Note  11 Days Post-Op  Subjective: Alert and delirium remains improved this am. Oriented to self and situation. Does not know year. Denies pain anywhere. Still with low PO intake and he admits not feeling hungry  Objective: Vital signs in last 24 hours: Temp:  [98.3 F (36.8 C)-99.4 F (37.4 C)] 98.5 F (36.9 C) (01/09 0519) Pulse Rate:  [71-78] 71 (01/09 0853) Resp:  [20-22] 22 (01/09 0519) BP: (107-127)/(42-57) 107/42 (01/09 0853) SpO2:  [95 %-100 %] 100 % (01/09 0853) Weight:  [84 kg] 84 kg (01/09 0500) Last BM Date: 11/21/21  Intake/Output from previous day: 01/08 0701 - 01/09 0700 In: 2299 [P.O.:620; I.V.:1629; IV Piggyback:50] Out: 2900 [Urine:2900] Intake/Output this shift: No intake/output data recorded.  PE: General: chronically ill appearing male who is laying in bed in NAD HEENT: head is normocephalic, atraumatic.   Mouth is pink and moist. edentulous Heart: regular, rate, and rhythm. Palpable radial pulses  Lungs: Respiratory effort nonlabored on room air Abd: soft, NT, ND, +BS MSK: all 4 extremities are symmetrical with no cyanosis, clubbing, or edema. Skin: warm and dry Psych: A&Ox3 with an appropriate affect.    Lab Results:  Recent Labs    11/20/21 0521  WBC 5.4  HGB 8.1*  HCT 26.8*  PLT 208    BMET Recent Labs    11/21/21 0825 11/22/21 0348  NA 139 140  K 4.5 4.6  CL 120* 116*  CO2 16* 19*  GLUCOSE 95 112*  BUN 11 23  CREATININE 1.36* 1.22  CALCIUM 7.5* 7.4*    PT/INR No results for input(s): LABPROT, INR in the last 72 hours. CMP     Component Value Date/Time   NA 140 11/22/2021 0348   K 4.6 11/22/2021 0348   CL 116 (H) 11/22/2021 0348   CO2 19 (L) 11/22/2021 0348   GLUCOSE 112 (H) 11/22/2021 0348   BUN 23 11/22/2021 0348   CREATININE 1.22 11/22/2021 0348   CALCIUM 7.4 (L) 11/22/2021 0348   PROT 5.3 (L) 11/22/2021 0348   ALBUMIN 1.8 (L) 11/22/2021 0348   AST 16 11/22/2021 0348   ALT 12 11/22/2021 0348    ALKPHOS 312 (H) 11/22/2021 0348   BILITOT 0.3 11/22/2021 0348   GFRNONAA 58 (L) 11/22/2021 0348   Lipase  No results found for: LIPASE     Studies/Results: No results found.  Anti-infectives: Anti-infectives (From admission, onward)    Start     Dose/Rate Route Frequency Ordered Stop   11/19/21 1400  vancomycin (VANCOREADY) IVPB 1000 mg/200 mL  Status:  Discontinued        1,000 mg 200 mL/hr over 60 Minutes Intravenous Daily 11/18/21 1417 11/19/21 1146   11/17/21 1830  piperacillin-tazobactam (ZOSYN) IVPB 3.375 g  Status:  Discontinued        3.375 g 12.5 mL/hr over 240 Minutes Intravenous Every 8 hours 11/17/21 1810 11/19/21 1628   11/17/21 1830  vancomycin (VANCOREADY) IVPB 1000 mg/200 mL  Status:  Discontinued        1,000 mg 200 mL/hr over 60 Minutes Intravenous Daily 11/17/21 1810 11/18/21 1417   11/17/21 0600  ceFAZolin (ANCEF) IVPB 2g/100 mL premix        2 g 200 mL/hr over 30 Minutes Intravenous On call to O.R. 11/16/21 1226 11/18/21 0559   11/11/21 0700  cefTRIAXone (ROCEPHIN) 2 g in sodium chloride 0.9 % 100 mL IVPB        2 g 200 mL/hr over 30 Minutes Intravenous On call 11/11/21  2951 11/11/21 0957   11/10/21 2200  vancomycin (VANCOREADY) IVPB 1250 mg/250 mL  Status:  Discontinued        1,250 mg 166.7 mL/hr over 90 Minutes Intravenous Every 48 hours 11/08/21 1934 11/10/21 1147   11/09/21 1830  piperacillin-tazobactam (ZOSYN) IVPB 3.375 g  Status:  Discontinued        3.375 g 12.5 mL/hr over 240 Minutes Intravenous Every 8 hours 11/09/21 1730 11/15/21 1105   11/08/21 2015  vancomycin (VANCOREADY) IVPB 1500 mg/300 mL        1,500 mg 150 mL/hr over 120 Minutes Intravenous  Once 11/08/21 1927 11/09/21 0700   11/08/21 2000  metroNIDAZOLE (FLAGYL) IVPB 500 mg  Status:  Discontinued        500 mg 100 mL/hr over 60 Minutes Intravenous Every 12 hours 11/08/21 1908 11/09/21 1719   11/05/21 1700  ceFEPIme (MAXIPIME) 2 g in sodium chloride 0.9 % 100 mL IVPB  Status:   Discontinued        2 g 200 mL/hr over 30 Minutes Intravenous Every 24 hours 11/05/21 1026 11/09/21 1718   11/04/21 1700  ceFEPIme (MAXIPIME) 1 g in sodium chloride 0.9 % 100 mL IVPB  Status:  Discontinued        1 g 200 mL/hr over 30 Minutes Intravenous Every 24 hours 11/03/21 2034 11/05/21 1026   11/04/21 1132  vancomycin variable dose per unstable renal function (pharmacist dosing)  Status:  Discontinued         Does not apply See admin instructions 11/04/21 1132 11/05/21 0954   11/03/21 1530  ceFEPIme (MAXIPIME) 2 g in sodium chloride 0.9 % 100 mL IVPB        2 g 200 mL/hr over 30 Minutes Intravenous  Once 11/03/21 1518 11/03/21 2000   11/03/21 1530  metroNIDAZOLE (FLAGYL) IVPB 500 mg        500 mg 100 mL/hr over 60 Minutes Intravenous  Once 11/03/21 1518 11/03/21 2000   11/03/21 1530  vancomycin (VANCOCIN) IVPB 1000 mg/200 mL premix        1,000 mg 200 mL/hr over 60 Minutes Intravenous  Once 11/03/21 1518 11/03/21 2000        Assessment/Plan Prostate cancer metastatic to multiple sites Dysphagia Failure to thrive - Patient with dysphagia, poor oral intake, and failure to thrive in the setting of metastatic prostate cancer. Family desires for aggressive treatment and would like to continue full scope of care - brother declines palliative consult. He cannot tolerate enough nutrition by mouth and general surgery has been asked to see for placement of G tube. IR unable to place due to anatomy. Unable to place NGT. Started on TPN 1/7   Tentatively plan for G tube placement tomorrow. CBC in am and NPO MN  ID - none VTE - per primary, ok for chemical dvt prophylaxis from surgical standpoint FEN - D1 diet, Nepro Shake TID and Prosource Plus BID, NPO MN Foley - SP tube   Septic shock due to UTI - improved, completed abx Aspiration PNA - completed abx Acute on chronic renal failure Macrocytic anemia HTN Dementia   Low Medical Decision Making   LOS: 19 days   Winferd Humphrey, [facility] 11/22/2021, 10:34 AM Please see Amion for pager number during day hours 7:00am-4:30pm

## 2021-11-22 NOTE — Progress Notes (Signed)
PROGRESS NOTE   Anthony Dudley  UVO:[id number] DOB: 09/11/1933 DOA: 11/03/2021 PCP: [name], MD  Brief Narrative:   88 year old male Known metastatic prostate cancer with chronic indwelling Foley CKD 3 Chronic bedbound status HFpEF SVT previously treated with amiodarone  Recent lengthy hospitalization 10/4 through 09/06/2021 secondary to sepsis-patient was found to have hydronephrosis underwent cystoscopy fulguration bilateral PCN and bilateral nephrostomy tube placement by IR 08/2017  Patient was readmitted by critical care service 11/03/2021-found to be pyrexia tachycardic and in urinary retention Received pressors and was sent to the ICU He was resuscitated and transferred to [facility] 12/25 12/29 cystoscopy bilateral stent exchange and placement of suprapubic catheter Recommended dysphagia 1 diet but failed calorie count 1/3 General surgery consulted to place feeding tube as could not be placed by IR secondary to anatomy TPN was started 1/7 after PICC was placed   Hospital-Problem based course  Sepsis from prior to admission likely combination of probable aspiration pneumonia and multidrug-resistant urinary infection [prior multidrug-resistant Proteus] Cultures negative from admission Received multiple boluses of fluid on 1/4-fluids now being adjusted with TPN Received cefepime as well as Zosyn through 11/14/2021 total of 7 days Re-started Vanc/Zosyn 11/17/21 --cultures negative--antibiotics finally stopped on 1/6 Acute metabolic encephalopathy present on admission Work-up including B12 TSH EEG CT all negative Cosyntropin testing negative Mental status improved delirium resolved  Diarrhea not otherwise specifie discontinue laxatives MiraLAX and Colace 1/6 Monitor for excessive diarrhea--nursing requested to alert me if has Diarrhea chronic urinary retention Prostate cancer PSA 2268 Underwent cystoscopy bilateral stent exchange and suprapubic 12/21 Continue  enzalutamide and ADT per urology. follow-up with Dr. Alinda Money outpatient Anemia ? Cause Transfused 1 U on admit Transfuse 1 more unit 1/5   Hemoccult is +, poor candidate at this stage for any scope in the outpatient setting,consider work-up for this as he is not having overtly bloody stools Chronic dysphagia Risk for aspiration TPN since 1/7 Was supposed to get PEG tube -await reevaluation-by general surgery 1/9 for consideration of this Continue dysphagia 1 diet  patient refused NG tube placement on 1/6 Acute renal failure superimposed on CKD 3B on admission peak creatinine 6.0 Acidosis of renal failure AKI is resolved Mixed alkalosis and acidosis picture secondary to unknown causes Continue TPN replace micronutrients as per TPN Chronic sinus tachycardia Likely from underlying cancer state in addition to failure to thrive Continue Toprol-XL 25 daily Decubitus ulcer present on admission, left heel pressure injury Place Prevalon boots-turn as tolerated-follow-up wound nurse input "continue the heel foam dressing, changing every 3 days but assessing daily. The unstageable sacral wound we will add Santyl in a nickel thick layer over the wound, followed by a moistened saline gauze, dry gauze and sacral foam dressing. Apply the Santyl daily with dressing change. The thoracic wound on the lower mid back needs a foam dressing, change every 3 days and assess daily. Continue to paint the great toes with betadine each shift and allow to air dry"  DVT prophylaxis: Foot pumps bilaterally Code Status: Full code confirmed  Family Communication: called and d/w brother Horton [place]pe  [phone] last updated 1/8 Disposition:  Status is: Inpatient  Remains inpatient appropriate because:     Consultants:  Urology  Procedures:   Antimicrobials:     Subjective:  Refused diet and x2 according to nursing staff More coherent and interactive Does not seem to be in  pain    Objective: Vitals:   11/21/21 2049 11/22/21 0500 11/22/21 0519 11/22/21 0853  BP: (!) 107/48  [name]  122/57 (!) 107/42  Pulse: 74  78 71  Resp: (!) 22  (!) 22   Temp: 99.4 F (37.4 C)  98.5 F (36.9 C)   TempSrc: Oral  Oral   SpO2: 98%  95% 100%  Weight:  84 kg    Height:        Intake/Output Summary (Last 24 hours) at 11/22/2021 1000 Last data filed at 11/22/2021 0800 Gross per 24 hour  Intake 2298.97 ml  Output 2900 ml  Net -601.03 ml    Filed Weights   11/16/21 0334 11/19/21 1535 11/22/21 0500  Weight: 75.9 kg 84 kg 84 kg    Examination:  Interactive more coherent No icterus no pallor Anteriorly chest is clear, no added sound Abd soft, has Suprapubic  Mild but decreased Le edema Power seems intact --Psych seems slightly flat Could not examine his decubitus--see wound care nurses note with regard to plan  Data Reviewed: personally reviewed   CBC    Component Value Date/Time   WBC 5.4 11/20/2021 0521   RBC 2.61 (L) 11/20/2021 0521   HGB 8.1 (L) 11/20/2021 0521   HCT 26.8 (L) 11/20/2021 0521   PLT 208 11/20/2021 0521   MCV 102.7 (H) 11/20/2021 0521   MCH 31.0 11/20/2021 0521   MCHC 30.2 11/20/2021 0521   RDW 21.3 (H) 11/20/2021 0521   LYMPHSABS 1.5 11/20/2021 0521   MONOABS 0.5 11/20/2021 0521   EOSABS 0.4 11/20/2021 0521   BASOSABS 0.0 11/20/2021 0521   CMP Latest Ref Rng & Units 11/22/2021 11/21/2021 11/20/2021  Glucose 70 - 99 mg/dL 112(H) 95 119(H)  BUN 8 - 23 mg/dL 23 11 11   Creatinine 0.61 - 1.24 mg/dL 1.22 1.36(H) 1.36(H)  Sodium 135 - 145 mmol/L 140 139 142  Potassium 3.5 - 5.1 mmol/L 4.6 4.5 4.2  Chloride 98 - 111 mmol/L 116(H) 120(H) 121(H)  CO2 22 - 32 mmol/L 19(L) 16(L) 17(L)  Calcium 8.9 - 10.3 mg/dL 7.4(L) 7.5(L) 7.8(L)  Total Protein 6.5 - 8.1 g/dL 5.3(L) - 5.1(L)  Total Bilirubin 0.3 - 1.2 mg/dL 0.3 - 0.3  Alkaline Phos 38 - 126 U/L 312(H) - 246(H)  AST 15 - 41 U/L 16 - 18  ALT 0 - 44 U/L 12 - 13   Radiology Studies: No results  found.   Scheduled Meds:  (feeding supplement) PROSource Plus  30 mL Oral BID BM   chlorhexidine  15 mL Mouth Rinse BID   Chlorhexidine Gluconate Cloth  6 each Topical Daily   collagenase   Topical Daily   feeding supplement  237 mL Oral BID BM   insulin aspart  0-9 Units Subcutaneous Q6H   liver oil-zinc oxide   Topical Q4H while awake   mouth rinse  15 mL Mouth Rinse q12n4p   metoprolol succinate  25 mg Oral Daily   sodium chloride flush  10-40 mL Intracatheter Q12H   Continuous Infusions:  sodium chloride Stopped (11/04/21 0043)   TPN ADULT (ION) 90 mL/hr at 11/22/21 0500   TPN ADULT (ION)       LOS: 19 days   Time spent: 15  Nita Sells, MD [facility] To contact the attending provider between 7A-7P or the covering provider during after hours 7P-7A, please log into the web site [email or web address] and access using universal [place] password for that web site. If you do not have the password, please call the hospital operator.  11/22/2021, 10:00 AM

## 2021-11-22 NOTE — Consult Note (Addendum)
[place] Nurse wound follow up Wound type: DTPI right heel that is deep purple and measures 5 cm x 6 cm Thoracic wound that is 100% red and measures 0.2 x 1 cm  Sacral wound is and unstageable that measures 1 cm x 0.3 cm x 0.1 cm and is 100% yellow Bilateral great toes are pink in color at the tips  Dressing procedure/placement/frequency: We will discontinue the Xeroform gauze to the right heel and continue the heel foam dressing, changing every 3 days but assessing daily. The unstageable sacral wound we will add Santyl in a nickel thick layer over the wound, followed by a moistened saline gauze, dry gauze and sacral foam dressing. Apply the Santyl daily with dressing change. The thoracic wound on the lower mid back needs a foam dressing, change every 3 days and assess daily. Continue to paint the great toes with betadine each shift and allow to air dry.  Monitor the wound area(s) for worsening of condition such as: Signs/symptoms of infection, increase in size, development of or worsening of odor, development of pain, or increased pain at the affected locations.   Notify the medical team if any of these develop.  Thank you for the consult. [place] nurse will follow weekly Please re-consult the [place] team if needed.  Cathlean Marseilles Tamala Julian, MSN, RN, [place], Lysle Pearl, [facility] Wound Treatment Associate Pager [phone]

## 2021-11-23 ENCOUNTER — Encounter (HOSPITAL_COMMUNITY): Payer: Self-pay | Admitting: Internal Medicine

## 2021-11-23 ENCOUNTER — Inpatient Hospital Stay (HOSPITAL_COMMUNITY): Payer: Medicare Other | Admitting: Certified Registered Nurse Anesthetist

## 2021-11-23 ENCOUNTER — Inpatient Hospital Stay (HOSPITAL_COMMUNITY): Payer: Medicare Other

## 2021-11-23 ENCOUNTER — Encounter (HOSPITAL_COMMUNITY)
Admission: EM | Disposition: A | Discharge status: Skilled Nursing Facility | Payer: Self-pay | Source: Ambulatory Visit | Attending: Internal Medicine

## 2021-11-23 DIAGNOSIS — G9341 Metabolic encephalopathy: Secondary | ICD-10-CM | POA: Diagnosis not present

## 2021-11-23 DIAGNOSIS — R6521 Severe sepsis with septic shock: Secondary | ICD-10-CM | POA: Diagnosis not present

## 2021-11-23 DIAGNOSIS — A419 Sepsis, unspecified organism: Secondary | ICD-10-CM | POA: Diagnosis not present

## 2021-11-23 DIAGNOSIS — N390 Urinary tract infection, site not specified: Secondary | ICD-10-CM | POA: Diagnosis not present

## 2021-11-23 HISTORY — PX: LAPAROSCOPIC GASTROSTOMY: SHX5896

## 2021-11-23 LAB — LACTIC ACID, PLASMA
Lactic Acid, Venous: 1.2 mmol/L (ref 0.5–1.9)
Lactic Acid, Venous: 2.7 mmol/L (ref 0.5–1.9)

## 2021-11-23 LAB — GLUCOSE, CAPILLARY
Glucose-Capillary: 121 mg/dL — ABNORMAL HIGH (ref 70–99)
Glucose-Capillary: 123 mg/dL — ABNORMAL HIGH (ref 70–99)
Glucose-Capillary: 95 mg/dL (ref 70–99)
Glucose-Capillary: 153 mg/dL — ABNORMAL HIGH (ref 70–99)
Glucose-Capillary: 256 mg/dL — ABNORMAL HIGH (ref 70–99)
Glucose-Capillary: 296 mg/dL — ABNORMAL HIGH (ref 70–99)

## 2021-11-23 LAB — BLOOD GAS, ARTERIAL
Acid-base deficit: 5.7 mmol/L — ABNORMAL HIGH (ref 0.0–2.0)
Bicarbonate: 20.7 mmol/L (ref 20.0–28.0)
Drawn by: 31394
FIO2: 100
MECHVT: 560 mL
O2 Saturation: 99.5 %
PEEP: 5 cmH2O
Patient temperature: 97.5
RATE: 18 resp/min
pCO2 arterial: 46.2 mmHg (ref 32.0–48.0)
pH, Arterial: 7.269 — ABNORMAL LOW (ref 7.350–7.450)
pO2, Arterial: 314 mmHg — ABNORMAL HIGH (ref 83.0–108.0)

## 2021-11-23 LAB — COMPREHENSIVE METABOLIC PANEL
ALT: 13 U/L (ref 0–44)
ALT: 14 U/L (ref 0–44)
AST: 18 U/L (ref 15–41)
AST: 20 U/L (ref 15–41)
Albumin: 1.9 g/dL — ABNORMAL LOW (ref 3.5–5.0)
Albumin: 1.9 g/dL — ABNORMAL LOW (ref 3.5–5.0)
Alkaline Phosphatase: 333 U/L — ABNORMAL HIGH (ref 38–126)
Alkaline Phosphatase: 344 U/L — ABNORMAL HIGH (ref 38–126)
Anion gap: 3 — ABNORMAL LOW (ref 5–15)
Anion gap: 6 (ref 5–15)
BUN: 35 mg/dL — ABNORMAL HIGH (ref 8–23)
BUN: 39 mg/dL — ABNORMAL HIGH (ref 8–23)
CO2: 20 mmol/L — ABNORMAL LOW (ref 22–32)
CO2: 23 mmol/L (ref 22–32)
Calcium: 7.4 mg/dL — ABNORMAL LOW (ref 8.9–10.3)
Calcium: 7.7 mg/dL — ABNORMAL LOW (ref 8.9–10.3)
Chloride: 115 mmol/L — ABNORMAL HIGH (ref 98–111)
Chloride: 108 mmol/L (ref 98–111)
Creatinine, Ser: 1.29 mg/dL — ABNORMAL HIGH (ref 0.61–1.24)
Creatinine, Ser: 1.38 mg/dL — ABNORMAL HIGH (ref 0.61–1.24)
GFR, Estimated: 54 mL/min — ABNORMAL LOW (ref >60)
GFR, Estimated: 50 mL/min — ABNORMAL LOW (ref >60)
Glucose, Bld: 115 mg/dL — ABNORMAL HIGH (ref 70–99)
Glucose, Bld: 313 mg/dL — ABNORMAL HIGH (ref 70–99)
Potassium: 4.3 mmol/L (ref 3.5–5.1)
Potassium: 5.3 mmol/L — ABNORMAL HIGH (ref 3.5–5.1)
Sodium: 138 mmol/L (ref 135–145)
Sodium: 137 mmol/L (ref 135–145)
Total Bilirubin: 0.3 mg/dL (ref 0.3–1.2)
Total Bilirubin: 0.3 mg/dL (ref 0.3–1.2)
Total Protein: 5.7 g/dL — ABNORMAL LOW (ref 6.5–8.1)
Total Protein: 5.8 g/dL — ABNORMAL LOW (ref 6.5–8.1)

## 2021-11-23 LAB — CBC
HCT: 24.6 % — ABNORMAL LOW (ref 39.0–52.0)
HCT: 26.2 % — ABNORMAL LOW (ref 39.0–52.0)
Hemoglobin: 7.9 g/dL — ABNORMAL LOW (ref 13.0–17.0)
Hemoglobin: 7.9 g/dL — ABNORMAL LOW (ref 13.0–17.0)
MCH: 32 pg (ref 26.0–34.0)
MCH: 31.5 pg (ref 26.0–34.0)
MCHC: 32.1 g/dL (ref 30.0–36.0)
MCHC: 30.2 g/dL (ref 30.0–36.0)
MCV: 99.6 fL (ref 80.0–100.0)
MCV: 104.4 fL — ABNORMAL HIGH (ref 80.0–100.0)
Platelets: 198 10*3/uL (ref 150–400)
Platelets: 217 K/uL (ref 150–400)
RBC: 2.47 MIL/uL — ABNORMAL LOW (ref 4.22–5.81)
RBC: 2.51 MIL/uL — ABNORMAL LOW (ref 4.22–5.81)
RDW: 19.4 % — ABNORMAL HIGH (ref 11.5–15.5)
RDW: 19 % — ABNORMAL HIGH (ref 11.5–15.5)
WBC: 5.4 10*3/uL (ref 4.0–10.5)
WBC: 9.4 K/uL (ref 4.0–10.5)
nRBC: 0 % (ref 0.0–0.2)
nRBC: 0 % (ref 0.0–0.2)

## 2021-11-23 LAB — PHOSPHORUS: Phosphorus: 3.4 mg/dL (ref 2.5–4.6)

## 2021-11-23 LAB — TYPE AND SCREEN
ABO/RH(D): O POS
Antibody Screen: NEGATIVE

## 2021-11-23 LAB — MAGNESIUM: Magnesium: 2 mg/dL (ref 1.7–2.4)

## 2021-11-23 SURGERY — CREATION, GASTROSTOMY, LAPAROSCOPIC
Anesthesia: General | Site: Abdomen

## 2021-11-23 MED ORDER — TRAVASOL 10 % IV SOLN
INTRAVENOUS | Status: AC
  Filled 2021-11-23: qty 1123.2

## 2021-11-23 MED ORDER — PHENYLEPHRINE HCL (PRESSORS) 10 MG/ML IV SOLN
INTRAVENOUS | Status: AC
  Filled 2021-11-23: qty 1

## 2021-11-23 MED ORDER — OSMOLITE 1.5 CAL PO LIQD
1000.0000 mL | ORAL | Status: DC
  Filled 2021-11-23: qty 1000

## 2021-11-23 MED ORDER — LACTATED RINGERS IV SOLN: INTRAVENOUS | Status: DC

## 2021-11-23 MED ORDER — FENTANYL CITRATE PF 50 MCG/ML IJ SOSY: 25.0000 ug | PREFILLED_SYRINGE | INTRAMUSCULAR | Status: DC | PRN

## 2021-11-23 MED ORDER — ROCURONIUM BROMIDE 10 MG/ML (PF) SYRINGE
PREFILLED_SYRINGE | INTRAVENOUS | Status: DC | PRN
Start: 2021-11-23 — End: 2021-11-23
  Administered 2021-11-23: 80 mg via INTRAVENOUS

## 2021-11-23 MED ORDER — OSMOLITE 1.5 CAL PO LIQD
1000.0000 mL | ORAL | Status: DC
  Administered 2021-11-24: 1000 mL
  Filled 2021-11-23 (×2): qty 1000

## 2021-11-23 MED ORDER — ROCURONIUM BROMIDE 10 MG/ML (PF) SYRINGE
PREFILLED_SYRINGE | INTRAVENOUS | Status: AC
  Filled 2021-11-23: qty 10

## 2021-11-23 MED ORDER — DOCUSATE SODIUM 50 MG/5ML PO LIQD
100.0000 mg | Freq: Two times a day (BID) | ORAL | Status: DC
  Filled 2021-11-23: qty 10

## 2021-11-23 MED ORDER — FENTANYL CITRATE (PF) 100 MCG/2ML IJ SOLN: 25.0000 ug | INTRAMUSCULAR | Status: DC | PRN

## 2021-11-23 MED ORDER — DEXMEDETOMIDINE HCL IN NACL 200 MCG/50ML IV SOLN: 0.0000 ug/kg/h | INTRAVENOUS | Status: DC

## 2021-11-23 MED ORDER — LIDOCAINE HCL (PF) 2 % IJ SOLN
INTRAMUSCULAR | Status: AC
  Filled 2021-11-23: qty 5

## 2021-11-23 MED ORDER — PANTOPRAZOLE SODIUM 40 MG IV SOLR
40.0000 mg | INTRAVENOUS | Status: DC
  Administered 2021-11-23 – 2021-11-24 (×2): 40 mg via INTRAVENOUS
  Filled 2021-11-23 (×2): qty 40

## 2021-11-23 MED ORDER — PHENYLEPHRINE HCL-NACL 20-0.9 MG/250ML-% IV SOLN
INTRAVENOUS | Status: DC | PRN
Start: 2021-11-23 — End: 2021-11-23
  Administered 2021-11-23: 40 ug/min via INTRAVENOUS

## 2021-11-23 MED ORDER — DEXAMETHASONE SODIUM PHOSPHATE 10 MG/ML IJ SOLN
INTRAMUSCULAR | Status: DC | PRN
  Administered 2021-11-23: 5 mg via INTRAVENOUS

## 2021-11-23 MED ORDER — PROPOFOL 10 MG/ML IV BOLUS
INTRAVENOUS | Status: AC
  Filled 2021-11-23: qty 20

## 2021-11-23 MED ORDER — SUGAMMADEX SODIUM 200 MG/2ML IV SOLN
INTRAVENOUS | Status: DC | PRN
  Administered 2021-11-23: 200 mg via INTRAVENOUS

## 2021-11-23 MED ORDER — FREE WATER
100.0000 mL | Freq: Four times a day (QID) | Status: DC
  Administered 2021-11-24 – 2021-11-25 (×4): 100 mL

## 2021-11-23 MED ORDER — FENTANYL CITRATE (PF) 100 MCG/2ML IJ SOLN
INTRAMUSCULAR | Status: AC
  Filled 2021-11-23: qty 2

## 2021-11-23 MED ORDER — NOREPINEPHRINE 4 MG/250ML-% IV SOLN
0.0000 ug/min | INTRAVENOUS | Status: DC
  Administered 2021-11-23: 1 ug/min via INTRAVENOUS
  Administered 2021-11-24: 11:00:00 3 ug/min via INTRAVENOUS
  Administered 2021-11-25: 5 ug/min via INTRAVENOUS
  Administered 2021-11-25 – 2021-11-26 (×2): 6 ug/min via INTRAVENOUS
  Filled 2021-11-23 (×5): qty 250

## 2021-11-23 MED ORDER — SODIUM CHLORIDE 0.9 % IV SOLN
3.0000 g | Freq: Three times a day (TID) | INTRAVENOUS | Status: AC
  Administered 2021-11-24 – 2021-11-28 (×15): 3 g via INTRAVENOUS
  Filled 2021-11-23 (×15): qty 8

## 2021-11-23 MED ORDER — NALOXONE HCL 0.4 MG/ML IJ SOLN
INTRAMUSCULAR | Status: AC
  Filled 2021-11-23: qty 1

## 2021-11-23 MED ORDER — CEFAZOLIN SODIUM-DEXTROSE 2-3 GM-%(50ML) IV SOLR
INTRAVENOUS | Status: DC | PRN
  Administered 2021-11-23: 2 g via INTRAVENOUS

## 2021-11-23 MED ORDER — FENTANYL CITRATE (PF) 100 MCG/2ML IJ SOLN
25.0000 ug | INTRAMUSCULAR | Status: DC | PRN
  Administered 2021-11-23: 100 ug via INTRAVENOUS
  Filled 2021-11-23: qty 2

## 2021-11-23 MED ORDER — ACETAMINOPHEN 500 MG PO TABS: 1000.0000 mg | ORAL_TABLET | Freq: Once | ORAL | Status: DC

## 2021-11-23 MED ORDER — POLYETHYLENE GLYCOL 3350 17 G PO PACK
17.0000 g | PACK | Freq: Every day | ORAL | Status: DC
  Filled 2021-11-23: qty 1

## 2021-11-23 MED ORDER — PROSOURCE TF PO LIQD
45.0000 mL | Freq: Two times a day (BID) | ORAL | Status: DC
  Administered 2021-11-24 – 2021-11-25 (×2): 45 mL
  Filled 2021-11-23: qty 45

## 2021-11-23 MED ORDER — FENTANYL CITRATE (PF) 100 MCG/2ML IJ SOLN
INTRAMUSCULAR | Status: DC | PRN
Start: 2021-11-23 — End: 2021-11-23
  Administered 2021-11-23: 50 ug via INTRAVENOUS

## 2021-11-23 MED ORDER — CEFAZOLIN SODIUM-DEXTROSE 2-4 GM/100ML-% IV SOLN
INTRAVENOUS | Status: AC
  Filled 2021-11-23: qty 100

## 2021-11-23 MED ORDER — ONDANSETRON HCL 4 MG/2ML IJ SOLN
INTRAMUSCULAR | Status: DC | PRN
  Administered 2021-11-23: 4 mg via INTRAVENOUS

## 2021-11-23 MED ORDER — PHENYLEPHRINE 40 MCG/ML (10ML) SYRINGE FOR IV PUSH (FOR BLOOD PRESSURE SUPPORT)
PREFILLED_SYRINGE | INTRAVENOUS | Status: DC | PRN
  Administered 2021-11-23: 200 ug via INTRAVENOUS

## 2021-11-23 MED ORDER — PROPOFOL 10 MG/ML IV BOLUS
INTRAVENOUS | Status: DC | PRN
  Administered 2021-11-23: 60 mg via INTRAVENOUS

## 2021-11-23 MED ORDER — LACTATED RINGERS IV BOLUS
1000.0000 mL | Freq: Once | INTRAVENOUS | Status: AC
  Administered 2021-11-23: 1000 mL via INTRAVENOUS

## 2021-11-23 MED FILL — Medication: Qty: 1 | Status: AC

## 2021-11-23 SURGICAL SUPPLY — 53 items
BAG COUNTER SPONGE SURGICOUNT (BAG) IMPLANT
CABLE HIGH FREQUENCY MONO STRZ (ELECTRODE) ×2 IMPLANT
CHLORAPREP W/TINT 26 (MISCELLANEOUS) ×2 IMPLANT
COVER SURGICAL LIGHT HANDLE (MISCELLANEOUS) ×2 IMPLANT
DECANTER SPIKE VIAL GLASS SM (MISCELLANEOUS) ×2 IMPLANT
DERMABOND ADVANCED (GAUZE/BANDAGES/DRESSINGS)
DERMABOND ADVANCED .7 DNX12 (GAUZE/BANDAGES/DRESSINGS) IMPLANT
DEVICE SUTURE ENDOST 10MM (ENDOMECHANICALS) IMPLANT
DRAIN CHANNEL 19F RND (DRAIN) IMPLANT
DRAIN PENROSE 0.5X18 (DRAIN) IMPLANT
DRAPE LAPAROSCOPIC ABDOMINAL (DRAPES) ×2 IMPLANT
ELECT REM PT RETURN 15FT ADLT (MISCELLANEOUS) ×2 IMPLANT
EVACUATOR SILICONE 100CC (DRAIN) IMPLANT
GAUZE SPONGE 4X4 12PLY STRL (GAUZE/BANDAGES/DRESSINGS) IMPLANT
GLOVE SURG POLYISO LF SZ7 (GLOVE) ×2 IMPLANT
GLOVE SURG UNDER POLY LF SZ7 (GLOVE) ×2 IMPLANT
GOWN STRL REUS W/TWL LRG LVL3 (GOWN DISPOSABLE) ×2 IMPLANT
GOWN STRL REUS W/TWL XL LVL3 (GOWN DISPOSABLE) ×4 IMPLANT
GRASPER SUT TROCAR 14GX15 (MISCELLANEOUS) IMPLANT
IRRIG SUCT STRYKERFLOW 2 WTIP (MISCELLANEOUS) ×2
IRRIGATION SUCT STRKRFLW 2 WTP (MISCELLANEOUS) ×1 IMPLANT
KIT BASIN OR (CUSTOM PROCEDURE TRAY) ×2 IMPLANT
KIT INTRO ENDO 22F F/GAST TUBE (SET/KITS/TRAYS/PACK) IMPLANT
KIT INTRODUCER MIC G-18 (SET/KITS/TRAYS/PACK) IMPLANT
KIT TURNOVER KIT A (KITS) IMPLANT
PAD POSITIONING PINK XL (MISCELLANEOUS) IMPLANT
PENCIL SMOKE EVACUATOR (MISCELLANEOUS) IMPLANT
RELOAD ENDO STITCH 2.0 (ENDOMECHANICALS)
RELOAD SUT SNGL STCH BLK 2-0 (ENDOMECHANICALS) IMPLANT
SCISSORS LAP 5X35 DISP (ENDOMECHANICALS) ×2 IMPLANT
SET TUBE SMOKE EVAC HIGH FLOW (TUBING) ×2 IMPLANT
SHEARS HARMONIC ACE PLUS 36CM (ENDOMECHANICALS) ×2 IMPLANT
SLEEVE XCEL OPT CAN 5 100 (ENDOMECHANICALS) ×4 IMPLANT
SPONGE DRAIN TRACH 4X4 STRL 2S (GAUZE/BANDAGES/DRESSINGS) ×1 IMPLANT
SUT ETHILON 3 0 PS 1 (SUTURE) IMPLANT
SUT MNCRL AB 4-0 PS2 18 (SUTURE) ×2 IMPLANT
SUT RELOAD ENDO STITCH 2.0 (ENDOMECHANICALS)
SUT SILK 2 0 SH (SUTURE) IMPLANT
SUT SILK 3 0 SH 30 (SUTURE) IMPLANT
SUT SURGIDAC NAB ES-9 0 48 120 (SUTURE) IMPLANT
SUT VIC AB 2-0 SH 27 (SUTURE)
SUT VIC AB 2-0 SH 27X BRD (SUTURE) IMPLANT
SUT VIC AB 3-0 SH 27 (SUTURE)
SUT VIC AB 3-0 SH 27XBRD (SUTURE) IMPLANT
SUT VICRYL 0 TIES 12 18 (SUTURE) IMPLANT
SUTURE RELOAD ENDO STITCH 2.0 (ENDOMECHANICALS) IMPLANT
TOWEL OR 17X26 10 PK STRL BLUE (TOWEL DISPOSABLE) ×2 IMPLANT
TOWEL OR NON WOVEN STRL DISP B (DISPOSABLE) ×2 IMPLANT
TRAY LAPAROSCOPIC (CUSTOM PROCEDURE TRAY) ×2 IMPLANT
TROCAR BLADELESS OPT 5 100 (ENDOMECHANICALS) ×2 IMPLANT
TROCAR XCEL 12X100 BLDLESS (ENDOMECHANICALS) ×2 IMPLANT
TUBE ENDOVIVE SAFETY PEG 22FR (TUBING) ×1 IMPLANT
TUBE GASTROSTOMY 18F (CATHETERS) IMPLANT

## 2021-11-23 NOTE — Progress Notes (Signed)
Nutrition Follow-up  DOCUMENTATION CODES:   Non-severe (moderate) malnutrition in context of chronic illness  INTERVENTION:  - continue TPN/initiation of TPN weaning after TF initiation per Pharmacist. - continue 30 ml Prosource Plus BID and Ensure Enlive BID once diet re-advanced.   - once G-tube placed: Osmolite 1.5 @ 20 ml/hr x14 hours/day (1800-0800) to increase by 10 ml every 14 hours of run time to reach goal rate of 70 ml/hr with 45 ml Prosource TF BID and 100 ml free water QID. - at goal rate, this regimen will provide 1550 kcal (74% kcal need), 83 grams protein (79% protein need), and 1149 ml free water.   NUTRITION DIAGNOSIS:   Moderate Malnutrition related to chronic illness, cancer and cancer related treatments as evidenced by mild fat depletion, moderate muscle depletion. -ongoing  GOAL:   Patient will meet greater than or equal to 90% of their needs -met with TPN at this time  MONITOR:   PO intake, Supplement acceptance, TF tolerance, Labs, Weight trends, Other (Comment) (TPN regimen)   ASSESSMENT:   88 yo male with medical history of CKD, HTN, heart failure, dementia, and prostate cancer. Of note, he was hospitalized in 08/2021 with sepsis and AKI on CKD 2/2 obstruction related to metastatic prostate cancer. Patient was admitted for septic shock 2/2 UTI.  Significant Events: 12/22- admission 12/31- initial RD assessment for Calorie Count 1/7- double lumen PICC placed in R basilic; TPN initiation  Patient has had multiple episodes of NPO status over the past 1 week. He is currently laying in bed with no visitors present and patient is sleeping/has eyes closed. He has been NPO since midnight in anticipation for G-tube placement by Surgery team; he is not a candidate for PEG by IR d/t anatomy.   From 1/6-1/9 he was eating 5-30% of meals on Dysphagia 1, nectar-thick liquids diet. Since 1/6 he has been accepting Prosource Plus 100% of the time offered.   Order in  place in [facility] for Hormel Shake TID. Order placed for Ensure Enlive BID starting on 1/6 and he has been accepting this supplement ~50% of the time offered.   He is receiving custom TPN at goal rate of 90 ml/hr which is providing, per Pharmacist's note today, ~2100 kcal and 112 grams protein.   Weight on 1/6 was 185 lb and was the highest recorded weight this hospitalization. This weight appears to have been copied forward to 1/9. Non-pitting edema to all extremities documented in the edema section of flow sheet.     Labs reviewed; CBGs: 121 and 123 mg/dl, Cl: 115 mmol/l, BUN: 35 mg/dl, creatinine: 1.29 mg/dl, Ca: 7.4 mg/dl, Alk Phos elevated, GFR: 54 ml/min.   Medications reviewed; sliding scale novolog.   Diet Order:   Diet Order             Diet NPO time specified  Diet effective midnight                   EDUCATION NEEDS:   Not appropriate for education at this time  Skin:  Skin Assessment: Skin Integrity Issues: Skin Integrity Issues:: Stage III, DTI, Stage II, Stage I DTI: R heel Stage I: R toe Stage II: vertebral column Stage III: sacrum  Last BM:  1/8 (type 7, medium amount)  Height:   Ht Readings from Last 1 Encounters:  11/03/21 '5\' 9"'  (1.753 m)    Weight:   Wt Readings from Last 1 Encounters:  11/22/21 84 kg  Estimated Nutritional Needs:  Kcal:  2100-2300 kcal Protein:  105-120 grams Fluid:  >/= 2.3 L/day      Jarome Matin, MS, RD, LDN Inpatient Clinical Dietitian RD pager # available in [place]  After hours/weekend pager # available in [facility]

## 2021-11-23 NOTE — Anesthesia Procedure Notes (Signed)
Procedure Name: Intubation Date/Time: 11/23/2021 1:48 PM Performed by: Raenette Rover, CRNA Pre-anesthesia Checklist: Patient identified, Emergency Drugs available, Suction available and Patient being monitored Patient Re-evaluated:Patient Re-evaluated prior to induction Oxygen Delivery Method: Circle system utilized Preoxygenation: Pre-oxygenation with 100% oxygen Induction Type: IV induction Ventilation: Mask ventilation without difficulty Laryngoscope Size: Miller and 3 Grade View: Grade I Tube type: Oral Tube size: 7.5 mm Number of attempts: 1 Airway Equipment and Method: Stylet Placement Confirmation: ETT inserted through vocal cords under direct vision, positive ETCO2 and breath sounds checked- equal and bilateral Secured at: 23 cm Tube secured with: Tape Dental Injury: Teeth and Oropharynx as per pre-operative assessment  Comments: Pt induced on 2 pillows and a cradle foam pillow for patient's comfort and because patient is severely contracted. Glidescope available in room but not needed.

## 2021-11-23 NOTE — Transfer of Care (Signed)
Immediate Anesthesia Transfer of Care Note  Patient: Anthony Dudley  Procedure(s) Performed: gastrostomy tube placement endoscopy (Abdomen)  Patient Location: PACU  Anesthesia Type:General  Level of Consciousness: awake  Airway & Oxygen Therapy: Patient Spontanous Breathing and Patient connected to face mask oxygen  Post-op Assessment: Report given to RN and Post -op Vital signs reviewed and stable  Post vital signs: Reviewed and stable  Last Vitals:  Vitals Value Taken Time  BP 118/61 11/23/21 1625  Temp    Pulse 85 11/23/21 1628  Resp 22 11/23/21 1628  SpO2 100 % 11/23/21 1628  Vitals shown include unvalidated device data.  Last Pain:  Vitals:   11/23/21 1434  TempSrc: Oral  PainSc:       Patients Stated Pain Goal: 0 (03/70/96 4383)  Complications: No notable events documented.

## 2021-11-23 NOTE — Op Note (Signed)
Preoperative diagnosis: dysphagia  Postoperative diagnosis: same   Procedure: endoscopic placement of gastrostomy by pull technique  Surgeon: Gurney Maxin, M.D.  Asst: Radonna Ricker, M.D.  Anesthesia: general  Indications for procedure: Anthony Dudley is a 88 y.o. year old male with symptoms of dysphagia presented for gastrostomy tube insertion.  Description of procedure: The patient was brought into the operative suite. Anesthesia was administered with General endotracheal anesthesia. WHO checklist was applied. The patient was then placed in supine position. The area was prepped and draped in the usual sterile fashion.  Next, the endoscope was intubated into the mouth and slowly advanced though the pharynx and esophagus. The stomach was identified. Transillumination was used to identify a place in the left upper quadrant area of the abdomen.   An incision was made at the point of transillumination. A 14ga needle was used to gain access to the stomach in the distal body. A looped wire was introduced and ensured by endoscopic snare. The looped wire was secured to he 18 fr gastrostomy tube and brought back through the mouth and esophagus and out the skin incision. The endoscope was reinserted to confirm the mushroom was against the stomach and abdominal wall. The wafer was secured at 3 cm. The patient tolerated the procedure well. All counts were correct.  Findings: normal stomach anatomy  Specimen: none  Implant: 18 fr gastrostomy tube   Blood loss: < 10 ml  Local anesthesia: none  Complications: none  Gurney Maxin, M.D. General, Bariatric, & Minimally Invasive Surgery [facility]

## 2021-11-23 NOTE — Significant Event (Signed)
Rapid Response Event Note   Reason for Call :  Patient not responding, cold, clammy, not breathing well.  Initial Focused Assessment:  Arrived to bedside to find patient with HR in 30's and respiratory arrest.    Interventions:  Code Blue called and intubated at bedside. Atropine given. Transported to ICU room 1231.   Plan of Care:  Patient transferred to ICU status and MD coming to bedside.   Selinda Michaels, RN

## 2021-11-23 NOTE — Anesthesia Postprocedure Evaluation (Signed)
Anesthesia Post Note  Patient: Renee Harder  Procedure(s) Performed: gastrostomy tube placement endoscopy (Abdomen)     Patient location during evaluation: PACU Anesthesia Type: General Level of consciousness: awake and alert Pain management: pain level controlled Vital Signs Assessment: post-procedure vital signs reviewed and stable Respiratory status: spontaneous breathing, nonlabored ventilation, respiratory function stable and patient connected to nasal cannula oxygen Cardiovascular status: blood pressure returned to baseline and stable Postop Assessment: no apparent nausea or vomiting Anesthetic complications: no   No notable events documented.  Last Vitals:  Vitals:   11/23/21 1700 11/23/21 1723  BP: 130/75 (!) 159/93  Pulse: 100 (!) 110  Resp: (!) 24 20  Temp:  (!) 36.4 C  SpO2: 96% 90%    Last Pain:  Vitals:   11/23/21 1723  TempSrc: Axillary  PainSc:                  Nekeshia Lenhardt L Moneisha Vosler

## 2021-11-23 NOTE — Progress Notes (Signed)
SLP Cancellation Note  Patient Details Name: Avon Mergenthaler MRN: [id number] DOB: 08/08/1933   Cancelled treatment:       Reason Eval/Treat Not Completed: Other (comment);Patient at procedure or test/unavailable (pt having feeding tube placed by surgery at this time)  Kathleen Lime, MS [place] Office [phone] Cell [phone]  Macario Golds 11/23/2021, 3:28 PM

## 2021-11-23 NOTE — Progress Notes (Signed)
PCCM Interval Note  Levophed restarted.  Septic shock 2/2 aspiration pneumonia Give additional 1L LR bolus  Start Unasyn

## 2021-11-23 NOTE — Progress Notes (Signed)
PROGRESS NOTE   Anthony Dudley  SEG:[id number] DOB: 12-12-1932 DOA: 11/03/2021 PCP: [name], MD  Brief Narrative:   88 year old male Known metastatic prostate cancer with chronic indwelling Foley CKD 3 Chronic bedbound status HFpEF SVT previously treated with amiodarone  Recent lengthy hospitalization 10/4 through 09/06/2021 secondary to sepsis-patient was found to have hydronephrosis underwent cystoscopy fulguration bilateral PCN and bilateral nephrostomy tube placement by IR 08/2017  Patient was readmitted by critical care service 11/03/2021-found to be pyrexia tachycardic and in urinary retention Received pressors and was sent to the ICU He was resuscitated and transferred to [facility] 12/25 12/29 cystoscopy bilateral stent exchange and placement of suprapubic catheter Recommended dysphagia 1 diet but failed calorie count 1/3 General surgery consulted to place feeding tube as could not be placed by IR secondary to anatomy TPN was started 1/7 after PICC was placed  Eventually PEG was placed on 1/10--will grad feeds, ensure patient taking in adequate calories and can d/c in several days--may need SNF   Hospital-Problem based course  Sepsis from prior to admission likely combination of probable aspiration pneumonia and multidrug-resistant urinary infection [prior multidrug-resistant Proteus] Cultures negative from admission Received multiple boluses of fluid on 1/4 Received cefepime as well as Zosyn through 11/14/2021 total of 7 days Re-started Vanc/Zosyn 11/17/21 --cultures negative--antibiotics finally stopped on 1/6 Acute metabolic encephalopathy present on admission Work-up including B12 TSH EEG CT all negative Cosyntropin testing negative Mental status improved delirium resolved  Diarrhea not otherwise specifie discontinue laxatives MiraLAX and Colace 1/6 Monitor for excessive diarrhea--nursing requested to alert me if has Diarrhea chronic urinary  retention Prostate cancer PSA 2268 Underwent cystoscopy bilateral stent exchange and suprapubic 12/21 Continue enzalutamide and ADT per urology. follow-up with Dr. Alinda Money outpatient Anemia ? Cause Transfused 1 U on admit Transfuse 1 more unit 1/5   Hemoccult is +, poor candidate at this stage for any scope in the outpatient setting,consider work-up for this as he is not having overtly bloody stools Chronic dysphagia Risk for aspiration TPN since 1/7 18 Fr. PEG tube placed by general surgery 1/10 Continue dysphagia 1 diet  Fluids now being adjusted with TPN--adjust TPN downward as feeds are started Acute renal failure superimposed on CKD 3B on admission peak creatinine 6.0 Acidosis of renal failure AKI is resolved Mixed alkalosis and acidosis picture secondary to unknown causes Continue TPN replace micronutrients as per TPN Chronic sinus tachycardia Likely from underlying cancer state in addition to failure to thrive Continue Toprol-XL 25 daily Decubitus ulcer present on admission, left heel pressure injury Place Prevalon boots-turn as tolerated-follow-up wound nurse input "continue the heel foam dressing, changing every 3 days but assessing daily. The unstageable sacral wound we will add Santyl in a nickel thick layer over the wound, followed by a moistened saline gauze, dry gauze and sacral foam dressing. Apply the Santyl daily with dressing change. The thoracic wound on the lower mid back needs a foam dressing, change every 3 days and assess daily. Continue to paint the great toes with betadine each shift and allow to air dry"  DVT prophylaxis: Foot pumps bilaterally Code Status: Full code confirmed  Family Communication: called and d/w brother Therron Sells  [phone] last updated 1/10 Disposition:  Status is: Inpatient  Remains inpatient appropriate because:     Consultants:  Urology  Procedures:   Antimicrobials:     Subjective:  Coherent Tells me he consents  to procedure but then say--"I occasionally forget"  Objective: Vitals:   11/22/21 1918 11/23/21 0505 11/23/21  1212 11/23/21 1434  BP: (!) 102/50 (!) 113/51 (!) 114/50   Pulse: 97 90 79   Resp: 18 18 18    Temp: (!) 97.5 F (36.4 C) 99.8 F (37.7 C) 98.9 F (37.2 C) (!) 97.5 F (36.4 C)  TempSrc: Axillary Axillary Oral Oral  SpO2: 98% 98% 100%   Weight:      Height:        Intake/Output Summary (Last 24 hours) at 11/23/2021 1518 Last data filed at 11/23/2021 1456 Gross per 24 hour  Intake 1492.42 ml  Output 3100 ml  Net -1607.58 ml    Filed Weights   11/16/21 0334 11/19/21 1535 11/22/21 0500  Weight: 75.9 kg 84 kg 84 kg    Examination:  Interactive more coherent No icterus no pallor Anteriorly chest is clear, no added sound Abd soft, has Suprapubic  Mild but decreased Le edema Power seems intact --Psych y flat Did not examine his decubitus--see wound care nurses note with regard to plan Toes are a little blue  Data Reviewed: personally reviewed   CBC    Component Value Date/Time   WBC 5.4 11/23/2021 0312   RBC 2.47 (L) 11/23/2021 0312   HGB 7.9 (L) 11/23/2021 0312   HCT 24.6 (L) 11/23/2021 0312   PLT 198 11/23/2021 0312   MCV 99.6 11/23/2021 0312   MCH 32.0 11/23/2021 0312   MCHC 32.1 11/23/2021 0312   RDW 19.4 (H) 11/23/2021 0312   LYMPHSABS 1.5 11/20/2021 0521   MONOABS 0.5 11/20/2021 0521   EOSABS 0.4 11/20/2021 0521   BASOSABS 0.0 11/20/2021 0521   CMP Latest Ref Rng & Units 11/23/2021 11/22/2021 11/21/2021  Glucose 70 - 99 mg/dL 115(H) 112(H) 95  BUN 8 - 23 mg/dL 35(H) 23 11  Creatinine 0.61 - 1.24 mg/dL 1.29(H) 1.22 1.36(H)  Sodium 135 - 145 mmol/L 138 140 139  Potassium 3.5 - 5.1 mmol/L 4.3 4.6 4.5  Chloride 98 - 111 mmol/L 115(H) 116(H) 120(H)  CO2 22 - 32 mmol/L 20(L) 19(L) 16(L)  Calcium 8.9 - 10.3 mg/dL 7.4(L) 7.4(L) 7.5(L)  Total Protein 6.5 - 8.1 g/dL 5.7(L) 5.3(L) -  Total Bilirubin 0.3 - 1.2 mg/dL 0.3 0.3 -  Alkaline Phos 38 - 126 U/L  333(H) 312(H) -  AST 15 - 41 U/L 18 16 -  ALT 0 - 44 U/L 13 12 -   Radiology Studies: No results found.   Scheduled Meds:  [MAR Hold] (feeding supplement) PROSource Plus  30 mL Oral BID BM   acetaminophen  1,000 mg Oral Once   [MAR Hold] chlorhexidine  15 mL Mouth Rinse BID   [MAR Hold] Chlorhexidine Gluconate Cloth  6 each Topical Daily   [MAR Hold] collagenase   Topical Daily   [MAR Hold] feeding supplement  237 mL Oral BID BM   [MAR Hold] feeding supplement (PROSource TF)  45 mL Per Tube BID   [MAR Hold] free water  100 mL Per Tube QID   [MAR Hold] insulin aspart  0-9 Units Subcutaneous Q6H   [MAR Hold] liver oil-zinc oxide   Topical Q4H while awake   [MAR Hold] mouth rinse  15 mL Mouth Rinse q12n4p   [MAR Hold] metoprolol succinate  25 mg Oral Daily   [MAR Hold] sodium chloride flush  10-40 mL Intracatheter Q12H   Continuous Infusions:  sodium chloride Stopped (11/04/21 0043)   [START ON 11/24/2021] feeding supplement (OSMOLITE 1.5 CAL)     lactated ringers 50 mL/hr at 11/23/21 1449   TPN ADULT (ION)  90 mL/hr at 11/22/21 1703   TPN ADULT (ION)       LOS: 20 days   Time spent: [place], MD [facility] To contact the attending provider between 7A-7P or the covering provider during after hours 7P-7A, please log into the web site [email or web address] and access using universal [place] password for that web site. If you do not have the password, please call the hospital operator.  11/23/2021, 3:18 PM

## 2021-11-23 NOTE — Anesthesia Preprocedure Evaluation (Addendum)
Anesthesia Evaluation  Patient identified by MRN, date of birth, ID band Patient awake    Reviewed: Allergy & Precautions, NPO status , Patient's Chart, lab work & pertinent test results, reviewed documented beta blocker date and time   Airway Mallampati: III  TM Distance: >3 FB Neck ROM: Full    Dental  (+) Edentulous Upper, Edentulous Lower, Dental Advisory Given   Pulmonary neg pulmonary ROS,    Pulmonary exam normal breath sounds clear to auscultation       Cardiovascular hypertension, Pt. on home beta blockers and Pt. on medications +CHF  Normal cardiovascular exam+ dysrhythmias Supra Ventricular Tachycardia  Rhythm:Regular Rate:Normal  TTE 2022 1. Left ventricular ejection fraction, by estimation, is 55 to 60%. The  left ventricle has normal function. The left ventricle has no regional  wall motion abnormalities. There is mild left ventricular hypertrophy.  Left ventricular diastolic parameters  are indeterminate.  2. Right ventricular systolic function was not well visualized. The right  ventricular size is not well visualized. There is normal pulmonary artery  systolic pressure.  3. The mitral valve is normal in structure. Trivial mitral valve  regurgitation. No evidence of mitral stenosis.  4. The aortic valve was not well visualized. Aortic valve regurgitation  is not visualized. Mild to moderate aortic valve sclerosis/calcification  is present, without any evidence of aortic stenosis.    Neuro/Psych PSYCHIATRIC DISORDERS Anxiety Depression Dementia negative neurological ROS     GI/Hepatic Neg liver ROS, GERD  ,  Endo/Other  negative endocrine ROS  Renal/GU Renal InsufficiencyRenal diseaseLab Results      Component                Value               Date                      CREATININE               1.29 (H)            11/23/2021                BUN                      35 (H)              11/23/2021                 NA                       138                 11/23/2021                K                        4.3                 11/23/2021                CL                       115 (H)             11/23/2021                CO2  20 (L)              11/23/2021             negative genitourinary   Musculoskeletal negative musculoskeletal ROS (+)   Abdominal   Peds  Hematology  (+) Blood dyscrasia, anemia , Lab Results      Component                Value               Date                      WBC                      5.4                 11/23/2021                HGB                      7.9 (L)             11/23/2021                HCT                      24.6 (L)            11/23/2021                MCV                      99.6                11/23/2021                PLT                      198                 11/23/2021              Anesthesia Other Findings   Reproductive/Obstetrics                            Anesthesia Physical Anesthesia Plan  ASA: 3  Anesthesia Plan: General   Post-op Pain Management:    Induction: Intravenous  PONV Risk Score and Plan: 2 and Dexamethasone, Ondansetron and Treatment may vary due to age or medical condition  Airway Management Planned: Oral ETT  Additional Equipment:   Intra-op Plan:   Post-operative Plan: Extubation in OR  Informed Consent: I have reviewed the patients History and Physical, chart, labs and discussed the procedure including the risks, benefits and alternatives for the proposed anesthesia with the patient or authorized representative who has indicated his/her understanding and acceptance.     Dental advisory given  Plan Discussed with: CRNA  Anesthesia Plan Comments:         Anesthesia Quick Evaluation

## 2021-11-23 NOTE — Progress Notes (Signed)
Patient is brought to peri op for peg/ G tube placement. He is unable to tell RN his name. His brother gave verbal consent for procedure. Patient is unable to give any responses to questions asked by Probation officer.

## 2021-11-23 NOTE — Consult Note (Signed)
Anthony Dudley, MRN:  [id number], DOB:  20-Sep-1933, LOS: 31 ADMISSION DATE:  11/03/2021 CONSULTATION DATE:  11/23/2021 REFERRING MD:  Verlon Au - TRH CHIEF COMPLAINT:  Bradycardia, ?code on floor   History of Present Illness:  88 year old man who initially presented to [facility] 12/21 for fever and UTI symptoms with concern for urosepsis. PMHx significant for HTN, HFpEF (Echo 08/2021 LVEF 55-60%) , CKD stage III in the setting of bilateral hydronephrosis, urethral erosion (requiring cystoscopy) and urinary retention in the setting of metastatic prostate CA. Recent admission 08/2021 for AKI on CKD.   Presented for pre-op urology visit 12/21 and was noted to be febrile and tachycardic. UA at [facility] ED +pyuria, c/f urosepsis. Patient unfortunately had persistent hypotension despite fluid resuscitation and Levophed was initiated. Empiric Vanc/Cefepime/Flagyl initiated and Cx sent. He was admitted to the ICU. Pressor requirement decreased and patient was transferred to the hospitalist service 12/25. Patient eventually underwent cystoscopy with bilateral ureteral stent exchange and suprapubic catheter placement 12/29. Post-procedure, patient failed calorie count on dysphagia diet and decision was made to consult CCS 1/3 for PEG (IR unable to place 2/2 anatomical challenges). PICC was placed 1/7 for parenteral nutrition and TPN was initiated with eventual PEG placed 1/10.  On 1/10PM, patient returned to the floor post- PEG placement and after a few hours developed hypotension and bradycardia to 30s. He became unresponsive and CODE BLUE was called prompting initiation of LR bolus and atropine. BP improved but patient remained unresponsive; patient did not lose a pulse or require chest compressions but did require BVM. Anesthesia intubated patient in the setting of persistent hypoxia.  PCCM reconsulted for assistance with management.  Pertinent Medical History:   Past Medical History:  Diagnosis Date   Adult  failure to thrive    Anemia    Anxiety    Cancer ([place])    metastatic prostate cancer   CHF (congestive heart failure) (HCC)    Chronic kidney disease    stage 3   Dementia (HCC)    Depression    Dysphagia    Foley catheter in place    GERD (gastroesophageal reflux disease)    Hematuria    History of blood transfusion 08/20/2021   Hypernatremia    Hyponatremia    hx of   Malnutrition (HCC)    Obstructive and reflux uropathy    Physical debility    Severe sepsis with septic shock (CODE) (HCC)    hx of   SVT (supraventricular tachycardia) (HCC)    hx of   Urethral obstruction    bilateral   Urinary tract infection    hx of   Significant Hospital Events: Including procedures, antibiotic start and stop dates in addition to other pertinent events     Interim History / Subjective:  Patient had PEG tube placed today. While awake and watching TV he became hypotensive and bradycardic to the 30s with unresponsiveness. Rapid response called. Patient required manual bagging and then intubated by CRNA. Given LR bolus and atropine 1 mg. Did not lose pulse and no chest compressions.   Primary team has updated family on transfer to ICU and intubated status  Objective:  Blood pressure (!) 83/45, pulse (!) 107, temperature (!) 97.5 F (36.4 C), temperature source Axillary, resp. rate 19, height 5\' 9"  (1.753 m), weight 84 kg, SpO2 100 %.    Vent Mode: PRVC FiO2 (%):  [100 %] 100 % Set Rate:  [18 bmp] 18 bmp Vt Set:  [560 mL] 560 mL  PEEP:  [5 cmH20] 5 cmH20 Plateau Pressure:  [15 cmH20] 15 cmH20   Intake/Output Summary (Last 24 hours) at 11/23/2021 1943 Last data filed at 11/23/2021 1800 Gross per 24 hour  Intake 2203.57 ml  Output 2605 ml  Net -401.43 ml   Filed Weights   11/16/21 0334 11/19/21 1535 11/22/21 0500  Weight: 75.9 kg 84 kg 84 kg   Physical Exam: General: Critically and chronically ill-appearing, obtunded HENT: [place], AT, ETT in place Eyes: Right pupil ovoid and  sluggish to light, left pupil 56mm and reactive, no scleral icterus Respiratory: Anterior rhonchi R>L.  No wheezing  Cardiovascular: Tachycardic, -M/R/G, no JVD GI: BS+, soft, nontender, PEG in place Extremities:-Edema,-tenderness Neuro: Eyes closed, withdraws to noxious stimuli, moves extremities spontaneously x 4 GU: Suprapubic catheter  CXR 1/10 - increased patchy infiltrates R >L. Metastatic bone lesions  Resolved Hospital Problem List:    Assessment & Plan:  88 year old male metastatic prostate cancer, chronic indwelling foley, CKD IIIa, chronic diastolic heart failure, hx SVT initially admitted to ICU for urosepsis. Transferred out of ICU 12/24. Returned to ICU 1/10 for unresponsiveness, bradycardia and hypotension after G-tube placement earlier in the day.  Acute encephalopathy secondary to below Minimize sedating meds RASS goal -1 PAD protocol: PRN fentanyl. Start Precedex if needed  Acute hypoxemic respiratory failure secondary suspected aspiration pneumonitis  Full vent support F/u ABG F/u CXR SBT/WUA in the am VAP  Transient bradycardia and hypotension - resolved. Suspect secondary to hypoxemia in setting of aspiration S/p atropine 1 mg Off pressors Telemetry Hold BB  Aspiration pneumonitis vs pneumonia Dysphagia s/p PEG tube Low threshold to start antibiotic if febrile/worsening clinical status Hold antibiotics for now Hold TF tonight  AKI IVF Monitor UOP/Cr Avoid nephrotoxic agents  Metastatic prostate cancer Chronic urinary retention S/p cystoscopy bilateral stent exchange 12/21 S/p suprapubic catheter placement 12/29 Urology following  Best Practice: (right click and "Reselect all SmartList Selections" daily)   Diet/type: NPO DVT prophylaxis: SCD Restart in am GI prophylaxis: PPI Lines: N/A Foley:  Yes, and it is still needed Code Status:  full code Last date of multidisciplinary goals of care discussion [11/23/21] Per primary team. Will need to  reassess mental status in am and discuss risk of re-intubation  Labs:  CBC: Recent Labs  Lab 11/17/21 1009 11/18/21 0511 11/19/21 0438 11/20/21 0521 11/23/21 0312  WBC 7.9 6.9 6.3 5.4 5.4  NEUTROABS 5.2 4.3 3.6 3.0  --   HGB 7.8* 6.5* 8.3* 8.1* 7.9*  HCT 25.8* 20.7* 27.3* 26.8* 24.6*  MCV 104.5* 103.5* 103.8* 102.7* 99.6  PLT 212 205 188 208 485   Basic Metabolic Panel: Recent Labs  Lab 11/19/21 0438 11/19/21 1630 11/20/21 0521 11/20/21 1702 11/21/21 0324 11/21/21 0825 11/22/21 0348 11/23/21 0312  NA 141  --  142  --   --  139 140 138  K 4.2  --  4.2  --   --  4.5 4.6 4.3  CL 120*  --  121*  --   --  120* 116* 115*  CO2 15*  --  17*  --   --  16* 19* 20*  GLUCOSE 98  --  119*  --   --  95 112* 115*  BUN 12  --  11  --   --  11 23 35*  CREATININE 1.44*  --  1.36*  --   --  1.36* 1.22 1.29*  CALCIUM 7.6*  --  7.8*  --   --  7.5*  7.4* 7.4*  MG 1.6*   < > 1.8 1.9 2.1 1.8 2.0 2.0  PHOS 2.8   < > 2.6 2.9 4.9* 3.1 3.2 3.4   < > = values in this interval not displayed.   GFR: Estimated Creatinine Clearance: 41.1 mL/min (A) (by C-G formula based on SCr of 1.29 mg/dL (H)). Recent Labs  Lab 11/17/21 1145 11/18/21 0511 11/19/21 0438 11/20/21 0521 11/23/21 0312  WBC  --  6.9 6.3 5.4 5.4  LATICACIDVEN 1.0 0.9  --   --   --    Liver Function Tests: Recent Labs  Lab 11/18/21 0511 11/19/21 0438 11/20/21 0521 11/22/21 0348 11/23/21 0312  AST 17 26 18 16 18   ALT 11 11 13 12 13   ALKPHOS 302* 289* 246* 312* 333*  BILITOT 0.4 0.7 0.3 0.3 0.3  PROT 5.1* 5.1* 5.1* 5.3* 5.7*  ALBUMIN 1.7* 1.6* 1.6* 1.8* 1.9*   No results for input(s): LIPASE, AMYLASE in the last 168 hours. Recent Labs  Lab 11/17/21 1358  AMMONIA 19   ABG:    Component Value Date/Time   HCO3 20.4 11/08/2021 1912   ACIDBASEDEF 2.9 (H) 11/08/2021 1912   O2SAT 63.8 11/08/2021 1912    Coagulation Profile: No results for input(s): INR, PROTIME in the last 168 hours.  Cardiac Enzymes: No results  for input(s): CKTOTAL, CKMB, CKMBINDEX, TROPONINI in the last 168 hours.  HbA1C: Hgb A1c MFr Bld  Date/Time Value Ref Range Status  09/07/2021 04:06 AM 6.1 (H) 4.8 - 5.6 % Final    Comment:    (NOTE) Pre diabetes:          5.7%-6.4%  Diabetes:              >6.4%  Glycemic control for   <7.0% adults with diabetes    CBG: Recent Labs  Lab 11/23/21 0555 11/23/21 1203 11/23/21 1434 11/23/21 1722 11/23/21 1906  GLUCAP 121* 123* 95 153* 256*   Review of Systems:   Patient is encephalopathic and/or intubated. Therefore history has been obtained from chart review.   Past Medical History:  He,  has a past medical history of Adult failure to thrive, Anemia, Anxiety, Cancer ([place]), CHF (congestive heart failure) ([place]), Chronic kidney disease, Dementia ([place]), Depression, Dysphagia, Foley catheter in place, GERD (gastroesophageal reflux disease), Hematuria, History of blood transfusion (08/20/2021), Hypernatremia, Hyponatremia, Malnutrition ([place]), Obstructive and reflux uropathy, Physical debility, Severe sepsis with septic shock (CODE) ([place]), SVT (supraventricular tachycardia) ([place]), Urethral obstruction, and Urinary tract infection.   Surgical History:   Past Surgical History:  Procedure Laterality Date   CYSTOSCOPY W/ URETERAL STENT PLACEMENT Bilateral 11/11/2021   Procedure: CYSTOSCOPY WITH STENT  CHANGE/ SUPRO PUBIC TUBE PLACEMENT;  Surgeon: Raynelle Bring, MD;  Location: [facility];  Service: Urology;  Laterality: Bilateral;   CYSTOSCOPY WITH RETROGRADE PYELOGRAM, URETEROSCOPY AND STENT PLACEMENT Bilateral 08/18/2021   Procedure: 1.  Cystoscopy 2.  Fulguration of bladder;  Surgeon: Raynelle Bring, MD;  Location: [place];  Service: Urology;  Laterality: Bilateral;   INSERTION OF SUPRAPUBIC CATHETER N/A 11/11/2021   Procedure: INSERTION OF SUPRAPUBIC CATHETER;  Surgeon: Raynelle Bring, MD;  Location: [facility];  Service: Urology;  Laterality: N/A;   IR NEPHRO TUBE REMOV/FL  09/06/2021   IR  NEPHRO TUBE REMOV/FL  09/06/2021   IR NEPHROSTOMY PLACEMENT LEFT  08/19/2021   IR NEPHROSTOMY PLACEMENT RIGHT  08/19/2021   IR URETERAL STENT PLACEMENT EXISTING ACCESS LEFT  08/31/2021   IR URETERAL STENT PLACEMENT EXISTING ACCESS RIGHT  08/31/2021  TONSILECTOMY, ADENOIDECTOMY, BILATERAL MYRINGOTOMY AND TUBES      Social History:   reports that he has never smoked. He has never used smokeless tobacco. He reports that he does not drink alcohol and does not use drugs.   Family History:  His family history is not on file.   Allergies: Allergies  Allergen Reactions   Solanum Lycopersicum [Tomato]     Patient reports he is not allergic to tomato and he eats them all the time    Home Medications: Prior to Admission medications   Medication Sig Start Date End Date Taking? Authorizing Provider  abiraterone acetate (ZYTIGA) 250 MG tablet Take 1,000 mg by mouth daily. 10/28/21  Yes [provider]  Cholecalciferol (VITAMIN D-3) 125 MCG (5000 UT) TABS Take 5,000 Units by mouth daily.   Yes [provider]  famotidine (PEPCID) 20 MG tablet Take 20 mg by mouth daily. 10/12/21  Yes [provider]  folic acid (FOLVITE) 1 MG tablet Place 1 tablet (1 mg total) into feeding tube daily. 09/07/21  Yes Samella Parr, NP  magnesium oxide (MAG-OX) 400 (240 Mg) MG tablet Take 1 tablet (400 mg total) by mouth 2 (two) times daily. 09/06/21  Yes Samella Parr, NP  mirtazapine (REMERON) 7.5 MG tablet Take 7.5 mg by mouth at bedtime. 10/06/21  Yes [provider]  Multiple Vitamin (MULTIVITAMIN WITH MINERALS) TABS tablet Place 1 tablet into feeding tube daily. 09/07/21  Yes Samella Parr, NP  Nutritional Supplements (FEEDING SUPPLEMENT, PROSOURCE TF,) liquid Place 45 mLs into feeding tube daily. 09/07/21  Yes Samella Parr, NP  oxybutynin (DITROPAN) 5 MG tablet Place 0.5 tablets (2.5 mg total) into feeding tube 2 (two) times daily. 09/06/21  Yes Samella Parr, NP   Pollen Extracts (PROSTAT PO) Take 1 tablet by mouth in the morning and at bedtime.   Yes [provider]  vitamin B-12 (CYANOCOBALAMIN) 1000 MCG tablet Take 2,000 mcg by mouth daily.   Yes [provider]  acetaminophen (TYLENOL) 325 MG tablet Take 2 tablets (650 mg total) by mouth every 6 (six) hours as needed for mild pain or fever. Patient not taking: Reported on 11/03/2021 09/06/21   Samella Parr, NP  food thickener (SIMPLYTHICK, HONEY/LEVEL 3/MODERATELY THICK,) LIQD Take 1 packet by mouth as needed. 09/06/21   Samella Parr, NP  LORazepam (ATIVAN) 2 MG/ML injection Inject 0.25 mLs (0.5 mg total) into the vein every 6 (six) hours as needed for sedation or anxiety. Patient not taking: Reported on 11/03/2021 09/06/21   Samella Parr, NP  metoprolol tartrate (LOPRESSOR) 5 MG/5ML SOLN injection Inject 5 mLs (5 mg total) into the vein every 5 (five) minutes as needed (sustained HR >254, hold for systolic BP less than 270). Patient not taking: Reported on 11/03/2021 09/06/21   Samella Parr, NP  Nutritional Supplements (FEEDING SUPPLEMENT, OSMOLITE 1.2 CAL,) LIQD Place 1,000 mLs into feeding tube every 12 (twelve) hours. Patient not taking: Reported on 11/03/2021 09/06/21   Samella Parr, NP  Water For Irrigation, Sterile (FREE WATER) SOLN Place 125 mLs into feeding tube every 4 (four) hours. 09/06/21   Samella Parr, NP     Critical care time: 45 min   The patient is critically ill with multiple organ systems failure and requires high complexity decision making for assessment and support, frequent evaluation and titration of therapies, application of advanced monitoring technologies and extensive interpretation of multiple databases.   Rodman Pickle, M.D. [facility] Pulmonary/Critical  Care Medicine 11/23/2021 8:29 PM   Please see Amion for pager number to reach on-call Pulmonary and Critical Care Team.

## 2021-11-23 NOTE — Care Management Important Message (Signed)
Important Message  Patient Details IM Letter placed in Patients room. Name: Anthony Dudley MRN: [id number] Date of Birth: 11-23-1932   Medicare Important Message Given:  Yes     Kerin Salen 11/23/2021, 11:18 AM

## 2021-11-23 NOTE — Anesthesia Procedure Notes (Signed)
Procedure Name: Intubation Date/Time: 11/23/2021 7:15 PM Performed by: Montel Clock, CRNA Pre-anesthesia Checklist: Patient identified, Emergency Drugs available, Suction available and Patient being monitored Patient Re-evaluated:Patient Re-evaluated prior to induction Oxygen Delivery Method: Ambu bag Preoxygenation: Pre-oxygenation with 100% oxygen Induction Type: IV induction Ventilation: Mask ventilation without difficulty Laryngoscope Size: Mac and 3 Grade View: Grade I Tube type: Oral Tube size: 7.5 mm Number of attempts: 1 Airway Equipment and Method: Stylet Placement Confirmation: ETT inserted through vocal cords under direct vision, breath sounds checked- equal and bilateral and CO2 detector Secured at: 23 (gums) cm Tube secured with: Tape Dental Injury: Teeth and Oropharynx as per pre-operative assessment

## 2021-11-23 NOTE — Progress Notes (Addendum)
° ° °  OVERNIGHT PROGRESS REPORT  Code Called - Event CARDIOPULMONARY RESUSCITATION Initial rhythm: Brady CPR performance duration: None required Was patient intubated ? Yes   Medications Administered Include:      Yes/no Amiodarone   Atropine      Yes   Calcium   Epinephrine   Lidocaine   Magnesium   Norepinephrine   Phenylephrine   Sodium bicarbonate   Vasopression    Evaluation Final Status -  Transferred to ICU -Intubated If successfully resuscitated - what is current rhythm ? Sinus to Sinus tach If successfully resuscitated - what is current hemodynamic status ? +/- Levophed  Goals of Care:  Family and staff present: Staff notified Family Summary of discussion: Family is aware of event  The Clinical status was relayed to family in detail including cardiac arrest and subsequent interventions. Updated and notified of patients medical condition.  Patient remains unresponsive and will not open eyes to command.   Upon assessment his breathing is vent assisted and a chest x-ray has been completed.  Explained to family course of therapy and the modalities    Family understands the situation. Patient is a FULL CODE.   Assessment & Plan:  Cardiac Arrest Circulatory shock PMHx:  88 year old man initially presented to [facility] ER on 11/03/2022 Originally admitted for fever and concern for urosepsis. Past medical history significant for HTN, HFpEF, CKD stage III with bilateral hydronephrosis, urethral erosion, urinary retention, metastatic prostate CA. 11/23/2021 patient returned to the floor after PEG tube placement earlier today.  Was awake sitting up watching TV on arrival to the floor.  He developed hypotension and bradycardia with heart rate in the 30s and was given atropine. He became unresponsive at that time rapid response was called, CODE BLUE was called. He did not lose pulse and did not require chest compressions. He did require manual ventilation with BVM. He was  hypoxic and intubated at that time. He is transferred to the ICU on the ventilator. Chest x-ray, abdominal x-ray, lab work has been completed.  He was currently followed by CCM, and they are aware and are onsite.  Patient is now ICU status. - Continue Vasopressors PRN: levophed PRN to maintain MAP > 70  - continuous cardiac monitoring - STAT labs: ABG, CBC, BMP, Mg, Phos, Lactic, drawn  - in process. - EKG completed. - I/O's: alert provider if UOP < 0.5 mL/kg/hr   -Head of bed elevated 30 degrees, VAP protocol in place    SCDs are in place.  - Ensure adequate pulmonary hygiene    Gershon Cull BSN MSNA MSN ACNPC-AG Acute Care Nurse Practitioner [place]

## 2021-11-23 NOTE — Significant Event (Signed)
Responded to CODE BLUE while seeing patient nearby.  Per rapid response team, patient returned to floor after PEG tube placed earlier today.  He was awake, sitting up, watching TV on arrival to the floor.  He subsequently developed hypotension and bradycardia with heart rate in the 30s.  He became unresponsive.  Rapid response team were called.  He was given lactated Ringer's and atropine once with improvement in heart rate and blood pressure however remained unresponsive.  CODE BLUE subsequently called.  He did not lose pulse and did not require chest compressions but required manual bagging ventilation.  He remained hypoxic and was subsequently intubated by CRNA.  He will be transferred to the ICU.  Postintubation chest x-ray pending.

## 2021-11-23 NOTE — Progress Notes (Signed)
PHARMACY - TOTAL PARENTERAL NUTRITION CONSULT NOTE   Indication:  intolerance to oral feeds , failure to thrive  Patient Measurements: Height: '5\' 9"'  (175.3 cm) Weight: 84 kg (185 lb 3 oz) IBW/kg (Calculated) : 70.7 TPN AdjBW (KG): 76.7 Body mass index is 27.35 kg/m.  Assessment: 88 yo male with dysphasia, poor oral intake, and failure to thrive in the setting of metastatic prostate cancer.  Was previously on dysphasia 1 diet, however failed calorie count.  General surgery was consulted to see patient for placement of G tube, however was unable to be placed due to patient anatomy.  With worsening mental status and vital signs, unable to place NG tube to begin enteral feeds.  Pharmacy has been consulted to dose TPN.    Glucose / Insulin: no hx DM  - on sSSI q6h (used 3 units during the past 24 hrs) - cbgs: 115-146 Electrolytes:  CL high at 115. CO2 low but trending up to 20 -other lytes wnl including CorrCa Renal: Scr up 1.29, BUN up 35 Hepatic: AST/ALT and Tbili wnl - Alk phos elevated and trending up with 333 Intake / Output: - 107 mL - UOP: 2650 ml MIVF: IVF dc'd by TRH on 1/7  GI Imaging: none thus far GI Surgeries / Procedures:  -1/6: Attempted NG tube placement, however could not be advanced due to agitation  Central access: PICC placed 1/7 for TPN TPN start date:  1/7  Nutritional Goals: Goal TPN rate is 90 mL/hr (provides 112 g of protein and ~2100 kcals per day)  RD Assessment: 11/19/21 Estimated Needs Total Energy Estimated Needs: 2100-2300 kcal Total Protein Estimated Needs: 105-120 grams Total Fluid Estimated Needs: >/= 2.3 L/day  Current Nutrition:  - Dysphagia 1 - 15% & 25% of 2 meals documented intake - Ensure Enlive/Plus 2 times daily (missed some doses d/t agitation)  Plan:   At 1800: - continue TPN at goal rate of 90 mL/hr at 1800 - Electrolytes in TPN:  Na 19mq/L K 430m/L Ca 52m6mL Mg 52mE87m Phos 152mm26m Cl:Ac Max acetate - Add standard MVI  and trace elements to TPN -  continue Sensitive q6h SSI and adjust as needed  - MIVF DC'd by TRH on 1/7 - Monitor TPN labs on Mon/Thurs - cmet, phos and mag on 1/11 - per CCS, plan for G-tube placement on 1/10  Samule Life PDia SitterrmD, BCPS 11/23/2021 7:15 AM

## 2021-11-23 NOTE — Progress Notes (Signed)
Pre Procedure note for inpatients:   Anthony Dudley has been scheduled for Procedure(s): PEG VS LAPAROSCOPIC ASSISTED GASTROSTOMY PLACEMENT (N/A) today. The various methods of treatment have been discussed with the patient. After consideration of the risks, benefits and treatment options the patient has consented to the planned procedure.   The patient has been seen and labs reviewed. There are no changes in the patients condition to prevent proceeding with the planned procedure today.  Recent labs:  Lab Results  Component Value Date   WBC 5.4 11/23/2021   HGB 7.9 (L) 11/23/2021   HCT 24.6 (L) 11/23/2021   PLT 198 11/23/2021   GLUCOSE 115 (H) 11/23/2021   TRIG 108 11/22/2021   ALT 13 11/23/2021   AST 18 11/23/2021   NA 138 11/23/2021   K 4.3 11/23/2021   CL 115 (H) 11/23/2021   CREATININE 1.29 (H) 11/23/2021   BUN 35 (H) 11/23/2021   CO2 20 (L) 11/23/2021   TSH 21.227 (H) 11/17/2021   INR 1.7 (H) 11/03/2021   HGBA1C 6.1 (H) 09/07/2021    Mickeal Skinner, MD 11/23/2021 3:33 PM

## 2021-11-23 NOTE — Progress Notes (Signed)
eLink Physician-Brief Progress Note Patient Name: Anthony Dudley DOB: [birth year] MRN: [id number]   Date of Service  11/23/2021  HPI/Events of Note  10 M history of HFpEF, CKD, metastatic prostate CA with bilateral hydropnephrosis s/pureteral stents admitted since 12/21 for UTI septic shock underwent bilateral ureteral stent exchange and suprapubic catheter insertion. PEG placement done earlier today.  RRT for bradycardia, hypotension and unresponsiveness. Given atropine and fluids and eventually intubated for airway protection and hypoxia.  Last Fentanyl dose 3:30 (likely procedural) Lopressor was placed on hold pre op  ABG 7.27/46/314 on PRVC 18/560/100%/5 PEEP  eICU Interventions  CCM team at bedside Patient to be started on norepinephrine Labs pending     Intervention Category Evaluation Type: New Patient Evaluation  Judd Lien 11/23/2021, 7:49 PM

## 2021-11-23 NOTE — Progress Notes (Signed)
Pharmacy Antibiotic Note  Anthony Dudley is a 88 y.o. male admitted on 11/03/2021 with concerns for urosepsis and fever.  Tonight pt developed hypotension and bradycardia, Code blue was called.  Pharmacy has been consulted to dose unasyn for aspiration pna.  Plan: Unasyn 3gm IV q8h Follow renal function and clinical course  Height: 5\' 9"  (175.3 cm) Weight: 84 kg (185 lb 3 oz) IBW/kg (Calculated) : 70.7  Temp (24hrs), Avg:97.6 F (36.4 C), Min:94.6 F (34.8 C), Max:99.8 F (37.7 C)  Recent Labs  Lab 11/17/21 1145 11/18/21 0511 11/19/21 0438 11/20/21 0521 11/21/21 0825 11/22/21 0348 11/23/21 0312 11/23/21 1940 11/23/21 1941  WBC  --  6.9 6.3 5.4  --   --  5.4 9.4  --   CREATININE  --  1.40* 1.44* 1.36* 1.36* 1.22 1.29* 1.38*  --   LATICACIDVEN 1.0 0.9  --   --   --   --   --   --  2.7*   1.2    Estimated Creatinine Clearance: 38.4 mL/min (A) (by C-G formula based on SCr of 1.38 mg/dL (H)).    Allergies  Allergen Reactions   Solanum Lycopersicum [Tomato]     Patient reports he is not allergic to tomato and he eats them all the time    Thank you for allowing pharmacy to be a part of this patients care.  Dolly Rias RPh 11/23/2021, 11:28 PM

## 2021-11-23 NOTE — Progress Notes (Signed)
Spoke with Damita Dunnings and provided update regarding change of status and transfer to ICU.

## 2021-11-24 ENCOUNTER — Encounter (HOSPITAL_COMMUNITY): Payer: Self-pay | Admitting: General Surgery

## 2021-11-24 ENCOUNTER — Inpatient Hospital Stay (HOSPITAL_COMMUNITY): Payer: Medicare Other

## 2021-11-24 DIAGNOSIS — J9601 Acute respiratory failure with hypoxia: Secondary | ICD-10-CM | POA: Diagnosis not present

## 2021-11-24 LAB — CBC
HCT: 25.9 % — ABNORMAL LOW (ref 39.0–52.0)
Hemoglobin: 8 g/dL — ABNORMAL LOW (ref 13.0–17.0)
MCH: 31.3 pg (ref 26.0–34.0)
MCHC: 30.9 g/dL (ref 30.0–36.0)
MCV: 101.2 fL — ABNORMAL HIGH (ref 80.0–100.0)
Platelets: 223 10*3/uL (ref 150–400)
RBC: 2.56 MIL/uL — ABNORMAL LOW (ref 4.22–5.81)
RDW: 18.7 % — ABNORMAL HIGH (ref 11.5–15.5)
WBC: 7.4 10*3/uL (ref 4.0–10.5)
nRBC: 0 % (ref 0.0–0.2)

## 2021-11-24 LAB — BASIC METABOLIC PANEL
Anion gap: 9 (ref 5–15)
BUN: 43 mg/dL — ABNORMAL HIGH (ref 8–23)
CO2: 21 mmol/L — ABNORMAL LOW (ref 22–32)
Calcium: 7.7 mg/dL — ABNORMAL LOW (ref 8.9–10.3)
Chloride: 106 mmol/L (ref 98–111)
Creatinine, Ser: 1.28 mg/dL — ABNORMAL HIGH (ref 0.61–1.24)
GFR, Estimated: 55 mL/min — ABNORMAL LOW (ref >60)
Glucose, Bld: 197 mg/dL — ABNORMAL HIGH (ref 70–99)
Potassium: 5.1 mmol/L (ref 3.5–5.1)
Sodium: 136 mmol/L (ref 135–145)

## 2021-11-24 LAB — URINALYSIS, ROUTINE W REFLEX MICROSCOPIC
Bacteria, UA: NONE SEEN
Bilirubin Urine: NEGATIVE
Glucose, UA: >=500 mg/dL — AB
Ketones, ur: NEGATIVE mg/dL
Nitrite: NEGATIVE
Protein, ur: NEGATIVE mg/dL
Specific Gravity, Urine: 1.008 (ref 1.005–1.030)
WBC, UA: >50 WBC/hpf — ABNORMAL HIGH (ref 0–5)
pH: 7 (ref 5.0–8.0)

## 2021-11-24 LAB — GLUCOSE, CAPILLARY
Glucose-Capillary: 206 mg/dL — ABNORMAL HIGH (ref 70–99)
Glucose-Capillary: 174 mg/dL — ABNORMAL HIGH (ref 70–99)
Glucose-Capillary: 144 mg/dL — ABNORMAL HIGH (ref 70–99)
Glucose-Capillary: 153 mg/dL — ABNORMAL HIGH (ref 70–99)
Glucose-Capillary: 119 mg/dL — ABNORMAL HIGH (ref 70–99)
Glucose-Capillary: 151 mg/dL — ABNORMAL HIGH (ref 70–99)
Glucose-Capillary: 150 mg/dL — ABNORMAL HIGH (ref 70–99)

## 2021-11-24 LAB — BLOOD GAS, ARTERIAL
Acid-base deficit: 1.4 mmol/L (ref 0.0–2.0)
Bicarbonate: 21.7 mmol/L (ref 20.0–28.0)
FIO2: 30
MECHVT: 560 mL
O2 Saturation: 99.2 %
PEEP: 5 cmH2O
Patient temperature: 98.6
RATE: 18 resp/min
pCO2 arterial: 31.3 mmHg — ABNORMAL LOW (ref 32.0–48.0)
pH, Arterial: 7.455 — ABNORMAL HIGH (ref 7.350–7.450)
pO2, Arterial: 115 mmHg — ABNORMAL HIGH (ref 83.0–108.0)

## 2021-11-24 LAB — TROPONIN I (HIGH SENSITIVITY)
Troponin I (High Sensitivity): 10 ng/L (ref <18)
Troponin I (High Sensitivity): 11 ng/L (ref <18)

## 2021-11-24 LAB — PHOSPHORUS: Phosphorus: 3.4 mg/dL (ref 2.5–4.6)

## 2021-11-24 LAB — MAGNESIUM: Magnesium: 1.9 mg/dL (ref 1.7–2.4)

## 2021-11-24 MED ORDER — ORAL CARE MOUTH RINSE
15.0000 mL | OROMUCOSAL | Status: DC
  Administered 2021-11-24: 15 mL via OROMUCOSAL

## 2021-11-24 MED ORDER — HEPARIN SODIUM (PORCINE) 5000 UNIT/ML IJ SOLN
5000.0000 [IU] | Freq: Three times a day (TID) | INTRAMUSCULAR | Status: DC
  Administered 2021-11-24 – 2021-11-27 (×9): 5000 [IU] via SUBCUTANEOUS
  Filled 2021-11-24 (×9): qty 1

## 2021-11-24 MED ORDER — ORAL CARE MOUTH RINSE
15.0000 mL | Freq: Two times a day (BID) | OROMUCOSAL | Status: DC
  Administered 2021-11-24 – 2021-12-06 (×25): 15 mL via OROMUCOSAL

## 2021-11-24 MED ORDER — CHLORHEXIDINE GLUCONATE 0.12 % MT SOLN: 15.0000 mL | Freq: Two times a day (BID) | OROMUCOSAL | Status: DC

## 2021-11-24 MED ORDER — TRAVASOL 10 % IV SOLN
INTRAVENOUS | Status: AC
  Filled 2021-11-24: qty 1123.2

## 2021-11-24 NOTE — Progress Notes (Signed)
Physical Therapy Discharge Patient Details Name: Anthony Dudley MRN: [id number] DOB: [birth year] Today's Date: 11/24/2021 Time:  -     Patient discharged from PT services secondary to medical decline - will need to re-order PT to resume therapy services,pt. coded, on vent.      GP     Claretha Cooper 11/24/2021, 7:05 AM  [place] Pager [phone] Office [phone]

## 2021-11-24 NOTE — Progress Notes (Signed)
Patient's heart rate went down to 42, while sleeping. After waking patient up, he came back up to 70. Dr. Halford Chessman and Sallye Ober notified on the floor. Orders placed to discontinue medications that could be contributing.

## 2021-11-24 NOTE — Procedures (Signed)
Extubation Procedure Note  Patient Details:   Name: Aquiles Ruffini DOB: [birth year] MRN: [id number]   Airway Documentation:    Vent end date: 11/24/21 Vent end time: 1013   Evaluation  O2 sats: stable throughout Complications: No apparent complications Patient did tolerate procedure well. Bilateral Breath Sounds: Rhonchi   Placed pt on 2L nasal cannula. Pt able to speak. Tamera Reason 11/24/2021, 10:17 AM

## 2021-11-24 NOTE — Progress Notes (Signed)
OT Cancellation Note  Patient Details Name: Anthony Dudley MRN: [id number] DOB: 1933/04/07   Cancelled Treatment:    Reason Eval/Treat Not Completed: Patient not medically ready. Patient currently intubated and on levophed following Code Blue yesterday. OT will sign of. Reorder when patient medically stable for therapy.   Sharlon Pfohl L Eloina Ergle 11/24/2021, 7:04 AM

## 2021-11-24 NOTE — Progress Notes (Signed)
NAMEHani Dudley, MRN:  [id number], DOB:  04/01/1933, LOS: 60 ADMISSION DATE:  11/03/2021 CONSULTATION DATE:  11/23/2021 REFERRING MD:  Verlon Au - TRH CHIEF COMPLAINT:  Bradycardia, ?code on floor   History of Present Illness:  88 year old man who initially presented to [facility] 12/21 for fever and UTI symptoms with concern for urosepsis. PMHx significant for HTN, HFpEF (Echo 08/2021 LVEF 55-60%), CKD stage III in the setting of bilateral hydronephrosis, urethral erosion (requiring cystoscopy) and urinary retention in the setting of metastatic prostate CA. Recent admission 08/2021 for AKI on CKD.   Presented for pre-op urology visit 12/21 and was noted to be febrile and tachycardic. UA at [facility] ED +pyuria, concerning for urosepsis. Patient unfortunately had persistent hypotension despite fluid resuscitation and Levophed was initiated. Empiric Vanc/Cefepime/Flagyl initiated and Cx sent. He was admitted to the ICU. Pressor requirement decreased and patient was transferred to the hospitalist service 12/25. Patient eventually underwent cystoscopy with bilateral ureteral stent exchange and suprapubic catheter placement 12/29. Post-procedure, patient failed calorie count on dysphagia diet and decision was made to consult CCS 1/3 for PEG (IR unable to place 2/2 anatomical challenges). PICC was placed 1/7 for parenteral nutrition and TPN was initiated with eventual PEG placed 1/10.  On 1/10PM, patient returned to the floor post- PEG placement and after a few hours developed hypotension and bradycardia to 30s. He became unresponsive and CODE BLUE was called prompting initiation of LR bolus and atropine. BP improved but patient remained unresponsive; patient did not lose a pulse or require chest compressions but did require BVM. Anesthesia intubated patient in the setting of persistent hypoxia.  PCCM reconsulted for assistance with management.  Pertinent Medical History:  Adult Failure to Thrive  Malnutrition   Dysphagia  GERD Anemia  Anxiety / Depression  CKD III  Dementia  Metastatic Prostate Cancer  Bilateral Obstructive & Reflux Uropathy  SVT  CHF  Hx Sepsis with Septic Shock  Significant Hospital Events: Including procedures, antibiotic start and stop dates in addition to other pertinent events   12/21 Admit with urosepsis, septic shock  12/25 to [facility]  12/29 Cystoscopy with bilateral ureteral stent exchange and suprapubic catheter placement  1/3 CCS consulted for PEG due to persistent dysphagia and poor intake  1/7 PICC placed for TPN  1/10 PEG placement per CCS.  Post placement on floor, pt developed hypotension and bradycardia to 30's requiring BVM.  Intubated by anesthesia.  LR bolus + atropine. Did not require CPR.   Interim History / Subjective:  Afebrile  Pt on PSV 5/5  HR low in to 40's per RN overnight, note patient on lopressor PT  Glucose range 144-206 I/O 2.6L UOP, +848ml in last 24 hours   Objective:  Blood pressure (!) 156/38, pulse 71, temperature 98.2 F (36.8 C), temperature source Oral, resp. rate (!) 21, height 5\' 9"  (1.753 m), weight 83 kg, SpO2 99 %.    Vent Mode: PSV FiO2 (%):  [30 %-100 %] 30 % Set Rate:  [18 bmp] 18 bmp Vt Set:  [560 mL] 560 mL PEEP:  [5 cmH20] 5 cmH20 Pressure Support:  [5 cmH20] 5 cmH20 Plateau Pressure:  [14 cmH20-15 cmH20] 14 cmH20   Intake/Output Summary (Last 24 hours) at 11/24/2021 0924 Last data filed at 11/24/2021 0500 Gross per 24 hour  Intake 3456.89 ml  Output 2180 ml  Net 1276.89 ml   Filed Weights   11/19/21 1535 11/22/21 0500 11/24/21 0500  Weight: 84 kg 84 kg 83 kg   Physical  Exam: General: frail elderly, chronically ill appearing male on vent in NAD  HEENT: MM pink/moist, ETT, anicteric, known abnormal shape R pupil Neuro: awake, alert, tracks provider, mouths words "good morning" in appropriate response to introduction, follows commands CV: s1s2 RRR, SR 60's on monitor, no m/r/g PULM: non-labored at rest,  vent assisted breath sounds GI: soft, bsx4 active, PEG in place c/d/i, suprapubic catheter in place Extremities: warm/dry, no edema, small bilateral great toe lesions  Skin: no rashes or lesions  Resolved Hospital Problem List:    Assessment & Plan:  88 year old male metastatic prostate cancer, chronic indwelling foley, CKD IIIa, chronic diastolic heart failure, hx SVT initially admitted to ICU for urosepsis. Transferred out of ICU 12/24. Returned to ICU 1/10 for unresponsiveness, bradycardia and hypotension after G-tube placement earlier in the day.  Acute Metabolic Encephalopathy  In setting of sedation, mild hypercarbia, possible aspiration event  -minimize all sedating medications  -discontinue sedation with extubation  -PT efforts   Acute hypoxemic respiratory failure secondary suspected aspiration pneumonitis  -PSV wean with goal for extubation  -pulmonary hygiene post extubation  -follow intermittent CXR with possible aspiration   Transient bradycardia and hypotension  Suspect secondary to hypoxemia in setting of aspiration, beta blocker effects. Required atropine x1 1/11.  -hold scheduled lopressor -tele monitoring  -wean levophed off for MAP >65   Aspiration pneumonitis vs pneumonia Dysphagia s/p PEG tube -continue unasyn for empiric aspiration coverage  -defer timing of TF to CCS when ok to use PEG   AKI Sr Cr stable  -Trend BMP / urinary output -Replace electrolytes as indicated -Avoid nephrotoxic agents, ensure adequate renal perfusion  Metastatic prostate cancer Chronic urinary retention S/p cystoscopy bilateral stent exchange 12/21 S/p suprapubic catheter placement 12/29 -appreciate Urology  -catheter care per protocol   Severe Protein Calorie Malnutrition (POA) Adult Failure to Thrive (POA) -TPN via PICC -begin TF when ok for PEG use per CCS   Best Practice: (right click and "Reselect all SmartList Selections" daily)  Diet/type: NPO and TPN DVT  prophylaxis: prophylactic heparin    GI prophylaxis: PPI Lines: Central line and yes and it is still needed Foley:  Yes, and it is still needed, supra pubic catheter  Code Status:  full code Last date of multidisciplinary goals of care discussion: 1/11, brother updated via phone by Dr. Halford Chessman on plan of care.     Critical care time: 58 minutes   Noe Gens, MSN, APRN, NP-C, AGACNP-BC [facility] 11/24/2021, 9:24 AM   Please see [email or web address] for pager details.

## 2021-11-24 NOTE — Progress Notes (Signed)
Progress Note  1 Day Post-Op  Subjective: On the vent. Developed hypotension and bradycardia a few hours after PEG placement last night, became unresponsive. Atropine and LR bolus given with improvement in BP. Patient remained unresponsive but did not lose pulses. Intubated for airway protection and persistent hypoxia. No reports of emesis. On levo this AM for BP support.   Objective: Vital signs in last 24 hours: Temp:  [94.6 F (34.8 C)-98.9 F (37.2 C)] 98.2 F (36.8 C) (01/11 0444) Pulse Rate:  [63-110] 71 (01/11 0500) Resp:  [16-24] 19 (01/11 0500) BP: (83-159)/(36-93) 131/38 (01/11 0500) SpO2:  [90 %-100 %] 99 % (01/11 0806) FiO2 (%):  [30 %-100 %] 30 % (01/11 0806) Weight:  [83 kg] 83 kg (01/11 0500) Last BM Date: 11/23/21  Intake/Output from previous day: 01/10 0701 - 01/11 0700 In: 3456.9 [I.V.:2462.6; IV Piggyback:994.3] Out: 2655 [Urine:2650; Blood:5] Intake/Output this shift: No intake/output data recorded.  PE: General: WD, elderly male who is laying in bed intubated Heart: regular, rate, and rhythm with HR in the 60s on my examination.   Lungs: some rales bilaterally, on the vent Abd: soft, NT, ND, +BS, PEG in place without bleeding and is clamped    Lab Results:  Recent Labs    11/23/21 1940 11/24/21 0400  WBC 9.4 7.4  HGB 7.9* 8.0*  HCT 26.2* 25.9*  PLT 217 223   BMET Recent Labs    11/23/21 1940 11/24/21 0400  NA 137 136  K 5.3* 5.1  CL 108 106  CO2 23 21*  GLUCOSE 313* 197*  BUN 39* 43*  CREATININE 1.38* 1.28*  CALCIUM 7.7* 7.7*   PT/INR No results for input(s): LABPROT, INR in the last 72 hours. CMP     Component Value Date/Time   NA 136 11/24/2021 0400   K 5.1 11/24/2021 0400   CL 106 11/24/2021 0400   CO2 21 (L) 11/24/2021 0400   GLUCOSE 197 (H) 11/24/2021 0400   BUN 43 (H) 11/24/2021 0400   CREATININE 1.28 (H) 11/24/2021 0400   CALCIUM 7.7 (L) 11/24/2021 0400   PROT 5.8 (L) 11/23/2021 1940   ALBUMIN 1.9 (L) 11/23/2021  1940   AST 20 11/23/2021 1940   ALT 14 11/23/2021 1940   ALKPHOS 344 (H) 11/23/2021 1940   BILITOT 0.3 11/23/2021 1940   GFRNONAA 55 (L) 11/24/2021 0400   Lipase  No results found for: LIPASE     Studies/Results: DG Abd 1 View  Result Date: 11/23/2021 CLINICAL DATA:  Status post code blue and intubation. EXAM: PORTABLE CHEST 1 VIEW COMPARISON:  Chest radiograph dated 11/17/2021. FINDINGS: Endotracheal tube with tip approximately 3.5 cm above the carina. Right-sided PICC with tip at the cavoatrial junction. Bilateral interstitial streaky densities and pulmonary opacities, similar or slightly worsened since the prior radiograph. No large pleural effusion. No pneumothorax. Stable cardiac silhouette. Atherosclerotic calcification of the aorta. Osteopenia with degenerative changes of the spine. Extensive sclerotic osseous metastatic disease noted. Percutaneous gastrostomy with balloon over the body of the stomach. There is moderate gaseous distension of the stomach. There is nonspecific bowel gas pattern. Bilateral ureteral stents noted. The soft tissues are grossly unremarkable. IMPRESSION: 1. Endotracheal tube with tip approximately 3.5 cm above the carina. Right-sided PICC with tip at the cavoatrial junction. 2. Bilateral interstitial streaky densities and pulmonary opacities/edema, similar or slightly worsened since the prior radiograph. 3. Extensive sclerotic osseous metastatic disease. Electronically Signed   By: Anner Crete M.D.   On: 11/23/2021 20:13   DG  CHEST PORT 1 VIEW  Result Date: 11/23/2021 CLINICAL DATA:  Status post code blue and intubation. EXAM: PORTABLE CHEST 1 VIEW COMPARISON:  Chest radiograph dated 11/17/2021. FINDINGS: Endotracheal tube with tip approximately 3.5 cm above the carina. Right-sided PICC with tip at the cavoatrial junction. Bilateral interstitial streaky densities and pulmonary opacities, similar or slightly worsened since the prior radiograph. No large  pleural effusion. No pneumothorax. Stable cardiac silhouette. Atherosclerotic calcification of the aorta. Osteopenia with degenerative changes of the spine. Extensive sclerotic osseous metastatic disease noted. Percutaneous gastrostomy with balloon over the body of the stomach. There is moderate gaseous distension of the stomach. There is nonspecific bowel gas pattern. Bilateral ureteral stents noted. The soft tissues are grossly unremarkable. IMPRESSION: 1. Endotracheal tube with tip approximately 3.5 cm above the carina. Right-sided PICC with tip at the cavoatrial junction. 2. Bilateral interstitial streaky densities and pulmonary opacities/edema, similar or slightly worsened since the prior radiograph. 3. Extensive sclerotic osseous metastatic disease. Electronically Signed   By: Anner Crete M.D.   On: 11/23/2021 20:13    Anti-infectives: Anti-infectives (From admission, onward)    Start     Dose/Rate Route Frequency Ordered Stop   11/24/21 0015  Ampicillin-Sulbactam (UNASYN) 3 g in sodium chloride 0.9 % 100 mL IVPB        3 g 200 mL/hr over 30 Minutes Intravenous Every 8 hours 11/23/21 2326     11/19/21 1400  vancomycin (VANCOREADY) IVPB 1000 mg/200 mL  Status:  Discontinued        1,000 mg 200 mL/hr over 60 Minutes Intravenous Daily 11/18/21 1417 11/19/21 1146   11/17/21 1830  piperacillin-tazobactam (ZOSYN) IVPB 3.375 g  Status:  Discontinued        3.375 g 12.5 mL/hr over 240 Minutes Intravenous Every 8 hours 11/17/21 1810 11/19/21 1628   11/17/21 1830  vancomycin (VANCOREADY) IVPB 1000 mg/200 mL  Status:  Discontinued        1,000 mg 200 mL/hr over 60 Minutes Intravenous Daily 11/17/21 1810 11/18/21 1417   11/17/21 0600  ceFAZolin (ANCEF) IVPB 2g/100 mL premix        2 g 200 mL/hr over 30 Minutes Intravenous On call to O.R. 11/16/21 1226 11/18/21 0559   11/11/21 0700  cefTRIAXone (ROCEPHIN) 2 g in sodium chloride 0.9 % 100 mL IVPB        2 g 200 mL/hr over 30 Minutes Intravenous  On call 11/11/21 0654 11/11/21 0957   11/10/21 2200  vancomycin (VANCOREADY) IVPB 1250 mg/250 mL  Status:  Discontinued        1,250 mg 166.7 mL/hr over 90 Minutes Intravenous Every 48 hours 11/08/21 1934 11/10/21 1147   11/09/21 1830  piperacillin-tazobactam (ZOSYN) IVPB 3.375 g  Status:  Discontinued        3.375 g 12.5 mL/hr over 240 Minutes Intravenous Every 8 hours 11/09/21 1730 11/15/21 1105   11/08/21 2015  vancomycin (VANCOREADY) IVPB 1500 mg/300 mL        1,500 mg 150 mL/hr over 120 Minutes Intravenous  Once 11/08/21 1927 11/09/21 0700   11/08/21 2000  metroNIDAZOLE (FLAGYL) IVPB 500 mg  Status:  Discontinued        500 mg 100 mL/hr over 60 Minutes Intravenous Every 12 hours 11/08/21 1908 11/09/21 1719   11/05/21 1700  ceFEPIme (MAXIPIME) 2 g in sodium chloride 0.9 % 100 mL IVPB  Status:  Discontinued        2 g 200 mL/hr over 30 Minutes Intravenous Every 24 hours 11/05/21  1026 11/09/21 1718   11/04/21 1700  ceFEPIme (MAXIPIME) 1 g in sodium chloride 0.9 % 100 mL IVPB  Status:  Discontinued        1 g 200 mL/hr over 30 Minutes Intravenous Every 24 hours 11/03/21 2034 11/05/21 1026   11/04/21 1132  vancomycin variable dose per unstable renal function (pharmacist dosing)  Status:  Discontinued         Does not apply See admin instructions 11/04/21 1132 11/05/21 0954   11/03/21 1530  ceFEPIme (MAXIPIME) 2 g in sodium chloride 0.9 % 100 mL IVPB        2 g 200 mL/hr over 30 Minutes Intravenous  Once 11/03/21 1518 11/03/21 2000   11/03/21 1530  metroNIDAZOLE (FLAGYL) IVPB 500 mg        500 mg 100 mL/hr over 60 Minutes Intravenous  Once 11/03/21 1518 11/03/21 2000   11/03/21 1530  vancomycin (VANCOCIN) IVPB 1000 mg/200 mL premix        1,000 mg 200 mL/hr over 60 Minutes Intravenous  Once 11/03/21 1518 11/03/21 2000        Assessment/Plan Prostate cancer metastatic to multiple sites Dysphagia Failure to thrive S/p PEG placement 1/10  - TF on hold for now, TPN was started  1/7 - check KUB this AM, stomach appeared dilated on imaging overnight but suspect this may have been secondary to bagging  - if stomach remains distended will place PEG to gravity - abdominal exam benign   Acute post-op VDRF - ?aspiration although no reports of emesis, vent management per CCM Hypotension and bradycardia - on levo this AM, hgb 8.0    ID - unasyn started 1/11 VTE - SCDs, ok for chemical dvt prophylaxis from surgical standpoint FEN - NPO, holding TF for now, TPN per primary  Foley - SP tube   Septic shock due to UTI - improved, completed abx Aspiration PNA - completed abx Acute on chronic renal failure Macrocytic anemia HTN Dementia   LOS: 21 days   I reviewed nursing notes, Consultant PCCM notes, last 24 h vitals and pain scores, last 48 h intake and output, last 24 h labs and trends, and last 24 h imaging results.  This care required moderate level of medical decision making.    Norm Parcel, [facility] 11/24/2021, 8:18 AM Please see Amion for pager number during day hours 7:00am-4:30pm

## 2021-11-24 NOTE — Progress Notes (Signed)
Nutrition Follow-up  DOCUMENTATION CODES:   Non-severe (moderate) malnutrition in context of chronic illness  INTERVENTION:  - continue Osmolite 1.5 @ 20 ml/hr x14 hours/day (1800-0800) to increase by 10 ml every 14 hours of run time to reach goal rate of 70 ml/hr with 45 ml Prosource TF BID and 100 ml free water QID.  - at goal rate, this regimen will provide 1550 kcal (74% kcal need), 83 grams protein (79% protein need), and 1149 ml free water.  - TPN regimen per Pharmacist.  - diet re-advancement as medically feasible.  - continue 30 ml Prosource Plus BID and Ensure Enlive BID.   NUTRITION DIAGNOSIS:   Moderate Malnutrition related to chronic illness, cancer and cancer related treatments as evidenced by mild fat depletion, moderate muscle depletion. -ongoing  GOAL:   Patient will meet greater than or equal to 90% of their needs -met with nutrition support regimen  MONITOR:   TF tolerance, Labs, Weight trends, Skin, Other (Comment) (TPN regimen)  REASON FOR ASSESSMENT:   Consult Other (Comment) (Patient on TPN but now with G-tube)  ASSESSMENT:   88 yo male with medical history of CKD, HTN, heart failure, dementia, and prostate cancer. Of note, he was hospitalized in 08/2021 with sepsis and AKI on CKD 2/2 obstruction related to metastatic prostate cancer. Patient was admitted for septic shock 2/2 UTI.  Significant Events: 12/22- admission 12/31- initial RD assessment for Calorie Count 1/7- double lumen PICC placed in R basilic; TPN initiation 7/12- 24 F G-tube placed by Surgery; Rapid Response, Code Blue, intubation   Patient was intubated yesterday at ~1915 and he was extubated earlier this AM and ~1015. G-tube remains in place and is currently clamped.  Patient discussed in rounds and once-on-one with Pharmacist. No visitors present at the time of RD visit at ~1050.  He is receiving custom TPN at goal rate of 90 ml/hr which is providing ~2100 kcal and 112 grams  protein.   Orders in place from yesterday for TF regimen to begin today (1/11): Osmolite 1.5 @ 20 ml/hr x14 hours/day (1800-0800) to increase by 10 ml every 14 hours of run time to reach goal rate of 70 ml/hr with 45 ml Prosource TF BID and 100 ml free water QID.  Weight has been stable since 1/6. Non-pitting edema to all extremities documented in the edema section of flow sheet.    Labs reviewed; CBGs: 206, 174, 144, 153 mg/dl, BUN: 43 mg/dl, creatinine: 1.28 mg/dl, Ca: 7.7 mg/dl, GFR: 55 ml/min.   Medications reviewed; sliding scale novolog, 40 mg IV protonix/day.     Diet Order:   Diet Order     None       EDUCATION NEEDS:   Not appropriate for education at this time  Skin:  Skin Assessment: Skin Integrity Issues: Skin Integrity Issues:: Stage III, DTI, Stage II, Stage I DTI: R heel Stage I: R toe Stage II: vertebral column Stage III: sacrum  Last BM:  1/8 (type 7, medium amount)  Height:   Ht Readings from Last 1 Encounters:  11/23/21 '5\' 9"'  (1.753 m)    Weight:   Wt Readings from Last 1 Encounters:  11/24/21 83 kg     Estimated Nutritional Needs:  Kcal:  2100-2300 kcal Protein:  105-120 grams Fluid:  >/= 2.3 L/day     Jarome Matin, MS, RD, LDN Inpatient Clinical Dietitian RD pager # available in [place]  After hours/weekend pager # available in [facility]

## 2021-11-24 NOTE — Progress Notes (Signed)
PHARMACY NOTE - Unasyn  Pharmacy has been assisting with dosing of Unasyn for aspiration PNA r/o. Dosage remains stable at 3g IV q8 hr and further renal adjustments per institutional Pharmacy antibiotic protocol  Pharmacy will sign off, following peripherally for culture results or dose adjustments. Please reconsult if a change in clinical status warrants re-evaluation of dosage.  Reuel Boom, PharmD, BCPS [phone] 11/24/2021, 11:29 AM

## 2021-11-24 NOTE — Progress Notes (Signed)
PHARMACY - TOTAL PARENTERAL NUTRITION CONSULT NOTE   Indication:  intolerance to oral feeds , failure to thrive  Patient Measurements: Height: _0  (175.3 cm) Weight: 83 kg (182 lb 15.7 oz) IBW/kg (Calculated) : 70.7 TPN AdjBW (KG): 76.7 Body mass index is 27.02 kg/m.  Assessment: 88 yo male with dysphagia, poor oral intake, and failure to thrive in the setting of metastatic prostate cancer. Was previously on dysphagia 1 diet, however failed calorie count. General surgery was consulted to see patient for placement of G tube, however was unable to be placed due to patient anatomy. With worsening mental status and vital signs, unable to place NG tube to begin enteral feeds. Pharmacy has been consulted to dose TPN.   1/10 PM - became bradycardic and apneic after G-tube placement earlier in the day; given atropine and intubated but no cardiac arrest/ACLS. CCM planning to extubate 1/11 AM and feels likely no insult to gut, OK to resume TFs. Surgery has requested weaning TPN once tolerating TFs.  Glucose / Insulin: no hx DM  - CBG spike after periop dexamethasone, but otherwise well controlled with minimal sensitive SSI needed - 7 units SSI yesterday (outlier d/t steroids) Electrolytes:  K became elevated yesterday, previously stable WNL, slightly improved this AM; others stable WNL Renal: SCr stable around 1.2 (no baseline for comparison); BUN elevated and rising, bicarb low but improved, UOP remains excellent Hepatic: Alk phos remains elevated ~7x ULN but stable - other enzymes, tbili, stable WNL - albumin remains low/stable - TG stable WNL (1/9) I/O: no MIVF; free water 100 ml QID ordered; no drainage from Gtube GI Imaging:  - 1/11: no obstruction GI Surgeries / Procedures:  -1/6: Attempted NG tube placement, however could not be advanced due to agitation/AMS  Central access: PICC placed 1/7 for TPN TPN start date:  1/7  Nutritional Goals: Goal TPN rate is 90 mL/hr (provides 112 g of  protein and ~2100 kcals per day)  RD Assessment: 11/23/21 Estimated Needs Total Energy Estimated Needs: 2100-2300 kcal Total Protein Estimated Needs: 105-120 grams Total Fluid Estimated Needs: >/= 2.3 L/day  Current Nutrition:  Ensure bid Osmolite 1.5 @ 20 ml/hr (goal 70 ml/hr) Prosource TF bid Goal regimen provides 1550 kcal (74%) and 83 g protein (79%)  Plan:   At 1800: Continue TPN at goal rate of 90 mL/hr at 1800 Electrolytes in TPN: remove K Na 40mq/L K 0 mEq/L while K remains elevated Ca 567m/L Mg 62m68mL Phos 162m30mL Cl:Ac Max Ac Add standard MVI and trace elements to TPN Reduce sensitive SSI to q8 hr  MIVF per MD Monitor TPN labs on Mon/Thurs Expect can start weaning tomorrow/Fri if continues to tolerate TFs    DrewReuel BoomarmD, BCPS [phone]/2023, 10:04 AM

## 2021-11-25 ENCOUNTER — Inpatient Hospital Stay (HOSPITAL_COMMUNITY): Payer: Medicare Other

## 2021-11-25 DIAGNOSIS — I959 Hypotension, unspecified: Secondary | ICD-10-CM | POA: Diagnosis not present

## 2021-11-25 DIAGNOSIS — J690 Pneumonitis due to inhalation of food and vomit: Secondary | ICD-10-CM | POA: Diagnosis not present

## 2021-11-25 LAB — GLUCOSE, CAPILLARY
Glucose-Capillary: 118 mg/dL — ABNORMAL HIGH (ref 70–99)
Glucose-Capillary: 121 mg/dL — ABNORMAL HIGH (ref 70–99)
Glucose-Capillary: 148 mg/dL — ABNORMAL HIGH (ref 70–99)
Glucose-Capillary: 90 mg/dL (ref 70–99)
Glucose-Capillary: 92 mg/dL (ref 70–99)
Glucose-Capillary: 98 mg/dL (ref 70–99)

## 2021-11-25 LAB — COMPREHENSIVE METABOLIC PANEL
ALT: 15 U/L (ref 0–44)
AST: 27 U/L (ref 15–41)
Albumin: 2.1 g/dL — ABNORMAL LOW (ref 3.5–5.0)
Alkaline Phosphatase: 329 U/L — ABNORMAL HIGH (ref 38–126)
Anion gap: 4 — ABNORMAL LOW (ref 5–15)
BUN: 50 mg/dL — ABNORMAL HIGH (ref 8–23)
CO2: 23 mmol/L (ref 22–32)
Calcium: 7.3 mg/dL — ABNORMAL LOW (ref 8.9–10.3)
Chloride: 111 mmol/L (ref 98–111)
Creatinine, Ser: 1.39 mg/dL — ABNORMAL HIGH (ref 0.61–1.24)
GFR, Estimated: 49 mL/min — ABNORMAL LOW (ref >60)
Glucose, Bld: 148 mg/dL — ABNORMAL HIGH (ref 70–99)
Potassium: 4.4 mmol/L (ref 3.5–5.1)
Sodium: 138 mmol/L (ref 135–145)
Total Bilirubin: 0.3 mg/dL (ref 0.3–1.2)
Total Protein: 6.2 g/dL — ABNORMAL LOW (ref 6.5–8.1)

## 2021-11-25 LAB — CBC
HCT: 23.6 % — ABNORMAL LOW (ref 39.0–52.0)
Hemoglobin: 7.4 g/dL — ABNORMAL LOW (ref 13.0–17.0)
MCH: 31.8 pg (ref 26.0–34.0)
MCHC: 31.4 g/dL (ref 30.0–36.0)
MCV: 101.3 fL — ABNORMAL HIGH (ref 80.0–100.0)
Platelets: 233 10*3/uL (ref 150–400)
RBC: 2.33 MIL/uL — ABNORMAL LOW (ref 4.22–5.81)
RDW: 18.8 % — ABNORMAL HIGH (ref 11.5–15.5)
WBC: 9.9 10*3/uL (ref 4.0–10.5)
nRBC: 0 % (ref 0.0–0.2)

## 2021-11-25 LAB — PHOSPHORUS: Phosphorus: 3.5 mg/dL (ref 2.5–4.6)

## 2021-11-25 LAB — MAGNESIUM: Magnesium: 1.9 mg/dL (ref 1.7–2.4)

## 2021-11-25 MED ORDER — MIDODRINE HCL 5 MG PO TABS: 5.0000 mg | ORAL_TABLET | Freq: Three times a day (TID) | ORAL | Status: DC

## 2021-11-25 MED ORDER — MIDODRINE HCL 5 MG PO TABS
5.0000 mg | ORAL_TABLET | Freq: Three times a day (TID) | ORAL | Status: DC
  Administered 2021-11-25 – 2021-11-26 (×5): 5 mg
  Filled 2021-11-25 (×5): qty 1

## 2021-11-25 MED ORDER — OSMOLITE 1.5 CAL PO LIQD
1000.0000 mL | ORAL | Status: DC
  Administered 2021-11-25 – 2021-12-06 (×13): 1000 mL
  Filled 2021-11-25 (×16): qty 1000

## 2021-11-25 MED ORDER — ZINC OXIDE 40 % EX OINT
TOPICAL_OINTMENT | Freq: Two times a day (BID) | CUTANEOUS | Status: DC
  Administered 2021-11-27 – 2021-11-29 (×3): 1 via TOPICAL
  Filled 2021-11-25: qty 57

## 2021-11-25 MED ORDER — TRAVASOL 10 % IV SOLN
INTRAVENOUS | Status: AC
  Filled 2021-11-25: qty 998.4

## 2021-11-25 MED ORDER — ADULT MULTIVITAMIN LIQUID CH
15.0000 mL | Freq: Every day | ORAL | Status: DC
  Administered 2021-11-25 – 2021-11-28 (×4): 15 mL via ORAL
  Filled 2021-11-25 (×5): qty 15

## 2021-11-25 MED ORDER — PANTOPRAZOLE 2 MG/ML SUSPENSION
40.0000 mg | Freq: Every day | ORAL | Status: DC
  Administered 2021-11-25 – 2021-12-06 (×11): 40 mg
  Filled 2021-11-25 (×12): qty 20

## 2021-11-25 MED ORDER — PROSOURCE TF PO LIQD
45.0000 mL | Freq: Three times a day (TID) | ORAL | Status: DC
  Administered 2021-11-25 – 2021-12-06 (×31): 45 mL
  Filled 2021-11-25 (×34): qty 45

## 2021-11-25 MED ORDER — FREE WATER
100.0000 mL | Status: DC
  Administered 2021-11-25 – 2021-11-29 (×25): 100 mL

## 2021-11-25 NOTE — Progress Notes (Signed)
NUTRITION NOTE  Able to talk with SLP in person. RD will adjust TF regimen to meet >/= 90% estimated needs. Will alert RN to change in TF regimen.   Osmolite 1.5 @ 25 ml/hr to start at 1300 and run over 24 hours/day, advance by 10 every 12 hours to reach goal rate of 55 ml/hr with 45 ml Prosource TF TID and 100 ml free water every 4 hours.  At goal rate, this regimen will provide 2100 kcal, 116 grams protein, and 1608 ml free water.      Jarome Matin, MS, RD, LDN Inpatient Clinical Dietitian RD pager # available in [place]  After hours/weekend pager # available in [facility]

## 2021-11-25 NOTE — Final Consult Note (Signed)
Consultant Final Sign-Off Note    Assessment/Final recommendations  Anthony Dudley is a 88 y.o. male followed by me for PEG placement. PEG placed in OR 1/10. Patient developed hypotension and bradycardia a few hours after PEG placement and became unresponsive. Atropine and LR bolus given with improvement in BP. Patient remained unresponsive but did not lose pulses. Intubated for airway protection and persistent hypoxia. Patient was able to be extubated 1/11 and abdominal exam benign. Cyclic tube feeds were initiated and patient is tolerating this well. Plan is to gradually increase tube feedings as tolerated. PEG to remain in place a minimum of 6-8 weeks but can remain longer as needed to supplement nutrition.    Wound care (if applicable): daily dressing around PEG   Diet at discharge: per primary team   Activity at discharge: per primary team   Follow-up appointment:  PRN   Pending results:  Unresulted Labs (From admission, onward)     Start     Ordered   11/26/21 2585  Basic metabolic panel  Tomorrow morning,   R       Question:  Specimen collection method  Answer:  Unit=Unit collect   11/25/21 0751   11/26/21 0500  CBC  Tomorrow morning,   R       Question:  Specimen collection method  Answer:  Unit=Unit collect   11/25/21 0751   11/26/21 0500  Magnesium  Tomorrow morning,   R       Question:  Specimen collection method  Answer:  Unit=Unit collect   11/25/21 0845   11/26/21 0500  Phosphorus  Tomorrow morning,   R       Question:  Specimen collection method  Answer:  Unit=Unit collect   11/25/21 0845   11/26/21 2778  Basic metabolic panel  Tomorrow morning,   R       Question:  Specimen collection method  Answer:  Unit=Unit collect   11/25/21 0845   11/22/21 0500  Comprehensive metabolic panel  (TPN Lab Panel)  Every Mon,Thu (0500),   R     Question:  Specimen collection method  Answer:  Lab=Lab collect   11/19/21 1413   11/22/21 0500  Magnesium  (TPN Lab Panel)  Every Mon,Thu  (0500),   R     Question:  Specimen collection method  Answer:  Lab=Lab collect   11/19/21 1413   11/22/21 0500  Phosphorus  (TPN Lab Panel)  Every Mon,Thu (0500),   R     Question:  Specimen collection method  Answer:  Lab=Lab collect   11/19/21 1413   11/22/21 0500  Triglycerides  (TPN Lab Panel)  Every Monday (0500),   R     Question:  Specimen collection method  Answer:  Lab=Lab collect   11/19/21 1413             Medication recommendations: None    Other recommendations: loose abdominal binder to keep PEG from being inadvertently removed.     Thank you for allowing [place] to participate in the care of your patient!  Please consult [place] again if you have further needs for your patient.  Norm Parcel 11/25/2021 9:14 AM    Subjective   No abdominal pain. Denies nausea. Tolerating TF.   Objective  Vital signs in last 24 hours: Temp:  [98 F (36.7 C)-99.2 F (37.3 C)] 98.9 F (37.2 C) (01/12 0800) Pulse Rate:  [45-78] 52 (01/12 0830) Resp:  [12-24] 16 (01/12 0830) BP: (88-149)/(20-80) 120/50 (01/12 0830) SpO2:  [  92 %-100 %] 93 % (01/12 0830) Weight:  [84 kg] 84 kg (01/12 0500)  Physical Exam: General: WD, elderly male who is laying in bed in NAD Heart: regular, rate, and rhythm with HR in the 50s on my examination.   Lungs: some rales bilaterally, no wheezing and respiratory effort is non-labored  Abd: soft, NT, ND, +BS, PEG in place without bleeding and is clamped   Pertinent labs and Studies: Recent Labs    11/23/21 1940 11/24/21 0400 11/25/21 0420  WBC 9.4 7.4 9.9  HGB 7.9* 8.0* 7.4*  HCT 26.2* 25.9* 23.6*   BMET Recent Labs    11/24/21 0400 11/25/21 0420  NA 136 138  K 5.1 4.4  CL 106 111  CO2 21* 23  GLUCOSE 197* 148*  BUN 43* 50*  CREATININE 1.28* 1.39*  CALCIUM 7.7* 7.3*   No results for input(s): LABURIN in the last 72 hours. Results for orders placed or performed during the hospital encounter of 11/03/21  Blood Culture (routine x 2)      Status: None   Collection Time: 11/03/21  3:55 PM   Specimen: BLOOD  Result Value Ref Range Status   Specimen Description   Final    BLOOD RIGHT ANTECUBITAL Performed at [facility] [address] [place], [place] [address]    Special Requests   Final    BOTTLES DRAWN AEROBIC AND ANAEROBIC Blood Culture results may not be optimal due to an inadequate volume of blood received in culture bottles Performed at [facility] [address] [place], [place] [address]    Culture   Final    NO GROWTH 5 DAYS Performed at [facility], [place] [address], [place], [place] [address]    Report Status 11/08/2021 FINAL  Final  Blood Culture (routine x 2)     Status: None   Collection Time: 11/03/21  4:07 PM   Specimen: BLOOD  Result Value Ref Range Status   Specimen Description   Final    BLOOD RIGHT ANTECUBITAL Performed at [facility] [address] [place], [place] [address]    Special Requests   Final    Blood Culture results may not be optimal due to an inadequate volume of blood received in culture bottles BOTTLES DRAWN AEROBIC AND ANAEROBIC Performed at [facility], [place] [address], [place], [place] [address]    Culture   Final    NO GROWTH 5 DAYS Performed at [facility], [place] [address], [place], [place] [address]    Report Status 11/08/2021 FINAL  Final  Resp Panel by RT-PCR (Flu A&B, Covid) Nasopharyngeal Swab     Status: None   Collection Time: 11/03/21  4:27 PM   Specimen: Nasopharyngeal Swab; Nasopharyngeal(NP) swabs in vial transport medium  Result Value Ref Range Status   SARS Coronavirus 2 by RT PCR NEGATIVE NEGATIVE Final    Comment: (NOTE) SARS-CoV-2 target nucleic acids are NOT DETECTED.  The SARS-CoV-2 RNA is generally detectable in upper respiratory specimens during the acute phase of infection. The lowest concentration of SARS-CoV-2 viral copies this assay can detect  is 138 copies/mL. A negative result does not preclude SARS-Cov-2 infection and should not be used as the sole basis for treatment or other patient management decisions. A negative result may occur with  improper specimen collection/handling, submission of specimen other than nasopharyngeal swab, presence of viral mutation(s) within the areas targeted by this assay, and inadequate number of  viral copies(<138 copies/mL). A negative result must be combined with clinical observations, patient history, and epidemiological information. The expected result is Negative.  Fact Sheet for Patients:  EntrepreneurPulse.com.au  Fact Sheet for Healthcare Providers:  IncredibleEmployment.be  This test is no t yet approved or cleared by the [place] FDA and  has been authorized for detection and/or diagnosis of SARS-CoV-2 by FDA under an Emergency Use Authorization (EUA). This EUA will remain  in effect (meaning this test can be used) for the duration of the COVID-19 declaration under Section 564(b)(1) of the Act, 21 U.S.C.section 360bbb-3(b)(1), unless the authorization is terminated  or revoked sooner.       Influenza A by PCR NEGATIVE NEGATIVE Final   Influenza B by PCR NEGATIVE NEGATIVE Final    Comment: (NOTE) The Xpert Xpress SARS-CoV-2/FLU/RSV plus assay is intended as an aid in the diagnosis of influenza from Nasopharyngeal swab specimens and should not be used as a sole basis for treatment. Nasal washings and aspirates are unacceptable for Xpert Xpress SARS-CoV-2/FLU/RSV testing.  Fact Sheet for Patients: EntrepreneurPulse.com.au  Fact Sheet for Healthcare Providers: IncredibleEmployment.be  This test is not yet approved or cleared by the [place] FDA and has been authorized for detection and/or diagnosis of SARS-CoV-2 by FDA under an Emergency Use Authorization (EUA). This EUA will remain in effect  (meaning this test can be used) for the duration of the COVID-19 declaration under Section 564(b)(1) of the Act, 21 U.S.C. section 360bbb-3(b)(1), unless the authorization is terminated or revoked.  Performed at [facility], [place] [address] [place], [place] [address]   Urine Culture     Status: Abnormal   Collection Time: 11/03/21  4:28 PM   Specimen: In/Out Cath Urine  Result Value Ref Range Status   Specimen Description   Final    IN/OUT CATH URINE Performed at [facility] [address], [place], [place] [address]    Special Requests   Final    NONE Performed at [facility], [place] [address], [place], [place] [address]    Culture MULTIPLE SPECIES PRESENT, SUGGEST RECOLLECTION (A)  Final   Report Status 11/05/2021 FINAL  Final  MRSA Next Gen by PCR, Nasal     Status: Abnormal   Collection Time: 11/03/21 10:00 PM   Specimen: Nasal Mucosa; Nasal Swab  Result Value Ref Range Status   MRSA by PCR Next Gen DETECTED (A) NOT DETECTED Final    Comment: CRITICAL RESULT CALLED TO, READ BACK BY AND VERIFIED WITH:  COURTNEY GRIFFIN RN 11/03/21 @ 9767 VS (NOTE) The GeneXpert MRSA Assay (FDA approved for NASAL specimens only), is one component of a comprehensive MRSA colonization surveillance program. It is not intended to diagnose MRSA infection nor to guide or monitor treatment for MRSA infections. Test performance is not FDA approved in patients less than 70 years old. Performed at [facility], [place] [address], [place], [place] [address]   Culture, blood (Routine X 2) w Reflex to ID Panel     Status: None   Collection Time: 11/17/21 11:26 AM   Specimen: BLOOD LEFT HAND  Result Value Ref Range Status   Specimen Description   Final    BLOOD LEFT HAND Performed at [facility] [address], [place], [place] [address]    Special Requests   Final    BOTTLES DRAWN AEROBIC ONLY Blood  Culture results may not be optimal due to an inadequate volume of blood received  in culture bottles Performed at [facility], [place] [address], [place], [place] [address]    Culture   Final    NO GROWTH 5 DAYS Performed at [facility], [place] [address] [place], [place] [address]    Report Status 11/22/2021 FINAL  Final  Urine Culture     Status: None   Collection Time: 11/17/21 11:44 AM   Specimen: Urine, Clean Catch  Result Value Ref Range Status   Specimen Description   Final    URINE, CLEAN CATCH Performed at [facility], [place] [address] [place], [place] [address]    Special Requests   Final    Immunocompromised Performed at [facility], [place] [address] [place], [place] [address]    Culture   Final    NO GROWTH Performed at [facility], [place] [address] [place], [place] [address]    Report Status 11/18/2021 FINAL  Final  Culture, blood (Routine X 2) w Reflex to ID Panel     Status: None   Collection Time: 11/17/21 11:45 AM   Specimen: BLOOD  Result Value Ref Range Status   Specimen Description   Final    BLOOD RIGHT WRIST Performed at [facility] [address] [place], [place] [address]    Special Requests   Final    BOTTLES DRAWN AEROBIC AND ANAEROBIC Blood Culture adequate volume Performed at [facility] [address], [place], [place] [address]    Culture   Final    NO GROWTH 5 DAYS Performed at [facility], [place] [address] [place],  [address]    Report Status 11/22/2021 FINAL  Final    Imaging: DG CHEST PORT 1 VIEW  Result Date: 11/25/2021 CLINICAL DATA:  Resp failure EXAM: PORTABLE CHEST 1 VIEW COMPARISON:  11/23/2021 FINDINGS: Interval extubation. Mild improvement aeration of the lungs. PICC line unchanged. Patient rotated rightward. Stable mediastinum cardiac silhouette with ectatic aorta. Patchy airspace disease in LEFT lower lobe.  IMPRESSION: 1. Interval extubation.  Mild improvement aeration of the lungs. 2. Patchy airspace disease in LEFT lower lobe suggest pneumonia. Electronically Signed   By: Suzy Bouchard M.D.   On: 11/25/2021 07:50   DG Abd Portable 1V  Result Date: 11/24/2021 CLINICAL DATA:  Abdominal distension EXAM: PORTABLE ABDOMEN - 1 VIEW COMPARISON:  Yesterday FINDINGS: Internal ureteral stents in stable position. Decreased gaseous distension of the stomach. Percutaneous gastrostomy tube is noted over the central abdomen. Extensive osseous metastatic disease with redemonstrated L1 fracture. IMPRESSION: Improved gaseous distension of the stomach. No specific obstructive pattern. Electronically Signed   By: Jorje Guild M.D.   On: 11/24/2021 09:49

## 2021-11-25 NOTE — TOC Progression Note (Signed)
Transition of Care [facility]) - Progression Note    Patient Details  Name: Anthony Dudley MRN: [id number] Date of Birth: [birth year]  Transition of Care [facility]) CM/SW Contact  Lennart Pall, LCSW Phone Number: 11/25/2021, 10:10 AM  Clinical Narrative:    TOC continues to follow to assist with pt's return to LTC bed at [facility] when medically ready.   Expected Discharge Plan: Long Term Nursing Home Barriers to Discharge: Continued Medical Work up  Expected Discharge Plan and Services Expected Discharge Plan: [place]                                               Social Determinants of Health (SDOH) Interventions    Readmission Risk Interventions No flowsheet data found.

## 2021-11-25 NOTE — Progress Notes (Signed)
PHARMACY - [facility] NUTRITION CONSULT NOTE   Indication:  intolerance to oral feeds , failure to thrive  Patient Measurements: Height: _0  (175.3 cm) Weight: 84 kg (185 lb 3 oz) IBW/kg (Calculated) : 70.7 TPN AdjBW (KG): 76.7 Body mass index is 27.35 kg/m.  Assessment: 88 yo male with dysphagia, poor oral intake, and failure to thrive in the setting of metastatic prostate cancer. Was previously on dysphagia 1 diet, however failed calorie count. General surgery was consulted to see patient for placement of G tube, however was unable to be placed due to patient anatomy. With worsening mental status and vital signs, unable to place NG tube to begin enteral feeds. Pharmacy has been consulted to dose TPN.   1/10 PM - became bradycardic and apneic after G-tube placement earlier in the day; given atropine and intubated but no cardiac arrest/ACLS. CCM planning to extubate 1/11 AM and feels likely no insult to gut, OK to resume TFs. Surgery has requested weaning TPN once tolerating TFs.  Glucose / Insulin: no hx DM  - CBG spike after periop dexamethasone, but otherwise well controlled with minimal sensitive SSI needed, CBGs 119- 151 - 4 units SSI yesterday  Electrolytes:  WNL x CoCa 8.82- on low end; no K in TPN Renal: SCr 1.39, up some (no baseline for comparison); BUN elevated and rising, bicarb WNL today, UOP remains excellent Hepatic: Alk phos remains elevated ~7x ULN but stable - other enzymes, tbili, stable WNL - albumin remains low/stable - TG stable WNL (1/9) I/O: no MIVF; free water 100 ml QID ordered; no drainage from Gtube GI Imaging:  - 1/11: no obstruction GI Surgeries / Procedures:  -1/6: Attempted NG tube placement, however could not be advanced due to agitation/AMS  Central access: PICC placed 1/7 for TPN TPN start date:  1/7  Nutritional Goals: Goal TPN rate is 90 mL/hr (provides 112 g of protein and ~2100 kcals per day)  RD Assessment: 11/23/21 Estimated  Needs Total Energy Estimated Needs: 2100-2300 kcal Total Protein Estimated Needs: 105-120 grams Total Fluid Estimated Needs: >/= 2.3 L/day  Current Nutrition:  Ensure bid Osmolite 1.5 @ 20 ml/hr (goal 70 ml/hr) Prosource TF bid Goal regimen provides 1550 kcal (74%) and 83 g protein (79%)  Plan:   At 1800: Reduce TPN to 80 mL/hr at 1800 Electrolytes in TPN:  Na 53mq/L Increase K 167m/L Increase Ca 68m51mL Mg 5mE368m Phos 15mm33m Cl:Ac Max Ac Change MVI to per tube Continue sensitive SSI to q8 hr  MIVF per MD Monitor TPN labs on Mon/Thurs, BMET, Mg & Phos in AM continue weaning as he continues to tolerate TFs  MicheEudelia Bunchrm.D 11/25/2021 8:41 AM

## 2021-11-25 NOTE — Evaluation (Signed)
Physical Therapy Evaluation Patient Details Name: Anthony Dudley MRN: [id number] DOB: 07/03/1933 Today's Date: 11/25/2021  History of Present Illness  88 yo male admitted with UTI, septic shock. Hx of dementia, CKD, CHF, prostate ca.  Code blue called 11/23/21 and pt intubated with extubation following date  Clinical Impression  Pt pleasant and conversant and follows most simple cues but states he lives in [place].  This date, pt up to EOB sitting but requiring max assist of 2 and unable to balance unassisted at EOB.  Pt with low tolerance and requesting to return to supine.  Pt states he rarely gets OOB at SNF and prefers to not be bothered with attempts.  Pt with noted bil LE contractures to reinforce pt report.  Pt is custodial care at this point and is not a good candidate for rehab at this time.  PT will sign off.     Recommendations for follow up therapy are one component of a multi-disciplinary discharge planning process, led by the attending physician.  Recommendations may be updated based on patient status, additional functional criteria and insurance authorization.  Follow Up Recommendations No PT follow up    Assistance Recommended at Discharge Frequent or constant Supervision/Assistance  Patient can return home with the following       Equipment Recommendations None recommended by PT  Recommendations for Other Services       Functional Status Assessment Patient has not had a recent decline in their functional status     Precautions / Restrictions Precautions Precautions: Fall Restrictions Weight Bearing Restrictions: No      Mobility  Bed Mobility Overal bed mobility: Needs Assistance Bed Mobility: Supine to Sit;Sit to Supine     Supine to sit: +2 for physical assistance;+2 for safety/equipment;Max assist Sit to supine: Max assist;+2 for physical assistance;+2 for safety/equipment   General bed mobility comments: Assist to mangage Bil LEs, to control trunk  and to rotate to/from EOB with use of bed pad    Transfers                   General transfer comment: NT, 2* pt performance and noted contractures bil LEs    Ambulation/Gait                  Stairs            Wheelchair Mobility    Modified Rankin (Stroke Patients Only)       Balance Overall balance assessment: Needs assistance Sitting-balance support: Bilateral upper extremity supported;Feet supported Sitting balance-Leahy Scale: Poor                                       Pertinent Vitals/Pain Pain Assessment: Faces Faces Pain Scale: Hurts a little bit Facial Expression: Relaxed, neutral Body Movements: Absence of movements Muscle Tension: Relaxed Compliance with ventilator (intubated pts.): Tolerating ventilator or movement Vocalization (extubated pts.): Talking in normal tone or no sound CPOT Total: 0 Pain Location: Bil LEs with attempts to range (R >L) Pain Descriptors / Indicators: Grimacing Pain Intervention(s): Limited activity within patient's tolerance;Monitored during session;Repositioned    Home Living Family/patient expects to be discharged to:: Skilled nursing facility                   Additional Comments: Pt is long term resident    Prior Function Prior Level of Function : Needs assist  Physical Assist : Mobility (physical);ADLs (physical)     Mobility Comments: Pt reports he largely stays in bed.  Noted contractures bil LEs       Hand Dominance        Extremity/Trunk Assessment   Upper Extremity Assessment Upper Extremity Assessment: Generalized weakness    Lower Extremity Assessment Lower Extremity Assessment: Generalized weakness;RLE deficits/detail;LLE deficits/detail RLE Deficits / Details: Noted weakness and decreased ROM bil LEs (R>L) all planes with significant contractures already in place    Cervical / Trunk Assessment Cervical / Trunk Assessment: Kyphotic  Communication    Communication: No difficulties  Cognition Arousal/Alertness: Awake/alert Behavior During Therapy: WFL for tasks assessed/performed Overall Cognitive Status: History of cognitive impairments - at baseline                                          General Comments      Exercises     Assessment/Plan    PT Assessment Patient does not need any further PT services  PT Problem List         PT Treatment Interventions      PT Goals (Current goals can be found in the Care Plan section)  Acute Rehab PT Goals Patient Stated Goal: Lay back down PT Goal Formulation: All assessment and education complete, DC therapy    Frequency       Co-evaluation               AM-PAC PT "6 Clicks" Mobility  Outcome Measure Help needed turning from your back to your side while in a flat bed without using bedrails?: Total Help needed moving from lying on your back to sitting on the side of a flat bed without using bedrails?: Total Help needed moving to and from a bed to a chair (including a wheelchair)?: Total Help needed standing up from a chair using your arms (e.g., wheelchair or bedside chair)?: Total Help needed to walk in hospital room?: Total Help needed climbing 3-5 steps with a railing? : Total 6 Click Score: 6    End of Session   Activity Tolerance: Patient limited by fatigue Patient left: in bed;with call bell/phone within reach;with bed alarm set Nurse Communication: Mobility status;Need for lift equipment      Time: 4196-2229 PT Time Calculation (min) (ACUTE ONLY): 21 min   Charges:   PT Evaluation $PT Eval Low Complexity: 1 Low          Debe Coder PT Acute Rehabilitation Services Pager [phone] Office [phone]   Rokia Bosket 11/25/2021, 1:01 PM

## 2021-11-25 NOTE — Progress Notes (Signed)
OT Cancellation Note  Patient Details Name: Anthony Dudley MRN: [id number] DOB: [birth year]   Cancelled Treatment:    Reason Eval/Treat Not Completed: OT screened, no needs identified, will sign off. Per PT patient is a long term care resident with contractures of lower extremities and requires total care. Patient is not rehab appropriate. Recommend return to facility with 24/7 assistance.  [name] 11/25/2021, 12:18 PM

## 2021-11-25 NOTE — Progress Notes (Signed)
NUTRITION NOTE  Full follow-up done by this RD yesterday (1/11).  Patient discussed in rounds this AM, one-on-one with RN, and via secure chat with Pharmacist earlier this AM and with SLP a short time ago.   Patient has remained NPO after G-tube placement on 1/10.  He is receiving custom TPN at goal rate of 90 ml/hr with is providing ~2100 kcal and 112 grams protein.   Osmolite 1.5 started at 20 ml/hr via G-tube yesterday at 1800 and ordered to run x14 hours/day (1800-0800).   Ordered TF regimen is Osmolite 1.5 @ 20 ml/hr x14 hours/day (1800-0800) to increase by 10 ml every 14 hours of run time to reach goal rate of 70 ml/hr with 45 ml Prosource TF BID and 100 ml free water QID.   At goal rate, this regimen will provide 1550 kcal (74% kcal need), 83 grams protein (79% protein need), and 1149 ml free water.  After discussion with SLP and RN, it remains unclear to RD if diet will be advanced or if patient will remain NPO. RD discontinued Prosource Plus and Ensure Enlive in light of this.  Communicated with RN to let RD know if plan concerning diet becomes known later this shift so that TF regimen can be adjusted, if warranted.     Estimated Nutritional Needs:  Kcal:  2100-2300 kcal Protein:  105-120 grams Fluid:  >/= 2.3 L/day    Jarome Matin, MS, RD, LDN Inpatient Clinical Dietitian RD pager # available in [place]  After hours/weekend pager # available in [facility]

## 2021-11-25 NOTE — Progress Notes (Signed)
NAMESagar Dudley, MRN:  [id number], DOB:  07/05/1933, LOS: 58 ADMISSION DATE:  11/03/2021 CONSULTATION DATE:  11/23/2021 REFERRING MD:  Verlon Au - TR[facility] CHIEF COMPLAINT:  Symptomatic bradycardia  History of Present Illness:   88 yo male presented to [facility] ER on 12/21 with fever from recurrent UTI.  He has hx of metastatic prostate cancer, urethral erosion, and bilateral hydronephrosis.  He was treated with broad spectrum ABx and pressors.  Had clinical improvement and transferred to hospitalist service on 12/25.  He subsequently had cystoscopy with b/l ureteral stent exchange and suprapubic catheter placement on 12/29.  He had dysphagia with poor caloric intake and started on TPN 1/07.  He had PEG placement on 1/10.  Post procedure he developed bradycardia with hypotension and altered mental status.  He was intubated, started on pressors and IV fluids, and transferred to ICU.  PCCM resumed primary management in ICU.  Pertinent Medical History:  GERD, Anemia, Anxiety, Depression, CKD 3b, Dementia, Metastatic prostate cancer, SVT, CHF, Sepsis, Failure to thrive  Significant Hospital Events: Including procedures, antibiotic start and stop dates in addition to other pertinent events   12/21 Admit with urosepsis, septic shock  12/25 to [facility]  12/29 Cystoscopy with bilateral ureteral stent exchange and suprapubic catheter placement  1/03 CCS consulted for PEG due to persistent dysphagia and poor intake  1/07 PICC placed for TPN  1/10 PEG placement per CCS.  Post placement on floor, pt developed hypotension and bradycardia to 30's requiring BVM.  Intubated by anesthesia.  LR bolus + atropine. Did not require CPR.  1/11 Extubated   Interim History / Subjective:  Breathing okay.  Denies chest, abdominal pain.  Remains on pressors.  Objective:  Blood pressure 112/80, pulse 69, temperature 98.3 F (36.8 C), temperature source Axillary, resp. rate 16, height 5\' 9"  (1.753 m), weight 84 kg, SpO2 92 %.     Vent Mode: PSV FiO2 (%):  [30 %] 30 % PEEP:  [5 cmH20] 5 cmH20 Pressure Support:  [5 cmH20] 5 cmH20   Intake/Output Summary (Last 24 hours) at 11/25/2021 0732 Last data filed at 11/25/2021 0500 Gross per 24 hour  Intake 2962.38 ml  Output 4400 ml  Net -1437.62 ml   Filed Weights   11/22/21 0500 11/24/21 0500 11/25/21 0500  Weight: 84 kg 83 kg 84 kg   Physical Exam:  General - alert Eyes - pupils reactive ENT - no sinus tenderness, no stridor, weak voice quality Cardiac - irregular Chest - equal breath sounds b/l, no wheezing or rales Abdomen - soft, non tender, G tube and suprapubic catheter dressings clean Extremities - prevalon boots on Skin - sacral wound, heel wounds, toe pressure ulcers w/o change Neuro - follows commands appropriately Psych - normal mood and behavior   Resolved Hospital Problem List:  Acute metabolic encephalopathy from sedation and hypoxia  Assessment & Plan:   Acute hypoxic respiratory failure likely from sedation during procedure. - successfully extubated 1/11 - bronchial hygiene   Aspiration pneumonitis. - day 2/5 of unasyn - f/u CXR intermittently   Intermittent bradycardia and hypotension. - d/c'ed lopressor 1/11 - add midodrone 1/12 - wean levophed to keep SBP > 90   Dysphagia. Severe protein calorie malnutrition. - transition off TPN once his tube feeding approaches goal amount   Metastatic prostate cancer. Chronic urine retention s/p suprapubic catheter - followed by urology   Macrocytic anemia. - f/u CBC - transfuse for Hb < 7   Hyperglycemia. - SSI   Pressure injuries. -  stage 3 sacrum, POA - Rt heel deep tissue injury, not present on admission - stage 2 vertebral column, not present on admission - stage 1 toes, not present on admission - wound care  Best Practice: (right click and "Reselect all SmartList Selections" daily)  Diet/type: tube feeds, TPN DVT prophylaxis: SQ heparin GI prophylaxis: protonix Lines:  Rt double lumen PICC 1/07 >>  Foley:  Suprapubic catheter 12/29 >>  Code Status:  full code Last date of multidisciplinary goals of care discussion: bother updated 1/11  Labs:   CMP Latest Ref Rng & Units 11/25/2021 11/24/2021 11/23/2021  Glucose 70 - 99 mg/dL 148(H) 197(H) 313(H)  BUN 8 - 23 mg/dL 50(H) 43(H) 39(H)  Creatinine 0.61 - 1.24 mg/dL 1.39(H) 1.28(H) 1.38(H)  Sodium 135 - 145 mmol/L 138 136 137  Potassium 3.5 - 5.1 mmol/L 4.4 5.1 5.3(H)  Chloride 98 - 111 mmol/L 111 106 108  CO2 22 - 32 mmol/L 23 21(L) 23  Calcium 8.9 - 10.3 mg/dL 7.3(L) 7.7(L) 7.7(L)  Total Protein 6.5 - 8.1 g/dL 6.2(L) - 5.8(L)  Total Bilirubin 0.3 - 1.2 mg/dL 0.3 - 0.3  Alkaline Phos 38 - 126 U/L 329(H) - 344(H)  AST 15 - 41 U/L 27 - 20  ALT 0 - 44 U/L 15 - 14    CBC Latest Ref Rng & Units 11/25/2021 11/24/2021 11/23/2021  WBC 4.0 - 10.5 K/uL 9.9 7.4 9.4  Hemoglobin 13.0 - 17.0 g/dL 7.4(L) 8.0(L) 7.9(L)  Hematocrit 39.0 - 52.0 % 23.6(L) 25.9(L) 26.2(L)  Platelets 150 - 400 K/uL 233 223 217    ABG    Component Value Date/Time   PHART 7.455 (H) 11/24/2021 0620   PCO2ART 31.3 (L) 11/24/2021 0620   PO2ART 115 (H) 11/24/2021 0620   HCO3 21.7 11/24/2021 0620   ACIDBASEDEF 1.4 11/24/2021 0620   O2SAT 99.2 11/24/2021 0620    CBG (last 3)  Recent Labs    11/24/21 1941 11/24/21 2329 11/25/21 0408  GLUCAP 151* 150* 148*    Signature:  Chesley Mires, MD [place] Pager - [phone] 11/25/2021, 7:46 AM

## 2021-11-26 DIAGNOSIS — A419 Sepsis, unspecified organism: Secondary | ICD-10-CM | POA: Diagnosis not present

## 2021-11-26 DIAGNOSIS — N390 Urinary tract infection, site not specified: Secondary | ICD-10-CM | POA: Diagnosis not present

## 2021-11-26 DIAGNOSIS — G9341 Metabolic encephalopathy: Secondary | ICD-10-CM | POA: Diagnosis not present

## 2021-11-26 DIAGNOSIS — R6521 Severe sepsis with septic shock: Secondary | ICD-10-CM | POA: Diagnosis not present

## 2021-11-26 LAB — CBC

## 2021-11-26 LAB — GLUCOSE, CAPILLARY
Glucose-Capillary: 131 mg/dL — ABNORMAL HIGH (ref 70–99)
Glucose-Capillary: 133 mg/dL — ABNORMAL HIGH (ref 70–99)
Glucose-Capillary: 123 mg/dL — ABNORMAL HIGH (ref 70–99)
Glucose-Capillary: 180 mg/dL — ABNORMAL HIGH (ref 70–99)
Glucose-Capillary: 151 mg/dL — ABNORMAL HIGH (ref 70–99)
Glucose-Capillary: 140 mg/dL — ABNORMAL HIGH (ref 70–99)

## 2021-11-26 LAB — MAGNESIUM: Magnesium: 1.8 mg/dL (ref 1.7–2.4)

## 2021-11-26 LAB — BASIC METABOLIC PANEL
Anion gap: 8 (ref 5–15)
BUN: 59 mg/dL — ABNORMAL HIGH (ref 8–23)
CO2: 21 mmol/L — ABNORMAL LOW (ref 22–32)
Calcium: 7.5 mg/dL — ABNORMAL LOW (ref 8.9–10.3)
Chloride: 105 mmol/L (ref 98–111)
Creatinine, Ser: 1.37 mg/dL — ABNORMAL HIGH (ref 0.61–1.24)
GFR, Estimated: 50 mL/min — ABNORMAL LOW (ref >60)
Glucose, Bld: 123 mg/dL — ABNORMAL HIGH (ref 70–99)
Potassium: 3.4 mmol/L — ABNORMAL LOW (ref 3.5–5.1)
Sodium: 134 mmol/L — ABNORMAL LOW (ref 135–145)

## 2021-11-26 LAB — PHOSPHORUS: Phosphorus: 3.6 mg/dL (ref 2.5–4.6)

## 2021-11-26 MED ORDER — POTASSIUM CHLORIDE 20 MEQ PO PACK
40.0000 meq | PACK | Freq: Once | ORAL | Status: AC
  Administered 2021-11-26: 40 meq via ORAL
  Filled 2021-11-26: qty 2

## 2021-11-26 MED ORDER — HYDROCORTISONE SOD SUC (PF) 100 MG IJ SOLR: 50.0000 mg | Freq: Four times a day (QID) | INTRAMUSCULAR | Status: DC

## 2021-11-26 MED ORDER — MIDODRINE HCL 5 MG PO TABS
10.0000 mg | ORAL_TABLET | Freq: Three times a day (TID) | ORAL | Status: DC
  Administered 2021-11-26 – 2021-12-02 (×19): 10 mg
  Filled 2021-11-26 (×20): qty 2

## 2021-11-26 MED ORDER — HYDROCORTISONE SOD SUC (PF) 100 MG IJ SOLR
100.0000 mg | Freq: Two times a day (BID) | INTRAMUSCULAR | Status: DC
  Administered 2021-11-26 – 2021-12-01 (×11): 100 mg via INTRAVENOUS
  Filled 2021-11-26 (×11): qty 2

## 2021-11-26 MED ORDER — TRAVASOL 10 % IV SOLN
INTRAVENOUS | Status: DC
  Filled 2021-11-26: qty 499.2

## 2021-11-26 NOTE — Progress Notes (Addendum)
Anthony Dudley, MRN:  [id number], DOB:  [birth year], LOS: 31 ADMISSION DATE:  11/03/2021 CONSULTATION DATE:  11/23/2021 REFERRING MD:  Verlon Au - TRH CHIEF COMPLAINT:  Symptomatic bradycardia  BRIEF   88 yo male presented to [facility] ER on 12/21 with fever from recurrent UTI.  He has hx of metastatic prostate cancer, urethral erosion, and bilateral hydronephrosis.  He was treated with broad spectrum ABx and pressors.  Had clinical improvement and transferred to hospitalist service on 12/25.  He subsequently had cystoscopy with b/l ureteral stent exchange and suprapubic catheter placement on 12/29.  He had dysphagia with poor caloric intake and started on TPN 1/07.  He had PEG placement on 1/10.  Post procedure he developed bradycardia with hypotension and altered mental status.  He was intubated, started on pressors and IV fluids, and transferred to ICU.  PCCM resumed primary management in ICU.  Pertinent Medical History:  GERD, Anemia, Anxiety, Depression, CKD 3b, Dementia, Metastatic prostate cancer, SVT, CHF, Sepsis, Failure to thrive  Significant Hospital Events: Including procedures, antibiotic start and stop dates in addition to other pertinent events   12/21 Admit with urosepsis, septic shock  12/25 to [facility]  12/29 Cystoscopy with bilateral ureteral stent exchange and suprapubic catheter placement  1/03 CCS consulted for PEG due to persistent dysphagia and poor intake  1/07 PICC placed for TPN  1/10 PEG placement per CCS.  Post placement on floor, pt developed hypotension and bradycardia to 30's requiring BVM.  Intubated by anesthesia.  LR bolus + atropine. Did not require CPR.  1/11 Extubated  1/12 - Breathing okay.  Denies chest, abdominal pain.  Remains on pressors.  Interim History / Subjective:   1/14 - levophed 76mcg. Goal sbp > 90 per RN. Has PEG tube now and suprapubic. RN reports baseline failure to thrive  Objective:  Blood pressure (!) 95/25, pulse (!) 55, temperature 98.1  F (36.7 C), temperature source Oral, resp. rate (!) 24, height 5\' 9"  (1.753 m), weight 84 kg, SpO2 94 %.        Intake/Output Summary (Last 24 hours) at 11/26/2021 0925 Last data filed at 11/26/2021 6546 Gross per 24 hour  Intake 3783.06 ml  Output 2800 ml  Net 983.06 ml   Filed Weights   11/24/21 0500 11/25/21 0500 11/26/21 0404  Weight: 83 kg 84 kg 84 kg   Physical Exam:    Resolved Hospital Problem List:  Acute metabolic encephalopathy from sedation and hypoxia  Assessment & Plan:   Acute hypoxic respiratory failure likely from sedation during procedure. - successfully extubated 1/11 - bronchial hygiene - remains at risk fo rreintubation   Aspiration pneumonitis. 11/26/2021 - afebrile  Plan - day 3/5 of unasyn - f/u CXR intermittently   Intermittent bradycardia and hypotension. - d/c'ed lopressor 1/11 - add midodrone 1/12  11/26/2021 - still on levophed  Plan - wean levophed to keep SBP > 90 - add hydrocortisoone     Dysphagia. Severe protein calorie malnutrition. S/p PEG  11/26/2021 - TPN and TF  plan - transition off TPN once his tube feeding approaches goal amount   Metastatic prostate cancer. Chronic urine retention s/p suprapubic catheter - followed by urology   Macrocytic anemia.  11/26/2021 - Hgb 7.4  Plan - f/u CBC - transfuse for Hb < 7   Hyperglycemia. - SSI   Failure To Thrive - Prior to & Present on Admit SNF resident - Prior to & Present on Admit Severe Protein Calorie Malnutrition - alb 2s -  Prior to & Present on Admit Pressure injuries. - stage 3 sacrum, POA - Rt heel deep tissue injury, not present on admission - stage 2 vertebral column, not present on admission - stage 1 toes, not present on admission - wound care  Best Practice: (right click and "Reselect all SmartList Selections" daily)  Diet/type: tube feeds, TPN DVT prophylaxis: SQ heparin GI prophylaxis: protonix Lines: Rt double lumen PICC 1/07 >>  Foley:   Suprapubic catheter 12/29 >>  Code Status:  full code Last date of multidisciplinary goals of care discussion: bother updated 1/11 - 1/13. Called brother on mobile his scretary answered phone -> then he called back    - Brother [place] lives in [place], [place]. He ias an Forensic psychologist . Says he does living will and is sensitive to the issue. In Oct 2022 he said he had extensive discussions ith patient who has emphasized desire to live as long as possible (quantity > quality). So, right now full code. He said he will make decisions on comfor care based on his asessment of patient talking to medical doctors. He does NOT want palliative care involved. He understands palliative care is not hospice and not terminal care but feels that the goals here are clear and so does not want palliative care involved Expressed we will respect that   Jesup   The patient Anthony Dudley is critically ill with multiple organ systems failure and requires high complexity decision making for assessment and support, frequent evaluation and titration of therapies, application of advanced monitoring technologies and extensive interpretation of multiple databases.   Critical Care Time devoted to patient care services described in this note is  45  Minutes. This time reflects time of care of this signee Dr Brand Males. This critical care time does not reflect procedure time, or teaching time or supervisory time of PA/NP/Med student/Med Resident etc but could involve care discussion time     Dr. Brand Males, M.D., [facility].C.P Pulmonary and Critical Care Medicine Staff Physician [facility] Pager: [phone], If no answer or between  15:00h - 7:00h: call [phone]  11/26/2021 9:25 AM    LABS    PULMONARY Recent Labs  Lab 11/23/21 1944 11/24/21 0620  PHART 7.269* 7.455*  PCO2ART 46.2 31.3*  PO2ART 314* 115*  HCO3 20.7 21.7  O2SAT 99.5 99.2     CBC Recent Labs  Lab 11/24/21 0400 11/25/21 0420 11/26/21 0426  HGB 8.0* 7.4* ACCURACY OF RESULTS QUESTIONABLE. RECOMMEND RECOLLECT TO VERIFY.  HCT 25.9* 23.6* ACCURACY OF RESULTS QUESTIONABLE. RECOMMEND RECOLLECT TO VERIFY.  WBC 7.4 9.9 ACCURACY OF RESULTS QUESTIONABLE. RECOMMEND RECOLLECT TO VERIFY.  PLT 223 233 ACCURACY OF RESULTS QUESTIONABLE. RECOMMEND RECOLLECT TO VERIFY.    COAGULATION No results for input(s): INR in the last 168 hours.  CARDIAC  No results for input(s): TROPONINI in the last 168 hours. No results for input(s): PROBNP in the last 168 hours.   CHEMISTRY Recent Labs  Lab 11/22/21 0348 11/23/21 0312 11/23/21 1940 11/24/21 0400 11/25/21 0420 11/26/21 0426 11/26/21 0600  NA 140 138 137 136 138  --  134*  K 4.6 4.3 5.3* 5.1 4.4  --  3.4*  CL 116* 115* 108 106 111  --  105  CO2 19* 20* 23 21* 23  --  21*  GLUCOSE 112* 115* 313* 197* 148*  --  123*  BUN 23 35* 39* 43* 50*  --  59*  CREATININE 1.22 1.29* 1.38* 1.28* 1.39*  --  1.37*  CALCIUM 7.4* 7.4* 7.7* 7.7* 7.3*  --  7.5*  MG 2.0 2.0  --  1.9 1.9 ACCURACY OF RESULTS QUESTIONABLE. RECOMMEND RECOLLECT TO VERIFY.  --   PHOS 3.2 3.4  --  3.4 3.5 ACCURACY OF RESULTS QUESTIONABLE. RECOMMEND RECOLLECT TO VERIFY.  --    Estimated Creatinine Clearance: 38.7 mL/min (A) (by C-G formula based on SCr of 1.37 mg/dL (H)).   LIVER Recent Labs  Lab 11/20/21 0521 11/22/21 0348 11/23/21 0312 11/23/21 1940 11/25/21 0420  AST 18 16 18 20 27   ALT 13 12 13 14 15   ALKPHOS 246* 312* 333* 344* 329*  BILITOT 0.3 0.3 0.3 0.3 0.3  PROT 5.1* 5.3* 5.7* 5.8* 6.2*  ALBUMIN 1.6* 1.8* 1.9* 1.9* 2.1*     INFECTIOUS Recent Labs  Lab 11/23/21 1941  LATICACIDVEN 2.7*   1.2     ENDOCRINE CBG (last 3)  Recent Labs    11/25/21 2349 11/26/21 0345 11/26/21 0738  GLUCAP 121* 131* 133*         IMAGING x48h  - image(s) personally visualized  -   highlighted in bold DG CHEST PORT 1 VIEW  Result Date:  11/25/2021 CLINICAL DATA:  Resp failure EXAM: PORTABLE CHEST 1 VIEW COMPARISON:  11/23/2021 FINDINGS: Interval extubation. Mild improvement aeration of the lungs. PICC line unchanged. Patient rotated rightward. Stable mediastinum cardiac silhouette with ectatic aorta. Patchy airspace disease in LEFT lower lobe. IMPRESSION: 1. Interval extubation.  Mild improvement aeration of the lungs. 2. Patchy airspace disease in LEFT lower lobe suggest pneumonia. Electronically Signed   By: Suzy Bouchard M.D.   On: 11/25/2021 07:50   DG Abd Portable 1V  Result Date: 11/24/2021 CLINICAL DATA:  Abdominal distension EXAM: PORTABLE ABDOMEN - 1 VIEW COMPARISON:  Yesterday FINDINGS: Internal ureteral stents in stable position. Decreased gaseous distension of the stomach. Percutaneous gastrostomy tube is noted over the central abdomen. Extensive osseous metastatic disease with redemonstrated L1 fracture. IMPRESSION: Improved gaseous distension of the stomach. No specific obstructive pattern. Electronically Signed   By: Jorje Guild M.D.   On: 11/24/2021 09:49

## 2021-11-26 NOTE — Progress Notes (Signed)
PHARMACY - TOTAL PARENTERAL NUTRITION CONSULT NOTE   Indication:  intolerance to oral feeds , failure to thrive  Patient Measurements: Height: '5\' 9"'  (175.3 cm) Weight: 84 kg (185 lb 3 oz) IBW/kg (Calculated) : 70.7 TPN AdjBW (KG): 76.7 Body mass index is 27.35 kg/m.  Assessment: 88 yo male with dysphagia, poor oral intake, and failure to thrive in the setting of metastatic prostate cancer. Was previously on dysphagia 1 diet, however failed calorie count. General surgery was consulted to see patient for placement of G tube, however was unable to be placed due to patient anatomy. With worsening mental status and vital signs, unable to place NG tube to begin enteral feeds. Pharmacy has been consulted to dose TPN.   1/10 PM - became bradycardic and apneic after G-tube placement earlier in the day; given atropine and intubated but no cardiac arrest/ACLS. CCM planning to extubate 1/11 AM and feels likely no insult to gut, OK to resume TFs. Surgery has requested weaning TPN once tolerating TFs.  Glucose / Insulin: no hx DM  - CBG spike after periop dexamethasone, but otherwise well controlled with minimal sensitive SSI needed - 2 units SSI yesterday  Electrolytes: all WNL except K now low after removing from TPN (previously > 5.0) and Na decreased to borderline low Renal: SCr elevated but stable (no baseline for comparison); BUN elevated and continues to rise, bicarb remains borderline low, UOP remains excellent Hepatic (1/12): Alk phos remains elevated ~7x ULN but stable - other enzymes, tbili, stable WNL - albumin remains low/stable - TG stable WNL (1/9) I/O: no MIVF; free water 100 ml QID ordered; no drainage from Gtube GI Imaging:  - 1/11: no obstruction GI Surgeries / Procedures:  -1/6: Attempted NG tube placement, however could not be advanced due to agitation/AMS  Central access: PICC placed 1/7 for TPN TPN start date:  1/7  Nutritional Goals: Goal TPN rate is 90 mL/hr (provides  112 g of protein and ~2100 kcals per day)  RD Assessment: 11/23/21 Estimated Needs Total Energy Estimated Needs: 2100-2300 kcal Total Protein Estimated Needs: 105-120 grams Total Fluid Estimated Needs: >/= 2.3 L/day  Current Nutrition:  Osmolite 1.5 @ 35 ml/hr (goal 55 ml/hr) Prosource TF bid Goal regimen provides > 90% of kcal and protein needs  Plan:  KCl packet 40 mEq per tube x 1  At 1800: Reduce TPN to 40 mL/hr at 1800 - tolerating > 50% of nutritional goals per tube Electrolytes in TPN: incr Na, K, Mg (relative doubling but effectively unchanged considering rate reduced 50%) Na 100 mEq/L K 50 mEq/L Ca 75mq/L Mg 10 mEq/L Phos 15 mmol/L Cl:Ac Max Ac MVI per tube Discontinue scheduled SSI (RN can still check CBG if nutrition stopped abruptly or hypoglycemia suspected) MIVF per MD Monitor TPN labs on Mon/Thurs Bmet in AM  DReuel Boom PharmD, BCPS [phone]/13/2023, 7:21 AM

## 2021-11-27 ENCOUNTER — Inpatient Hospital Stay (HOSPITAL_COMMUNITY): Payer: Medicare Other

## 2021-11-27 DIAGNOSIS — I5030 Unspecified diastolic (congestive) heart failure: Secondary | ICD-10-CM

## 2021-11-27 DIAGNOSIS — R6521 Severe sepsis with septic shock: Secondary | ICD-10-CM | POA: Diagnosis not present

## 2021-11-27 DIAGNOSIS — N390 Urinary tract infection, site not specified: Secondary | ICD-10-CM | POA: Diagnosis not present

## 2021-11-27 DIAGNOSIS — A419 Sepsis, unspecified organism: Secondary | ICD-10-CM | POA: Diagnosis not present

## 2021-11-27 DIAGNOSIS — G9341 Metabolic encephalopathy: Secondary | ICD-10-CM | POA: Diagnosis not present

## 2021-11-27 LAB — GLUCOSE, CAPILLARY
Glucose-Capillary: 103 mg/dL — ABNORMAL HIGH (ref 70–99)
Glucose-Capillary: 117 mg/dL — ABNORMAL HIGH (ref 70–99)
Glucose-Capillary: 136 mg/dL — ABNORMAL HIGH (ref 70–99)
Glucose-Capillary: 162 mg/dL — ABNORMAL HIGH (ref 70–99)
Glucose-Capillary: 176 mg/dL — ABNORMAL HIGH (ref 70–99)
Glucose-Capillary: 97 mg/dL (ref 70–99)

## 2021-11-27 LAB — BASIC METABOLIC PANEL
Anion gap: 7 (ref 5–15)
BUN: 66 mg/dL — ABNORMAL HIGH (ref 8–23)
CO2: 22 mmol/L (ref 22–32)
Calcium: 8 mg/dL — ABNORMAL LOW (ref 8.9–10.3)
Chloride: 112 mmol/L — ABNORMAL HIGH (ref 98–111)
Creatinine, Ser: 1.44 mg/dL — ABNORMAL HIGH (ref 0.61–1.24)
GFR, Estimated: 47 mL/min — ABNORMAL LOW (ref >60)
Glucose, Bld: 146 mg/dL — ABNORMAL HIGH (ref 70–99)
Potassium: 4.6 mmol/L (ref 3.5–5.1)
Sodium: 141 mmol/L (ref 135–145)

## 2021-11-27 LAB — CBC
HCT: 25 % — ABNORMAL LOW (ref 39.0–52.0)
Hemoglobin: 7.8 g/dL — ABNORMAL LOW (ref 13.0–17.0)
MCH: 31.5 pg (ref 26.0–34.0)
MCHC: 31.2 g/dL (ref 30.0–36.0)
MCV: 100.8 fL — ABNORMAL HIGH (ref 80.0–100.0)
Platelets: 245 10*3/uL (ref 150–400)
RBC: 2.48 MIL/uL — ABNORMAL LOW (ref 4.22–5.81)
RDW: 19.2 % — ABNORMAL HIGH (ref 11.5–15.5)
WBC: 10.4 10*3/uL (ref 4.0–10.5)
nRBC: 0 % (ref 0.0–0.2)

## 2021-11-27 LAB — ECHOCARDIOGRAM COMPLETE
AR max vel: 2.63 cm2
AV Area VTI: 2.37 cm2
AV Area mean vel: 2.5 cm2
AV Mean grad: 5 mmHg
AV Peak grad: 9.2 mmHg
Ao pk vel: 1.52 m/s
Area-P 1/2: 3.16 cm2
Height: 69 in
S' Lateral: 2.7 cm
Weight: 2998.26 oz

## 2021-11-27 LAB — TROPONIN I (HIGH SENSITIVITY): Troponin I (High Sensitivity): 6 ng/L (ref <18)

## 2021-11-27 LAB — HEMOGLOBIN A1C
Hgb A1c MFr Bld: 5.7 % — ABNORMAL HIGH (ref 4.8–5.6)
Mean Plasma Glucose: 116.89 mg/dL

## 2021-11-27 LAB — MAGNESIUM: Magnesium: 2.1 mg/dL (ref 1.7–2.4)

## 2021-11-27 MED ORDER — INSULIN ASPART 100 UNIT/ML IJ SOLN
0.0000 [IU] | INTRAMUSCULAR | Status: DC
  Administered 2021-11-27 – 2021-11-28 (×4): 1 [IU] via SUBCUTANEOUS
  Administered 2021-11-28: 2 [IU] via SUBCUTANEOUS
  Administered 2021-11-29 – 2021-12-01 (×7): 1 [IU] via SUBCUTANEOUS

## 2021-11-27 MED ORDER — PHENYLEPHRINE HCL-NACL 20-0.9 MG/250ML-% IV SOLN
0.0000 ug/min | INTRAVENOUS | Status: DC
  Administered 2021-11-27: 20 ug/min via INTRAVENOUS
  Administered 2021-11-28: 40 ug/min via INTRAVENOUS
  Filled 2021-11-27 (×2): qty 250

## 2021-11-27 MED ORDER — ENOXAPARIN SODIUM 40 MG/0.4ML IJ SOSY
40.0000 mg | PREFILLED_SYRINGE | Freq: Every day | INTRAMUSCULAR | Status: DC
  Administered 2021-11-27 – 2021-12-06 (×10): 40 mg via SUBCUTANEOUS
  Filled 2021-11-27 (×10): qty 0.4

## 2021-11-27 MED ORDER — LACTATED RINGERS IV BOLUS
500.0000 mL | Freq: Once | INTRAVENOUS | Status: AC
  Administered 2021-11-27: 1000 mL via INTRAVENOUS

## 2021-11-27 NOTE — Progress Notes (Signed)
Nurse noted that patient his blood pressure and heart rate. Patient's heart rate went from NSR to brady in the low 40's. Nurse started Neo which was inline. Elink was called and the primary nurse spoke with Jenny Reichmann, RN for guidance on patient.  No new orders at this time. Will continue to monitor

## 2021-11-27 NOTE — Progress Notes (Signed)
Echocardiogram 2D Echocardiogram has been performed.  Arlyss Gandy 11/27/2021, 2:14 PM

## 2021-11-27 NOTE — Progress Notes (Addendum)
NAMEJayvier Dudley, MRN:  [id number], DOB:  04-10-1933, LOS: 24 ADMISSION DATE:  11/03/2021 CONSULTATION DATE:  11/23/2021 REFERRING MD:  Verlon Au - [facility] CHIEF COMPLAINT:  Symptomatic bradycardia  BRIEF   88 yo male presented to [facility] ER on 12/21 with fever from recurrent UTI.  He has hx of metastatic prostate cancer, urethral erosion, and bilateral hydronephrosis.  He was treated with broad spectrum ABx and pressors.  Had clinical improvement and transferred to hospitalist service on 12/25.  He subsequently had cystoscopy with b/l ureteral stent exchange and suprapubic catheter placement on 12/29.  He had dysphagia with poor caloric intake and started on TPN 1/07.  He had PEG placement on 1/10.  Post procedure he developed bradycardia with hypotension and altered mental status.  He was intubated, started on pressors and IV fluids, and transferred to ICU.  PCCM resumed primary management in ICU.  Pertinent Medical History:  GERD, Anemia, Anxiety, Depression, CKD 3b, Dementia, Metastatic prostate cancer, SVT, CHF, Sepsis, Failure to thrive  Significant Hospital Events: Including procedures, antibiotic start and stop dates in addition to other pertinent events   12/21 Admit with urosepsis, septic shock  12/25 to [facility]  12/29 Cystoscopy with bilateral ureteral stent exchange and suprapubic catheter placement  1/03 [facility] consulted for PEG due to persistent dysphagia and poor intake  1/07 PICC placed for TPN  1/10 PEG placement per [facility]CS.  Post placement on floor, pt developed hypotension and bradycardia to 30's requiring BVM.  Intubated by anesthesia.  LR bolus + atropine. Did not require CPR.  1/11 Extubated  1/12 - Breathing okay.  Denies chest, abdominal pain.  Remains on pressors. 1/13 - levophed 31mcg. Goal sbp > 90 per RN. Has PEG tube now and suprapubic. RN reports baseline failure to thrive Goalls of care with DPOA brother - full code. Declined palliative care consult  Interim History /  Subjective:   1/14 -continues on Levophed at 2 mcg.  Difficult to wean down further.  Diastolic appears very low.Marland Kitchen  He is on room air.  Afebrile with stable white count.  On antibiotics.  Culture negative so far.  RN concerned about tachycardia and bradycardia including nonsustained V. tach.  EKG showed sinus bradycardia with PACs.  Patient also asking for water.  He is on PEG tube feeds and also TPN.  Objective:  Blood pressure (!) 109/33, pulse (!) 52, temperature 97.8 F (36.6 C), temperature source Oral, resp. rate 17, height 5\' 9"  (1.753 m), weight 85 kg, SpO2 97 %.        Intake/Output Summary (Last 24 hours) at 11/27/2021 0918 Last data filed at 11/27/2021 0700 Gross per 24 hour  Intake 2589.62 ml  Output 2925 ml  Net -335.38 ml   Filed Weights   11/25/21 0500 11/26/21 0404 11/27/21 0458  Weight: 84 kg 84 kg 85 kg   Physical Exam: Extremely deconditioned frail cachectic male.  Lying in the bed.  He is pleasantly confused.  He thinks he is in track room in [place], [place].  He has been oriented in the past even today that he is in [place] in the hospital.  Clear to auscultation.  Abdomen is soft has a PEG tube with tube feeds running.  He moves all 4 extremities.  He is slow to respond.  Very deconditioned.  Normal heart sounds.  Currently on room air.   Resolved Hospital Problem List:    Assessment & Plan:   Acute hypoxic respiratory failure likely from sedation during  procedure. - successfully extubated 1/11  11/27/2021: On room air  Plan - Monitor  Acute encephalopathy  11/27/2021 - mild present  Plan  - monitor    Aspiration pneumonitis .   11/27/2021 - afebrile remains at risk for aspiration and is on PEG tube feeds  Plan - day 4/5 of unasyn - f/u CXR intermittently   Intermittent bradycardia and hypotension. - d/c'ed lopressor 1/11 - add midodrone 1/12 -Added hydrocortisone 11/26/2021  11/27/2021 - still on levophed 2 mcg.  Has extremely  low diastolic  Plan -263 cc fluid bolus - sbp > 95 and MAP > 55 - change levophed to neo to ward off any arrhythmia instability - Get echocardiogram - check troponin     Dysphagia. Severe protein calorie malnutrition. S/p PEG  11/27/2021 - TPN and TF  plan - transition off TPN once his tube feeding approaches goal amount -checking with pharmacist 11/27/2021 - ok for RN to try sips of water at patient request   Metastatic prostate cancer. Chronic urine retention s/p suprapubic catheter - followed by urology Acute on chronic renal failure [October 2022 creatinine 2.2 mg percent, admission creatinine 11/03/2021 6.08 mg percent_ prior to & Present on Admit   11/27/2021 -much better creatinine compared to admission but recent few days?  Trending up  Plan -Monitor with fluid bolus    Macrocytic anemia.  11/27/2021 - Hgb 7.8  Plan - f/u CBC - transfuse for Hb < 7   Hyperglycemia. - SSI   Failure To Thrive - Prior to & Present on Admit SNF resident - Prior to & Present on Admit Severe Protein Calorie Malnutrition - alb 2s - Prior to & Present on Admit Pressure injuries. - stage 3 sacrum, POA - Rt heel deep tissue injury, not present on admission - stage 2 vertebral column, not present on admission - stage 1 toes, not present on admission - wound care  Best Practice: (right click and "Reselect all SmartList Selections" daily)  Diet/type: tube feeds, TPN DVT prophylaxis: SQ heparin -> chagne to SQ lovenox in setting of cancer GI prophylaxis: protonix Lines: Rt double lumen PICC 1/07 >>  Foley:  Suprapubic catheter 12/29 >>  Code Status:  full code Last date of multidisciplinary goals of care discussion: bother updated 1/11 - 1/13.     - Goals of care 11/26/21 : Brother [place] lives in [place], [place]. He ias an Forensic psychologist . Says he does living will and is sensitive to the issue. In Oct 2022 he said he had extensive discussions ith patient who has emphasized desire to live as  long as possible (quantity > quality). So, right now full code. He said he will make decisions on comfor care based on his asessment of patient talking to medical doctors. He does NOT want palliative care involved. He understands palliative care is not hospice and not terminal care but feels that the goals here are clear and so does not want palliative care involved Expressed we will respect that      [place]   The patient Anthony Dudley is critically ill with multiple organ systems failure and requires high complexity decision making for assessment and support, frequent evaluation and titration of therapies, application of advanced monitoring technologies and extensive interpretation of multiple databases.   Critical Care Time devoted to patient care services described in this note is  35  Minutes. This time reflects time of care of this signee Dr Brand Males. This critical care time does not reflect procedure  time, or teaching time or supervisory time of PA/NP/Med student/Med Resident etc but could involve care discussion time     Dr. Brand Males, M.D., [facility]C.P Pulmonary and Critical Care Medicine Staff Physician [facility] Pager: [phone], If no answer or between  15:00h - 7:00h: call [phone]  11/27/2021 9:43 AM     LABS    PULMONARY Recent Labs  Lab 11/23/21 1944 11/24/21 0620  PHART 7.269* 7.455*  PCO2ART 46.2 31.3*  PO2ART 314* 115*  HCO3 20.7 21.7  O2SAT 99.5 99.2    CBC Recent Labs  Lab 11/25/21 0420 11/26/21 0426 11/27/21 0000  HGB 7.4* ACCURACY OF RESULTS QUESTIONABLE. RECOMMEND RECOLLECT TO VERIFY. 7.8*  HCT 23.6* ACCURACY OF RESULTS QUESTIONABLE. RECOMMEND RECOLLECT TO VERIFY. 25.0*  WBC 9.9 ACCURACY OF RESULTS QUESTIONABLE. RECOMMEND RECOLLECT TO VERIFY. 10.4  PLT 233 ACCURACY OF RESULTS QUESTIONABLE. RECOMMEND RECOLLECT TO VERIFY. 245    COAGULATION No results for  input(s): INR in the last 168 hours.  CARDIAC  No results for input(s): TROPONINI in the last 168 hours. No results for input(s): PROBNP in the last 168 hours.   CHEMISTRY Recent Labs  Lab 11/23/21 0312 11/23/21 1940 11/24/21 0400 11/25/21 0420 11/26/21 0426 11/26/21 0600 11/27/21 0500  NA 138 137 136 138  --  134* 141  K 4.3 5.3* 5.1 4.4  --  3.4* 4.6  CL 115* 108 106 111  --  105 112*  CO2 20* 23 21* 23  --  21* 22  GLUCOSE 115* 313* 197* 148*  --  123* 146*  BUN 35* 39* 43* 50*  --  59* 66*  CREATININE 1.29* 1.38* 1.28* 1.39*  --  1.37* 1.44*  CALCIUM 7.4* 7.7* 7.7* 7.3*  --  7.5* 8.0*  MG 2.0  --  1.9 1.9 ACCURACY OF RESULTS QUESTIONABLE. RECOMMEND RECOLLECT TO VERIFY. 1.8 2.1  PHOS 3.4  --  3.4 3.5 ACCURACY OF RESULTS QUESTIONABLE. RECOMMEND RECOLLECT TO VERIFY. 3.6  --    Estimated Creatinine Clearance: 39.8 mL/min (A) (by C-G formula based on SCr of 1.44 mg/dL (H)).   LIVER Recent Labs  Lab 11/22/21 0348 11/23/21 0312 11/23/21 1940 11/25/21 0420  AST 16 18 20 27   ALT 12 13 14 15   ALKPHOS 312* 333* 344* 329*  BILITOT 0.3 0.3 0.3 0.3  PROT 5.3* 5.7* 5.8* 6.2*  ALBUMIN 1.8* 1.9* 1.9* 2.1*     INFECTIOUS Recent Labs  Lab 11/23/21 1941  LATICACIDVEN 2.7*   1.2     ENDOCRINE CBG (last 3)  Recent Labs    11/26/21 2314 11/27/21 0359 11/27/21 0816  GLUCAP 140* 162* 176*         IMAGING x48h  - image(s) personally visualized  -   highlighted in bold No results found.

## 2021-11-27 NOTE — Progress Notes (Signed)
Per elink guidance, nurse is go up on neo gtt. Until blood pressure comes up. Will continue to monitor.

## 2021-11-27 NOTE — Consult Note (Signed)
[place] Nurse wound follow up Consulted for frequency on collagenase dressing change.  Frequency of daily is now indicated/order, provided by my associate, is amended.  [place] nursing team will follow weekly, and will remain available to this patient, the nursing and medical teams.  Thanks, Maudie Flakes, MSN, RN, [place], Arther Abbott  Pager# [phone]

## 2021-11-28 DIAGNOSIS — R6521 Severe sepsis with septic shock: Secondary | ICD-10-CM | POA: Diagnosis not present

## 2021-11-28 DIAGNOSIS — G9341 Metabolic encephalopathy: Secondary | ICD-10-CM | POA: Diagnosis not present

## 2021-11-28 DIAGNOSIS — N390 Urinary tract infection, site not specified: Secondary | ICD-10-CM | POA: Diagnosis not present

## 2021-11-28 DIAGNOSIS — A419 Sepsis, unspecified organism: Secondary | ICD-10-CM | POA: Diagnosis not present

## 2021-11-28 LAB — GLUCOSE, CAPILLARY
Glucose-Capillary: 136 mg/dL — ABNORMAL HIGH (ref 70–99)
Glucose-Capillary: 152 mg/dL — ABNORMAL HIGH (ref 70–99)
Glucose-Capillary: 107 mg/dL — ABNORMAL HIGH (ref 70–99)
Glucose-Capillary: 145 mg/dL — ABNORMAL HIGH (ref 70–99)
Glucose-Capillary: 148 mg/dL — ABNORMAL HIGH (ref 70–99)

## 2021-11-28 LAB — CBC
HCT: 23.2 % — ABNORMAL LOW (ref 39.0–52.0)
HCT: 23.4 % — ABNORMAL LOW (ref 39.0–52.0)
Hemoglobin: 7.1 g/dL — ABNORMAL LOW (ref 13.0–17.0)
Hemoglobin: 7.2 g/dL — ABNORMAL LOW (ref 13.0–17.0)
MCH: 31.4 pg (ref 26.0–34.0)
MCH: 31.9 pg (ref 26.0–34.0)
MCHC: 30.3 g/dL (ref 30.0–36.0)
MCHC: 31 g/dL (ref 30.0–36.0)
MCV: 102.7 fL — ABNORMAL HIGH (ref 80.0–100.0)
MCV: 103.5 fL — ABNORMAL HIGH (ref 80.0–100.0)
Platelets: 253 10*3/uL (ref 150–400)
Platelets: 259 10*3/uL (ref 150–400)
RBC: 2.26 MIL/uL — ABNORMAL LOW (ref 4.22–5.81)
RBC: 2.26 MIL/uL — ABNORMAL LOW (ref 4.22–5.81)
RDW: 19.6 % — ABNORMAL HIGH (ref 11.5–15.5)
RDW: 19.9 % — ABNORMAL HIGH (ref 11.5–15.5)
WBC: 11 10*3/uL — ABNORMAL HIGH (ref 4.0–10.5)
WBC: 12.4 10*3/uL — ABNORMAL HIGH (ref 4.0–10.5)
nRBC: 0 % (ref 0.0–0.2)
nRBC: 0 % (ref 0.0–0.2)

## 2021-11-28 LAB — COMPREHENSIVE METABOLIC PANEL
ALT: 95 U/L — ABNORMAL HIGH (ref 0–44)
AST: 130 U/L — ABNORMAL HIGH (ref 15–41)
Albumin: 2.2 g/dL — ABNORMAL LOW (ref 3.5–5.0)
Alkaline Phosphatase: 327 U/L — ABNORMAL HIGH (ref 38–126)
Anion gap: 6 (ref 5–15)
BUN: 64 mg/dL — ABNORMAL HIGH (ref 8–23)
CO2: 22 mmol/L (ref 22–32)
Calcium: 7.7 mg/dL — ABNORMAL LOW (ref 8.9–10.3)
Chloride: 112 mmol/L — ABNORMAL HIGH (ref 98–111)
Creatinine, Ser: 1.51 mg/dL — ABNORMAL HIGH (ref 0.61–1.24)
GFR, Estimated: 44 mL/min — ABNORMAL LOW (ref >60)
Glucose, Bld: 167 mg/dL — ABNORMAL HIGH (ref 70–99)
Potassium: 4.1 mmol/L (ref 3.5–5.1)
Sodium: 140 mmol/L (ref 135–145)
Total Bilirubin: 0.4 mg/dL (ref 0.3–1.2)
Total Protein: 5.9 g/dL — ABNORMAL LOW (ref 6.5–8.1)

## 2021-11-28 LAB — BLOOD GAS, ARTERIAL
Acid-base deficit: 2.3 mmol/L — ABNORMAL HIGH (ref 0.0–2.0)
Bicarbonate: 20.9 mmol/L (ref 20.0–28.0)
Drawn by: 29503
FIO2: 21
O2 Saturation: 95.7 %
Patient temperature: 98.6
pCO2 arterial: 31.3 mmHg — ABNORMAL LOW (ref 32.0–48.0)
pH, Arterial: 7.44 (ref 7.350–7.450)
pO2, Arterial: 80.7 mmHg — ABNORMAL LOW (ref 83.0–108.0)

## 2021-11-28 LAB — TROPONIN I (HIGH SENSITIVITY)
Troponin I (High Sensitivity): 7 ng/L (ref <18)
Troponin I (High Sensitivity): 6 ng/L (ref <18)

## 2021-11-28 LAB — MAGNESIUM: Magnesium: 2 mg/dL (ref 1.7–2.4)

## 2021-11-28 LAB — TYPE AND SCREEN
ABO/RH(D): O POS
Antibody Screen: NEGATIVE

## 2021-11-28 LAB — LACTIC ACID, PLASMA: Lactic Acid, Venous: 0.9 mmol/L (ref 0.5–1.9)

## 2021-11-28 LAB — PHOSPHORUS: Phosphorus: 4 mg/dL (ref 2.5–4.6)

## 2021-11-28 MED ORDER — NOREPINEPHRINE 4 MG/250ML-% IV SOLN: 0.0000 ug/min | INTRAVENOUS | Status: DC

## 2021-11-28 MED ORDER — DOPAMINE-DEXTROSE 3.2-5 MG/ML-% IV SOLN
2.5000 ug/kg/min | INTRAVENOUS | Status: DC
  Administered 2021-11-28: 2.5 ug/kg/min via INTRAVENOUS
  Filled 2021-11-28: qty 250

## 2021-11-28 NOTE — Progress Notes (Addendum)
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 ER on 12/21 with fever from recurrent UTI.  He has hx of metastatic prostate cancer, urethral erosion, and bilateral hydronephrosis.  He was treated with broad spectrum ABx and pressors.  Had clinical improvement and transferred to hospitalist service on 12/25.  He subsequently had cystoscopy with b/l ureteral stent exchange and suprapubic catheter placement on 12/29.  He had dysphagia with poor caloric intake and started on TPN 1/07.  He had PEG placement on 1/10.  Post procedure he developed bradycardia with hypotension and altered mental status.  He was intubated, started on pressors and IV fluids, and transferred to ICU.  PCCM resumed primary management in ICU.  Pertinent Medical History:  GERD, Anemia, Anxiety, Depression, CKD 3b, Dementia, Metastatic prostate cancer, SVT, CHF, Sepsis, Failure to thrive  Significant Hospital Events: Including procedures, antibiotic start and stop dates in addition to other pertinent events   12/21 Admit with urosepsis, septic shock  12/25 to [facility]  12/29 Cystoscopy with bilateral ureteral stent exchange and suprapubic catheter placement  1/03 CCS consulted for PEG due to persistent dysphagia and poor intake  1/07 PICC placed for TPN  1/10 PEG placement per CCS.  Post placement on floor, pt developed hypotension and bradycardia to 30's requiring BVM.  Intubated by anesthesia.  LR bolus + atropine. Did not require CPR.  1/11 Extubated  1/12 - Breathing okay.  Denies chest, abdominal pain.  Remains on pressors. 1/13 - levophed 57mcg. Goal sbp > 90 per RN. Has PEG tube now and suprapubic. RN reports baseline  failure to thrive Goalls of care with DPOA brother - full code. Declined palliative care consult 1/14 -continues on Levophed at 2 mcg.  Difficult to wean down further.  Diastolic appears very low.Marland Kitchen  He is on room air.  Afebrile with stable white count.  On antibiotics.  Culture negative so far.  RN concerned about tachycardia and bradycardia including nonsustained V. tach.  EKG showed sinus bradycardia with PACs.  Patient also asking for water.  He is on PEG tube feeds and also TPN.  Interim History / Subjective:   11/15 -Levophed changed to Neo-Synephrine yesterday and patient having persistent bradycardia sometimes dipping into heart rate 30s.  Sinus on EKG.  Currently on Neo-Synephrine, midodrine and hydrocortisone.  RN thinks he is worse today with increased somnolence.  Today is his birthday.  Echo yesterday is normal.  Stop TPN.  Continue tube feeds  Objective:  Blood pressure (!) 113/37, pulse (!) 36, temperature 98 F (36.7 C), temperature source Axillary, resp. rate 19, height 5\' 9"  (1.753 m), weight 85 kg, SpO2 100 %.        Intake/Output Summary (Last 24 hours) at 11/28/2021 1211 Last data filed at 11/28/2021 0836 Gross per 24 hour  Intake 1277.09 ml  Output 1400 ml  Net -122.91 ml   Filed Weights   11/25/21 0500 11/26/21 0404 11/27/21 0458  Weight: 84 kg 84 kg 85 kg   Physical Exam: Extremely deconditioned frail cachectic diffuse muscular wasting male.  Earlier nurse was concerned he was drowsy but to me he is awake and he is trying to watch TV although he is  pleasantly confused.  He did acknowledge by saying thank you for his birthday.  He moves his 4 extremities slowly.  Clear to auscultation.  Abdomen soft.  Has a PEG tube..  On tube feeds.  Off TPN.   Resolved Hospital Problem List:    Assessment & Plan:   Acute hypoxic respiratory failure likely from sedation during procedure. - successfully extubated 1/11  11/28/2021: On room air  Plan - Monitor  Acute  encephalopathy  11/28/2021 - mild present and ongoing without change but nursing concerned it is worse  Plan  - monitor -Check ABG    Aspiration pneumonitis with severe dysphagia   11/28/2021 - afebrile remains at risk for aspiration and is on PEG tube feeds  Plan - day 5/5 of unasyn - f/u CXR intermittently -If mental status improves can consider speech eval   Intermittent bradycardia and hypotension (seen by Dr Virgina Jock inpatnet Oct 2022 for tachycardia) - d/c'ed lopressor 1/11 - add midodrone 1/12 -Added hydrocortisone 11/26/2021 -Normal echo 11/27/2021 -Normal troponin 11/28/2021  11/28/2021 - .  Has extremely low diastolic.  More bradycardic after Neo-Synephrine. HR 30s. EKG sinus. Possibl sick sinu  Plan - sbp > 95 and MAP > 55 -Stop neo and add back Levophed  - Might need cardiology consult - dw. Dr Phineas Inches - rrecommendds dopamine if levophed not helpful and contact Dr Einar Gip (pairent of [facility] per her) for consult 11/1721  - start dopamine (d/w Dr Einar Gip) - ekg 1/16      Dysphagia. Severe protein calorie malnutrition. S/p PEG.  Off TPN on 11/27/2021  11/28/2021 -ongoing tube feeds  plan - ok for RN to try sips of water at patient request for comfort -Continue tube feeds   Metastatic prostate cancer. Chronic urine retention s/p suprapubic catheter - followed by urology Acute on chronic renal failure [October 2022 creatinine 2.2 mg percent, admission creatinine 11/03/2021 6.08 mg percent_ prior to & Present on Admit   11/28/2021 -much better creatinine compared to admission but recent few days?  Trending up  Plan -Recheck creatinine    Macrocytic anemia with anemia critical illness  11/28/2021 - Hgb 7.2  Plan - f/u CBC -Recheck CBC - transfuse for Hb < 7   Hyperglycemia. - SSI   Failure To Thrive - Prior to & Present on Admit SNF resident - Prior to & Present on Admit Severe Protein Calorie Malnutrition - alb 2s - Prior to & Present on  Admit Pressure injuries. - stage 3 sacrum, POA - Rt heel deep tissue injury, not present on admission - stage 2 vertebral column, not present on admission - stage 1 toes, not present on admission - wound care  Best Practice: (right click and "Reselect all SmartList Selections" daily)  Diet/type: tube feeds, TPN DVT prophylaxis: SQ heparin -> chagne to SQ lovenox in setting of cancer GI prophylaxis: protonix Lines: Rt double lumen PICC 1/07 >>  Foley:  Suprapubic catheter 12/29 >>  Code Status:  full code Last date of multidisciplinary goals of care discussion: bother updated 1/11 - 1/13.      - Goals of care 11/26/21 : Brother Weleetka lives in [place], [place]. He ias an Forensic psychologist . Says he does living will and is sensitive to the issue. In Oct 2022 he said he had extensive discussions ith patient who has emphasized desire to live as long as possible (quantity > quality). So, right now full code. He said he will make decisions on comfor care based on his asessment of  patient talking to medical doctors. He does NOT want palliative care involved. He understands palliative care is not hospice and not terminal care but feels that the goals here are clear and so does not want palliative care involved Expressed we will respect that   -11/28/2021: Called brother Ahyan Kreeger on [phone] -> wished him happy birthday for patient. Said he, his son and her is likely to visit and sister today.  He reported that patient was a successful men's basketball high school coach.  He was [employer] person.  He states 10 years ago patient deteriorated.  Given prostate and kidney issues this and legally blind patients nursing home.  Patient's parents lived into the 43s and 4s.  Brother did acknowledge that with the ongoing medical issues that he might be approaching end-of-life but at this point patient is still full code.       ATTESTATION & SIGNATURE   The patient Rafal Archuleta is critically ill with multiple  organ systems failure and requires high complexity decision making for assessment and support, frequent evaluation and titration of therapies, application of advanced monitoring technologies and extensive interpretation of multiple databases.   Critical Care Time devoted to patient care services described in this note is  40  Minutes. This time reflects time of care of this signee Dr Brand Males. This critical care time does not reflect procedure time, or teaching time or supervisory time of PA/NP/Med student/Med Resident etc but could involve care discussion time     Dr. Brand Males, M.D., [facility].C.P Pulmonary and Critical Care Medicine Staff Physician [place] [facility] Pager: [phone], If no answer or between  15:00h - 7:00h: call [phone]  11/28/2021 12:26 PM   LABS    PULMONARY Recent Labs  Lab 11/23/21 1944 11/24/21 0620  PHART 7.269* 7.455*  PCO2ART 46.2 31.3*  PO2ART 314* 115*  HCO3 20.7 21.7  O2SAT 99.5 99.2    CBC Recent Labs  Lab 11/26/21 0426 11/27/21 0000 11/28/21 0000  HGB ACCURACY OF RESULTS QUESTIONABLE. RECOMMEND RECOLLECT TO VERIFY. 7.8* 7.2*  HCT ACCURACY OF RESULTS QUESTIONABLE. RECOMMEND RECOLLECT TO VERIFY. 25.0* 23.2*  WBC ACCURACY OF RESULTS QUESTIONABLE. RECOMMEND RECOLLECT TO VERIFY. 10.4 12.4*  PLT ACCURACY OF RESULTS QUESTIONABLE. RECOMMEND RECOLLECT TO VERIFY. 245 259    COAGULATION No results for input(s): INR in the last 168 hours.  CARDIAC  No results for input(s): TROPONINI in the last 168 hours. No results for input(s): PROBNP in the last 168 hours.   CHEMISTRY Recent Labs  Lab 11/23/21 0312 11/23/21 1940 11/24/21 0400 11/25/21 0420 11/26/21 0426 11/26/21 0600 11/27/21 0500  NA 138 137 136 138  --  134* 141  K 4.3 5.3* 5.1 4.4  --  3.4* 4.6  CL 115* 108 106 111  --  105 112*  CO2 20* 23 21* 23  --  21* 22  GLUCOSE 115* 313* 197* 148*  --  123* 146*  BUN 35* 39* 43*  50*  --  59* 66*  CREATININE 1.29* 1.38* 1.28* 1.39*  --  1.37* 1.44*  CALCIUM 7.4* 7.7* 7.7* 7.3*  --  7.5* 8.0*  MG 2.0  --  1.9 1.9 ACCURACY OF RESULTS QUESTIONABLE. RECOMMEND RECOLLECT TO VERIFY. 1.8 2.1  PHOS 3.4  --  3.4 3.5 ACCURACY OF RESULTS QUESTIONABLE. RECOMMEND RECOLLECT TO VERIFY. 3.6  --    Estimated Creatinine Clearance: 39.1 mL/min (A) (by C-G formula based on SCr of  1.44 mg/dL (H)).   LIVER Recent Labs  Lab 11/22/21 0348 11/23/21 0312 11/23/21 1940 11/25/21 0420  AST 16 18 20 27   ALT 12 13 14 15   ALKPHOS 312* 333* 344* 329*  BILITOT 0.3 0.3 0.3 0.3  PROT 5.3* 5.7* 5.8* 6.2*  ALBUMIN 1.8* 1.9* 1.9* 2.1*     INFECTIOUS Recent Labs  Lab 11/23/21 1941  LATICACIDVEN 2.7*   1.2     ENDOCRINE CBG (last 3)  Recent Labs    11/28/21 0419 11/28/21 0755 11/28/21 1150  GLUCAP 136* 152* 107*         IMAGING x48h  - image(s) personally visualized  -   highlighted in bold ECHOCARDIOGRAM COMPLETE  Result Date: 11/27/2021    ECHOCARDIOGRAM REPORT   Patient Name:   [facility] Buschman Date of Exam: 11/27/2021 Medical Rec #:  [id number]      Height:       69.0 in Accession #:    [id number]     Weight:       187.4 lb Date of Birth:  [birth year]      BSA:          2.010 m Patient Age:    77 years       BP:           109/33 mmHg Patient Gender: M              HR:           52 bpm. Exam Location:  Inpatient Procedure: 2D Echo Indications:    CHF  History:        Patient has prior history of Echocardiogram examinations, most                 recent 08/21/2021. CHF; Arrythmias:SVT.  Sonographer:    Arlyss Gandy Referring Phys: [place]  1. Left ventricular ejection fraction, by estimation, is 55 to 60%. The left ventricle has normal function. Left ventricular endocardial border not optimally defined to evaluate regional wall motion but no wall motion abnormalities seen in wall in view.  Left ventricular diastolic parameters are consistent with Grade I  diastolic dysfunction (impaired relaxation).  2. Right ventricular systolic function is normal. The right ventricular size is normal. There is normal pulmonary artery systolic pressure.  3. The mitral valve is grossly normal. No evidence of mitral valve regurgitation. No evidence of mitral stenosis.  4. The aortic valve was not well visualized. Aortic valve regurgitation is not visualized. No aortic stenosis is present. Comparison(s): No significant change from prior study. FINDINGS  Left Ventricle: Left ventricular ejection fraction, by estimation, is 55 to 60%. The left ventricle has normal function. Left ventricular endocardial border not optimally defined to evaluate regional wall motion. The left ventricular internal cavity size was small. There is no left ventricular hypertrophy. Left ventricular diastolic parameters are consistent with Grade I diastolic dysfunction (impaired relaxation). Right Ventricle: The right ventricular size is normal. No increase in right ventricular wall thickness. Right ventricular systolic function is normal. There is normal pulmonary artery systolic pressure. The tricuspid regurgitant velocity is 2.34 m/s, and  with an assumed right atrial pressure of 3 mmHg, the estimated right ventricular systolic pressure is 88.4 mmHg. Left Atrium: Left atrial size was normal in size. Right Atrium: Right atrial size was normal in size. Pericardium: There is no evidence of pericardial effusion. Mitral Valve: The mitral valve is grossly normal. No evidence of mitral valve regurgitation. No evidence of mitral valve stenosis.  Tricuspid Valve: The tricuspid valve is normal in structure. Tricuspid valve regurgitation is mild . No evidence of tricuspid stenosis. Aortic Valve: The aortic valve was not well visualized. Aortic valve regurgitation is not visualized. No aortic stenosis is present. Aortic valve mean gradient measures 5.0 mmHg. Aortic valve peak gradient measures 9.2 mmHg. Aortic valve area,  by VTI measures 2.37 cm. Pulmonic Valve: The pulmonic valve was not well visualized. Pulmonic valve regurgitation is not visualized. Aorta: The aortic root and ascending aorta are structurally normal, with no evidence of dilitation. IAS/Shunts: No atrial level shunt detected by color flow Doppler.  LEFT VENTRICLE PLAX 2D LVIDd:         3.90 cm   Diastology LVIDs:         2.70 cm   LV e' medial:    7.51 cm/s LV PW:         1.00 cm   LV E/e' medial:  10.7 LV IVS:        1.00 cm   LV e' lateral:   9.03 cm/s LVOT diam:     2.20 cm   LV E/e' lateral: 8.9 LV SV:         80 LV SV Index:   40 LVOT Area:     3.80 cm  LEFT ATRIUM             Index LA diam:        4.00 cm 1.99 cm/m LA Vol (A2C):   46.7 ml 23.24 ml/m LA Vol (A4C):   34.2 ml 17.02 ml/m LA Biplane Vol: 42.2 ml 21.00 ml/m  AORTIC VALVE AV Area (Vmax):    2.63 cm AV Area (Vmean):   2.50 cm AV Area (VTI):     2.37 cm AV Vmax:           151.50 cm/s AV Vmean:          101.050 cm/s AV VTI:            0.338 m AV Peak Grad:      9.2 mmHg AV Mean Grad:      5.0 mmHg LVOT Vmax:         105.00 cm/s LVOT Vmean:        66.400 cm/s LVOT VTI:          0.210 m LVOT/AV VTI ratio: 0.62  AORTA Ao Root diam: 3.50 cm Ao Asc diam:  2.90 cm MITRAL VALVE               TRICUSPID VALVE MV Area (PHT): 3.16 cm    TR Peak grad:   21.9 mmHg MV Decel Time: 240 msec    TR Vmax:        234.00 cm/s MV E velocity: 80.10 cm/s MV A velocity: 70.30 cm/s  SHUNTS MV E/A ratio:  1.14        Systemic VTI:  0.21 m                            Systemic Diam: 2.20 cm Rudean Haskell MD Electronically signed by Rudean Haskell MD Signature Date/Time: 11/27/2021/2:51:03 PM    Final

## 2021-11-28 NOTE — Progress Notes (Signed)
° ° °  Cardiology advised dopamine and I placed him on extremely low-dose of dopamine 2.5 mcg.  But after this he developed A. fib RVR.  Levophed was stopped but then he became more hypotensive.  So then dopamine was stopped.  After this he reverted back to sinus bradycardia heart rate currently around 40 according to the bedside nurse on [place] - DC dopamine out of the MAR - Continue Levophed - Very poor prognosis has been communicated by cardiology as well. -Very likely and terminal decline if he does not improve fast -No role for pacemaker  D/w Dr Rachell Cipro    Dr. Brand Males, M.D., F.C.C.P,  Pulmonary and Critical Care Medicine Staff Physician, [place] Director - Interstitial Lung Disease  Program  Pulmonary [place] [facility] [place], [place], [address]  NPI Number:  NPI #[id number]  Pager: [phone], If no answer  -> Check AMION or Try [phone] Telephone (clinical office): [phone] Telephone (research): [phone]  6:27 PM 11/28/2021

## 2021-11-28 NOTE — Progress Notes (Addendum)
I have reviewed the chart, asked to see patient for bradycardia, patient admitted with urosepsis, septic shock, continues to need pressors, frail cachectic, confused, EKG revealing marked sinus bradycardia but no evidence of ischemia, normal LVEF by echocardiogram done yesterday with normal RV systolic pressure and RV function.  Patient's overall clinical status does not permit [place] to place a permanent pacemaker on a temporary pacemaker with ongoing sepsis.  He will not be a candidate for permanent pacemaker implantation.  I have advised CCM to try dopamine for now for pressor support and if he does not respond, would consider comfort care, palliative care.  Please call if questions.  Addendum: I called patient's brother Mr. Dreshaun Stene who holds power of attorney for health, discussed with him for 15 minutes regarding all the medical issues that his brother is presently having, advanced age, generalized wasting, hypotension needing high-dose pressor support with underlying urosepsis, bradycardia does not explain the presentation.  I suspect this is part of generalized multiorgan failure and eventually patient will probably succumb to his underlying issues including developing bradycardia and asystole.  If bradycardia was the only issue that would change his outcomes, placing a temporary pacemaker for now would have been appropriate.  But I do not think that his condition is related to bradycardia alone.  I spent a total of 30 minutes in reviewing the chart, about discussions about critical care and end-of-life issues.   Adrian Prows, MD, [facility] 11/28/2021, 3:44 PM Office: [phone] Fax: [phone] Pager: [phone]

## 2021-11-28 NOTE — H&P (Incomplete)
Nurse reached back out to Mcneil Sober, RN to notify about patient's heart rate. Heat rate reached 34 at the lowest. No new orders at this time. Will continue to monitor.

## 2021-11-29 DIAGNOSIS — R579 Shock, unspecified: Secondary | ICD-10-CM | POA: Diagnosis not present

## 2021-11-29 DIAGNOSIS — E43 Unspecified severe protein-calorie malnutrition: Secondary | ICD-10-CM

## 2021-11-29 DIAGNOSIS — A419 Sepsis, unspecified organism: Secondary | ICD-10-CM | POA: Diagnosis not present

## 2021-11-29 DIAGNOSIS — N170 Acute kidney failure with tubular necrosis: Secondary | ICD-10-CM | POA: Diagnosis not present

## 2021-11-29 LAB — GLUCOSE, CAPILLARY
Glucose-Capillary: 116 mg/dL — ABNORMAL HIGH (ref 70–99)
Glucose-Capillary: 119 mg/dL — ABNORMAL HIGH (ref 70–99)
Glucose-Capillary: 122 mg/dL — ABNORMAL HIGH (ref 70–99)
Glucose-Capillary: 132 mg/dL — ABNORMAL HIGH (ref 70–99)
Glucose-Capillary: 78 mg/dL (ref 70–99)
Glucose-Capillary: 91 mg/dL (ref 70–99)
Glucose-Capillary: 98 mg/dL (ref 70–99)

## 2021-11-29 LAB — COMPREHENSIVE METABOLIC PANEL
ALT: 110 U/L — ABNORMAL HIGH (ref 0–44)
AST: 113 U/L — ABNORMAL HIGH (ref 15–41)
Albumin: 2.2 g/dL — ABNORMAL LOW (ref 3.5–5.0)
Alkaline Phosphatase: 308 U/L — ABNORMAL HIGH (ref 38–126)
Anion gap: 7 (ref 5–15)
BUN: 67 mg/dL — ABNORMAL HIGH (ref 8–23)
CO2: 21 mmol/L — ABNORMAL LOW (ref 22–32)
Calcium: 7.9 mg/dL — ABNORMAL LOW (ref 8.9–10.3)
Chloride: 113 mmol/L — ABNORMAL HIGH (ref 98–111)
Creatinine, Ser: 1.58 mg/dL — ABNORMAL HIGH (ref 0.61–1.24)
GFR, Estimated: 42 mL/min — ABNORMAL LOW (ref >60)
Glucose, Bld: 124 mg/dL — ABNORMAL HIGH (ref 70–99)
Potassium: 4.2 mmol/L (ref 3.5–5.1)
Sodium: 141 mmol/L (ref 135–145)
Total Bilirubin: 0.4 mg/dL (ref 0.3–1.2)
Total Protein: 5.9 g/dL — ABNORMAL LOW (ref 6.5–8.1)

## 2021-11-29 LAB — CBC
HCT: 23.3 % — ABNORMAL LOW (ref 39.0–52.0)
Hemoglobin: 7.3 g/dL — ABNORMAL LOW (ref 13.0–17.0)
MCH: 32.4 pg (ref 26.0–34.0)
MCHC: 31.3 g/dL (ref 30.0–36.0)
MCV: 103.6 fL — ABNORMAL HIGH (ref 80.0–100.0)
Platelets: 247 10*3/uL (ref 150–400)
RBC: 2.25 MIL/uL — ABNORMAL LOW (ref 4.22–5.81)
RDW: 19.9 % — ABNORMAL HIGH (ref 11.5–15.5)
WBC: 12.3 10*3/uL — ABNORMAL HIGH (ref 4.0–10.5)
nRBC: 0.2 % (ref 0.0–0.2)

## 2021-11-29 LAB — MAGNESIUM: Magnesium: 1.8 mg/dL (ref 1.7–2.4)

## 2021-11-29 LAB — PHOSPHORUS: Phosphorus: 3.8 mg/dL (ref 2.5–4.6)

## 2021-11-29 MED ORDER — ADULT MULTIVITAMIN LIQUID CH
15.0000 mL | Freq: Every day | ORAL | Status: DC
  Administered 2021-11-29 – 2021-12-06 (×7): 15 mL
  Filled 2021-11-29 (×7): qty 15

## 2021-11-29 MED ORDER — MAGNESIUM SULFATE 2 GM/50ML IV SOLN
2.0000 g | Freq: Once | INTRAVENOUS | Status: AC
  Administered 2021-11-29: 2 g via INTRAVENOUS
  Filled 2021-11-29: qty 50

## 2021-11-29 MED ORDER — FREE WATER
200.0000 mL | Status: DC
  Administered 2021-11-29 – 2021-12-02 (×16): 200 mL

## 2021-11-29 NOTE — TOC Progression Note (Signed)
Transition of Care [facility]) - Progression Note    Patient Details  Name: Anthony Dudley MRN: [id number] Date of Birth: 1933/10/30  Transition of Care [facility] CM/SW Contact  Ross Ludwig, [place] Phone Number: 11/29/2021, 6:03 PM  Clinical Narrative:     CSW continuing to follow patient's progress throughout discharge planning.  Expected Discharge Plan: Long Term Nursing Home Barriers to Discharge: Continued Medical Work up  Expected Discharge Plan and Services Expected Discharge Plan: [place]                                               Social Determinants of Health (SDOH) Interventions    Readmission Risk Interventions No flowsheet data found.

## 2021-11-29 NOTE — Consult Note (Signed)
[place] Nurse wound follow up Patient receiving care in [facility] ICU 1231. Primary RN present at time of my assessment. Wound type: unstageable to coccyx, DTPI to right heel.  Thoracic back wound healed. Measurement: coccyx wound measures 2.6 cm x 2.4 cm x unknown depth; wound bed is yellow.   Right heel DTPI measures 5 cm x 6 cm and is intact and maroon/purple. Wound bed: Drainage (amount, consistency, odor) none for either Periwound: intact for both Dressing procedure/placement/frequency: Apply iodine from the swabsticks or swab pads from clean utility to right heel discolored area.  Allow to air dry. Then place foot back in Prevalon boot.   Continue the santyl for the coccyx wound, and protective foam dressing to healed thoracic back area.  Val Riles, RN, MSN, CWOCN, CNS-BC, pager [phone]

## 2021-11-29 NOTE — Progress Notes (Signed)
Pharmacy: electrolyte replacement Mag 1.8 Plan: Mag 2 gm IVPB  Eudelia Bunch, Pharm.D 11/29/2021 9:52 AM

## 2021-11-29 NOTE — Progress Notes (Addendum)
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  He has hx of metastatic prostate cancer, urethral erosion, and bilateral hydronephrosis.  He was treated with broad spectrum ABx and pressors.  Had clinical improvement and transferred to hospitalist service on 12/25.  He subsequently had cystoscopy with b/l ureteral stent exchange and suprapubic catheter placement on 12/29.  He had dysphagia with poor caloric intake and started on TPN 1/07.  He had PEG placement on 1/10.  Post procedure he developed bradycardia with hypotension and altered mental status.  He was intubated, started on pressors and IV fluids, and transferred to ICU.  PCCM resumed primary management in ICU.  Pertinent Medical History:  GERD, Anemia, Anxiety, Depression, CKD 3b, Dementia, Metastatic prostate cancer, SVT, CHF, Sepsis, Failure to thrive  Significant Hospital Events: Including procedures, antibiotic start and stop dates in addition to other pertinent events   12/21 Admit with urosepsis, septic shock  12/25 to [facility]  12/29 Cystoscopy with bilateral ureteral stent exchange and suprapubic catheter placement  1/03 CCS consulted for PEG due to persistent dysphagia and poor intake  1/07 PICC placed for TPN  1/10 PEG placement per CCS.  Post placement on floor, pt developed hypotension and bradycardia to 30's requiring BVM.  Intubated by anesthesia.  LR bolus + atropine. Did not require CPR.  1/11 Extubated  1/12 Breathing okay.  Denies chest, abdominal pain.  Remains on pressors. 1/13 Levophed 22mcg. Goal sbp > 90 per RN. Has PEG tube now and suprapubic. RN reports baseline failure to  thrive. Goals of care with DPOA brother - full code. Declined palliative care consult 1/14 Continues on Levophed at 2 mcg.  Difficult to wean down further.  Diastolic appears very low.Marland Kitchen  He is on room air.  Afebrile with stable white count.  On antibiotics.  Culture negative so far.  RN concerned about tachycardia and bradycardia including nonsustained V. tach.  EKG showed sinus bradycardia with PACs.  Patient also asking for water.  He is on PEG tube feeds and also TPN. 11/15 Levophed changed to Neo-Synephrine yesterday and patient having persistent bradycardia sometimes dipping into heart rate 30s.  Sinus on EKG.  Currently on Neo-Synephrine, midodrine and hydrocortisone.  RN thinks he is worse today with increased somnolence.  Today is his birthday.  Echo yesterday is normal.  Stop TPN.  Continue tube feeds  Interim History / Subjective:  On levophed 36mcg at beginning of shift, weaned off by RN Pt denies pain, SOB Afebrile   Objective:  Blood pressure (!) 124/40, pulse 75, temperature (!) 97.4 F (36.3 C), temperature source Axillary, resp. rate 14, height 5\' 9"  (1.753 m), weight 86 kg, SpO2 100 %.        Intake/Output Summary (Last 24 hours) at 11/29/2021 0806 Last data filed at 11/29/2021 0430 Gross per 24 hour  Intake 2194.93 ml  Output 950 ml  Net 1244.93 ml   Filed Weights   11/26/21 0404 11/27/21 0458 11/29/21 0459  Weight: 84 kg 85 kg 86 kg   Physical Exam: General: frail elderly male lying in bed in NAD HEENT: MM pink/moist, temporal wasting, anicteric, MM pink/dry Neuro: Awakens  to voice, speech clear, oriented to self, pleasant in conversation but not oriented to place/time/events, generalized weakness / MAE CV: s1s2 RRR, SB in mid 30's to 60's noted on tele, no m/r/g PULM: non-labored at rest, lungs bilaterally clear  GI: soft, bsx4 active, PEG c/d/I GU: suprapubic catheter in place Extremities / skin: warm/dry, no edema, bilateral great toe erythema. No rashes on  exposed skin.   Resolved Hospital Problem List:    Assessment & Plan:   Acute Hypoxic Respiratory Failure s/p sedation for procedure - resolved.  Extubated 1/11 w/o further respiratory difficulties  -pulmonary hygiene -IS, mobilize as able   Aspiration Pneumonitis Completed 5 days unasyn  -follow fever curve / WBC trend -monitor off abx  -intermittent CXR   -feeding per PEG tube  -will ask SLP to come back and evaluate for feeding   Acute Encephalopathy superimposed on Dementia  -supportive care  -promote sleep / wake cycle  -PT efforts  -limit all sedating medications   Bradycardia, Hypotension  Seen by Dr. Virgina Jock in 10/22 for tachycardia. Not a candidate for PPM due to functional status  / concerns for recent sepsis per Cardiology. ECHO, troponin wnl.  -continue midodrine  -monitor off lopressor -continue hydrocortisone -wean for SBP >90, MAP of 50   Dysphagia Severe protein calorie malnutrition. S/p PEG.   Off TPN on 11/27/2021 -continue TF  -was working with SLP prior to intubation  -SLP eval but doubt he will be able to take in enough nutrition by mouth    Metastatic prostate cancer Chronic Urine retention s/p Suprapubic Catheter Acute on chronic renal failure  October 2022 creatinine 2.2, admission creatinine 11/03/2021 6.08   -follow renal function  -avoid nephrotoxic agents    Macrocytic Anemia with Anemia Critical Illness -trend CBC -transfuse for Hgb <7% -consider transfusion given persistently in 7's, hypotension / on-off pressors   Hyperglycemia. -SSI    Failure To Thrive (POA) SNF Resident - Prior to & Present on Admit Severe Protein Calorie Malnutrition - alb 2s - Prior to & Present on Admit Pressure injuries. -wound care per WOC  -enhance nutrition   Best Practice: (right click and "Reselect all SmartList Selections" daily)  Diet/type: tube feeds  DVT prophylaxis: SQ lovenox in setting of cancer GI prophylaxis: protonix Lines: Rt  double lumen PICC 1/07 >>  Foley:  Suprapubic catheter 12/29 >>  Code Status:  full code Last date of multidisciplinary goals of care discussion: bother updated 1/11 - 1/13. Full code per brother / HCPOA.  See prior discussions.   Critical Care Time: 31 minutes   Noe Gens, MSN, APRN, NP-C, AGACNP-BC [facility] [place] [facility] 11/29/2021, 8:06 AM   Please see [email or web address] for pager details.   From 7A-7P if no response, please call [phone] After hours, please call ELink [phone]

## 2021-11-29 NOTE — Progress Notes (Signed)
Patient refused bath and wound care. Stating; "I want to sleep". Education was provided regarding the importance of wound care for infection prevention.   Patient continued refusing daily cares.   VSS, will continue to monitor.

## 2021-11-29 NOTE — Evaluation (Signed)
Clinical/Bedside Swallow Evaluation Patient Details  Name: Anthony Dudley MRN: [id number] Date of Birth: January 14, 1933  Today's Date: 11/29/2021 Time: SLP Start Time (ACUTE ONLY): 1330 SLP Stop Time (ACUTE ONLY): 1350 SLP Time Calculation (min) (ACUTE ONLY): 20 min  Past Medical History:  Past Medical History:  Diagnosis Date   Adult failure to thrive    Anemia    Anxiety    Cancer (HCC)    metastatic prostate cancer   CHF (congestive heart failure) (HCC)    Chronic kidney disease    stage 3   Dementia ([place])    Depression    Dysphagia    Foley catheter in place    GERD (gastroesophageal reflux disease)    Hematuria    History of blood transfusion 08/20/2021   Malnutrition ([place])    Obstructive and reflux uropathy    Physical debility    Severe sepsis with septic shock (CODE) (HCC)    hx of   SVT (supraventricular tachycardia) (HCC)    hx of   Urethral obstruction    bilateral   Urinary tract infection    hx of   Past Surgical History:  Past Surgical History:  Procedure Laterality Date   CYSTOSCOPY W/ URETERAL STENT PLACEMENT Bilateral 11/11/2021   Procedure: CYSTOSCOPY WITH STENT  CHANGE/ SUPRO PUBIC TUBE PLACEMENT;  Surgeon: Raynelle Bring, MD;  Location: [facility];  Service: Urology;  Laterality: Bilateral;   CYSTOSCOPY WITH RETROGRADE PYELOGRAM, URETEROSCOPY AND STENT PLACEMENT Bilateral 08/18/2021   Procedure: 1.  Cystoscopy 2.  Fulguration of bladder;  Surgeon: Raynelle Bring, MD;  Location: [place];  Service: Urology;  Laterality: Bilateral;   INSERTION OF SUPRAPUBIC CATHETER N/A 11/11/2021   Procedure: INSERTION OF SUPRAPUBIC CATHETER;  Surgeon: Raynelle Bring, MD;  Location: [facility];  Service: Urology;  Laterality: N/A;   IR NEPHRO TUBE REMOV/FL  09/06/2021   IR NEPHRO TUBE REMOV/FL  09/06/2021   IR NEPHROSTOMY PLACEMENT LEFT  08/19/2021   IR NEPHROSTOMY PLACEMENT RIGHT  08/19/2021   IR URETERAL STENT PLACEMENT EXISTING ACCESS LEFT  08/31/2021   IR URETERAL STENT  PLACEMENT EXISTING ACCESS RIGHT  08/31/2021   LAPAROSCOPIC GASTROSTOMY N/A 11/23/2021   Procedure: gastrostomy tube placement endoscopy;  Surgeon: Kinsinger, Arta Bruce, MD;  Location: [facility];  Service: General;  Laterality: N/A;   TONSILECTOMY, ADENOIDECTOMY, BILATERAL MYRINGOTOMY AND TUBES     HPI:  88 year old gentleman with PMH significant for CKD, hypertension, GERD, CHF, dementia and prostate cancer. He was hospitalized at [facility] hospital in 10/22, surgery with urology 12/15 - now w/ Urinary retention, CT imaging, ascites, left lower lobe.  Pt possibly aspirated with dyspnea and speech/swallow eval ordered.  During October hospital admission, pt had a short term feeding tube placed. He remains full code - and was placed on dys1/nectar diet. 12/29 underwent cystoscopy, bilateral ureteral stent change, and placement of suprapubic tube. He was seen by ST services and had been tolerating Dys 1, nectar thick liquids diet. Patient underwent PEG placement on 1/10 and a few hours afterwards had hypotension and bradycardia to 30s. He became unresponsive and CODE BLUE was called prompting initiation of LR bolus and atropine. BP improved but patient remained unresponsive and he was intubated. He was extubated on 1/11 and was receiving PEG tube feedings. ST services reordered on 1/16 in hopes of being able to resume PO diet as he had been receiving prior to intubation.    Assessment / Plan / Recommendation  Clinical Impression  Patient presents with clinical s/s  of dysphagia as per this bedside swallow evaluation, however his swallow does appear improved as compared to initial evaluation and subsequent treatments. After oral care, patient consumed two, 6 ounce cups of water (thin) and 4 ounces juice (thin) via straw sips. He exhibited rapid, successive straw sips but without any overt s/s aspiration or penetration. No delayed coughing or throat clearing observed during this session either. Voice remained clear  throughout. He did accept a small bite of puree (applesauce) and exhibited a mildly delayed oral transit. Patient seems to be desiring only liquids at this time. SLP is recommending to initiate full liquids (thin consistency liquids) diet. SLP will f/u with patient for diet toleration and ability to trial advanced solids. SLP Visit Diagnosis: Dysphagia, oropharyngeal phase (R13.12)    Aspiration Risk  Mild aspiration risk    Diet Recommendation Thin liquid;Other (Comment) (full liquids)   Liquid Administration via: Straw;Cup Medication Administration: Crushed with puree Supervision: Full supervision/cueing for compensatory strategies;Staff to assist with self feeding Compensations: Minimize environmental distractions Postural Changes: Seated upright at 90 degrees;Remain upright for at least 30 minutes after po intake    Other  Recommendations Oral Care Recommendations: Oral care BID;Staff/trained caregiver to provide oral care;Oral care before and after PO    Recommendations for follow up therapy are one component of a multi-disciplinary discharge planning process, led by the attending physician.  Recommendations may be updated based on patient status, additional functional criteria and insurance authorization.  Follow up Recommendations Skilled nursing-short term rehab (<3 hours/day)      Assistance Recommended at Discharge Frequent or constant Supervision/Assistance  Functional Status Assessment Patient has had a recent decline in their functional status and demonstrates the ability to make significant improvements in function in a reasonable and predictable amount of time.  Frequency and Duration min 2x/week  1 week       Prognosis Prognosis for Safe Diet Advancement: Fair Barriers to Reach Goals: Time post onset;Cognitive deficits (patient's willingness to participate)      Swallow Study   General Date of Onset: 11/04/21 HPI: 88 year old gentleman with PMH significant for CKD,  hypertension, GERD, CHF, dementia and prostate cancer. He was hospitalized at [facility] hospital in 10/22, surgery with urology 12/15 - now w/ Urinary retention, CT imaging, ascites, left lower lobe.  Pt possibly aspirated with dyspnea and speech/swallow eval ordered.  During October hospital admission, pt had a short term feeding tube placed. He remains full code - and was placed on dys1/nectar diet. 12/29 underwent cystoscopy, bilateral ureteral stent change, and placement of suprapubic tube. He was seen by ST services and had been tolerating Dys 1, nectar thick liquids diet. Patient underwent PEG placement on 1/10 and a few hours afterwards had hypotension and bradycardia to 30s. He became unresponsive and CODE BLUE was called prompting initiation of LR bolus and atropine. BP improved but patient remained unresponsive and he was intubated. He was extubated on 1/11 and was receiving PEG tube feedings. ST services reordered on 1/16 in hopes of being able to resume PO diet as he had been receiving prior to intubation. Type of Study: Bedside Swallow Evaluation Previous Swallow Assessment: during this admission, BSE Diet Prior to this Study: NPO Temperature Spikes Noted: No Respiratory Status: Room air History of Recent Intubation: Yes Length of Intubations (days): 2 days Date extubated: 11/24/21 Behavior/Cognition: Alert;Cooperative;Confused Oral Cavity Assessment: Other (comment) (mild amount of secretions removed via suction from back of tongue and soft palate) Oral Care Completed by SLP: Yes Oral  Cavity - Dentition: Edentulous Self-Feeding Abilities: Total assist Patient Positioning: Upright in bed Baseline Vocal Quality: Normal Volitional Cough: Strong Volitional Swallow: Unable to elicit    Oral/Motor/Sensory Function Overall Oral Motor/Sensory Function: Generalized oral weakness   Ice Chips     Thin Liquid Thin Liquid: Within functional limits Presentation: Straw Pharyngeal  Phase Impairments:  Other (comments) (no overt s/s aspiration or penetration with 2-6 ounce cups of water and one 4 ounce cup of orange juice, even with successive straw sips)    Nectar Thick     Honey Thick     Puree Puree: Impaired Presentation: Spoon Oral Phase Functional Implications: Prolonged oral transit   Solid     Solid: Not tested     Sonia Baller, MA, CCC-SLP Speech Therapy

## 2021-11-30 DIAGNOSIS — A419 Sepsis, unspecified organism: Secondary | ICD-10-CM | POA: Diagnosis not present

## 2021-11-30 DIAGNOSIS — R627 Adult failure to thrive: Secondary | ICD-10-CM | POA: Diagnosis not present

## 2021-11-30 DIAGNOSIS — R1310 Dysphagia, unspecified: Secondary | ICD-10-CM | POA: Diagnosis not present

## 2021-11-30 DIAGNOSIS — R6521 Severe sepsis with septic shock: Secondary | ICD-10-CM | POA: Diagnosis not present

## 2021-11-30 LAB — CBC
HCT: 24.8 % — ABNORMAL LOW (ref 39.0–52.0)
Hemoglobin: 7.6 g/dL — ABNORMAL LOW (ref 13.0–17.0)
MCH: 32.1 pg (ref 26.0–34.0)
MCHC: 30.6 g/dL (ref 30.0–36.0)
MCV: 104.6 fL — ABNORMAL HIGH (ref 80.0–100.0)
Platelets: 249 10*3/uL (ref 150–400)
RBC: 2.37 MIL/uL — ABNORMAL LOW (ref 4.22–5.81)
RDW: 20.4 % — ABNORMAL HIGH (ref 11.5–15.5)
WBC: 12 10*3/uL — ABNORMAL HIGH (ref 4.0–10.5)
nRBC: 0 % (ref 0.0–0.2)

## 2021-11-30 LAB — BASIC METABOLIC PANEL
Anion gap: 5 (ref 5–15)
BUN: 63 mg/dL — ABNORMAL HIGH (ref 8–23)
CO2: 22 mmol/L (ref 22–32)
Calcium: 7.9 mg/dL — ABNORMAL LOW (ref 8.9–10.3)
Chloride: 113 mmol/L — ABNORMAL HIGH (ref 98–111)
Creatinine, Ser: 1.63 mg/dL — ABNORMAL HIGH (ref 0.61–1.24)
GFR, Estimated: 41 mL/min — ABNORMAL LOW (ref >60)
Glucose, Bld: 131 mg/dL — ABNORMAL HIGH (ref 70–99)
Potassium: 4.3 mmol/L (ref 3.5–5.1)
Sodium: 140 mmol/L (ref 135–145)

## 2021-11-30 LAB — GLUCOSE, CAPILLARY
Glucose-Capillary: 126 mg/dL — ABNORMAL HIGH (ref 70–99)
Glucose-Capillary: 107 mg/dL — ABNORMAL HIGH (ref 70–99)
Glucose-Capillary: 121 mg/dL — ABNORMAL HIGH (ref 70–99)
Glucose-Capillary: 109 mg/dL — ABNORMAL HIGH (ref 70–99)
Glucose-Capillary: 116 mg/dL — ABNORMAL HIGH (ref 70–99)
Glucose-Capillary: 122 mg/dL — ABNORMAL HIGH (ref 70–99)

## 2021-11-30 MED ORDER — ENSURE ENLIVE PO LIQD
237.0000 mL | Freq: Two times a day (BID) | ORAL | Status: DC
  Administered 2021-11-30 – 2021-12-06 (×12): 237 mL via ORAL

## 2021-11-30 NOTE — Progress Notes (Signed)
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  He has hx of metastatic prostate cancer, urethral erosion, and bilateral hydronephrosis.  He was treated with broad spectrum ABx and pressors.  Had clinical improvement and transferred to hospitalist service on 12/25.  He subsequently had cystoscopy with b/l ureteral stent exchange and suprapubic catheter placement on 12/29.  He had dysphagia with poor caloric intake and started on TPN 1/07.  He had PEG placement on 1/10.  Post procedure he developed bradycardia with hypotension and altered mental status.  He was intubated, started on pressors and IV fluids, and transferred to ICU.  PCCM resumed primary management in ICU.  Pertinent Medical History:  GERD, Anemia, Anxiety, Depression, CKD 3b, Dementia, Metastatic prostate cancer, SVT, CHF, Sepsis, Failure to thrive  Significant Hospital Events: Including procedures, antibiotic start and stop dates in addition to other pertinent events   12/21 Admit with urosepsis, septic shock  12/25 to [facility]  12/29 Cystoscopy with bilateral ureteral stent exchange and suprapubic catheter placement  1/03 CCS consulted for PEG due to persistent dysphagia and poor intake  1/07 PICC placed for TPN  1/10 PEG placement per CCS.  Post placement on floor, pt developed hypotension and bradycardia to 30's requiring BVM.  Intubated by anesthesia.  LR bolus + atropine. Did not require CPR.  1/11 Extubated  1/12 Breathing okay.  Denies chest, abdominal pain.  Remains on pressors. 1/13 Levophed 75mcg. Goal sbp > 90 per RN. Has PEG tube now and suprapubic. RN reports baseline failure to  thrive. Goals of care with DPOA brother - full code. Declined palliative care consult 1/14 Continues on Levophed at 2 mcg.  Difficult to wean down further.  Diastolic appears very low.Marland Kitchen  He is on room air.  Afebrile with stable white count.  On antibiotics.  Culture negative so far.  RN concerned about tachycardia and bradycardia including nonsustained V. tach.  EKG showed sinus bradycardia with PACs.  Patient also asking for water.  He is on PEG tube feeds and also TPN. 11/15 Levophed changed to Neo-Synephrine yesterday and patient having persistent bradycardia sometimes dipping into heart rate 30s.  Sinus on EKG.  Currently on Neo-Synephrine, midodrine and hydrocortisone.  RN thinks he is worse today with increased somnolence.  Today is his birthday.  Echo yesterday is normal.  Stop TPN.  Continue tube feeds 1/16 Off levophed, passed swallow evaluation   Interim History / Subjective:   Pt denies pain, SOB  Objective:  Blood pressure 140/69, pulse 87, temperature 97.7 F (36.5 C), temperature source Oral, resp. rate (!) 22, height 5\' 9"  (1.753 m), weight 87 kg, SpO2 98 %.        Intake/Output Summary (Last 24 hours) at 11/30/2021 0754 Last data filed at 11/30/2021 6226 Gross per 24 hour  Intake 2038.02 ml  Output 1725 ml  Net 313.02 ml   Filed Weights   11/27/21 0458 11/29/21 0459 11/30/21 0403  Weight: 85 kg 86 kg 87 kg   Physical Exam: General: frail elderly adult male lying in bed in NAD   HEENT: MM pink/dry, wearing glasses, anicteric Neuro: Awake, alert, oriented to self, pleasant,  generalized weakness but moves all extremities  CV: s1s2 R, no m/r/g PULM: non-labored at rest, good cough, lungs bilaterally clear anterior, faint basilar crackles  GI: soft, bsx4 active  Extremities: warm/dry, no significant edema, bilateral great toe circular purple areas unchanged  Skin: no rashes or lesions  Resolved Hospital Problem List:    Assessment & Plan:   Acute Hypoxic Respiratory  Failure s/p sedation for procedure - resolved.  Extubated 1/11 w/o further respiratory difficulties  -push pulmonary hygiene  -mobilize   Aspiration Pneumonitis Completed 5 days unasyn  -monitor off abx  -follow intermittent CXR  -feeding per PEG  -diet recommendations per SLP   Acute Encephalopathy superimposed on Dementia  -supportive care -delirium prevention measures  -avoid sedating medications   Bradycardia, Hypotension  Seen by Dr. Virgina Jock in 10/22 for tachycardia. Not a candidate for PPM due to functional status  / concerns for recent sepsis per Cardiology. ECHO, troponin wnl.  -continue midodrine, hydrocortisone  -follow off lopressor  -not a candidate for PPM per Cardiology  -would not recommend CPR/intubation, continue ongoing discussions with family    Dysphagia Severe protein calorie malnutrition. S/p PEG.   Off TPN on 11/27/2021 -TF -SLP evaluation appreciated, largely for comfort.  Doubt he can take in enough to support nutrition needs.    Metastatic prostate cancer Chronic Urine retention s/p Suprapubic Catheter Acute on chronic renal failure  October 2022 creatinine 2.2, admission creatinine 11/03/2021 6.08   -Trend BMP / urinary output -Replace electrolytes as indicated -Avoid nephrotoxic agents, ensure adequate renal perfusion   Macrocytic Anemia with Anemia Critical Illness -follow CBC, transfuse for Hgb <7% -if nearing need for pressors or soft pressures, would consider PRBC given Hgb drift    Hyperglycemia. -SSI    Failure To Thrive (POA) SNF Resident - Prior to & Present on Admit Severe Protein Calorie Malnutrition - alb 2s - Prior to & Present on Admit Pressure injuries. -push nutrition  -WOC for wounds  -brother previously not willing to consider palliative care consult   Best Practice: (right click and "Reselect all SmartList Selections" daily)  Diet/type: tube feeds  DVT prophylaxis: SQ lovenox in setting of cancer GI prophylaxis:  protonix Lines: Rt double lumen PICC 1/07 >>  Foley:  Suprapubic catheter 12/29 >>  Code Status:  full code Last date of multidisciplinary goals of care discussion: bother updated 1/11 - 1/13. Full code per brother / HCPOA.  See prior discussions.   PCCM will transfer care to [facility] as of 1/18.  Please call back if new needs arise.    Critical Care Time: n/a   Noe Gens, MSN, APRN, NP-C, AGACNP-BC  Pulmonary & Critical Care 11/30/2021, 7:54 AM   Please see [email or web address] for pager details.   From 7A-7P if no response, please call [phone] After hours, please call ELink [phone]

## 2021-11-30 NOTE — Progress Notes (Signed)
Nutrition Follow-up  DOCUMENTATION CODES:   Non-severe (moderate) malnutrition in context of chronic illness  INTERVENTION:  - continue Osmolite 1.5 @ 55 ml/hr with 45 Prosource TF TID. - free water flush to continue to be per CCM. - if PO intakes remain good and/or diet advanced, will consider transition back to nocturnal TF to continue encouraging PO intake during the day. - will order Ensure Enlive BID, each supplement provides 350 kcal and 20 grams of protein.   NUTRITION DIAGNOSIS:   Moderate Malnutrition related to chronic illness, cancer and cancer related treatments as evidenced by mild fat depletion, moderate muscle depletion. -ongoing  GOAL:   Patient will meet greater than or equal to 90% of their needs -met with TF regimen and PO intakes.   MONITOR:   PO intake, Supplement acceptance, TF tolerance, Labs, Weight trends, Skin   ASSESSMENT:   88 yo male with medical history of CKD, HTN, heart failure, dementia, and prostate cancer. Of note, he was hospitalized in 08/2021 with sepsis and AKI on CKD 2/2 obstruction related to metastatic prostate cancer. Patient was admitted for septic shock 2/2 UTI.  Significant Events: 12/22- admission 12/31- initial RD assessment for Calorie Count 1/7- double lumen PICC placed in R basilic; TPN initiation 7/78- 24 F G-tube placed by Surgery; Rapid Response, Code Blue, intubation 1/11- extubation; Osmolite 1.5 started at 20 ml/hr via G-tube 1/12- TF regimen changed from x14 hours/day to x24 hours/day due to patient remaining NPO 1/13- TPN decreased to 40 ml/hr 1/14- TPN stopped 1/16- SLP re-evaluation and diet advanced to FLD, thin liquids   Patient discussed in rounds this AM and one-on-one with RN and Tech. Patient consumed 100% of a carton of milk, grits, and ice cream. He also consumed Vital Cuisine shake that was on his breakfast tray. Patient did require feeding assistance.  He has G-tube and is receiving TF regimen at goal  rate: Osmolite 1.5 @ 55 ml/hr with 45 ml Prosource TF TID and 200 ml free water every 4 hours. This regimen is providing 2100 kcal, 116 grams protein, and 2208 ml free water.   Weight trending up and is the highest weight this hospitalization. Non-pitting edema to BUE and mild pitting edema to BLE documented in the edema section of flow sheet.   *RD noted that Osmolite 1.5 had been poured into a kangaroo bag for infusion. Called Pharmacy and confirmed that there is adequate stock of ready-to-hang bottles of Osmolite 1.5. Communicated with RN at bedside who had by that time changed to ready-to-hang bottle. She will communicate with night shift that ready-to-hang bottle of Osmolite 1.5 to be used.    Labs reviewed; CBGs: 126, 107, 121 mg/dl, Cl: 113 mmol/l, BUN: 63 mg/dl, creatinine: 1.63 mg/dl, Ca: 7.9 mg/dl, GFR: 41 ml/min.   Medications reviewed; 100 mg solu-cortef BID, sliding scale novolog, 2 g IV Mg sulfate x1 run 1/16, 15 ml multivitamin/day, 40 mg protonix per G-tube/day.   Diet Order:   Diet Order             Diet full liquid Room service appropriate? Yes; Fluid consistency: Thin  Diet effective now                   EDUCATION NEEDS:   Not appropriate for education at this time  Skin:  Skin Assessment: Skin Integrity Issues: Skin Integrity Issues:: Stage III, DTI, Stage II, Stage I DTI: R heel Stage I: R toe Stage II: vertebral column Stage III: sacrum  Last BM:  1/17 (200 ml from flexi)  Height:   Ht Readings from Last 1 Encounters:  11/23/21 '5\' 9"'  (1.753 m)    Weight:   Wt Readings from Last 1 Encounters:  11/30/21 87 kg     Estimated Nutritional Needs:  Kcal:  2100-2300 kcal Protein:  105-120 grams Fluid:  >/= 2.3 L/day     Jarome Matin, MS, RD, LDN Inpatient Clinical Dietitian RD pager # available in [place]  After hours/weekend pager # available in [facility]

## 2021-12-01 LAB — BASIC METABOLIC PANEL
Anion gap: 6 (ref 5–15)
BUN: 66 mg/dL — ABNORMAL HIGH (ref 8–23)
CO2: 22 mmol/L (ref 22–32)
Calcium: 8.1 mg/dL — ABNORMAL LOW (ref 8.9–10.3)
Chloride: 109 mmol/L (ref 98–111)
Creatinine, Ser: 1.48 mg/dL — ABNORMAL HIGH (ref 0.61–1.24)
GFR, Estimated: 46 mL/min — ABNORMAL LOW (ref >60)
Glucose, Bld: 107 mg/dL — ABNORMAL HIGH (ref 70–99)
Potassium: 5 mmol/L (ref 3.5–5.1)
Sodium: 137 mmol/L (ref 135–145)

## 2021-12-01 LAB — GLUCOSE, CAPILLARY
Glucose-Capillary: 114 mg/dL — ABNORMAL HIGH (ref 70–99)
Glucose-Capillary: 114 mg/dL — ABNORMAL HIGH (ref 70–99)
Glucose-Capillary: 131 mg/dL — ABNORMAL HIGH (ref 70–99)
Glucose-Capillary: 89 mg/dL (ref 70–99)
Glucose-Capillary: 96 mg/dL (ref 70–99)
Glucose-Capillary: 96 mg/dL (ref 70–99)

## 2021-12-01 LAB — CBC
HCT: 24.6 % — ABNORMAL LOW (ref 39.0–52.0)
Hemoglobin: 7.6 g/dL — ABNORMAL LOW (ref 13.0–17.0)
MCH: 32.1 pg (ref 26.0–34.0)
MCHC: 30.9 g/dL (ref 30.0–36.0)
MCV: 103.8 fL — ABNORMAL HIGH (ref 80.0–100.0)
Platelets: 257 10*3/uL (ref 150–400)
RBC: 2.37 MIL/uL — ABNORMAL LOW (ref 4.22–5.81)
RDW: 20.7 % — ABNORMAL HIGH (ref 11.5–15.5)
WBC: 13.7 10*3/uL — ABNORMAL HIGH (ref 4.0–10.5)
nRBC: 0.2 % (ref 0.0–0.2)

## 2021-12-01 MED ORDER — MIRTAZAPINE 15 MG PO TABS
7.5000 mg | ORAL_TABLET | Freq: Every day | ORAL | Status: DC
  Administered 2021-12-01 – 2021-12-05 (×4): 7.5 mg
  Filled 2021-12-01 (×4): qty 1

## 2021-12-01 MED ORDER — HYDROCORTISONE SOD SUC (PF) 100 MG IJ SOLR: 50.0000 mg | Freq: Two times a day (BID) | INTRAMUSCULAR | Status: DC

## 2021-12-01 NOTE — Progress Notes (Addendum)
Speech Language Pathology Treatment: Dysphagia  Patient Details Name: Anthony Dudley MRN: [id number] DOB: 1933/08/04 Today's Date: 12/01/2021 Time: 0947-0962 SLP Time Calculation (min) (ACUTE ONLY): 15 min  Assessment / Plan / Recommendation Clinical Impression  Patient seen to address dysphagia goals, he was alert and willing to consume some solids. Pt is edentulous and states "I wish" when SLP Inquired if he had dentures.  PO trials of Ensure, water and cookie provided - pt required total assist for intake and needed total assist for repositioning for optimal safety.  Prolonged mastication and oral transiting with solids clnically observed. Swallow immediately followed by cough with 2 attempts at solids - despite moistening with Ensure.  SLP cued pt to cough and clear - but this was not effective.  PO intake of Ensure via straw tolerated well without clinical indication of aspiration.    At this time, Anthony Dudley is not deemed to be ready for dietary advancement.  Recommend continue full liquid with supervision - and strict precautions.  Using teach back, pt informed.  SLP recommends for pt to participate in Respiratory Muscle Strength Training to maximize cough/expectoration and swallow function.     Addendum:  Received approval from MD via secure chat at 1648.  Thank you.   Pt is agreeable to plan if MD approves given recent h/o bradycardia.  SLP anticipates pt will require tube feeding long term due to his poor intake and continue dysphagia with solids more than liquids.    HPI HPI: 88 year old gentleman with PMH significant for CKD, hypertension, GERD, CHF, dementia and prostate cancer. He was hospitalized at [facility] hospital in 10/22, surgery with urology 12/15 - now w/ Urinary retention, CT imaging, ascites, left lower lobe.  Pt possibly aspirated with dyspnea and speech/swallow eval ordered.  During October hospital admission, pt had a short term feeding tube placed. He remains full code - and  was placed on dys1/nectar diet. 12/29 underwent cystoscopy, bilateral ureteral stent change, and placement of suprapubic tube. He was seen by ST services and had been tolerating Dys 1, nectar thick liquids diet. Patient underwent PEG placement on 1/10 and a few hours afterwards had hypotension and bradycardia to 30s. He became unresponsive and CODE BLUE was called prompting initiation of LR bolus and atropine. BP improved but patient remained unresponsive and he was intubated. He was extubated on 1/11 and was receiving PEG tube feedings. ST services reordered on 1/16 in hopes of being able to resume PO diet as he had been receiving prior to intubation.  Pt was started on full liquid diet - per notes, pt with occasional cough with liquids. NT reports if he takes small sips, he tolerates better.  SLP follow up to address dysphagia goals.      SLP Plan         Recommendations for follow up therapy are one component of a multi-disciplinary discharge planning process, led by the attending physician.  Recommendations may be updated based on patient status, additional functional criteria and insurance authorization.    Recommendations                            Kathleen Lime, MS [place] [phone] Cell [phone]    Macario Golds  12/01/2021, 4:43 PM

## 2021-12-01 NOTE — Progress Notes (Signed)
Anthony Dudley  [id number]:[id number] DOB: July 27, 1933 DOA: 11/03/2021 PCP: [name], MD    Brief Narrative:  88 year old with a history of dementia, legal blindness, FTT, and metastatic prostate cancer with bilateral hydronephrosis who was originally admitted 12/21 with septic shock related to UTI.  During his hospital stay he underwent a suprapubic catheter placement as well as bilateral ureteral stent exchange on 12/29.  His postoperative stay was complicated by dysphagia which has limited his oral intake significantly.  On 1/10 he underwent PEG placement complicated by bradycardia hypotension and AMS postoperatively.  He required intubation pressors and IV fluids in the ICU.  He has transiently required TPN and has subsequently transitioned to tube feeds.  He has since improved, been extubated, and passed a swallowing study.  Significant Events:  12/21 Admit with urosepsis, septic shock  12/25 to [facility]  12/29 Cystoscopy with B ureteral stent exchange and suprapubic catheter placement  1/03 CCS consulted for PEG due to persistent dysphagia and poor intake  1/07 PICC placed for TPN  1/10 PEG placement per CCS - postop hypotension and bradycardia - intubated by anesthesia 1/11 Extubated  1/13 Goals of care with [place] brother - full code - declined palliative care consult 1/14 Continues on Levophed at 2 mcg - difficult to wean down further 11/15 Levophed changed to Neo-Synephrine - persistent bradycardia - stop TPN > tube feeds 1/16 Off levophed, passed swallow evaluation   Consultants:  PCCM General Surgery Urology  Code Status: FULL CODE  Antimicrobials:  None presently  DVT prophylaxis: Lovenox   Interim Hx: Bradycardia persist with heart rate 39-60.  Hypotension persists with blood pressure 82-106.  Saturations stable.  Resting comfortably in bed.  Does not appear to be in acute distress.  Opens eyes and interacts with examiner.  Communicates no complaints.  Assessment &  Plan:  Septic shock - Pyelonephritis Now off pressors and has completed antibiotic therapy -continue midodrine -indefinite suprapubic catheter with management per Urology  Acute hypoxic respiratory failure Felt to be due to sedation from procedure -required very brief ventilator support 1/10-1/11 - saturations now stable  Aspiration pneumonitis Completed 5 days of Unasyn -no evidence of persisting pneumonitis  Acute metabolic encephalopathy superimposed on dementia - failure to thrive HC POA has been resistant to goals of care discussion -appears alert but confused at exam today  Persisting bradycardia with hypotension Has been evaluated previously by Cardiology and deemed not to be a candidate for pacemaker placement  Metastatic prostate cancer  AKI on CKD Volume expanding - baseline creatinine 2.2 - creatinine 6.08 at time of admission 11/03/2021 -creatinine continues to trend downward  Recent Labs  Lab 11/27/21 0500 11/28/21 1214 11/29/21 0432 11/30/21 0415 12/01/21 0500  CREATININE 1.44* 1.51* 1.58* 1.63* 1.48*     Transaminitis  Etiology not clear -monitor trend  Recent Labs  Lab 11/25/21 0420 11/28/21 1214 11/29/21 0432  AST 27 130* 113*  ALT 15 95* 110*  ALKPHOS 329* 327* 308*  BILITOT 0.3 0.4 0.4  PROT 6.2* 5.9* 5.9*  ALBUMIN 2.1* 2.2* 2.2*     Dysphagia SLP following  Moderate malnutrition in context of chronic illness  Hyperglycemia CBG reasonably controlled  Acute on chronic anemia - macrocytosis  Transfuse as necessary when hemoglobin drops below 7  Decubitus ulcers-stage I toes, unstageable sacrum, stage II lumbar spine, DTI to the right heel W OC following   Family Communication: No family present at time of exam Disposition: Return to SNF  Objective: Blood pressure (!) 106/45, pulse 60,  temperature 97.7 F (36.5 C), temperature source Oral, resp. rate 18, height 5\' 9"  (1.753 m), weight 91 kg, SpO2 97 %.  Intake/Output Summary  (Last 24 hours) at 12/01/2021 0750 Last data filed at 12/01/2021 0539 Gross per 24 hour  Intake 2148.86 ml  Output 1550 ml  Net 598.86 ml   Filed Weights   11/29/21 0459 11/30/21 0403 12/01/21 0500  Weight: 86 kg 87 kg 91 kg    Examination: General: No acute respiratory distress Lungs: Clear to auscultation bilaterally without wheezes or crackles Cardiovascular: Regular rate and rhythm without murmur gallop or rub normal S1 and S2 Abdomen: Nontender, nondistended, soft, bowel sounds positive, no rebound, no ascites, no appreciable mass -PEG tube and suprapubic catheter appreciated Extremities: No significant cyanosis, clubbing, or edema bilateral lower extremities  CBC: Recent Labs  Lab 11/29/21 0000 11/30/21 0415 12/01/21 0500  WBC 12.3* 12.0* 13.7*  HGB 7.3* 7.6* 7.6*  HCT 23.3* 24.8* 24.6*  MCV 103.6* 104.6* 103.8*  PLT 247 249 562   Basic Metabolic Panel: Recent Labs  Lab 11/26/21 0600 11/27/21 0500 11/28/21 1214 11/29/21 0432 11/30/21 0415 12/01/21 0500  NA 134* 141 140 141 140 137  K 3.4* 4.6 4.1 4.2 4.3 5.0  CL 105 112* 112* 113* 113* 109  CO2 21* 22 22 21* 22 22  GLUCOSE 123* 146* 167* 124* 131* 107*  BUN 59* 66* 64* 67* 63* 66*  CREATININE 1.37* 1.44* 1.51* 1.58* 1.63* 1.48*  CALCIUM 7.5* 8.0* 7.7* 7.9* 7.9* 8.1*  MG 1.8 2.1 2.0 1.8  --   --   PHOS 3.6  --  4.0 3.8  --   --    GFR: Estimated Creatinine Clearance: 39.2 mL/min (A) (by C-G formula based on SCr of 1.48 mg/dL (H)).  Liver Function Tests: Recent Labs  Lab 11/25/21 0420 11/28/21 1214 11/29/21 0432  AST 27 130* 113*  ALT 15 95* 110*  ALKPHOS 329* 327* 308*  BILITOT 0.3 0.4 0.4  PROT 6.2* 5.9* 5.9*  ALBUMIN 2.1* 2.2* 2.2*    HbA1C: Hgb A1c MFr Bld  Date/Time Value Ref Range Status  11/27/2021 02:50 PM 5.7 (H) 4.8 - 5.6 % Final    Comment:    (NOTE) Pre diabetes:          5.7%-6.4%  Diabetes:              >6.4%  Glycemic control for   <7.0% adults with diabetes    09/07/2021 04:06 AM 6.1 (H) 4.8 - 5.6 % Final    Comment:    (NOTE) Pre diabetes:          5.7%-6.4%  Diabetes:              >6.4%  Glycemic control for   <7.0% adults with diabetes     CBG: Recent Labs  Lab 11/30/21 1142 11/30/21 1636 11/30/21 2012 11/30/21 2322 12/01/21 0339  GLUCAP 121* 109* 116* 122* 131*    No results found for this or any previous visit (from the past 240 hour(s)).   Scheduled Meds:  chlorhexidine  15 mL Mouth Rinse BID   Chlorhexidine Gluconate Cloth  6 each Topical Daily   collagenase   Topical Daily   enoxaparin (LOVENOX) injection  40 mg Subcutaneous Daily   feeding supplement  237 mL Oral BID BM   feeding supplement (OSMOLITE 1.5 CAL)  1,000 mL Per Tube Q24H   feeding supplement (PROSource TF)  45 mL Per Tube TID   free water  200  mL Per Tube Q4H   hydrocortisone sod succinate (SOLU-CORTEF) inj  100 mg Intravenous Q12H   insulin aspart  0-9 Units Subcutaneous Q4H   liver oil-zinc oxide   Topical BID   mouth rinse  15 mL Mouth Rinse q12n4p   midodrine  10 mg Per Tube TID with meals   multivitamin  15 mL Per Tube Daily   pantoprazole sodium  40 mg Per Tube Daily   sodium chloride flush  10-40 mL Intracatheter Q12H   Continuous Infusions:  sodium chloride 10 mL/hr at 12/01/21 0539     LOS: 28 days   Cherene Altes, MD [facility] Office  [phone] Pager - Text Page per Shea Evans  If 7PM-7AM, please contact night-coverage per Amion 12/01/2021, 7:50 AM

## 2021-12-02 LAB — GLUCOSE, CAPILLARY
Glucose-Capillary: 77 mg/dL (ref 70–99)
Glucose-Capillary: 80 mg/dL (ref 70–99)

## 2021-12-02 MED ORDER — SODIUM CHLORIDE 0.9 % IV SOLN: INTRAVENOUS | Status: DC

## 2021-12-02 MED ORDER — ACETAMINOPHEN 160 MG/5ML PO SOLN
650.0000 mg | Freq: Four times a day (QID) | ORAL | Status: DC | PRN
  Administered 2021-12-02: 650 mg
  Filled 2021-12-02: qty 20.3

## 2021-12-02 MED ORDER — LACTATED RINGERS IV BOLUS
250.0000 mL | Freq: Once | INTRAVENOUS | Status: AC
  Administered 2021-12-02: 250 mL via INTRAVENOUS

## 2021-12-02 MED ORDER — SODIUM CHLORIDE 0.9 % IV BOLUS
500.0000 mL | Freq: Once | INTRAVENOUS | Status: AC
  Administered 2021-12-02: 500 mL via INTRAVENOUS

## 2021-12-02 MED ORDER — FREE WATER
300.0000 mL | Status: DC
  Administered 2021-12-02 – 2021-12-06 (×20): 300 mL

## 2021-12-02 NOTE — Progress Notes (Signed)
Anthony Dudley  QQP:[id number] DOB: August 14, 1933 DOA: 11/03/2021 PCP: [name], MD    Brief Narrative:  [phone] with a history of dementia, legal blindness, FTT, and metastatic prostate cancer with bilateral hydronephrosis who was originally admitted 12/21 with septic shock related to UTI.  During his hospital stay he underwent a suprapubic catheter placement as well as B ureteral stent exchange on 12/29.  His postoperative stay was complicated by dysphagia which has limited his oral intake significantly.  On 1/10 he underwent PEG placement complicated by bradycardia hypotension and AMS postoperatively.  He required intubation pressors and IV fluids in the ICU.  He transiently required TPN and has subsequently transitioned to tube feeds.  He has since improved, been extubated, and passed a swallowing study for intake of liquids only.   Significant Events:  12/21 Admit with urosepsis, septic shock  12/25 to [facility]  12/29 Cystoscopy with B ureteral stent exchange and suprapubic catheter placement  1/03 CCS consulted for PEG due to persistent dysphagia and poor intake  1/07 PICC placed for TPN  1/10 PEG placement per CCS - postop hypotension and bradycardia - intubated by anesthesia 1/11 Extubated  1/13 Goals of care with [place] brother - full code - declined palliative care consult 1/14 Continues on Levophed at 2 mcg - difficult to wean down further 11/15 Levophed changed to Neo-Synephrine - persistent bradycardia - stop TPN > tube feeds 1/16 Off levophed, passed swallow evaluation for full liquids  1/18 PICC line removed    Consultants:  PCCM General Surgery Urology  Code Status: FULL CODE  Antimicrobials:  None presently  DVT prophylaxis: Lovenox   Interim Hx: Afebrile.  Systolic blood pressure low at 86-109.  Saturation 100% on room air.  CBG well controlled.  Awakens to voice but speech is garbled and difficult to comprehend.  Does not appear to be in acute distress.  No evidence  of discomfort.  Assessment & Plan:  Pyelonephritis w/ Sepsis and Septic Shock Now off pressors and has completed antibiotic therapy - continue midodrine - indefinite suprapubic catheter with management per Urology - stopped stress dose steroids 1/18 - volume expand today as the patient appears clinically dehydrated   Acute hypoxic respiratory failure Felt to be due to sedation from procedure - required very brief ventilator support 1/10-1/11 - saturations now stable  Aspiration pneumonitis Completed 5 days of Unasyn -no evidence of persisting pneumonitis  Acute metabolic encephalopathy superimposed on dementia - failure to thrive HCPOA has been resistant to goals of care discussions and insists on full care/full code - disposition will be SNF placement   Persisting bradycardia with hypotension Has been evaluated previously by Cardiology and deemed not to be a candidate for pacemaker placement - volume expanding today as pt appears to be dehydrated   Metastatic prostate cancer Known widespread sclerotic bone metastases - being tx by Urology w/ ADT/enzalutamide  AKI on CKD baseline creatinine 2.2 - creatinine 6.08 at time of admission 11/03/2021 - creatinine continues to trend downward and is presently better than his reported baseline   Recent Labs  Lab 11/27/21 0500 11/28/21 1214 11/29/21 0432 11/30/21 0415 12/01/21 0500  CREATININE 1.44* 1.51* 1.58* 1.63* 1.48*    Transaminitis  Etiology not clear - monitor trend - avoid meds which may cause or exacerbate this   Recent Labs  Lab 11/28/21 1214 11/29/21 0432  AST 130* 113*  ALT 95* 110*  ALKPHOS 327* 308*  BILITOT 0.4 0.4  PROT 5.9* 5.9*  ALBUMIN 2.2* 2.2*  Dysphagia SLP following w/ re-eval on 1/18 - not yet ready to advance beyond full liquid diet   Long standing chronic urinary retention Now has suprapubic cath   Moderate malnutrition in context of chronic illness Continue tube feed support in addition  to full liquid oral diet  Hyperglycemia Steroid induced - no longer an issue- pt is not diabetic - stop CBG checks   Acute on chronic anemia - macrocytosis  Transfuse as necessary when hemoglobin drops below 7  Decubitus ulcers-stage I toes, unstageable sacrum, stage II lumbar spine, DTI to the right heel W OC following   Family Communication: No family present at time of exam Disposition: Return to SNF  Objective: Blood pressure (!) 86/49, pulse 73, temperature 97.9 F (36.6 C), resp. rate (!) 24, height 5\' 9"  (1.753 m), weight 87 kg, SpO2 97 %.  Intake/Output Summary (Last 24 hours) at 12/02/2021 0748 Last data filed at 12/02/2021 0515 Gross per 24 hour  Intake 120.79 ml  Output 2200 ml  Net -2079.21 ml    Filed Weights   11/30/21 0403 12/01/21 0500 12/02/21 0339  Weight: 87 kg 91 kg 87 kg    Examination: General: No acute respiratory distress Lungs: Clear to auscultation bilaterally  Cardiovascular: RRR Abdomen: Nontender, nondistended, soft, bowel sounds positive, no rebound, no appreciable mass -PEG tube and suprapubic catheter appreciated Extremities: No significant cyanosis, clubbing, or edema B LE   CBC: Recent Labs  Lab 11/29/21 0000 11/30/21 0415 12/01/21 0500  WBC 12.3* 12.0* 13.7*  HGB 7.3* 7.6* 7.6*  HCT 23.3* 24.8* 24.6*  MCV 103.6* 104.6* 103.8*  PLT 247 249 710    Basic Metabolic Panel: Recent Labs  Lab 11/26/21 0600 11/27/21 0500 11/28/21 1214 11/29/21 0432 11/30/21 0415 12/01/21 0500  NA 134* 141 140 141 140 137  K 3.4* 4.6 4.1 4.2 4.3 5.0  CL 105 112* 112* 113* 113* 109  CO2 21* 22 22 21* 22 22  GLUCOSE 123* 146* 167* 124* 131* 107*  BUN 59* 66* 64* 67* 63* 66*  CREATININE 1.37* 1.44* 1.51* 1.58* 1.63* 1.48*  CALCIUM 7.5* 8.0* 7.7* 7.9* 7.9* 8.1*  MG 1.8 2.1 2.0 1.8  --   --   PHOS 3.6  --  4.0 3.8  --   --     GFR: Estimated Creatinine Clearance: 38.4 mL/min (A) (by C-G formula based on SCr of 1.48 mg/dL (H)).  Liver  Function Tests: Recent Labs  Lab 11/28/21 1214 11/29/21 0432  AST 130* 113*  ALT 95* 110*  ALKPHOS 327* 308*  BILITOT 0.4 0.4  PROT 5.9* 5.9*  ALBUMIN 2.2* 2.2*     HbA1C: Hgb A1c MFr Bld  Date/Time Value Ref Range Status  11/27/2021 02:50 PM 5.7 (H) 4.8 - 5.6 % Final    Comment:    (NOTE) Pre diabetes:          5.7%-6.4%  Diabetes:              >6.4%  Glycemic control for   <7.0% adults with diabetes   09/07/2021 04:06 AM 6.1 (H) 4.8 - 5.6 % Final    Comment:    (NOTE) Pre diabetes:          5.7%-6.4%  Diabetes:              >6.4%  Glycemic control for   <7.0% adults with diabetes     CBG: Recent Labs  Lab 12/01/21 1340 12/01/21 1631 12/01/21 1930 12/01/21 2347 12/02/21 0332  GLUCAP 89  114* 96 96 77     No results found for this or any previous visit (from the past 240 hour(s)).   Scheduled Meds:  chlorhexidine  15 mL Mouth Rinse BID   Chlorhexidine Gluconate Cloth  6 each Topical Daily   collagenase   Topical Daily   enoxaparin (LOVENOX) injection  40 mg Subcutaneous Daily   feeding supplement  237 mL Oral BID BM   feeding supplement (OSMOLITE 1.5 CAL)  1,000 mL Per Tube Q24H   feeding supplement (PROSource TF)  45 mL Per Tube TID   free water  200 mL Per Tube Q4H   insulin aspart  0-9 Units Subcutaneous Q4H   mouth rinse  15 mL Mouth Rinse q12n4p   midodrine  10 mg Per Tube TID with meals   mirtazapine  7.5 mg Per Tube QHS   multivitamin  15 mL Per Tube Daily   pantoprazole sodium  40 mg Per Tube Daily   sodium chloride flush  10-40 mL Intracatheter Q12H     LOS: 29 days   Cherene Altes, MD [facility] Office  [phone] Pager - Text Page per Shea [facility]  If 7PM-7AM, please contact night-coverage per Amion 12/02/2021, 7:48 AM

## 2021-12-03 ENCOUNTER — Inpatient Hospital Stay (HOSPITAL_COMMUNITY): Payer: Medicare Other

## 2021-12-03 LAB — COMPREHENSIVE METABOLIC PANEL
ALT: 61 U/L — ABNORMAL HIGH (ref 0–44)
AST: 27 U/L (ref 15–41)
Albumin: 2.3 g/dL — ABNORMAL LOW (ref 3.5–5.0)
Alkaline Phosphatase: 226 U/L — ABNORMAL HIGH (ref 38–126)
Anion gap: 7 (ref 5–15)
BUN: 58 mg/dL — ABNORMAL HIGH (ref 8–23)
CO2: 18 mmol/L — ABNORMAL LOW (ref 22–32)
Calcium: 7.6 mg/dL — ABNORMAL LOW (ref 8.9–10.3)
Chloride: 113 mmol/L — ABNORMAL HIGH (ref 98–111)
Creatinine, Ser: 1.62 mg/dL — ABNORMAL HIGH (ref 0.61–1.24)
GFR, Estimated: 41 mL/min — ABNORMAL LOW (ref >60)
Glucose, Bld: 107 mg/dL — ABNORMAL HIGH (ref 70–99)
Potassium: 3.3 mmol/L — ABNORMAL LOW (ref 3.5–5.1)
Sodium: 138 mmol/L (ref 135–145)
Total Bilirubin: 0.2 mg/dL — ABNORMAL LOW (ref 0.3–1.2)
Total Protein: 5.6 g/dL — ABNORMAL LOW (ref 6.5–8.1)

## 2021-12-03 LAB — CBC
HCT: 26 % — ABNORMAL LOW (ref 39.0–52.0)
Hemoglobin: 7.9 g/dL — ABNORMAL LOW (ref 13.0–17.0)
MCH: 32.5 pg (ref 26.0–34.0)
MCHC: 30.4 g/dL (ref 30.0–36.0)
MCV: 107 fL — ABNORMAL HIGH (ref 80.0–100.0)
Platelets: 251 10*3/uL (ref 150–400)
RBC: 2.43 MIL/uL — ABNORMAL LOW (ref 4.22–5.81)
RDW: 21.2 % — ABNORMAL HIGH (ref 11.5–15.5)
WBC: 21 10*3/uL — ABNORMAL HIGH (ref 4.0–10.5)
nRBC: 0.1 % (ref 0.0–0.2)

## 2021-12-03 LAB — GLUCOSE, CAPILLARY: Glucose-Capillary: 70 mg/dL (ref 70–99)

## 2021-12-03 MED ORDER — IOHEXOL 300 MG/ML  SOLN
100.0000 mL | Freq: Once | INTRAMUSCULAR | Status: AC | PRN
  Administered 2021-12-03: 50 mL

## 2021-12-03 NOTE — Progress Notes (Signed)
Upon arrival to patient's room to administer  morning medications, PEG tube was out of place. Patient accidentally removed PEG sometime in the morning. MD Thereasa Solo made aware and consulted general surgery to take a look. General surgery placed a foley catheter tube in place for the mean time, ordered an xray to verify placement and will come back later to replace the PEG tube.

## 2021-12-03 NOTE — Progress Notes (Signed)
Foley catheter removed from site and new 17F g-tube with balloon retention was replaced in the site.  This flushed well with bilious pull back.  Location film has been ordered.  If this reads good position, then TFs can be resumed.  Abdominal binder in room and RN to replace to minimize risk of further removal.  No further surgical needs.  Call with questions.  Henreitta Cea 4:49 PM 12/03/2021

## 2021-12-03 NOTE — Progress Notes (Signed)
Anthony Dudley  FUX:[id number] DOB: [birth year] DOA: 11/03/2021 PCP: [name], MD    Brief Narrative:  [phone] with a history of dementia, legal blindness, FTT, and metastatic prostate cancer with bilateral hydronephrosis who was originally admitted 12/21 with septic shock related to UTI.  During his hospital stay he underwent a suprapubic catheter placement as well as B ureteral stent exchange on 12/29.  His postoperative stay was complicated by dysphagia which has limited his oral intake significantly.  On 1/10 he underwent PEG placement complicated by bradycardia hypotension and AMS postoperatively.  He required intubation pressors and IV fluids in the ICU.  He transiently required TPN and has subsequently transitioned to tube feeds.  He has since improved, been extubated, and passed a swallowing study for intake of liquids only.   Significant Events:  12/21 Admit with urosepsis, septic shock  12/25 to [facility]  12/29 Cystoscopy with B ureteral stent exchange and suprapubic catheter placement  1/03 [facility] consulted for PEG due to persistent dysphagia and poor intake  1/07 PICC placed for TPN  1/10 PEG placement per [facility] - postop hypotension and bradycardia - intubated by anesthesia 1/11 Extubated  1/13 Goals of care with [place] brother - full code - declined palliative care consult 1/14 Continues on Levophed at 2 mcg - difficult to wean down further 11/15 Levophed changed to Neo-Synephrine - persistent bradycardia - stop TPN > tube feeds 1/16 Off levophed, passed swallow evaluation for full liquids  1/18 PICC line removed    Consultants:  PCCM General Surgery Urology  Code Status: FULL CODE  Antimicrobials:  None presently  DVT prophylaxis: Lovenox   Interim Hx: Temperature elevated to 101 at 5 PM yesterday.  Blood pressure overall trending upward with hydration with systolics approximately 270.  Saturation 100% room air.  Mild hypokalemia noted.  Renal function slightly worse today.   WBC climbing.  Hemoglobin stable.  Patient pulled out his PEG tube this morning.  General surgery fortunately was able to replace his tube without significant difficulty with no apparent complications.  At the time of my visit the patient appears to be resting comfortably in bed.  He does not interact with me or answer my questions but he does open his eyes and follow me around the room.  He does not appear to be an extremis.  There is no evidence of uncontrolled pain or respiratory distress.  Assessment & Plan:  Pyelonephritis w/ Sepsis and Septic Shock Now off pressors and has completed antibiotic therapy - continue midodrine - indefinite suprapubic catheter with management per Urology - stopped stress dose steroids 1/18 -continue to gently hydrate  Acute hypoxic respiratory failure Felt to be due to sedation from procedure - required very brief ventilator support 1/10-1/11 - saturations now stable  Aspiration pneumonitis Completed 5 days of Unasyn -no evidence of persisting pneumonitis  Acute metabolic encephalopathy superimposed on dementia - failure to thrive HCPOA has been resistant to goals of care discussions and insists on full care/full code - disposition will be SNF placement   Persisting bradycardia with hypotension Has been evaluated previously by Cardiology and deemed not to be a candidate for pacemaker placement -hypotension appears to be improving with simple volume resuscitation -presently trending towards tachycardia rather than bradycardia  Metastatic prostate cancer Known widespread sclerotic bone metastases - being tx by Urology w/ ADT/enzalutamide  AKI on CKD baseline creatinine 2.2 - creatinine 6.08 at time of admission 11/03/2021 -creatinine appears to have reached a nadir at approximately 1.5-1.6 -continue to monitor  trend  Recent Labs  Lab 11/28/21 1214 11/29/21 0432 11/30/21 0415 12/01/21 0500 12/03/21 0406  CREATININE 1.51* 1.58* 1.63* 1.48* 1.62*     Transaminitis  Etiology not clear - avoid meds which may cause or exacerbate this - LFTs now improving  Recent Labs  Lab 11/28/21 1214 11/29/21 0432 12/03/21 0406  AST 130* 113* 27  ALT 95* 110* 61*  ALKPHOS 327* 308* 226*  BILITOT 0.4 0.4 0.2*  PROT 5.9* 5.9* 5.6*  ALBUMIN 2.2* 2.2* 2.3*      Dysphagia SLP following w/ re-eval on 1/18 - not yet ready to advance beyond full liquid diet   Long standing chronic urinary retention Now has suprapubic cath   Moderate malnutrition in context of chronic illness Continue tube feed support in addition to full liquid oral diet  Hyperglycemia Steroid induced - no longer an issue- pt is not diabetic - stop CBG checks   Acute on chronic anemia - macrocytosis  Transfuse as necessary when hemoglobin drops below 7  Decubitus ulcers-stage I toes, unstageable sacrum, stage II lumbar spine, DTI to the right heel WOC following   Family Communication: No family present at time of exam Disposition: Return to SNF when medically stable  Objective: Blood pressure 90/63, pulse 85, temperature 99.1 F (37.3 C), temperature source Oral, resp. rate 16, height 5\' 9"  (1.753 m), weight 89 kg, SpO2 100 %.  Intake/Output Summary (Last 24 hours) at 12/03/2021 0913 Last data filed at 12/03/2021 0900 Gross per 24 hour  Intake 11047.86 ml  Output 3350 ml  Net 7697.86 ml    Filed Weights   12/01/21 0500 12/02/21 0339 12/03/21 0500  Weight: 91 kg 87 kg 89 kg    Examination: General: No acute respiratory distress Lungs: Clear to auscultation bilaterally -no wheezing Cardiovascular: RRR Abdomen: Nontender, nondistended, soft, bowel sounds positive, no rebound, no appreciable mass - foley cath tube and suprapubic catheter appreciated Extremities: No significant cyanosis, clubbing, or edema B LE   CBC: Recent Labs  Lab 11/30/21 0415 12/01/21 0500 12/03/21 0406  WBC 12.0* 13.7* 21.0*  HGB 7.6* 7.6* 7.9*  HCT 24.8* 24.6* 26.0*  MCV  104.6* 103.8* 107.0*  PLT 249 257 096    Basic Metabolic Panel: Recent Labs  Lab 11/27/21 0500 11/28/21 1214 11/29/21 0432 11/30/21 0415 12/01/21 0500 12/03/21 0406  NA 141 140 141 140 137 138  K 4.6 4.1 4.2 4.3 5.0 3.3*  CL 112* 112* 113* 113* 109 113*  CO2 22 22 21* 22 22 18*  GLUCOSE 146* 167* 124* 131* 107* 107*  BUN 66* 64* 67* 63* 66* 58*  CREATININE 1.44* 1.51* 1.58* 1.63* 1.48* 1.62*  CALCIUM 8.0* 7.7* 7.9* 7.9* 8.1* 7.6*  MG 2.1 2.0 1.8  --   --   --   PHOS  --  4.0 3.8  --   --   --     GFR: Estimated Creatinine Clearance: 35.4 mL/min (A) (by C-G formula based on SCr of 1.62 mg/dL (H)).  Liver Function Tests: Recent Labs  Lab 11/28/21 1214 11/29/21 0432 12/03/21 0406  AST 130* 113* 27  ALT 95* 110* 61*  ALKPHOS 327* 308* 226*  BILITOT 0.4 0.4 0.2*  PROT 5.9* 5.9* 5.6*  ALBUMIN 2.2* 2.2* 2.3*     HbA1C: Hgb A1c MFr Bld  Date/Time Value Ref Range Status  11/27/2021 02:50 PM 5.7 (H) 4.8 - 5.6 % Final    Comment:    (NOTE) Pre diabetes:  5.7%-6.4%  Diabetes:              >6.4%  Glycemic control for   <7.0% adults with diabetes   09/07/2021 04:06 AM 6.1 (H) 4.8 - 5.6 % Final    Comment:    (NOTE) Pre diabetes:          5.7%-6.4%  Diabetes:              >6.4%  Glycemic control for   <7.0% adults with diabetes     CBG: Recent Labs  Lab 12/01/21 1631 12/01/21 1930 12/01/21 2347 12/02/21 0332 12/02/21 0749  GLUCAP 114* 96 96 77 80     Scheduled Meds:  chlorhexidine  15 mL Mouth Rinse BID   Chlorhexidine Gluconate Cloth  6 each Topical Daily   collagenase   Topical Daily   enoxaparin (LOVENOX) injection  40 mg Subcutaneous Daily   feeding supplement  237 mL Oral BID BM   feeding supplement (OSMOLITE 1.5 CAL)  1,000 mL Per Tube Q24H   feeding supplement (PROSource TF)  45 mL Per Tube TID   free water  300 mL Per Tube Q4H   mouth rinse  15 mL Mouth Rinse q12n4p   midodrine  10 mg Per Tube TID with meals   mirtazapine   7.5 mg Per Tube QHS   multivitamin  15 mL Per Tube Daily   pantoprazole sodium  40 mg Per Tube Daily   sodium chloride flush  10-40 mL Intracatheter Q12H     LOS: 30 days   Cherene Altes, MD [facility] Office  [phone] Pager - Text Page per Shea Evans  If 7PM-7AM, please contact night-coverage per Amion 12/03/2021, 9:13 AM

## 2021-12-03 NOTE — Progress Notes (Signed)
This RN hasn't resumed tube feedings due to DG abdomen of PEG tube placement results and will need further evaluation to confirm if its safe to continue with TF. NP Blount made aware. Will continue to pt.

## 2021-12-03 NOTE — Progress Notes (Signed)
Spoke w/ on call PA for general surgery. Planning to come up here and replace PEG tube by end of shift. No meds that need to be given via PEG tube given today. Will continue to monitor patient and wait for general surgery to come and replace PEG tube.

## 2021-12-03 NOTE — TOC Progression Note (Signed)
Transition of Care [facility]) - Progression Note    Patient Details  Name: Anthony Dudley MRN: [id number] Date of Birth: Oct 24, 1933  Transition of Care [facility]) CM/SW Contact  Uziel Covault, Juliann Pulse, RN Phone Number: 12/03/2021, 11:44 AM  Clinical Narrative: Confirmed w/[employer] rep Tracey-patient LTC bed is held for return-has paid till end of month 12/14/21.      Expected Discharge Plan: Long Term Nursing Home Barriers to Discharge: Continued Medical Work up  Expected Discharge Plan and Services Expected Discharge Plan: [place]                                               Social Determinants of Health (SDOH) Interventions    Readmission Risk Interventions No flowsheet data found.

## 2021-12-03 NOTE — Progress Notes (Signed)
Patient ID: Anthony Dudley, male   DOB: October 22, 1933, 88 y.o.   MRN: [id number] [facility] Surgery Progress Note  10 Days Post-Op  Subjective: CC-  Called to see patient after he pulled PEG out this morning. This was placed by Dr. Kieth Brightly on 11/23/21.  Objective: Vital signs in last 24 hours: Temp:  [98.1 F (36.7 C)-101 F (38.3 C)] 99.1 F (37.3 C) (01/20 0642) Pulse Rate:  [77-110] 85 (01/20 0343) Resp:  [15-38] 16 (01/20 0642) BP: (90-117)/(34-63) 90/63 (01/20 0642) SpO2:  [95 %-100 %] 100 % (01/20 6073) Weight:  [89 kg] 89 kg (01/20 0500) Last BM Date: 12/02/21  Intake/Output from previous day: 01/19 0701 - 01/20 0700 In: 11047.9 [P.O.:60; I.V.:1566.8; NG/GT:9361.1] Out: 3350 [Urine:2650; Stool:700] Intake/Output this shift: No intake/output data recorded.  PE: Abd: soft, ND, NT, foley inserted into site of previous PEG  Lab Results:  Recent Labs    12/01/21 0500 12/03/21 0406  WBC 13.7* 21.0*  HGB 7.6* 7.9*  HCT 24.6* 26.0*  PLT 257 251   BMET Recent Labs    12/01/21 0500 12/03/21 0406  NA 137 138  K 5.0 3.3*  CL 109 113*  CO2 22 18*  GLUCOSE 107* 107*  BUN 66* 58*  CREATININE 1.48* 1.62*  CALCIUM 8.1* 7.6*   PT/INR No results for input(s): LABPROT, INR in the last 72 hours. CMP     Component Value Date/Time   NA 138 12/03/2021 0406   K 3.3 (L) 12/03/2021 0406   CL 113 (H) 12/03/2021 0406   CO2 18 (L) 12/03/2021 0406   GLUCOSE 107 (H) 12/03/2021 0406   BUN 58 (H) 12/03/2021 0406   CREATININE 1.62 (H) 12/03/2021 0406   CALCIUM 7.6 (L) 12/03/2021 0406   PROT 5.6 (L) 12/03/2021 0406   ALBUMIN 2.3 (L) 12/03/2021 0406   AST 27 12/03/2021 0406   ALT 61 (H) 12/03/2021 0406   ALKPHOS 226 (H) 12/03/2021 0406   BILITOT 0.2 (L) 12/03/2021 0406   GFRNONAA 41 (L) 12/03/2021 0406   Lipase  No results found for: LIPASE     Studies/Results: No results found.  Anti-infectives: Anti-infectives (From admission, onward)    Start      Dose/Rate Route Frequency Ordered Stop   11/24/21 0015  Ampicillin-Sulbactam (UNASYN) 3 g in sodium chloride 0.9 % 100 mL IVPB        3 g 200 mL/hr over 30 Minutes Intravenous Every 8 hours 11/23/21 2326 11/28/21 1749   11/19/21 1400  vancomycin (VANCOREADY) IVPB 1000 mg/200 mL  Status:  Discontinued        1,000 mg 200 mL/hr over 60 Minutes Intravenous Daily 11/18/21 1417 11/19/21 1146   11/17/21 1830  piperacillin-tazobactam (ZOSYN) IVPB 3.375 g  Status:  Discontinued        3.375 g 12.5 mL/hr over 240 Minutes Intravenous Every 8 hours 11/17/21 1810 11/19/21 1628   11/17/21 1830  vancomycin (VANCOREADY) IVPB 1000 mg/200 mL  Status:  Discontinued        1,000 mg 200 mL/hr over 60 Minutes Intravenous Daily 11/17/21 1810 11/18/21 1417   11/17/21 0600  ceFAZolin (ANCEF) IVPB 2g/100 mL premix        2 g 200 mL/hr over 30 Minutes Intravenous On call to O.R. 11/16/21 1226 11/18/21 0559   11/11/21 0700  cefTRIAXone (ROCEPHIN) 2 g in sodium chloride 0.9 % 100 mL IVPB        2 g 200 mL/hr over 30 Minutes Intravenous On call 11/11/21 0654 11/11/21 0957  11/10/21 2200  vancomycin (VANCOREADY) IVPB 1250 mg/250 mL  Status:  Discontinued        1,250 mg 166.7 mL/hr over 90 Minutes Intravenous Every 48 hours 11/08/21 1934 11/10/21 1147   11/09/21 1830  piperacillin-tazobactam (ZOSYN) IVPB 3.375 g  Status:  Discontinued        3.375 g 12.5 mL/hr over 240 Minutes Intravenous Every 8 hours 11/09/21 1730 11/15/21 1105   11/08/21 2015  vancomycin (VANCOREADY) IVPB 1500 mg/300 mL        1,500 mg 150 mL/hr over 120 Minutes Intravenous  Once 11/08/21 1927 11/09/21 0700   11/08/21 2000  metroNIDAZOLE (FLAGYL) IVPB 500 mg  Status:  Discontinued        500 mg 100 mL/hr over 60 Minutes Intravenous Every 12 hours 11/08/21 1908 11/09/21 1719   11/05/21 1700  ceFEPIme (MAXIPIME) 2 g in sodium chloride 0.9 % 100 mL IVPB  Status:  Discontinued        2 g 200 mL/hr over 30 Minutes Intravenous Every 24 hours  11/05/21 1026 11/09/21 1718   11/04/21 1700  ceFEPIme (MAXIPIME) 1 g in sodium chloride 0.9 % 100 mL IVPB  Status:  Discontinued        1 g 200 mL/hr over 30 Minutes Intravenous Every 24 hours 11/03/21 2034 11/05/21 1026   11/04/21 1132  vancomycin variable dose per unstable renal function (pharmacist dosing)  Status:  Discontinued         Does not apply See admin instructions 11/04/21 1132 11/05/21 0954   11/03/21 1530  ceFEPIme (MAXIPIME) 2 g in sodium chloride 0.9 % 100 mL IVPB        2 g 200 mL/hr over 30 Minutes Intravenous  Once 11/03/21 1518 11/03/21 2000   11/03/21 1530  metroNIDAZOLE (FLAGYL) IVPB 500 mg        500 mg 100 mL/hr over 60 Minutes Intravenous  Once 11/03/21 1518 11/03/21 2000   11/03/21 1530  vancomycin (VANCOCIN) IVPB 1000 mg/200 mL premix        1,000 mg 200 mL/hr over 60 Minutes Intravenous  Once 11/03/21 1518 11/03/21 2000        Assessment/Plan Prostate cancer metastatic to multiple sites Dysphagia Failure to thrive S/p PEG placement 1/10  - Patient pulled PEG out this morning. I replaced with a foley. Will ask xray to inject contrast and confirm good position. Do not use this tube for medications or TF yet. Patient did not have abdominal binder on - discussed with floor the importance of keeping this on at all times to protect G tube.   LOS: 30 days    [place] [facility] 12/03/2021, 9:47 AM Please see Amion for pager number during day hours 7:00am-4:30pm

## 2021-12-03 NOTE — Plan of Care (Signed)
°  Problem: Activity: Goal: Risk for activity intolerance will decrease Outcome: Progressing   Problem: Safety: Goal: Ability to remain free from injury will improve Outcome: Progressing   Problem: Education: Goal: Knowledge of General Education information will improve Description: Including pain rating scale, medication(s)/side effects and non-pharmacologic comfort measures Outcome: Not Progressing   Problem: Nutrition: Goal: Adequate nutrition will be maintained Outcome: Not Progressing

## 2021-12-04 LAB — BASIC METABOLIC PANEL
Anion gap: 8 (ref 5–15)
BUN: 40 mg/dL — ABNORMAL HIGH (ref 8–23)
CO2: 17 mmol/L — ABNORMAL LOW (ref 22–32)
Calcium: 7.8 mg/dL — ABNORMAL LOW (ref 8.9–10.3)
Chloride: 115 mmol/L — ABNORMAL HIGH (ref 98–111)
Creatinine, Ser: 1.58 mg/dL — ABNORMAL HIGH (ref 0.61–1.24)
GFR, Estimated: 42 mL/min — ABNORMAL LOW (ref >60)
Glucose, Bld: 78 mg/dL (ref 70–99)
Potassium: 3.5 mmol/L (ref 3.5–5.1)
Sodium: 140 mmol/L (ref 135–145)

## 2021-12-04 LAB — GLUCOSE, CAPILLARY: Glucose-Capillary: 74 mg/dL (ref 70–99)

## 2021-12-04 LAB — CBC
HCT: 25.1 % — ABNORMAL LOW (ref 39.0–52.0)
Hemoglobin: 7.7 g/dL — ABNORMAL LOW (ref 13.0–17.0)
MCH: 32.6 pg (ref 26.0–34.0)
MCHC: 30.7 g/dL (ref 30.0–36.0)
MCV: 106.4 fL — ABNORMAL HIGH (ref 80.0–100.0)
Platelets: 226 10*3/uL (ref 150–400)
RBC: 2.36 MIL/uL — ABNORMAL LOW (ref 4.22–5.81)
RDW: 21.4 % — ABNORMAL HIGH (ref 11.5–15.5)
WBC: 16.1 10*3/uL — ABNORMAL HIGH (ref 4.0–10.5)
nRBC: 0 % (ref 0.0–0.2)

## 2021-12-04 MED ORDER — MIDODRINE HCL 5 MG PO TABS
5.0000 mg | ORAL_TABLET | Freq: Three times a day (TID) | ORAL | Status: DC
  Administered 2021-12-04 – 2021-12-05 (×3): 5 mg
  Filled 2021-12-04 (×3): qty 1

## 2021-12-04 NOTE — Progress Notes (Addendum)
Anthony Dudley  XNT:[id number] DOB: 11/21/1932 DOA: 11/03/2021 PCP: [name], MD    Brief Narrative:  88yo with a history of dementia, legal blindness, FTT, and metastatic prostate cancer with B hydronephrosis who was admitted 12/21 with septic shock related to UTI.  During his hospital stay he underwent suprapubic catheter placement as well as B ureteral stent exchange on 12/29.  His postoperative stay was complicated by dysphagia which has limited his oral intake significantly.  On 1/10 he underwent PEG placement complicated by bradycardia hypotension and AMS postoperatively.  He required intubation pressors and IV fluids in the ICU.  He transiently required TPN, but has subsequently transitioned to tube feeds.  He has since improved, been extubated, and passed a swallowing study for intake of liquids only.   Significant Events:  12/21 Admit with urosepsis, septic shock  12/25 to [facility]  12/29 Cystoscopy with B ureteral stent exchange and suprapubic catheter placement  1/03 CCS consulted for PEG due to persistent dysphagia and poor intake  1/07 PICC placed for TPN  1/10 PEG placement per CCS - postop hypotension and bradycardia - intubated by anesthesia 1/11 Extubated  1/13 Goals of care with [place] brother - full code - declined palliative care consult 1/14 Continues on Levophed at 2 mcg - difficult to wean down further 11/15 Levophed changed to Neo-Synephrine - persistent bradycardia - stop TPN > tube feeds 1/16 Off levophed, passed swallow evaluation for full liquids  1/18 PICC line removed    Consultants:  PCCM General Surgery Urology  Code Status: FULL CODE  Antimicrobials:  None presently  DVT prophylaxis: Lovenox   Interim Hx: G-tube successfully replaced by General Surgery yesterday.  Afebrile.  Vital signs stable.  Question of ileus noted on follow-up KUB yesterday evening.  Appears to be resting comfortably in bed.  No appreciable change in mental state or  responsiveness.  Does not appear to be uncomfortable.  Is not in respiratory distress.  Abdomen is not distended and is soft with bowel sounds appreciable.  Assessment & Plan:  Pyelonephritis w/ Sepsis and Septic Shock off pressors and has completed antibiotic therapy - indefinite suprapubic catheter with management per Urology - stopped stress dose steroids 1/18 - continue to gently hydrate and wean midodrine as able   Acute hypoxic respiratory failure Felt to be due to sedation from procedure - required very brief ventilator support 1/10-1/11 - saturations now stable  Aspiration pneumonitis Completed 5 days of Unasyn - no evidence of persisting pneumonitis  Acute metabolic encephalopathy superimposed on dementia - failure to thrive HCPOA has been resistant to goals of care discussions and insists on full care/full code per prior reports - disposition will be SNF placement - improvement in mental status beyond his current state is not expected   Persisting bradycardia with hypotension Has been evaluated previously by Cardiology and deemed not to be a candidate for pacemaker placement - hypotension appears to have resolved with simple volume resuscitation - presently trending towards tachycardia rather than bradycardia  Metastatic prostate cancer Known widespread sclerotic bone metastases - being tx by Urology w/ ADT/enzalutamide  AKI on CKD baseline creatinine 2.2 - creatinine 6.08 at time of admission 11/03/2021 -creatinine appears to have reached a nadir at approximately 1.5-1.6 -continue to monitor trend  Recent Labs  Lab 11/29/21 0432 11/30/21 0415 12/01/21 0500 12/03/21 0406 12/04/21 0431  CREATININE 1.58* 1.63* 1.48* 1.62* 1.58*    Transaminitis  Etiology not clear - avoid meds which may cause or exacerbate this - LFTs  now essentially normalized  Recent Labs  Lab 11/28/21 1214 11/29/21 0432 12/03/21 0406  AST 130* 113* 27  ALT 95* 110* 61*  ALKPHOS 327* 308* 226*   BILITOT 0.4 0.4 0.2*  PROT 5.9* 5.9* 5.6*  ALBUMIN 2.2* 2.2* 2.3*     Dysphagia SLP following w/ re-eval on 1/18 - not yet ready to advance beyond full liquid diet   Long standing chronic urinary retention Now has suprapubic cath   Moderate malnutrition in context of chronic illness Continue tube feed support via PEG in addition to full liquid oral diet  Possible ileus Noted on follow-up KUB to reconfirm G-tube position - no clinical evidence of this w/ normal exam and stool in fecal containment pouch - resume tube feeding and follow aspiration precautions   Hyperglycemia Steroid induced - no longer an issue- pt is not diabetic - stopped CBG checks   Acute on chronic anemia - macrocytosis  Transfuse as necessary when hemoglobin drops below 7  Decubitus ulcers-stage I toes, unstageable sacrum, stage II lumbar spine, DTI to the right heel WOC following   Family Communication: No family present at time of exam Disposition: Return to SNF when medically stable - hoping will be ready for d/c 12/06/21  Objective: Blood pressure 114/60, pulse 82, temperature 98.2 F (36.8 C), temperature source Oral, resp. rate 18, height 5\' 9"  (1.753 m), weight 88.2 kg, SpO2 94 %.  Intake/Output Summary (Last 24 hours) at 12/04/2021 0943 Last data filed at 12/04/2021 0500 Gross per 24 hour  Intake 860 ml  Output 4350 ml  Net -3490 ml    Filed Weights   12/02/21 0339 12/03/21 0500 12/04/21 0500  Weight: 87 kg 89 kg 88.2 kg    Examination: General: No acute respiratory distress - awakens to voice - minimal interaction - no extremis Lungs: Clear to auscultation bilaterally w/oo wheezing Cardiovascular: RRR Abdomen: Nontender, nondistended, soft, bowel sounds positive, no rebound, no appreciable mass - Gtube and suprapubic catheter in place and unremarkable  Extremities: No significant cyanosis, clubbing, or edema B LE   CBC: Recent Labs  Lab 12/01/21 0500 12/03/21 0406 12/04/21 0431   WBC 13.7* 21.0* 16.1*  HGB 7.6* 7.9* 7.7*  HCT 24.6* 26.0* 25.1*  MCV 103.8* 107.0* 106.4*  PLT 257 251 956    Basic Metabolic Panel: Recent Labs  Lab 11/28/21 1214 11/29/21 0432 11/30/21 0415 12/01/21 0500 12/03/21 0406 12/04/21 0431  NA 140 141   < > 137 138 140  K 4.1 4.2   < > 5.0 3.3* 3.5  CL 112* 113*   < > 109 113* 115*  CO2 22 21*   < > 22 18* 17*  GLUCOSE 167* 124*   < > 107* 107* 78  BUN 64* 67*   < > 66* 58* 40*  CREATININE 1.51* 1.58*   < > 1.48* 1.62* 1.58*  CALCIUM 7.7* 7.9*   < > 8.1* 7.6* 7.8*  MG 2.0 1.8  --   --   --   --   PHOS 4.0 3.8  --   --   --   --    < > = values in this interval not displayed.    GFR: Estimated Creatinine Clearance: 36.2 mL/min (A) (by C-G formula based on SCr of 1.58 mg/dL (H)).  Liver Function Tests: Recent Labs  Lab 11/28/21 1214 11/29/21 0432 12/03/21 0406  AST 130* 113* 27  ALT 95* 110* 61*  ALKPHOS 327* 308* 226*  BILITOT 0.4 0.4 0.2*  PROT  5.9* 5.9* 5.6*  ALBUMIN 2.2* 2.2* 2.3*     HbA1C: Hgb A1c MFr Bld  Date/Time Value Ref Range Status  11/27/2021 02:50 PM 5.7 (H) 4.8 - 5.6 % Final    Comment:    (NOTE) Pre diabetes:          5.7%-6.4%  Diabetes:              >6.4%  Glycemic control for   <7.0% adults with diabetes   09/07/2021 04:06 AM 6.1 (H) 4.8 - 5.6 % Final    Comment:    (NOTE) Pre diabetes:          5.7%-6.4%  Diabetes:              >6.4%  Glycemic control for   <7.0% adults with diabetes     CBG: Recent Labs  Lab 12/01/21 2347 12/02/21 0332 12/02/21 0749 12/03/21 2153 12/04/21 0531  GLUCAP 96 77 80 70 74     Scheduled Meds:  chlorhexidine  15 mL Mouth Rinse BID   Chlorhexidine Gluconate Cloth  6 each Topical Daily   collagenase   Topical Daily   enoxaparin (LOVENOX) injection  40 mg Subcutaneous Daily   feeding supplement  237 mL Oral BID BM   feeding supplement (OSMOLITE 1.5 CAL)  1,000 mL Per Tube Q24H   feeding supplement (PROSource TF)  45 mL Per Tube TID    free water  300 mL Per Tube Q4H   mouth rinse  15 mL Mouth Rinse q12n4p   midodrine  10 mg Per Tube TID with meals   mirtazapine  7.5 mg Per Tube QHS   multivitamin  15 mL Per Tube Daily   pantoprazole sodium  40 mg Per Tube Daily   sodium chloride flush  10-40 mL Intracatheter Q12H     LOS: 31 days   Cherene Altes, MD [facility] Office  [phone] Pager - Text Page per Shea Evans  If 7PM-7AM, please contact night-coverage per Amion 12/04/2021, 9:43 AM

## 2021-12-05 MED ORDER — MIDODRINE HCL 5 MG PO TABS
2.5000 mg | ORAL_TABLET | Freq: Three times a day (TID) | ORAL | Status: DC
  Administered 2021-12-05 – 2021-12-06 (×4): 2.5 mg
  Filled 2021-12-05 (×5): qty 1

## 2021-12-05 NOTE — Progress Notes (Signed)
Anthony Dudley  SNK:[id number] DOB: April 21, 1933 DOA: 11/03/2021 PCP: [name], MD    Brief Narrative:  88yo with a history of dementia, legal blindness, FTT, and metastatic prostate cancer with B hydronephrosis who was admitted 12/21 with septic shock related to UTI.  During his hospital stay he underwent suprapubic catheter placement as well as B ureteral stent exchange on 12/29.  His postoperative stay was complicated by dysphagia which has limited his oral intake significantly.  On 1/10 he underwent PEG placement complicated by bradycardia hypotension and AMS postoperatively.  He required intubation pressors and IV fluids in the ICU.  He transiently required TPN, but has subsequently transitioned to tube feeds.  He has since improved, been extubated, and passed a swallowing study for intake of liquids only.   Significant Events:  12/21 Admit with urosepsis, septic shock  12/25 to [facility]  12/29 Cystoscopy with B ureteral stent exchange and suprapubic catheter placement  1/03 [facility] consulted for PEG due to persistent dysphagia and poor intake  1/07 PICC placed for TPN  1/10 PEG placement per [facility] - postop hypotension and bradycardia - intubated by anesthesia 1/11 Extubated  1/13 Goals of care with [place] brother - full code - declined palliative care consult 1/14 Continues on Levophed at 2 mcg - difficult to wean down further 11/15 Levophed changed to Neo-Synephrine - persistent bradycardia - stop TPN > tube feeds 1/16 Off levophed, passed swallow evaluation for full liquids  1/18 PICC line removed    Consultants:  PCCM General Surgery Urology  Code Status: FULL CODE  Antimicrobials:  None presently  DVT prophylaxis: Lovenox   Interim Hx: Afebrile.  Vitals are stable.  Saturation 96% room air.  Tolerating tube feeding without difficulty.  No acute events reported overnight.  No new labs today.  Resting quietly in bed with no evidence of distress or discomfort.  Assessment &  Plan:  Pyelonephritis w/ Sepsis and Septic Shock - resolved  Required pressors initially - has completed antibiotic therapy - indefinite suprapubic catheter with management per Urology - stopped stress dose steroids 1/18 - stop IVF today - wean midodrine to off   Acute hypoxic respiratory failure Felt to be due to sedation from procedure - required very brief ventilator support 1/10-1/11 - saturations now stable on RA  Aspiration pneumonitis Completed 5 days of Unasyn - no evidence of persisting pneumonitis  Acute metabolic encephalopathy superimposed on dementia - failure to thrive HCPOA has been resistant to goals of care discussions and insists on full care/full code per prior reports - disposition will be SNF placement - improvement in mental status beyond his current state is not expected   Persisting bradycardia with hypotension Has been evaluated previously by Cardiology and deemed not to be a candidate for pacemaker placement - hypotension appears to have resolved with simple volume resuscitation - HR presently stable at 67-80  Metastatic prostate cancer Known widespread sclerotic bone metastases - being tx by Urology w/ ADT/enzalutamide  AKI on CKD baseline creatinine 2.2 - creatinine 6.08 at time of admission 11/03/2021 -creatinine appears to have reached a nadir at approximately 1.5-1.6 and has stabilized   Recent Labs  Lab 11/29/21 0432 11/30/21 0415 12/01/21 0500 12/03/21 0406 12/04/21 0431  CREATININE 1.58* 1.63* 1.48* 1.62* 1.58*    Transaminitis  Etiology not clear - avoid meds which may cause or exacerbate this - LFTs now essentially normalized  Recent Labs  Lab 11/28/21 1214 11/29/21 0432 12/03/21 0406  AST 130* 113* 27  ALT 95* 110*  61*  ALKPHOS 327* 308* 226*  BILITOT 0.4 0.4 0.2*  PROT 5.9* 5.9* 5.6*  ALBUMIN 2.2* 2.2* 2.3*     Dysphagia SLP following w/ re-eval on 1/18 - not yet ready to advance beyond full liquid diet   Long standing chronic  urinary retention Now has suprapubic cath   Moderate malnutrition in context of chronic illness Continue tube feed support via PEG in addition to full liquid oral diet  Possible ileus - ruled out clinically  Noted on follow-up KUB to reconfirm G-tube position - no clinical evidence of this w/ normal exam and stool in fecal containment pouch - resumed tube feeding and follow aspiration precautions   Hyperglycemia - resolved  Steroid induced - no longer an issue - pt is not diabetic - stopped CBG checks   Acute on chronic anemia - macrocytosis  Transfuse as necessary when hemoglobin drops below 7  Decubitus ulcers-stage I toes, unstageable sacrum, stage II lumbar spine, DTI to the right heel WOC following   Family Communication: No family present at time of exam Disposition: Return to SNF when medically stable - anticipate d/c 12/06/21  Objective: Blood pressure 110/65, pulse 89, temperature 97.9 F (36.6 C), temperature source Oral, resp. rate 19, height 5\' 9"  (1.753 m), weight 87 kg, SpO2 96 %.  Intake/Output Summary (Last 24 hours) at 12/05/2021 0918 Last data filed at 12/05/2021 8527 Gross per 24 hour  Intake 5322.15 ml  Output 2300 ml  Net 3022.15 ml    Filed Weights   12/03/21 0500 12/04/21 0500 12/05/21 0500  Weight: 89 kg 88.2 kg 87 kg    Examination: General: No acute respiratory distress  Lungs: Clear to auscultation bilaterally w/oo wheezing Cardiovascular: RRR Abdomen: Nontender, nondistended, soft, bowel sounds positive, no rebound, no appreciable mass - Gtube and suprapubic catheter in place and unremarkable  Extremities: No edema B LE   CBC: Recent Labs  Lab 12/01/21 0500 12/03/21 0406 12/04/21 0431  WBC 13.7* 21.0* 16.1*  HGB 7.6* 7.9* 7.7*  HCT 24.6* 26.0* 25.1*  MCV 103.8* 107.0* 106.4*  PLT 257 251 782    Basic Metabolic Panel: Recent Labs  Lab 11/28/21 1214 11/29/21 0432 11/30/21 0415 12/01/21 0500 12/03/21 0406 12/04/21 0431  NA 140  141   < > 137 138 140  K 4.1 4.2   < > 5.0 3.3* 3.5  CL 112* 113*   < > 109 113* 115*  CO2 22 21*   < > 22 18* 17*  GLUCOSE 167* 124*   < > 107* 107* 78  BUN 64* 67*   < > 66* 58* 40*  CREATININE 1.51* 1.58*   < > 1.48* 1.62* 1.58*  CALCIUM 7.7* 7.9*   < > 8.1* 7.6* 7.8*  MG 2.0 1.8  --   --   --   --   PHOS 4.0 3.8  --   --   --   --    < > = values in this interval not displayed.    GFR: Estimated Creatinine Clearance: 36 mL/min (A) (by C-G formula based on SCr of 1.58 mg/dL (H)).  Liver Function Tests: Recent Labs  Lab 11/28/21 1214 11/29/21 0432 12/03/21 0406  AST 130* 113* 27  ALT 95* 110* 61*  ALKPHOS 327* 308* 226*  BILITOT 0.4 0.4 0.2*  PROT 5.9* 5.9* 5.6*  ALBUMIN 2.2* 2.2* 2.3*     HbA1C: Hgb A1c MFr Bld  Date/Time Value Ref Range Status  11/27/2021 02:50 PM 5.7 (H) 4.8 -  5.6 % Final    Comment:    (NOTE) Pre diabetes:          5.7%-6.4%  Diabetes:              >6.4%  Glycemic control for   <7.0% adults with diabetes   09/07/2021 04:06 AM 6.1 (H) 4.8 - 5.6 % Final    Comment:    (NOTE) Pre diabetes:          5.7%-6.4%  Diabetes:              >6.4%  Glycemic control for   <7.0% adults with diabetes     CBG: Recent Labs  Lab 12/01/21 2347 12/02/21 0332 12/02/21 0749 12/03/21 2153 12/04/21 0531  GLUCAP 96 77 80 70 74     Scheduled Meds:  chlorhexidine  15 mL Mouth Rinse BID   Chlorhexidine Gluconate Cloth  6 each Topical Daily   collagenase   Topical Daily   enoxaparin (LOVENOX) injection  40 mg Subcutaneous Daily   feeding supplement  237 mL Oral BID BM   feeding supplement (OSMOLITE 1.5 CAL)  1,000 mL Per Tube Q24H   feeding supplement (PROSource TF)  45 mL Per Tube TID   free water  300 mL Per Tube Q4H   mouth rinse  15 mL Mouth Rinse q12n4p   midodrine  5 mg Per Tube TID with meals   mirtazapine  7.5 mg Per Tube QHS   multivitamin  15 mL Per Tube Daily   pantoprazole sodium  40 mg Per Tube Daily   sodium chloride flush   10-40 mL Intracatheter Q12H     LOS: 32 days   Cherene Altes, MD [facility] Office  [phone] Pager - Text Page per Shea Evans  If 7PM-7AM, please contact night-coverage per Amion 12/05/2021, 9:18 AM

## 2021-12-05 NOTE — TOC Progression Note (Signed)
Transition of Care [facility]) - Progression Note    Patient Details  Name: Anthony Dudley MRN: [id number] Date of Birth: Mar 21, 1933  Transition of Care [facility]) CM/SW Contact  Ross Ludwig, [place] Phone Number: 12/05/2021, 6:14 PM  Clinical Narrative:    CSW continuing to follow patient's progress throughout discharge planning.   Expected Discharge Plan: Long Term Nursing Home Barriers to Discharge: Continued Medical Work up  Expected Discharge Plan and Services Expected Discharge Plan: [place]                                               Social Determinants of Health (SDOH) Interventions    Readmission Risk Interventions No flowsheet data found.

## 2021-12-05 NOTE — Plan of Care (Signed)
  Problem: Nutrition: Goal: Adequate nutrition will be maintained Outcome: Progressing   

## 2021-12-06 LAB — RESP PANEL BY RT-PCR (FLU A&B, COVID) ARPGX2
Influenza A by PCR: NEGATIVE
Influenza B by PCR: NEGATIVE
SARS Coronavirus 2 by RT PCR: NEGATIVE

## 2021-12-06 LAB — CBC
HCT: 25.6 % — ABNORMAL LOW (ref 39.0–52.0)
Hemoglobin: 8.1 g/dL — ABNORMAL LOW (ref 13.0–17.0)
MCH: 32.7 pg (ref 26.0–34.0)
MCHC: 31.6 g/dL (ref 30.0–36.0)
MCV: 103.2 fL — ABNORMAL HIGH (ref 80.0–100.0)
Platelets: 273 10*3/uL (ref 150–400)
RBC: 2.48 MIL/uL — ABNORMAL LOW (ref 4.22–5.81)
RDW: 21 % — ABNORMAL HIGH (ref 11.5–15.5)
WBC: 9.9 10*3/uL (ref 4.0–10.5)
nRBC: 0 % (ref 0.0–0.2)

## 2021-12-06 LAB — COMPREHENSIVE METABOLIC PANEL
ALT: 34 U/L (ref 0–44)
AST: 18 U/L (ref 15–41)
Albumin: 2.4 g/dL — ABNORMAL LOW (ref 3.5–5.0)
Alkaline Phosphatase: 202 U/L — ABNORMAL HIGH (ref 38–126)
Anion gap: 8 (ref 5–15)
BUN: 38 mg/dL — ABNORMAL HIGH (ref 8–23)
CO2: 19 mmol/L — ABNORMAL LOW (ref 22–32)
Calcium: 8 mg/dL — ABNORMAL LOW (ref 8.9–10.3)
Chloride: 113 mmol/L — ABNORMAL HIGH (ref 98–111)
Creatinine, Ser: 1.44 mg/dL — ABNORMAL HIGH (ref 0.61–1.24)
GFR, Estimated: 47 mL/min — ABNORMAL LOW (ref >60)
Glucose, Bld: 111 mg/dL — ABNORMAL HIGH (ref 70–99)
Potassium: 3.6 mmol/L (ref 3.5–5.1)
Sodium: 140 mmol/L (ref 135–145)
Total Bilirubin: 0.4 mg/dL (ref 0.3–1.2)
Total Protein: 6.1 g/dL — ABNORMAL LOW (ref 6.5–8.1)

## 2021-12-06 LAB — GLUCOSE, CAPILLARY: Glucose-Capillary: 97 mg/dL (ref 70–99)

## 2021-12-06 MED ORDER — ENSURE ENLIVE PO LIQD: 237.0000 mL | ORAL | Status: DC

## 2021-12-06 MED ORDER — FREE WATER: 300.0000 mL | Status: AC

## 2021-12-06 MED ORDER — OSMOLITE 1.5 CAL PO LIQD: 1000.0000 mL | ORAL | 0 refills | Status: AC

## 2021-12-06 MED ORDER — MIRTAZAPINE 7.5 MG PO TABS: 7.5000 mg | ORAL_TABLET | Freq: Every day | ORAL | Status: AC

## 2021-12-06 MED ORDER — ADULT MULTIVITAMIN LIQUID CH
15.0000 mL | Freq: Every day | ORAL | Status: AC
Start: 2021-12-07 — End: ?

## 2021-12-06 MED ORDER — PANTOPRAZOLE SODIUM 40 MG PO PACK: 40.0000 mg | PACK | Freq: Every day | ORAL | Status: AC

## 2021-12-06 MED ORDER — MIDODRINE HCL 2.5 MG PO TABS: 2.5000 mg | ORAL_TABLET | Freq: Three times a day (TID) | ORAL | Status: AC

## 2021-12-06 MED ORDER — PROSOURCE TF PO LIQD: 45.0000 mL | Freq: Three times a day (TID) | ORAL | Status: AC

## 2021-12-06 MED ORDER — ZINC OXIDE 40 % EX OINT: TOPICAL_OINTMENT | CUTANEOUS | 0 refills | Status: AC | PRN

## 2021-12-06 MED ORDER — COLLAGENASE 250 UNIT/GM EX OINT: TOPICAL_OINTMENT | Freq: Every day | CUTANEOUS | 0 refills | Status: AC

## 2021-12-06 MED ORDER — ACETAMINOPHEN 160 MG/5ML PO SOLN
650.0000 mg | Freq: Four times a day (QID) | ORAL | 0 refills | Status: AC | PRN
Start: 2021-12-06 — End: ?

## 2021-12-06 MED ORDER — ENSURE ENLIVE PO LIQD: 237.0000 mL | ORAL | 12 refills | Status: AC

## 2021-12-06 NOTE — Discharge Summary (Addendum)
DISCHARGE SUMMARY  Anthony Dudley  MR#: [id number]  DOB:1933-06-06  Date of Admission: 11/03/2021 Date of Discharge: 12/06/2021  Attending Physician:Press Casale Hennie Duos, MD  Patient's FMB:[id number], Beatriz Chancellor, MD  Consults: PCCM General Surgery Urology  Disposition: D/C to SNF   Follow-up Appts:  Contact information for follow-up providers     [facility] [place] Follow up.   Specialty: General Surgery Why: As needed for issues regarding feeding tube. Contact information: [address] [place] [address] [phone]              Contact information for after-discharge care     Destination     [facility] [place] [facility]erred [facility] .   Service: Skilled Nursing Contact information: [place] [phone]                     Issues Needing Follow-up: -wean midodrine to off over next 3-5 days  -ongoing intermittent SLP eval to determine if diet can safely be advanced  -continue wound care (as detailed below)  Diet: Full liquid   Wound Care: -The thoracic wound on the lower mid back needs a foam dressing, change every 3 days and assess daily. -Wound care to sacral/coccygeal unstageable pressure area. Cleanse with Soap and water, rinse and pat gently dry. Apply Zinc Oxide (Desitin) to the affected areas in a 1/8 inch layer around the unstageable wound. Apply Santyl to the wound coccyx wound in a nickel thick layer, cover with moistened saline gauze, dry gauze and secure with a sacral foam dressing. -Apply iodine from the swabsticks or swab pads from clean utility to right heel discolored area.  Allow to air dry. Then place foot back in Prevalon boot.  Discharge Diagnoses: Pyelonephritis w/ Sepsis and Septic Shock - resolved - related to chronic indwelling foley cath POA Acute hypoxic respiratory failure - resolved  Aspiration pneumonitis - resolved  Acute metabolic encephalopathy  superimposed on dementia - failure to thrive Persisting bradycardia with hypotension Metastatic prostate cancer AKI on CKD Transaminitis - resolved  Dysphagia Long standing chronic urinary retention Moderate malnutrition in context of chronic illness Possible ileus - ruled out clinically  Hyperglycemia - resolved  Acute on chronic anemia - macrocytosis  Decubitus ulcers-stage I toes, unstageable sacrum, stage II lumbar spine, DTI to the right heel  Initial presentation: 89yo with a history of dementia, legal blindness, FTT, and metastatic prostate cancer with B hydronephrosis who was admitted 12/21 with septic shock related to UTI.  During his hospital stay he underwent suprapubic catheter placement as well as B ureteral stent exchange on 12/29.  His postoperative stay was complicated by dysphagia which has limited his oral intake significantly.  On 1/10 he underwent PEG placement complicated by bradycardia hypotension and AMS postoperatively.  He required intubation pressors and IV fluids in the ICU.  He transiently required TPN, but has subsequently transitioned to tube feeds.  He has since improved, been extubated, and passed a swallowing study for intake of liquids only.   Hospital Course: 12/21 Admit with urosepsis, septic shock  12/25 to [facility]  12/29 Cystoscopy with B ureteral stent exchange and suprapubic catheter placement  1/03 [facility]CS consulted for PEG due to persistent dysphagia and poor intake  1/07 PICC placed for TPN  1/10 PEG placement per [facility] - postop hypotension and bradycardia - intubated by anesthesia 1/11 Extubated  1/13 Goals of care with [place] brother - full code - declined palliative care consult 1/14 Continues on  Levophed at 2 mcg - difficult to wean down further 11/15 Levophed changed to Neo-Synephrine - persistent bradycardia - stop TPN > tube feeds 1/16 Off levophed, passed swallow evaluation for full liquids  1/18 PICC line removed  Pyelonephritis w/ Sepsis and  Septic Shock - resolved  Required pressors initially - has completed antibiotic therapy - indefinite suprapubic catheter with management per Urology - stopped stress dose steroids 1/18 - stopped IVF - wean midodrine to off over next 3-5 days    Acute hypoxic respiratory failure - resolved  Felt to be due to sedation from procedure - required very brief ventilator support 1/10-1/11 - saturations stable on RA at time of d/c    Aspiration pneumonitis - resolved  Completed 5 days of Unasyn - no evidence of persisting pneumonitis   Acute metabolic encephalopathy superimposed on dementia - failure to thrive HCPOA insisted on full care/full code - disposition SNF placement - improvement in mental status beyond his current state is not expected    Persisting bradycardia with hypotension Has been evaluated previously by Cardiology and deemed not to be a candidate for pacemaker placement - hypotension appears to have resolved with simple volume resuscitation - HR stable at 67-80 at time of d/c    Metastatic prostate cancer Known widespread sclerotic bone metastases - being tx by Urology w/ ADT/enzalutamide - to resume prior tx at time of d/c    AKI on CKD baseline creatinine 2.2 - creatinine 6.08 at time of admission 11/03/2021 -creatinine improved to a nadir at approximately 1.5-1.6 and stabilized    Transaminitis - resolved  Etiology not clear - avoided meds which may cause or exacerbate this - LFTs have normalized at time of d/c    Dysphagia SLP followed w/ last re-eval on 1/18 - not yet ready to advance beyond full liquid diet    Long standing chronic urinary retention Now has suprapubic cath    Moderate malnutrition in context of chronic illness Continue tube feed support via PEG in addition to full liquid oral diet   Possible ileus - ruled out clinically  Noted on follow-up KUB to reconfirm G-tube position - no clinical evidence of this w/ normal exam and stool in fecal containment  pouch - resumed tube feeding and followed aspiration precautions w/ no complications    Hyperglycemia - resolved  Steroid induced - no longer an issue - pt is not diabetic - stopped CBG checks    Acute on chronic anemia - macrocytosis  Transfuse as necessary when hemoglobin drops below 7   Decubitus ulcers-stage I toes, unstageable sacrum, stage II lumbar spine, DTI to the right heel WOC following     Allergies as of 12/06/2021       Reactions   Solanum Lycopersicum [tomato]    Patient reports he is not allergic to tomato and he eats them all the time        Medication List     STOP taking these medications    famotidine 20 MG tablet Commonly known as: PEPCID   folic acid 1 MG tablet Commonly known as: FOLVITE   food thickener Liqd Commonly known as: SIMPLYTHICK (HONEY/LEVEL 3/MODERATELY THICK)   magnesium oxide 400 (240 Mg) MG tablet Commonly known as: MAG-OX   multivitamin with minerals Tabs tablet   oxybutynin 5 MG tablet Commonly known as: DITROPAN   PROSTAT PO   vitamin B-12 1000 MCG tablet Commonly known as: CYANOCOBALAMIN   Vitamin D-3 125 MCG (5000 UT) Tabs  TAKE these medications    abiraterone acetate 250 MG tablet Commonly known as: ZYTIGA Take 1,000 mg by mouth daily.   acetaminophen 160 MG/5ML solution Commonly known as: TYLENOL Place 20.3 mLs (650 mg total) into feeding tube every 6 (six) hours as needed for mild pain, headache or fever.   collagenase ointment Commonly known as: SANTYL Apply topically daily. Start taking on: December 07, 2021   feeding supplement (PROSource TF) liquid Place 45 mLs into feeding tube 3 (three) times daily. What changed: when to take this   feeding supplement (OSMOLITE 1.5 CAL) Liqd Place 1,000 mLs into feeding tube daily. What changed: You were already taking a medication with the same name, and this prescription was added. Make sure you understand how and when to take each.   feeding  supplement Liqd Take 237 mLs by mouth daily. Start taking on: December 07, 2021 What changed: You were already taking a medication with the same name, and this prescription was added. Make sure you understand how and when to take each.   free water Soln Place 300 mLs into feeding tube every 4 (four) hours. What changed: how much to take   liver oil-zinc oxide 40 % ointment Commonly known as: DESITIN Apply topically as needed for irritation.   midodrine 2.5 MG tablet Commonly known as: PROAMATINE Place 1 tablet (2.5 mg total) into feeding tube with breakfast, with lunch, and with evening meal.   mirtazapine 7.5 MG tablet Commonly known as: REMERON Place 1 tablet (7.5 mg total) into feeding tube at bedtime. What changed: how to take this   multivitamin Liqd Place 15 mLs into feeding tube daily. Start taking on: December 07, 2021   pantoprazole sodium 40 mg Commonly known as: PROTONIX Place 40 mg into feeding tube daily.        Day of Discharge BP 122/61 (BP Location: Left Arm)    Pulse 81    Temp 98.5 F (36.9 C)    Resp (!) 22    Ht 5\' 9"  (1.753 m)    Wt 87 kg    SpO2 99%    BMI 28.32 kg/m   Physical Exam: General: No acute respiratory distress Lungs: Clear to auscultation bilaterally without wheezes or crackles Cardiovascular: Regular rate and rhythm without murmur gallop or rub normal S1 and S2 Abdomen: Nontender, nondistended, soft, bowel sounds positive, suprapubic tube and PEG intact w/ benign insertion points  Extremities: No significant cyanosis, clubbing, or edema bilateral lower extremities  Basic Metabolic Panel: Recent Labs  Lab 11/30/21 0415 12/01/21 0500 12/03/21 0406 12/04/21 0431 12/06/21 0424  NA 140 137 138 140 140  K 4.3 5.0 3.3* 3.5 3.6  CL 113* 109 113* 115* 113*  CO2 22 22 18* 17* 19*  GLUCOSE 131* 107* 107* 78 111*  BUN 63* 66* 58* 40* 38*  CREATININE 1.63* 1.48* 1.62* 1.58* 1.44*  CALCIUM 7.9* 8.1* 7.6* 7.8* 8.0*    Liver Function  Tests: Recent Labs  Lab 12/03/21 0406 12/06/21 0424  AST 27 18  ALT 61* 34  ALKPHOS 226* 202*  BILITOT 0.2* 0.4  PROT 5.6* 6.1*  ALBUMIN 2.3* 2.4*    CBC: Recent Labs  Lab 11/30/21 0415 12/01/21 0500 12/03/21 0406 12/04/21 0431 12/06/21 0424  WBC 12.0* 13.7* 21.0* 16.1* 9.9  HGB 7.6* 7.6* 7.9* 7.7* 8.1*  HCT 24.8* 24.6* 26.0* 25.1* 25.6*  MCV 104.6* 103.8* 107.0* 106.4* 103.2*  PLT 249 257 251 226 273    Time spent in discharge (includes decision  making & examination of pt): 35 minutes  12/06/2021, 1:03 PM   Cherene Altes, MD [facility] Office  [phone]

## 2021-12-06 NOTE — Consult Note (Signed)
[place] Nurse wound follow up  Wound type:  Unstageable to coccyx DTPI to right heel.   Thoracic back wound healed. Measurement: coccyx wound measures 2.6 cm x 2.4 cm x unknown depth; wound bed is 100% yellow  Right heel DTPI measures 5 cm x 6 cm and is intact and maroon/purple. Drainage (amount, consistency, odor) none Periwound: intact  Dressing procedure/placement/frequency: Apply iodine from the swabsticks or swab pads from clean utility to right heel discolored area.  Allow to air dry. Then place foot back in Prevalon boot.  Continue the santyl for the coccyx wound, and protective foam dressing to healed thoracic back area. Low air loss mattress in place for moisture management and pressure redistribution.   [facility] Nurse team will follow with you and see patient within 10 days for wound assessments.  Please notify [facility] nurses of any acute changes in the wounds or any new areas of concern [place] MSN, [place], [place], [place]

## 2021-12-06 NOTE — Plan of Care (Signed)
  Problem: Education: Goal: Knowledge of General Education information will improve Description Including pain rating scale, medication(s)/side effects and non-pharmacologic comfort measures Outcome: Progressing   

## 2021-12-06 NOTE — TOC Transition Note (Signed)
Transition of Care [facility]) - CM/SW Discharge Note   Patient Details  Name: Anthony Dudley MRN: [id number] Date of Birth: [birth year]  [facility]) CM/SW Contact:  Dessa Phi, RN Phone Number: 12/06/2021, 11:59 AM   Clinical Narrative: Return back to [facility] rep [place] aware.awaiting d/c summary,covid results. Prior calling [facility].      Final next level of care: Long Term Nursing Home Barriers to Discharge: No Barriers Identified   Patient Goals and CMS Choice Patient states their goals for this hospitalization and ongoing recovery are:: return back to [facility] CMS Medicare.gov Compare Post Acute Care list provided to:: Patient Represenative (must comment) (Ben(brother) Ben Jr(son)) Choice offered to / list presented to : Sibling, Adult Children  Discharge Placement              Patient chooses bed at: [facility] [place] Patient to be transferred to facility by: [place] Name of family member notified: Suezanne Jacquet Patient and family notified of of transfer: 12/06/21  Discharge Plan and Services                                     Social Determinants of Health (SDOH) Interventions     Readmission Risk Interventions No flowsheet data found.

## 2021-12-06 NOTE — Plan of Care (Signed)
  Problem: Clinical Measurements: Goal: Respiratory complications will improve Outcome: Progressing   Problem: Nutrition: Goal: Adequate nutrition will be maintained Outcome: Progressing   

## 2021-12-06 NOTE — Progress Notes (Signed)
Nutrition Follow-up  DOCUMENTATION CODES:   Non-severe (moderate) malnutrition in context of chronic illness  INTERVENTION:  - continue Osmolite 1.5 @ 55 ml/hr x24 hours/day with 45 ml Prosource TF TID. - free water flush per MD. - will decrease Ensure Enlive from BID to once/day. - continue Mighty Shake TID.    NUTRITION DIAGNOSIS:   Moderate Malnutrition related to chronic illness, cancer and cancer related treatments as evidenced by mild fat depletion, moderate muscle depletion. -ongoing  GOAL:   Patient will meet greater than or equal to 90% of their needs -met with TF regimen and minimal PO intakes.  MONITOR:   PO intake, Supplement acceptance, TF tolerance, Labs, Weight trends, Skin   ASSESSMENT:   88 yo male with medical history of CKD, HTN, heart failure, dementia, and prostate cancer. Of note, he was hospitalized in 08/2021 with sepsis and AKI on CKD 2/2 obstruction related to metastatic prostate cancer. Patient was admitted for septic shock 2/2 UTI.  Significant Events: 12/22- admission 12/31- initial RD assessment for Calorie Count 1/7- double lumen PICC placed in R basilic; TPN initiation 7/10- 24 F G-tube placed by Surgery; Rapid Response, Code Blue, intubation 1/11- extubation; Osmolite 1.5 started at 20 ml/hr via G-tube 1/12- TF regimen changed from x14 hours/day to x24 hours/day due to patient remaining NPO 1/13- TPN decreased to 40 ml/hr 1/14- TPN stopped 1/16- SLP re-evaluation and diet advanced to FLD, thin liquids   Patient noted to be a/o to self only. Flow sheet documentation indicates he consumed 0% of breakfast on 1/19 and 0% of all meals on 1/20.  Patient laying in bed with RN at bedside. She reports that patient took 1 sip of soda and 1 sip of another beverage this AM. Two unopened cartons of Mighty Shake on bedside table. Patient was drinking these well over ice while on 2W; communicated this with RN.   He has G-tube in place and is receiving  Osmolite 1.5 @ 55 ml/hr x24 hours/day with 45 ml Prosource TF TID. Free water flush order of 300 ml every 4 hours was added on 1/19. This regimen is providing 2100 kcal, 116 grams protein, and 2808 ml free water.   Weight yesterday was stable since 1/19 and mainly stable since 1/16. Non-pitting edema to all extremities documented in the edema section of flow sheet from the past 2 days. He is noted to be +7.4 L since 11/22/21.   Labs reviewed; CBG: 74 mg/dl, Cl: 113 mmol/l, BUN: 38 mg/dl, creatinine: 1.44 mg/dl, Ca: 8 mg/dl, Alk Phos elevated but trending down, GFR: 47 ml/min.   Medications reviewed; 15 ml multivitamin per G-tube/day, 40 mg protonix/day.    Diet Order:   Diet Order             Diet full liquid Room service appropriate? Yes; Fluid consistency: Thin  Diet effective now                   EDUCATION NEEDS:   Not appropriate for education at this time  Skin:  Skin Assessment: Skin Integrity Issues: Skin Integrity Issues:: Stage III, DTI, Stage II, Stage I DTI: R heel Stage I: R toe Stage II: vertebral column Stage III: sacrum  Last BM:  1/22 (type 7 x2)  Height:   Ht Readings from Last 1 Encounters:  11/23/21 '5\' 9"'  (1.753 m)    Weight:   Wt Readings from Last 1 Encounters:  12/05/21 87 kg     Estimated Nutritional Needs:  Kcal:  2100-2300 kcal Protein:  105-120 grams Fluid:  >/= 2.3 L/day     Jarome Matin, MS, RD, LDN Inpatient Clinical Dietitian RD pager # available in [place]  After hours/weekend pager # available in [facility]

## 2021-12-06 NOTE — Progress Notes (Signed)
Report called to facility, Christine-LPN.  [facility] called to check when patient can be picked up but was not on the list, request made for patient transportation to facility.

## 2021-12-06 NOTE — Progress Notes (Signed)
Discharged per [facility] to [facility].

## 2022-02-11 ENCOUNTER — Encounter (HOSPITAL_COMMUNITY): Payer: Self-pay | Admitting: Radiology

## 2022-07-15 DEATH — deceased

## 2022-12-28 IMAGING — DX DG ABDOMEN 1V
1 series · 2 of 2 positions shown · non-contrast
Comparison: None.

CLINICAL DATA: Coccidiomycosis.  Feeding tube.

EXAM:
ABDOMEN - 1 VIEW

[Series 1: abdomen · 0.14mm/px · 2 of 2 slices shown]
[im 1/2]
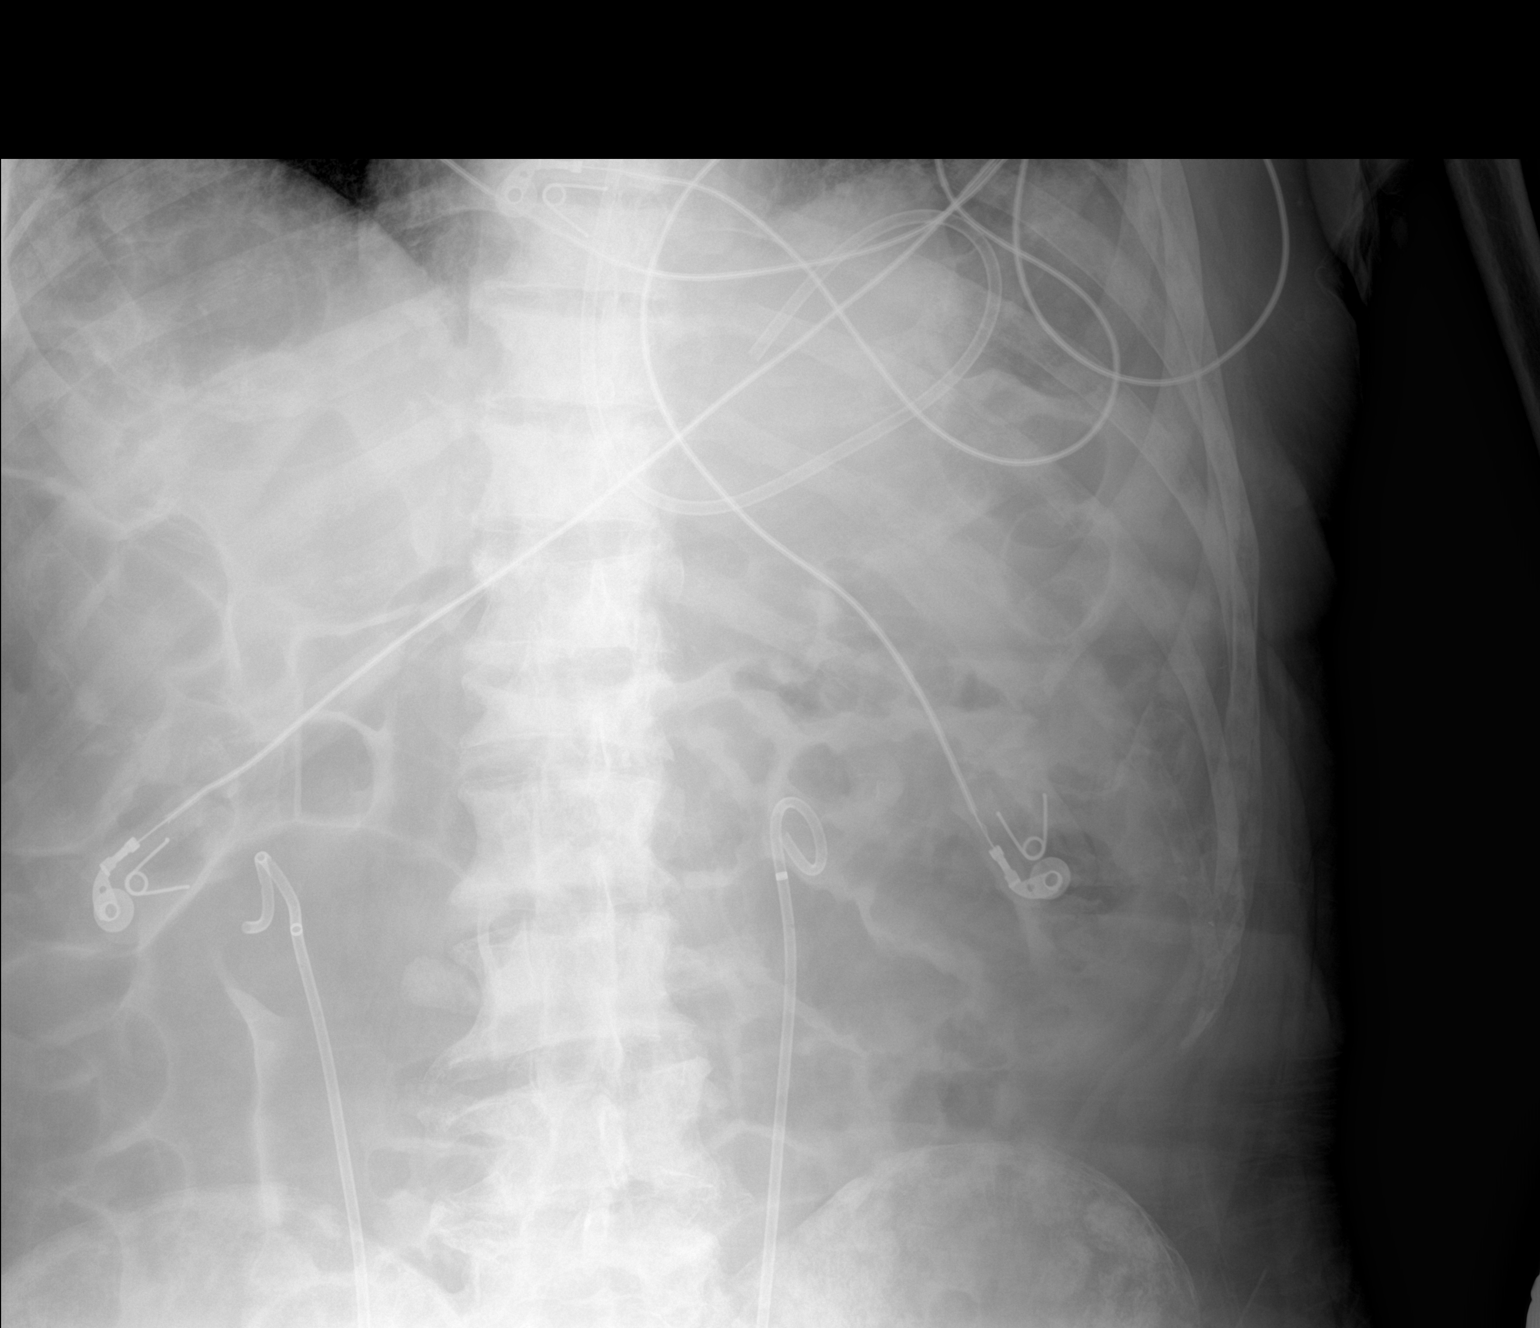
[im 2/2]
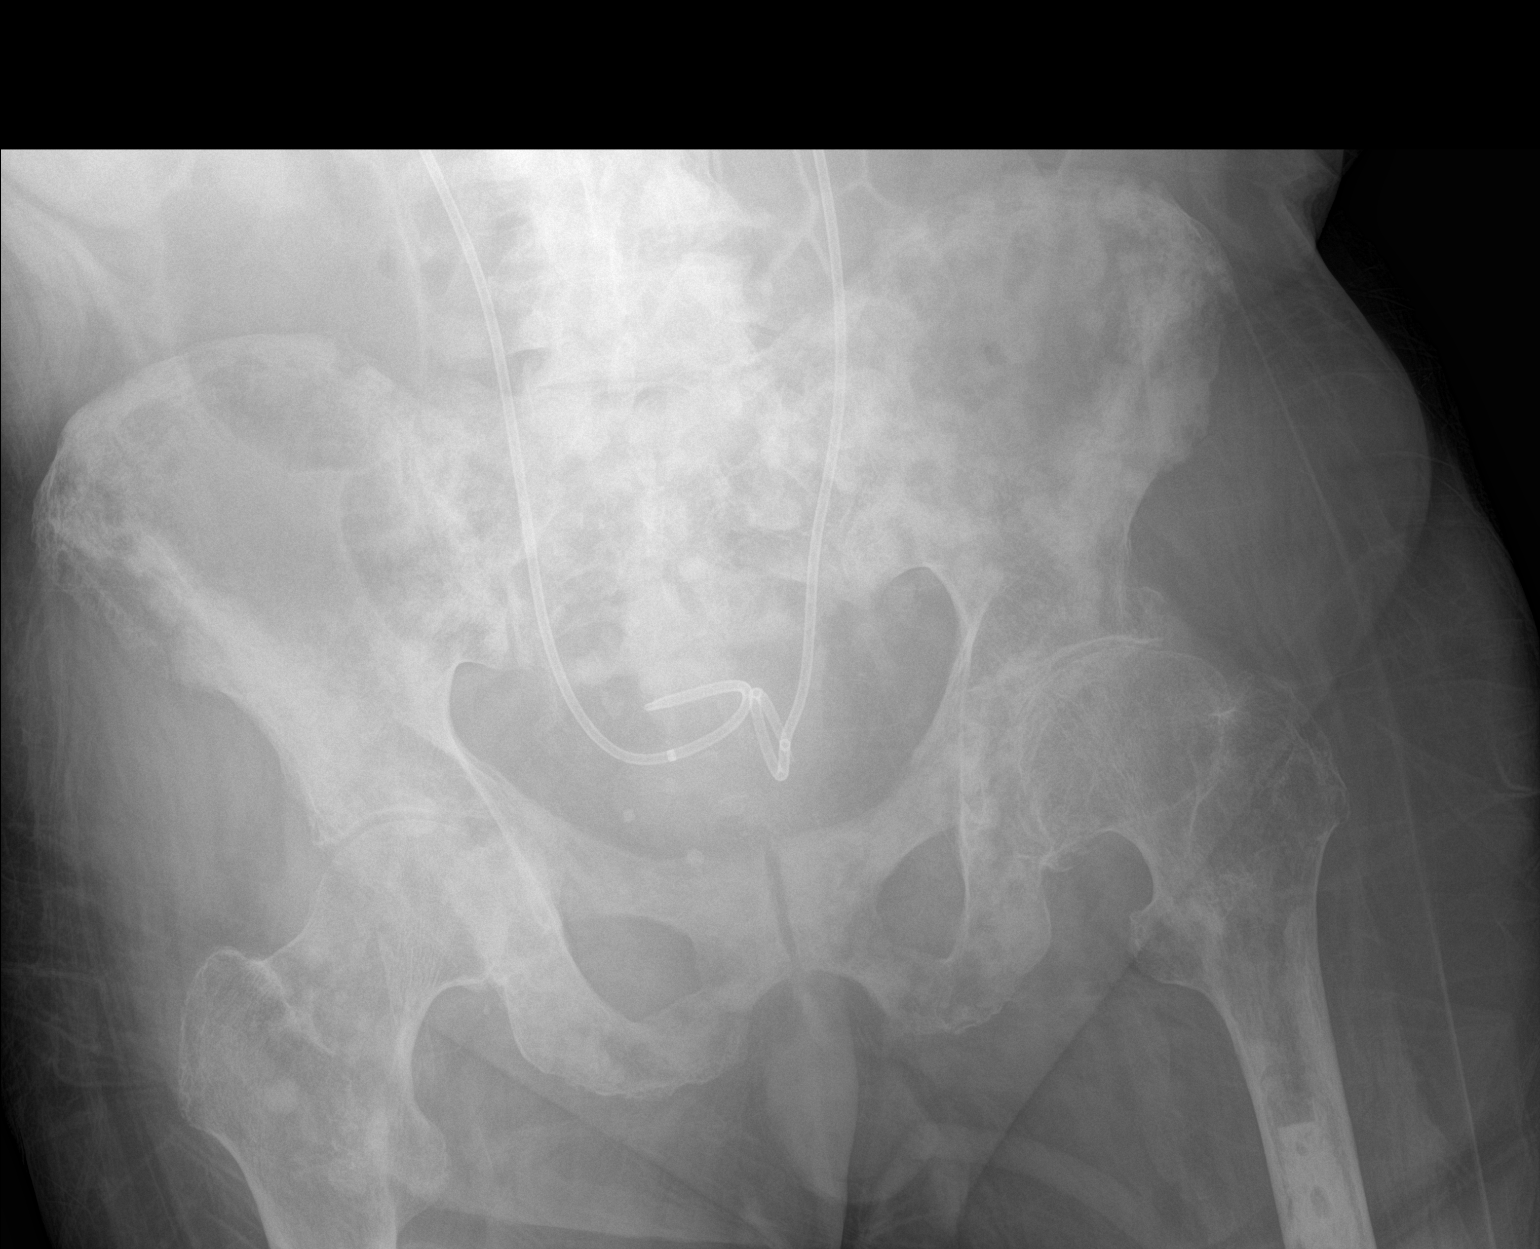

[2 of 2 positions shown; findings below may reference images not displayed]

FINDINGS: Partially visualized feeding tube with tip in the left upper
abdomen, likely in the region of the gastric fundus. Bilateral
ureteral stents with proximal tips in the region of the renal pelvis
and distal end over the urinary bladder. Nonspecific bowel gas
pattern. Extensive osseous sclerotic changes consistent with
metastatic disease.
IMPRESSION: Feeding tube tip in the left upper abdomen.

## 2022-12-28 IMAGING — XA IR NEPHRO TUBE REMOV/FLUORO
6 series · 8 of 8 positions shown · non-contrast
Comparison: None.

INDICATION: 88-year-old gentleman with history of metastatic prostate cancer and
bilateral obstructive uropathy initially underwent bilateral
parenchyma nephrostomy drain placement on 08/19/2021 followed by
ureteral stent placement on 08/31/2021. His drains have been clamped
since that time and he returns today for nephrostogram and possible
drain removal.

EXAM:
Bilateral antegrade nephrostogram and nephrostomy drain removal.

[Series 1: fl (-) angio · 1 of 1 slices shown (1 of 3)]
[im 1/1]
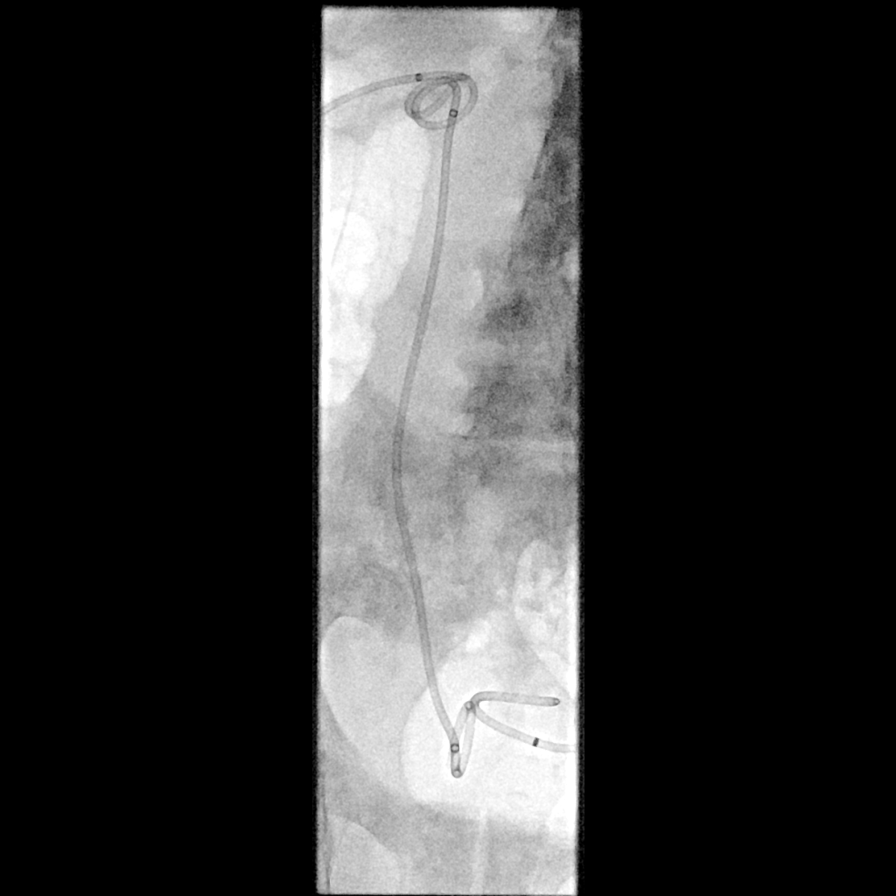

[Series 2: fl (-) angio · 1 of 1 slices shown (2 of 3)]
[im 1/1]
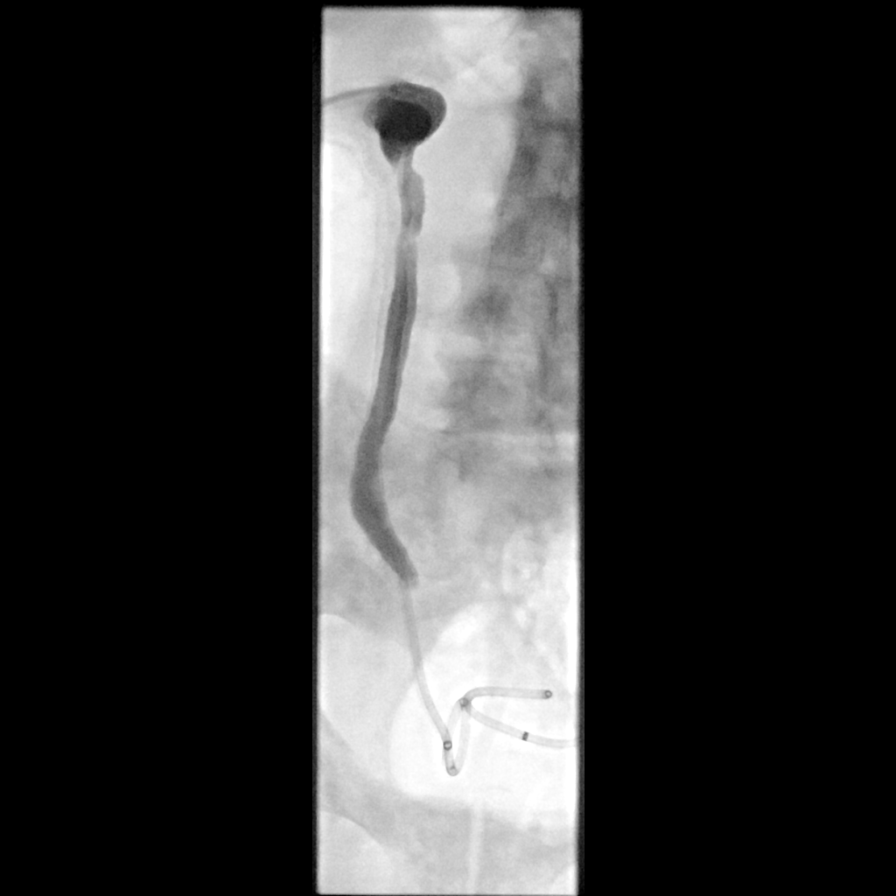

[Series 3: single · 1 of 1 slices shown (1 of 3)]
[im 1/1]
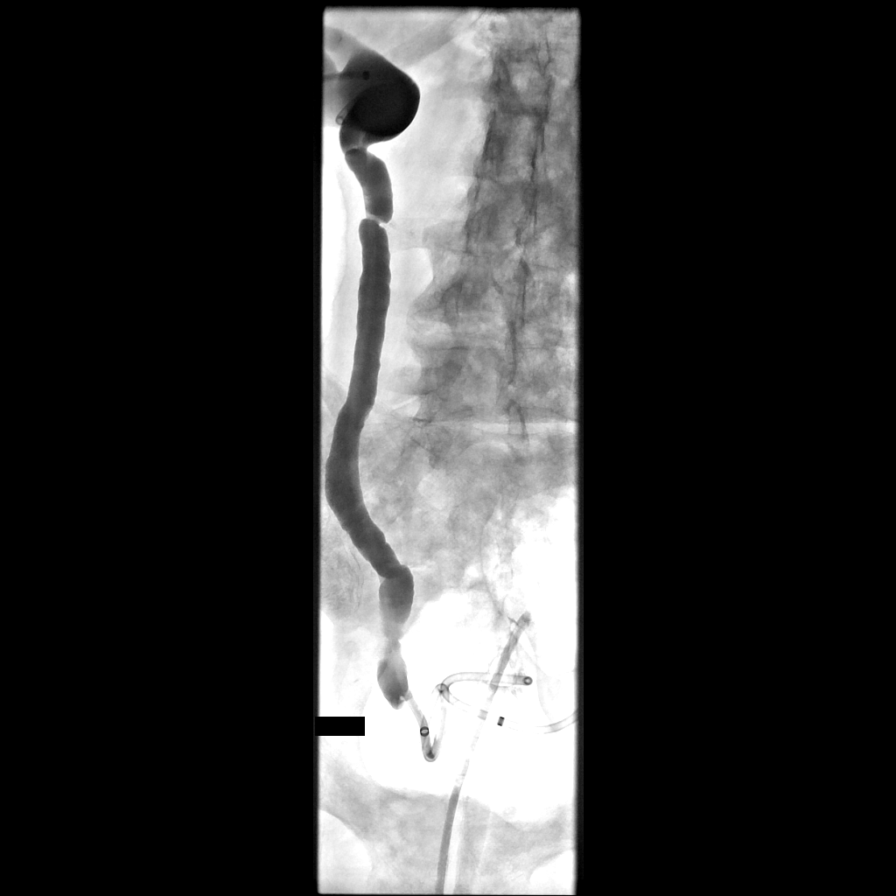

[Series 4: fl (-) angio · 3 of 3 slices shown (3 of 3)]
[im 1/3]
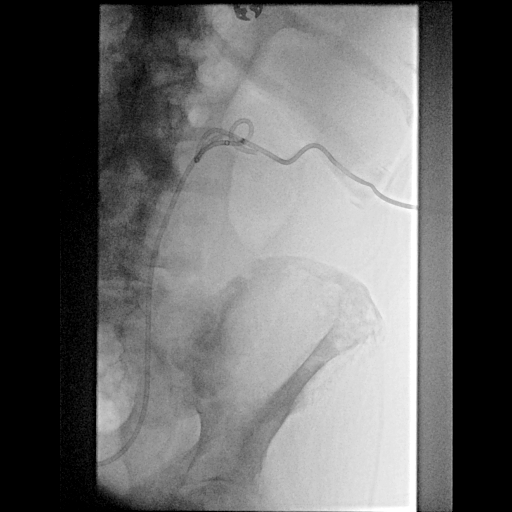
[im 2/3]
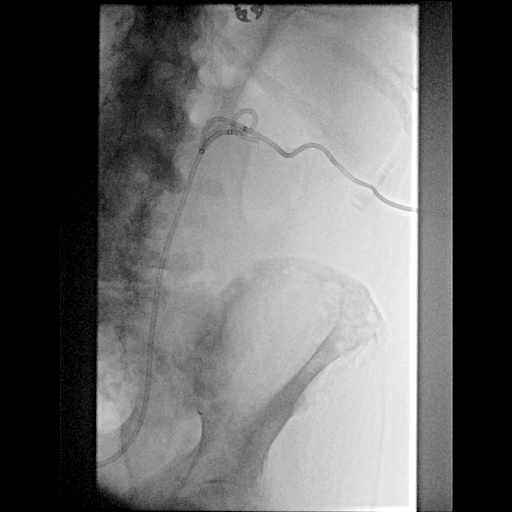
[im 3/3]
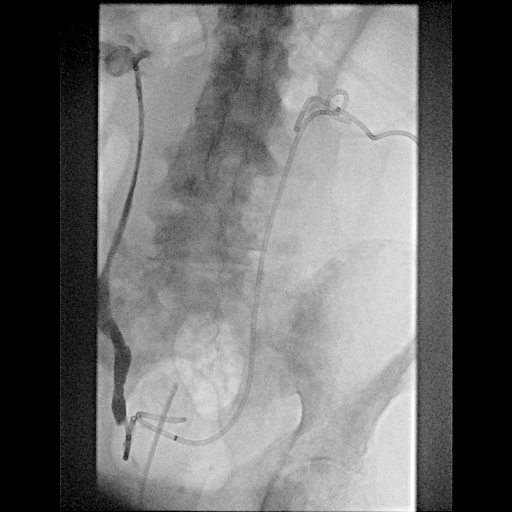

[Series 5: single · 1 of 1 slices shown (2 of 3)]
[im 1/1]
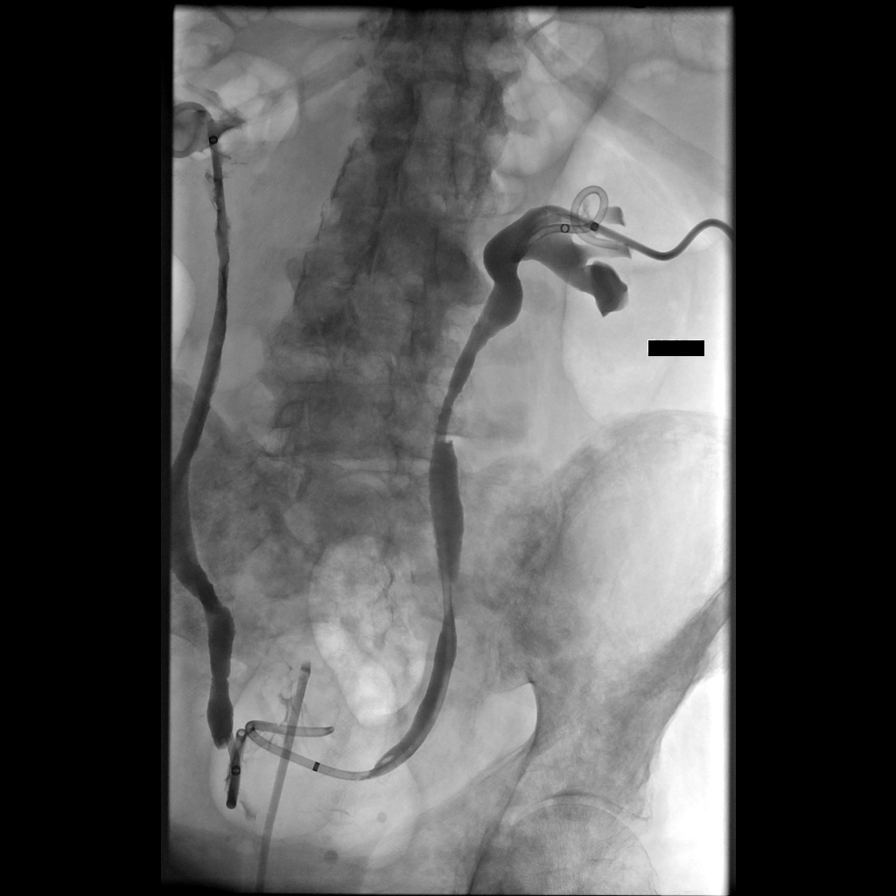

[Series 6: single · 1 of 1 slices shown (3 of 3)]
[im 1/1]
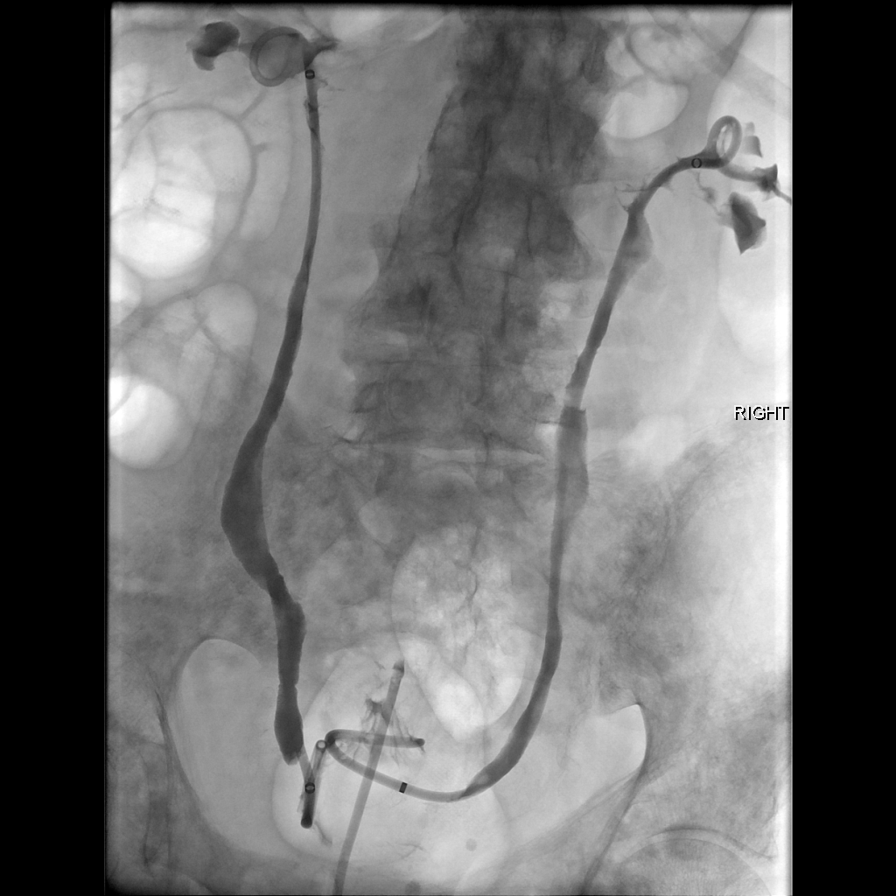

[8 of 8 positions shown; findings below may reference images not displayed]

MEDICATIONS:
None

ANESTHESIA/SEDATION:
None

CONTRAST:  30 mL of Omnipaque 300-administered into the collecting
system(s)

FLUOROSCOPY TIME:  Fluoroscopy Time: 1 minutes 24 seconds (10 mGy).

COMPLICATIONS:
None immediate.

PROCEDURE:
Informed written consent was obtained from the patient after a
thorough discussion of the procedural risks, benefits and
alternatives. All questions were addressed. A timeout was performed
prior to the initiation of the procedure.

Patient position prone on the procedure table. Scout image showed
the nephrostomy drains in appropriate position.

Left antegrade nephrostogram showed patent ureteral stent. The drain
was cut and removed over a guidewire. Guidewire removed when no
hemorrhage seen along catheter tract.

Right antegrade nephrostogram showed patent ureteral stent. The
drain was cut and removed over a guidewire. Guidewire removed when
no hemorrhage seen along catheter tract.

Both insertion sites covered with sterile dressing.
IMPRESSION: Bilateral antegrade nephrostogram demonstrates patent ureteral
stents. Percutaneous nephrostomy drains removed.

PLAN:
Patient should follow-up with Urology for ureteral stent exchange.
